# Patient Record
Sex: Female | Born: 1955 | Race: White | Hispanic: No | Smoking: Current some day smoker
Health system: Southern US, Community
[De-identification: ages and names within clinical notes are randomized; demographics above are authoritative.]

## PROBLEM LIST (undated history)

## (undated) DIAGNOSIS — J449 Chronic obstructive pulmonary disease, unspecified: Secondary | ICD-10-CM

## (undated) DIAGNOSIS — K219 Gastro-esophageal reflux disease without esophagitis: Secondary | ICD-10-CM

## (undated) DIAGNOSIS — M797 Fibromyalgia: Secondary | ICD-10-CM

## (undated) DIAGNOSIS — M069 Rheumatoid arthritis, unspecified: Secondary | ICD-10-CM

## (undated) DIAGNOSIS — I219 Acute myocardial infarction, unspecified: Secondary | ICD-10-CM

## (undated) DIAGNOSIS — M1990 Unspecified osteoarthritis, unspecified site: Secondary | ICD-10-CM

## (undated) DIAGNOSIS — R413 Other amnesia: Secondary | ICD-10-CM

## (undated) DIAGNOSIS — E785 Hyperlipidemia, unspecified: Secondary | ICD-10-CM

## (undated) DIAGNOSIS — M810 Age-related osteoporosis without current pathological fracture: Secondary | ICD-10-CM

## (undated) DIAGNOSIS — I2510 Atherosclerotic heart disease of native coronary artery without angina pectoris: Secondary | ICD-10-CM

## (undated) DIAGNOSIS — M5481 Occipital neuralgia: Secondary | ICD-10-CM

## (undated) DIAGNOSIS — G9340 Encephalopathy, unspecified: Secondary | ICD-10-CM

## (undated) DIAGNOSIS — J45909 Unspecified asthma, uncomplicated: Secondary | ICD-10-CM

## (undated) DIAGNOSIS — F4310 Post-traumatic stress disorder, unspecified: Secondary | ICD-10-CM

## (undated) DIAGNOSIS — F419 Anxiety disorder, unspecified: Secondary | ICD-10-CM

## (undated) DIAGNOSIS — M47816 Spondylosis without myelopathy or radiculopathy, lumbar region: Secondary | ICD-10-CM

## (undated) DIAGNOSIS — I10 Essential (primary) hypertension: Secondary | ICD-10-CM

## (undated) DIAGNOSIS — S72031A Displaced midcervical fracture of right femur, initial encounter for closed fracture: Secondary | ICD-10-CM

## (undated) DIAGNOSIS — R0600 Dyspnea, unspecified: Secondary | ICD-10-CM

## (undated) DIAGNOSIS — F32A Depression, unspecified: Secondary | ICD-10-CM

## (undated) DIAGNOSIS — G894 Chronic pain syndrome: Secondary | ICD-10-CM

## (undated) DIAGNOSIS — T7840XA Allergy, unspecified, initial encounter: Secondary | ICD-10-CM

## (undated) DIAGNOSIS — F329 Major depressive disorder, single episode, unspecified: Secondary | ICD-10-CM

## (undated) DIAGNOSIS — M7918 Myalgia, other site: Secondary | ICD-10-CM

## (undated) HISTORY — DX: Major depressive disorder, single episode, unspecified: F32.9

## (undated) HISTORY — DX: Anxiety disorder, unspecified: F41.9

## (undated) HISTORY — DX: Allergy, unspecified, initial encounter: T78.40XA

## (undated) HISTORY — PX: CERVICAL DISC SURGERY: SHX588

## (undated) HISTORY — DX: Hyperlipidemia, unspecified: E78.5

## (undated) HISTORY — DX: Unspecified osteoarthritis, unspecified site: M19.90

## (undated) HISTORY — DX: Depression, unspecified: F32.A

## (undated) HISTORY — DX: Essential (primary) hypertension: I10

## (undated) HISTORY — PX: SPINE SURGERY: SHX786

---

## 1981-10-09 HISTORY — PX: ABDOMINAL HYSTERECTOMY: SHX81

## 1981-10-09 HISTORY — PX: CHOLECYSTECTOMY: SHX55

## 2017-12-14 DIAGNOSIS — F331 Major depressive disorder, recurrent, moderate: Secondary | ICD-10-CM | POA: Insufficient documentation

## 2017-12-14 DIAGNOSIS — M797 Fibromyalgia: Secondary | ICD-10-CM | POA: Insufficient documentation

## 2017-12-14 DIAGNOSIS — I10 Essential (primary) hypertension: Secondary | ICD-10-CM | POA: Insufficient documentation

## 2017-12-14 DIAGNOSIS — M069 Rheumatoid arthritis, unspecified: Secondary | ICD-10-CM | POA: Insufficient documentation

## 2017-12-14 DIAGNOSIS — M0579 Rheumatoid arthritis with rheumatoid factor of multiple sites without organ or systems involvement: Secondary | ICD-10-CM | POA: Insufficient documentation

## 2017-12-14 DIAGNOSIS — E782 Mixed hyperlipidemia: Secondary | ICD-10-CM | POA: Insufficient documentation

## 2017-12-14 DIAGNOSIS — M542 Cervicalgia: Secondary | ICD-10-CM | POA: Insufficient documentation

## 2017-12-14 DIAGNOSIS — F4310 Post-traumatic stress disorder, unspecified: Secondary | ICD-10-CM | POA: Insufficient documentation

## 2017-12-25 ENCOUNTER — Ambulatory Visit: Payer: Medicare Other | Admitting: Nurse Practitioner

## 2018-01-02 ENCOUNTER — Ambulatory Visit: Payer: Medicare Other | Attending: Nurse Practitioner | Admitting: Nurse Practitioner

## 2018-01-02 ENCOUNTER — Encounter: Payer: Self-pay | Admitting: Nurse Practitioner

## 2018-01-02 ENCOUNTER — Other Ambulatory Visit: Payer: Self-pay

## 2018-01-02 VITALS — BP 180/94 | HR 65 | Temp 98.1°F | Resp 16 | Ht 65.0 in | Wt 167.8 lb

## 2018-01-02 DIAGNOSIS — R52 Pain, unspecified: Secondary | ICD-10-CM | POA: Diagnosis not present

## 2018-01-02 DIAGNOSIS — Z79899 Other long term (current) drug therapy: Secondary | ICD-10-CM

## 2018-01-02 DIAGNOSIS — F339 Major depressive disorder, recurrent, unspecified: Secondary | ICD-10-CM | POA: Insufficient documentation

## 2018-01-02 DIAGNOSIS — Z888 Allergy status to other drugs, medicaments and biological substances status: Secondary | ICD-10-CM | POA: Diagnosis not present

## 2018-01-02 DIAGNOSIS — M797 Fibromyalgia: Secondary | ICD-10-CM | POA: Diagnosis not present

## 2018-01-02 DIAGNOSIS — Z79891 Long term (current) use of opiate analgesic: Secondary | ICD-10-CM

## 2018-01-02 DIAGNOSIS — M25561 Pain in right knee: Secondary | ICD-10-CM | POA: Diagnosis present

## 2018-01-02 DIAGNOSIS — M546 Pain in thoracic spine: Secondary | ICD-10-CM | POA: Diagnosis not present

## 2018-01-02 DIAGNOSIS — M545 Low back pain: Secondary | ICD-10-CM | POA: Insufficient documentation

## 2018-01-02 DIAGNOSIS — E785 Hyperlipidemia, unspecified: Secondary | ICD-10-CM | POA: Insufficient documentation

## 2018-01-02 DIAGNOSIS — M069 Rheumatoid arthritis, unspecified: Secondary | ICD-10-CM | POA: Insufficient documentation

## 2018-01-02 DIAGNOSIS — M542 Cervicalgia: Secondary | ICD-10-CM | POA: Insufficient documentation

## 2018-01-02 DIAGNOSIS — F17210 Nicotine dependence, cigarettes, uncomplicated: Secondary | ICD-10-CM | POA: Diagnosis not present

## 2018-01-02 DIAGNOSIS — M25532 Pain in left wrist: Secondary | ICD-10-CM | POA: Insufficient documentation

## 2018-01-02 DIAGNOSIS — M899 Disorder of bone, unspecified: Secondary | ICD-10-CM

## 2018-01-02 DIAGNOSIS — M5481 Occipital neuralgia: Secondary | ICD-10-CM

## 2018-01-02 DIAGNOSIS — I129 Hypertensive chronic kidney disease with stage 1 through stage 4 chronic kidney disease, or unspecified chronic kidney disease: Secondary | ICD-10-CM | POA: Insufficient documentation

## 2018-01-02 DIAGNOSIS — M25531 Pain in right wrist: Secondary | ICD-10-CM | POA: Insufficient documentation

## 2018-01-02 DIAGNOSIS — F4310 Post-traumatic stress disorder, unspecified: Secondary | ICD-10-CM | POA: Insufficient documentation

## 2018-01-02 DIAGNOSIS — Z7982 Long term (current) use of aspirin: Secondary | ICD-10-CM | POA: Diagnosis not present

## 2018-01-02 DIAGNOSIS — R51 Headache: Secondary | ICD-10-CM | POA: Insufficient documentation

## 2018-01-02 DIAGNOSIS — G894 Chronic pain syndrome: Secondary | ICD-10-CM | POA: Diagnosis not present

## 2018-01-02 DIAGNOSIS — G8929 Other chronic pain: Secondary | ICD-10-CM

## 2018-01-02 DIAGNOSIS — Z789 Other specified health status: Secondary | ICD-10-CM | POA: Diagnosis not present

## 2018-01-02 DIAGNOSIS — M549 Dorsalgia, unspecified: Secondary | ICD-10-CM

## 2018-01-02 NOTE — Progress Notes (Signed)
Safety precautions to be maintained throughout the outpatient stay will include: orient to surroundings, keep bed in low position, maintain call bell within reach at all times, provide assistance with transfer out of bed and ambulation.  

## 2018-01-02 NOTE — Patient Instructions (Addendum)
You must complete med/psych evaluation prior to next appointment with pain clinic.____________________________________________________________________________________________  Appointment Policy Summary  It is our goal and responsibility to provide the medical community with assistance in the evaluation and management of patients with chronic pain. Unfortunately our resources are limited. Because we do not have an unlimited amount of time, or available appointments, we are required to closely monitor and manage their use. The following rules exist to maximize their use:  Patient's responsibilities: 1. Punctuality:  At what time should I arrive? You should be physically present in our office 30 minutes before your scheduled appointment. Your scheduled appointment is with your assigned healthcare provider. However, it takes 5-10 minutes to be "checked-in", and another 15 minutes for the nurses to do the admission. If you arrive to our office at the time you were given for your appointment, you will end up being at least 20-25 minutes late to your appointment with the provider. 2. Tardiness:  What happens if I arrive only a few minutes after my scheduled appointment time? You will need to reschedule your appointment. The cutoff is your appointment time. This is why it is so important that you arrive at least 30 minutes before that appointment. If you have an appointment scheduled for 10:00 AM and you arrive at 10:01, you will be required to reschedule your appointment.  3. Plan ahead:  Always assume that you will encounter traffic on your way in. Plan for it. If you are dependent on a driver, make sure they understand these rules and the need to arrive early. 4. Other appointments and responsibilities:  Avoid scheduling any other appointments before or after your pain clinic appointments.  5. Be prepared:  Write down everything that you need to discuss with your healthcare provider and give this  information to the admitting nurse. Write down the medications that you will need refilled. Bring your pills and bottles (even the empty ones), to all of your appointments, except for those where a procedure is scheduled. 6. No children or pets:  Find someone to take care of them. It is not appropriate to bring them in. 7. Scheduling changes:  We request "advanced notification" of any changes or cancellations. 8. Advanced notification:  Defined as a time period of more than 24 hours prior to the originally scheduled appointment. This allows for the appointment to be offered to other patients. 9. Rescheduling:  When a visit is rescheduled, it will require the cancellation of the original appointment. For this reason they both fall within the category of "Cancellations".  10. Cancellations:  They require advanced notification. Any cancellation less than 24 hours before the  appointment will be recorded as a "No Show". 11. No Show:  Defined as an unkept appointment where the patient failed to notify or declare to the practice their intention or inability to keep the appointment.  Corrective process for repeat offenders:  1. Tardiness: Three (3) episodes of rescheduling due to late arrivals will be recorded as one (1) "No Show". 2. Cancellation or reschedule: Three (3) cancellations or rescheduling will be recorded as one (1) "No Show". 3. "No Shows": Three (3) "No Shows" within a 12 month period will result in discharge from the practice. ____________________________________________________________________________________________  ____________________________________________________________________________________________  Pain Scale  Introduction: The pain score used by this practice is the Verbal Numerical Rating Scale (VNRS-11). This is an 11-point scale. It is for adults and children 10 years or older. There are significant differences in how the pain score is reported, used,  and applied.  Forget everything you learned in the past and learn this scoring system.  General Information: The scale should reflect your current level of pain. Unless you are specifically asked for the level of your worst pain, or your average pain. If you are asked for one of these two, then it should be understood that it is over the past 24 hours.  Basic Activities of Daily Living (ADL): Personal hygiene, dressing, eating, transferring, and using restroom.  Instructions: Most patients tend to report their level of pain as a combination of two factors, their physical pain and their psychosocial pain. This last one is also known as "suffering" and it is reflection of how physical pain affects you socially and psychologically. From now on, report them separately. From this point on, when asked to report your pain level, report only your physical pain. Use the following table for reference.  Pain Clinic Pain Levels (0-5/10)  Pain Level Score  Description  No Pain 0   Mild pain 1 Nagging, annoying, but does not interfere with basic activities of daily living (ADL). Patients are able to eat, bathe, get dressed, toileting (being able to get on and off the toilet and perform personal hygiene functions), transfer (move in and out of bed or a chair without assistance), and maintain continence (able to control bladder and bowel functions). Blood pressure and heart rate are unaffected. A normal heart rate for a healthy adult ranges from 60 to 100 bpm (beats per minute).   Mild to moderate pain 2 Noticeable and distracting. Impossible to hide from other people. More frequent flare-ups. Still possible to adapt and function close to normal. It can be very annoying and may have occasional stronger flare-ups. With discipline, patients may get used to it and adapt.   Moderate pain 3 Interferes significantly with activities of daily living (ADL). It becomes difficult to feed, bathe, get dressed, get on and off the toilet or to  perform personal hygiene functions. Difficult to get in and out of bed or a chair without assistance. Very distracting. With effort, it can be ignored when deeply involved in activities.   Moderately severe pain 4 Impossible to ignore for more than a few minutes. With effort, patients may still be able to manage work or participate in some social activities. Very difficult to concentrate. Signs of autonomic nervous system discharge are evident: dilated pupils (mydriasis); mild sweating (diaphoresis); sleep interference. Heart rate becomes elevated (>115 bpm). Diastolic blood pressure (lower number) rises above 100 mmHg. Patients find relief in laying down and not moving.   Severe pain 5 Intense and extremely unpleasant. Associated with frowning face and frequent crying. Pain overwhelms the senses.  Ability to do any activity or maintain social relationships becomes significantly limited. Conversation becomes difficult. Pacing back and forth is common, as getting into a comfortable position is nearly impossible. Pain wakes you up from deep sleep. Physical signs will be obvious: pupillary dilation; increased sweating; goosebumps; brisk reflexes; cold, clammy hands and feet; nausea, vomiting or dry heaves; loss of appetite; significant sleep disturbance with inability to fall asleep or to remain asleep. When persistent, significant weight loss is observed due to the complete loss of appetite and sleep deprivation.  Blood pressure and heart rate becomes significantly elevated. Caution: If elevated blood pressure triggers a pounding headache associated with blurred vision, then the patient should immediately seek attention at an urgent or emergency care unit, as these may be signs of an impending stroke.  Emergency Department Pain Levels (6-10/10)  Emergency Room Pain 6 Severely limiting. Requires emergency care and should not be seen or managed at an outpatient pain management facility. Communication becomes  difficult and requires great effort. Assistance to reach the emergency department may be required. Facial flushing and profuse sweating along with potentially dangerous increases in heart rate and blood pressure will be evident.   Distressing pain 7 Self-care is very difficult. Assistance is required to transport, or use restroom. Assistance to reach the emergency department will be required. Tasks requiring coordination, such as bathing and getting dressed become very difficult.   Disabling pain 8 Self-care is no longer possible. At this level, pain is disabling. The individual is unable to do even the most "basic" activities such as walking, eating, bathing, dressing, transferring to a bed, or toileting. Fine motor skills are lost. It is difficult to think clearly.   Incapacitating pain 9 Pain becomes incapacitating. Thought processing is no longer possible. Difficult to remember your own name. Control of movement and coordination are lost.   The worst pain imaginable 10 At this level, most patients pass out from pain. When this level is reached, collapse of the autonomic nervous system occurs, leading to a sudden drop in blood pressure and heart rate. This in turn results in a temporary and dramatic drop in blood flow to the brain, leading to a loss of consciousness. Fainting is one of the body's self defense mechanisms. Passing out puts the brain in a calmed state and causes it to shut down for a while, in order to begin the healing process.    Summary: 1. Refer to this scale when providing [place] with your pain level. 2. Be accurate and careful when reporting your pain level. This will help with your care. 3. Over-reporting your pain level will lead to loss of credibility. 4. Even a level of 1/10 means that there is pain and will be treated at our facility. 5. High, inaccurate reporting will be documented as "Symptom Exaggeration", leading to loss of credibility and suspicions of possible secondary  gains such as obtaining more narcotics, or wanting to appear disabled, for fraudulent reasons. 6. Only pain levels of 5 or below will be seen at our facility. 7. Pain levels of 6 and above will be sent to the Emergency Department and the appointment cancelled. ____________________________________________________________________________________________

## 2018-01-02 NOTE — Progress Notes (Signed)
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 w/ Dr. Dossie Arbour, after [employer] eval, medical record release.  No future appointments.  Primary Care Physician: Marinda Elk, MD Location: [facility] Note by:  Date: 01/02/2018; Time: 3:39 PM  Pain Score Disclaimer: We use the NRS-11 scale. This is a self-reported, subjective measurement of pain severity with only modest accuracy. It is used primarily to identify changes within a particular patient. It must be understood that outpatient pain scales are significantly less accurate that those used for research, where they can be applied under ideal controlled circumstances with minimal exposure to variables. In reality, the score is likely to be a combination of pain intensity and pain affect, where pain affect describes the degree of emotional arousal or changes in action readiness caused by the sensory experience of pain. Factors such as social and work situation, setting, emotional state, anxiety levels, expectation, and prior pain experience may influence pain perception and show large inter-individual differences that may also be affected by time variables.  Patient instructions provided during this appointment: Patient Instructions   You must complete med/psych evaluation prior to next appointment with pain clinic.____________________________________________________________________________________________  Appointment Policy Summary  It is  our goal and responsibility to provide the medical community with assistance in the evaluation and management of patients with chronic pain. Unfortunately our resources are limited. Because we do not have an unlimited amount of time, or available appointments, we are required to closely monitor and manage their use. The following rules exist to maximize their use:  Patient's responsibilities: 1. Punctuality:  At what time should I arrive? You should be physically present in our office 30 minutes before your scheduled appointment. Your scheduled appointment is with your assigned healthcare provider. However, it takes 5-10 minutes to be "checked-in", and another 15 minutes for the nurses to do the admission. If you arrive to our office at the time you were given for your appointment, you will end up being at least 20-25 minutes late to your appointment with the provider. 2. Tardiness:  What happens if I arrive only a few minutes after my scheduled appointment time? You will need to reschedule your appointment. The cutoff is your  appointment time. This is why it is so important that you arrive at least 30 minutes before that appointment. If you have an appointment scheduled for 10:00 AM and you arrive at 10:01, you will be required to reschedule your appointment.  3. Plan ahead:  Always assume that you will encounter traffic on your way in. Plan for it. If you are dependent on a driver, make sure they understand these rules and the need to arrive early. 4. Other appointments and responsibilities:  Avoid scheduling any other appointments before or after your pain clinic appointments.  5. Be prepared:  Write down everything that you need to discuss with your healthcare provider and give this information to the admitting nurse. Write down the medications that you will need refilled. Bring your pills and bottles (even the empty ones), to all of your appointments, except for those where a procedure is  scheduled. 6. No children or pets:  Find someone to take care of them. It is not appropriate to bring them in. 7. Scheduling changes:  We request "advanced notification" of any changes or cancellations. 8. Advanced notification:  Defined as a time period of more than 24 hours prior to the originally scheduled appointment. This allows for the appointment to be offered to other patients. 9. Rescheduling:  When a visit is rescheduled, it will require the cancellation of the original appointment. For this reason they both fall within the category of "Cancellations".  10. Cancellations:  They require advanced notification. Any cancellation less than 24 hours before the  appointment will be recorded as a "No Show". 11. No Show:  Defined as an unkept appointment where the patient failed to notify or declare to the practice their intention or inability to keep the appointment.  Corrective process for repeat offenders:  1. Tardiness: Three (3) episodes of rescheduling due to late arrivals will be recorded as one (1) "No Show". 2. Cancellation or reschedule: Three (3) cancellations or rescheduling will be recorded as one (1) "No Show". 3. "No Shows": Three (3) "No Shows" within a 12 month period will result in discharge from the practice. ____________________________________________________________________________________________  ____________________________________________________________________________________________  Pain Scale  Introduction: The pain score used by this practice is the Verbal Numerical Rating Scale (VNRS-11). This is an 11-point scale. It is for adults and children 10 years or older. There are significant differences in how the pain score is reported, used, and applied. Forget everything you learned in the past and learn this scoring system.  General Information: The scale should reflect your current level of pain. Unless you are specifically asked for the level of your worst  pain, or your average pain. If you are asked for one of these two, then it should be understood that it is over the past 24 hours.  Basic Activities of Daily Living (ADL): Personal hygiene, dressing, eating, transferring, and using restroom.  Instructions: Most patients tend to report their level of pain as a combination of two factors, their physical pain and their psychosocial pain. This last one is also known as "suffering" and it is reflection of how physical pain affects you socially and psychologically. From now on, report them separately. From this point on, when asked to report your pain level, report only your physical pain. Use the following table for reference.  Pain Clinic Pain Levels (0-5/10)  Pain Level Score  Description  No Pain 0   Mild pain 1 Nagging, annoying, but does not interfere with basic activities of daily living (ADL). Patients are  able to eat, bathe, get dressed, toileting (being able to get on and off the toilet and perform personal hygiene functions), transfer (move in and out of bed or a chair without assistance), and maintain continence (able to control bladder and bowel functions). Blood pressure and heart rate are unaffected. A normal heart rate for a healthy adult ranges from 60 to 100 bpm (beats per minute).   Mild to moderate pain 2 Noticeable and distracting. Impossible to hide from other people. More frequent flare-ups. Still possible to adapt and function close to normal. It can be very annoying and may have occasional stronger flare-ups. With discipline, patients may get used to it and adapt.   Moderate pain 3 Interferes significantly with activities of daily living (ADL). It becomes difficult to feed, bathe, get dressed, get on and off the toilet or to perform personal hygiene functions. Difficult to get in and out of bed or a chair without assistance. Very distracting. With effort, it can be ignored when deeply involved in activities.   Moderately severe pain  4 Impossible to ignore for more than a few minutes. With effort, patients may still be able to manage work or participate in some social activities. Very difficult to concentrate. Signs of autonomic nervous system discharge are evident: dilated pupils (mydriasis); mild sweating (diaphoresis); sleep interference. Heart rate becomes elevated (>115 bpm). Diastolic blood pressure (lower number) rises above 100 mmHg. Patients find relief in laying down and not moving.   Severe pain 5 Intense and extremely unpleasant. Associated with frowning face and frequent crying. Pain overwhelms the senses.  Ability to do any activity or maintain social relationships becomes significantly limited. Conversation becomes difficult. Pacing back and forth is common, as getting into a comfortable position is nearly impossible. Pain wakes you up from deep sleep. Physical signs will be obvious: pupillary dilation; increased sweating; goosebumps; brisk reflexes; cold, clammy hands and feet; nausea, vomiting or dry heaves; loss of appetite; significant sleep disturbance with inability to fall asleep or to remain asleep. When persistent, significant weight loss is observed due to the complete loss of appetite and sleep deprivation.  Blood pressure and heart rate becomes significantly elevated. Caution: If elevated blood pressure triggers a pounding headache associated with blurred vision, then the patient should immediately seek attention at an urgent or emergency care unit, as these may be signs of an impending stroke.    Emergency Department Pain Levels (6-10/10)  Emergency Room Pain 6 Severely limiting. Requires emergency care and should not be seen or managed at an outpatient pain management facility. Communication becomes difficult and requires great effort. Assistance to reach the emergency department may be required. Facial flushing and profuse sweating along with potentially dangerous increases in heart rate and blood pressure  will be evident.   Distressing pain 7 Self-care is very difficult. Assistance is required to transport, or use restroom. Assistance to reach the emergency department will be required. Tasks requiring coordination, such as bathing and getting dressed become very difficult.   Disabling pain 8 Self-care is no longer possible. At this level, pain is disabling. The individual is unable to do even the most "basic" activities such as walking, eating, bathing, dressing, transferring to a bed, or toileting. Fine motor skills are lost. It is difficult to think clearly.   Incapacitating pain 9 Pain becomes incapacitating. Thought processing is no longer possible. Difficult to remember your own name. Control of movement and coordination are lost.   The worst pain imaginable 10 At this level,  most patients pass out from pain. When this level is reached, collapse of the autonomic nervous system occurs, leading to a sudden drop in blood pressure and heart rate. This in turn results in a temporary and dramatic drop in blood flow to the brain, leading to a loss of consciousness. Fainting is one of the body's self defense mechanisms. Passing out puts the brain in a calmed state and causes it to shut down for a while, in order to begin the healing process.    Summary: 1. Refer to this scale when providing [place] with your pain level. 2. Be accurate and careful when reporting your pain level. This will help with your care. 3. Over-reporting your pain level will lead to loss of credibility. 4. Even a level of 1/10 means that there is pain and will be treated at our facility. 5. High, inaccurate reporting will be documented as "Symptom Exaggeration", leading to loss of credibility and suspicions of possible secondary gains such as obtaining more narcotics, or wanting to appear disabled, for fraudulent reasons. 6. Only pain levels of 5 or below will be seen at our facility. 7. Pain levels of 6 and above will be sent to the  Emergency Department and the appointment cancelled. ____________________________________________________________________________________________

## 2018-01-07 LAB — COMPLIANCE DRUG ANALYSIS, UR

## 2018-01-09 LAB — VITAMIN B12: Vitamin B-12: 537 pg/mL (ref 232–1245)

## 2018-01-09 LAB — COMP. METABOLIC PANEL (12)
A/G RATIO: 1.5 (ref 1.2–2.2)
AST: 25 IU/L (ref 0–40)
Albumin: 4.3 g/dL (ref 3.6–4.8)
Alkaline Phosphatase: 66 IU/L (ref 39–117)
BUN / CREAT RATIO: 18 (ref 12–28)
BUN: 16 mg/dL (ref 8–27)
Bilirubin Total: 0.3 mg/dL (ref 0.0–1.2)
CALCIUM: 9.6 mg/dL (ref 8.7–10.3)
CHLORIDE: 98 mmol/L (ref 96–106)
Creatinine, Ser: 0.91 mg/dL (ref 0.57–1.00)
GFR calc Af Amer: 78 mL/min/{1.73_m2} (ref >59)
GFR, EST NON AFRICAN AMERICAN: 67 mL/min/{1.73_m2} (ref >59)
GLOBULIN, TOTAL: 2.9 g/dL (ref 1.5–4.5)
GLUCOSE: 90 mg/dL (ref 65–99)
POTASSIUM: 4 mmol/L (ref 3.5–5.2)
Sodium: 139 mmol/L (ref 134–144)
TOTAL PROTEIN: 7.2 g/dL (ref 6.0–8.5)

## 2018-01-09 LAB — 25-HYDROXY VITAMIN D LCMS D2+D3: 25-Hydroxy, Vitamin D-2: <1 ng/mL

## 2018-01-09 LAB — 25-HYDROXYVITAMIN D LCMS D2+D3
25-HYDROXY, VITAMIN D-3: 23 ng/mL
25-HYDROXY, VITAMIN D: 24 ng/mL — AB

## 2018-01-09 LAB — C-REACTIVE PROTEIN

## 2018-01-09 LAB — SEDIMENTATION RATE: SED RATE: 12 mm/h (ref 0–40)

## 2018-01-09 LAB — MAGNESIUM: Magnesium: 2.2 mg/dL (ref 1.6–2.3)

## 2018-01-15 ENCOUNTER — Other Ambulatory Visit: Payer: Self-pay | Admitting: Physician Assistant

## 2018-01-15 DIAGNOSIS — Z1231 Encounter for screening mammogram for malignant neoplasm of breast: Secondary | ICD-10-CM

## 2018-01-28 ENCOUNTER — Ambulatory Visit
Admission: RE | Admit: 2018-01-28 | Discharge: 2018-01-28 | Disposition: A | Discharge status: Home / Self Care | Payer: Medicare HMO | Source: Ambulatory Visit | Attending: Physician Assistant | Admitting: Physician Assistant

## 2018-01-28 DIAGNOSIS — Z1231 Encounter for screening mammogram for malignant neoplasm of breast: Secondary | ICD-10-CM | POA: Insufficient documentation

## 2018-01-28 IMAGING — MG MM DIGITAL SCREENING BILAT W/ CAD
5 series · 5 of 5 positions shown · non-contrast
Comparison: Previous exam(s).

CLINICAL DATA: Screening.

EXAM:
DIGITAL SCREENING BILATERAL MAMMOGRAM WITH CAD

[L CC]
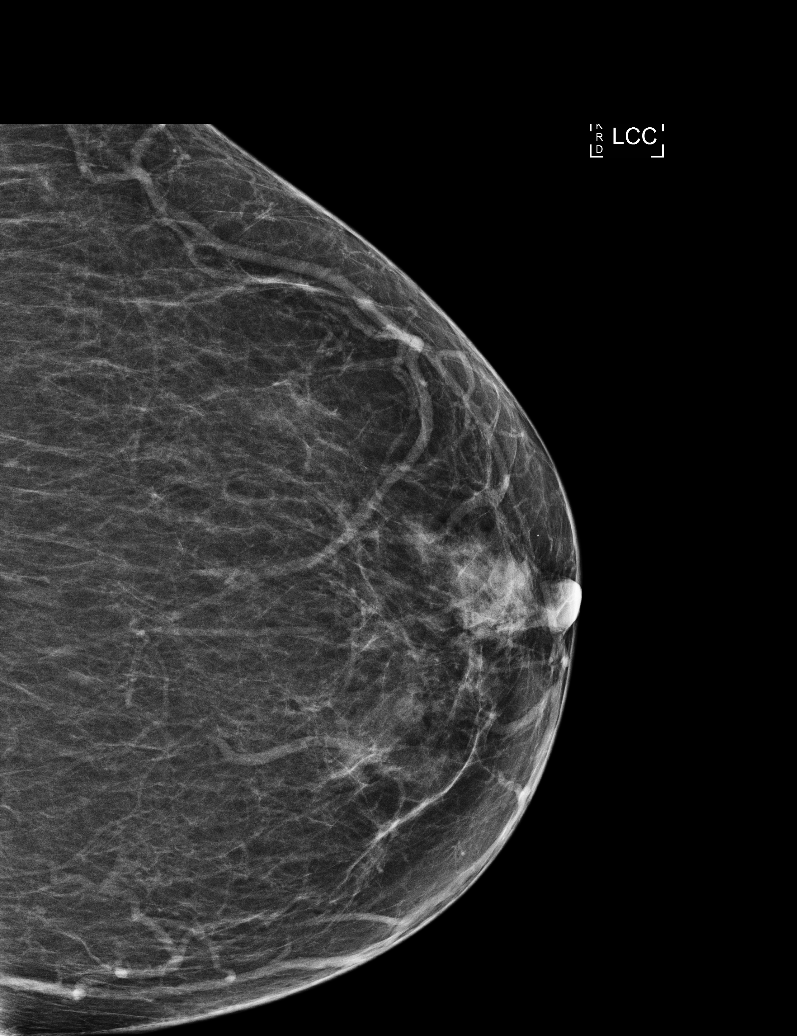

[R CC]
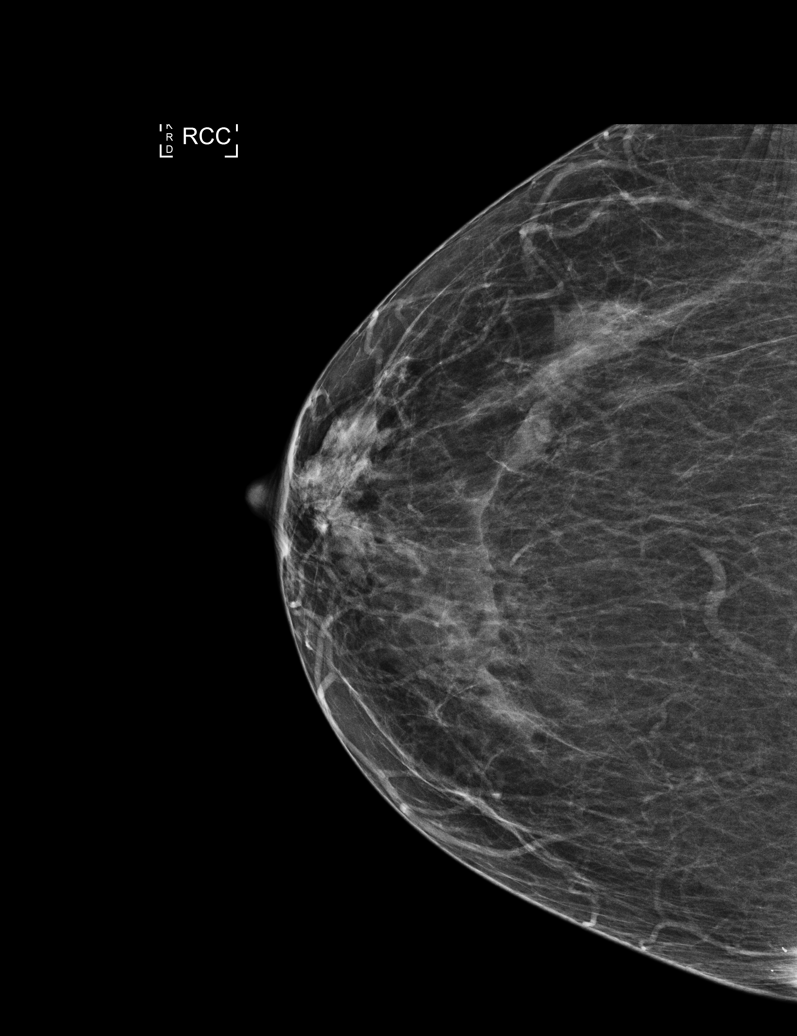

[L MLO]
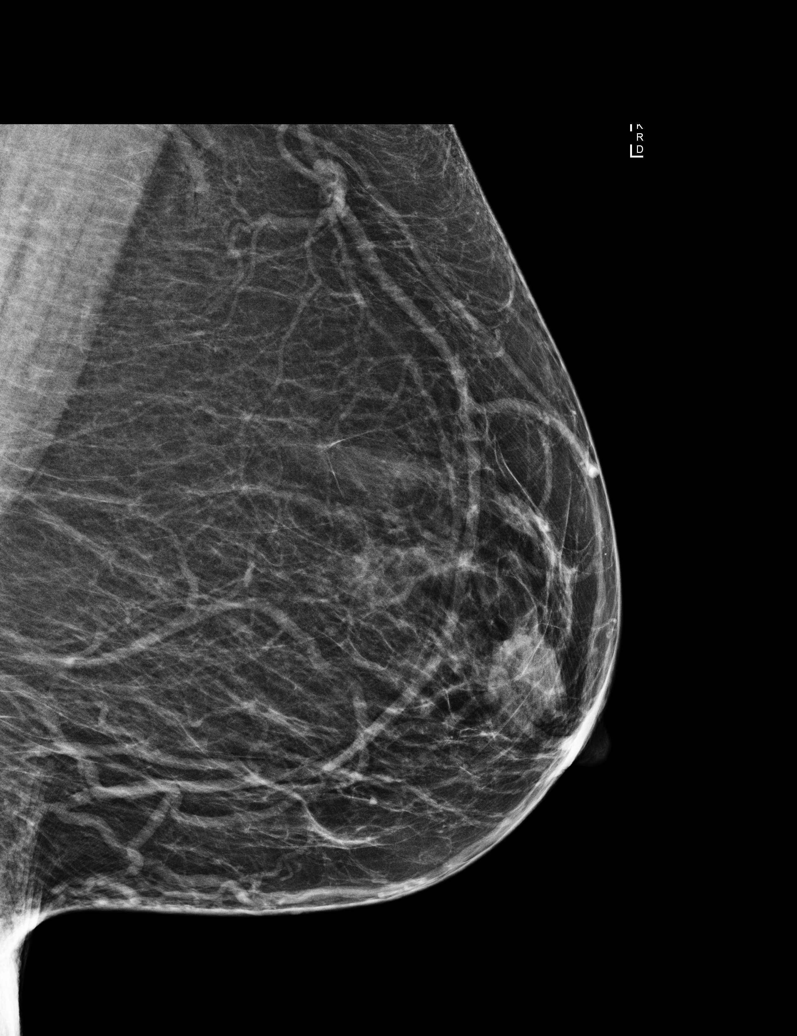

[R XCCL]
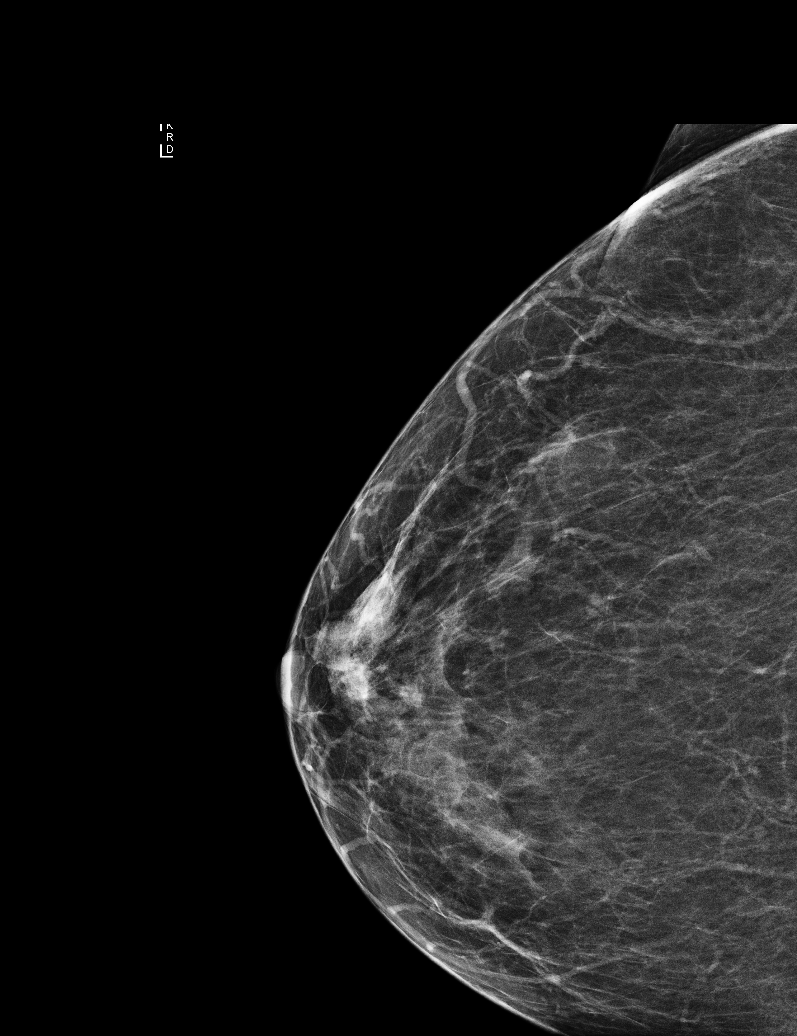

[R MLO]
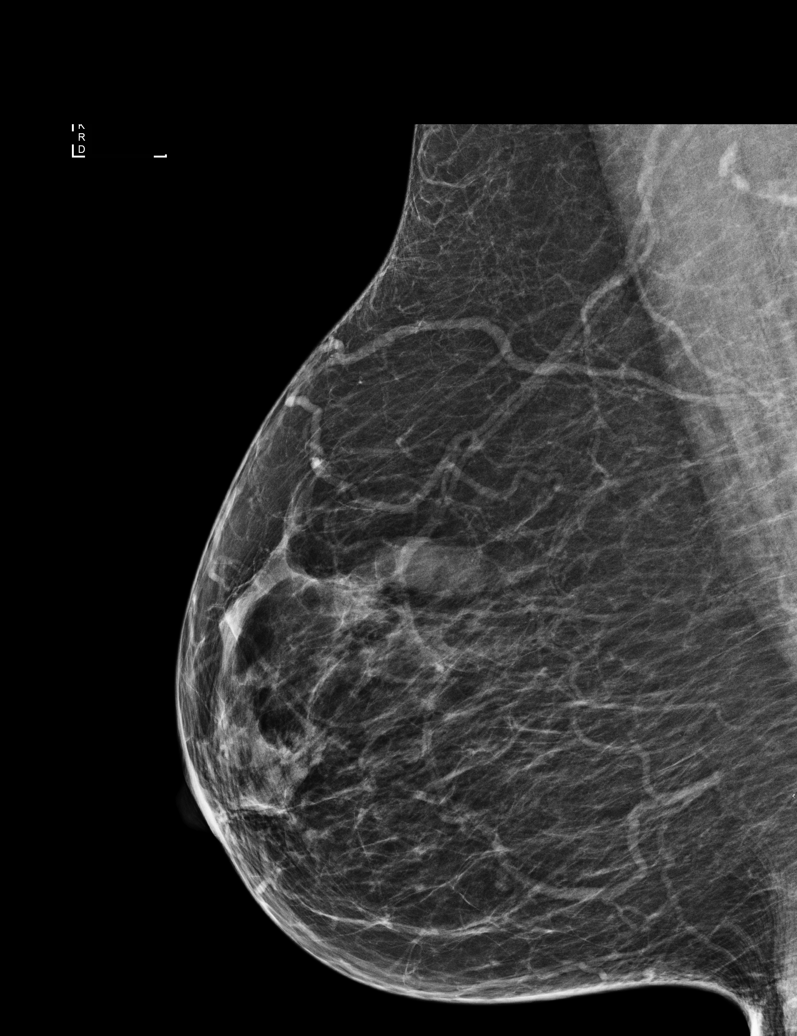

[5 of 5 positions shown; findings below may reference images not displayed]

ACR Breast Density Category b: There are scattered areas of
fibroglandular density.
FINDINGS: There are no findings suspicious for malignancy. Images were
processed with CAD.
IMPRESSION: No mammographic evidence of malignancy. A result letter of this
screening mammogram will be mailed directly to the patient.

RECOMMENDATION:
Screening mammogram in one year. (Code:[US])

BI-RADS CATEGORY  1: Negative.

## 2018-02-08 ENCOUNTER — Other Ambulatory Visit: Payer: Self-pay | Admitting: *Deleted

## 2018-02-08 ENCOUNTER — Inpatient Hospital Stay
Admission: RE | Admit: 2018-02-08 | Discharge: 2018-02-08 | Disposition: A | Discharge status: Home / Self Care | Payer: Self-pay | Source: Ambulatory Visit | Attending: *Deleted | Admitting: *Deleted

## 2018-02-08 DIAGNOSIS — Z9289 Personal history of other medical treatment: Secondary | ICD-10-CM

## 2018-02-15 DIAGNOSIS — Z79899 Other long term (current) drug therapy: Secondary | ICD-10-CM | POA: Insufficient documentation

## 2018-02-18 DIAGNOSIS — M47816 Spondylosis without myelopathy or radiculopathy, lumbar region: Secondary | ICD-10-CM | POA: Insufficient documentation

## 2018-02-18 DIAGNOSIS — M961 Postlaminectomy syndrome, not elsewhere classified: Secondary | ICD-10-CM | POA: Diagnosis present

## 2018-03-21 DIAGNOSIS — M7918 Myalgia, other site: Secondary | ICD-10-CM | POA: Insufficient documentation

## 2018-03-21 DIAGNOSIS — M5481 Occipital neuralgia: Secondary | ICD-10-CM | POA: Insufficient documentation

## 2018-04-15 ENCOUNTER — Emergency Department: Payer: Medicare HMO

## 2018-04-15 ENCOUNTER — Other Ambulatory Visit: Payer: Self-pay

## 2018-04-15 ENCOUNTER — Emergency Department
Admission: EM | Admit: 2018-04-15 | Discharge: 2018-04-15 | Disposition: A | Discharge status: Home / Self Care | Payer: Medicare HMO | Attending: Emergency Medicine | Admitting: Emergency Medicine

## 2018-04-15 ENCOUNTER — Encounter: Payer: Self-pay | Admitting: *Deleted

## 2018-04-15 DIAGNOSIS — F419 Anxiety disorder, unspecified: Secondary | ICD-10-CM

## 2018-04-15 DIAGNOSIS — R0602 Shortness of breath: Secondary | ICD-10-CM | POA: Insufficient documentation

## 2018-04-15 DIAGNOSIS — F17210 Nicotine dependence, cigarettes, uncomplicated: Secondary | ICD-10-CM | POA: Insufficient documentation

## 2018-04-15 DIAGNOSIS — R079 Chest pain, unspecified: Secondary | ICD-10-CM | POA: Diagnosis present

## 2018-04-15 DIAGNOSIS — Z7982 Long term (current) use of aspirin: Secondary | ICD-10-CM | POA: Insufficient documentation

## 2018-04-15 DIAGNOSIS — Z79899 Other long term (current) drug therapy: Secondary | ICD-10-CM | POA: Diagnosis not present

## 2018-04-15 DIAGNOSIS — I10 Essential (primary) hypertension: Secondary | ICD-10-CM | POA: Diagnosis not present

## 2018-04-15 DIAGNOSIS — R0789 Other chest pain: Secondary | ICD-10-CM | POA: Diagnosis not present

## 2018-04-15 LAB — CBC
HCT: 44.7 % (ref 35.0–47.0)
Hemoglobin: 15.8 g/dL (ref 12.0–16.0)
MCH: 32.6 pg (ref 26.0–34.0)
MCHC: 35.3 g/dL (ref 32.0–36.0)
MCV: 92.3 fL (ref 80.0–100.0)
Platelets: 292 K/uL (ref 150–440)
RBC: 4.84 MIL/uL (ref 3.80–5.20)
RDW: 14.2 % (ref 11.5–14.5)
WBC: 8 K/uL (ref 3.6–11.0)

## 2018-04-15 LAB — BASIC METABOLIC PANEL
Anion gap: 9 (ref 5–15)
BUN: 23 mg/dL (ref 8–23)
CALCIUM: 10.3 mg/dL (ref 8.9–10.3)
CO2: 30 mmol/L (ref 22–32)
CREATININE: 1.05 mg/dL — AB (ref 0.44–1.00)
Chloride: 100 mmol/L (ref 98–111)
GFR calc Af Amer: >60 mL/min (ref >60)
GFR calc non Af Amer: 55 mL/min — ABNORMAL LOW (ref >60)
Glucose, Bld: 127 mg/dL — ABNORMAL HIGH (ref 70–99)
Potassium: 3.6 mmol/L (ref 3.5–5.1)
Sodium: 139 mmol/L (ref 135–145)

## 2018-04-15 LAB — TROPONIN I: Troponin I: <0.03 ng/mL

## 2018-04-15 LAB — FIBRIN DERIVATIVES D-DIMER (ARMC ONLY): Fibrin derivatives D-dimer (ARMC): 657.95 ng{FEU}/mL — ABNORMAL HIGH (ref 0.00–499.00)

## 2018-04-15 IMAGING — CR DG CHEST 2V
1 series · 2 of 2 positions shown · non-contrast
Comparison: None.

CLINICAL DATA: Shortness of breath and chest pain

EXAM:
CHEST - 2 VIEW

[Series 1: dg chest 2 view · 0.14mm/px · 2 of 2 slices shown]
[im 1/2]
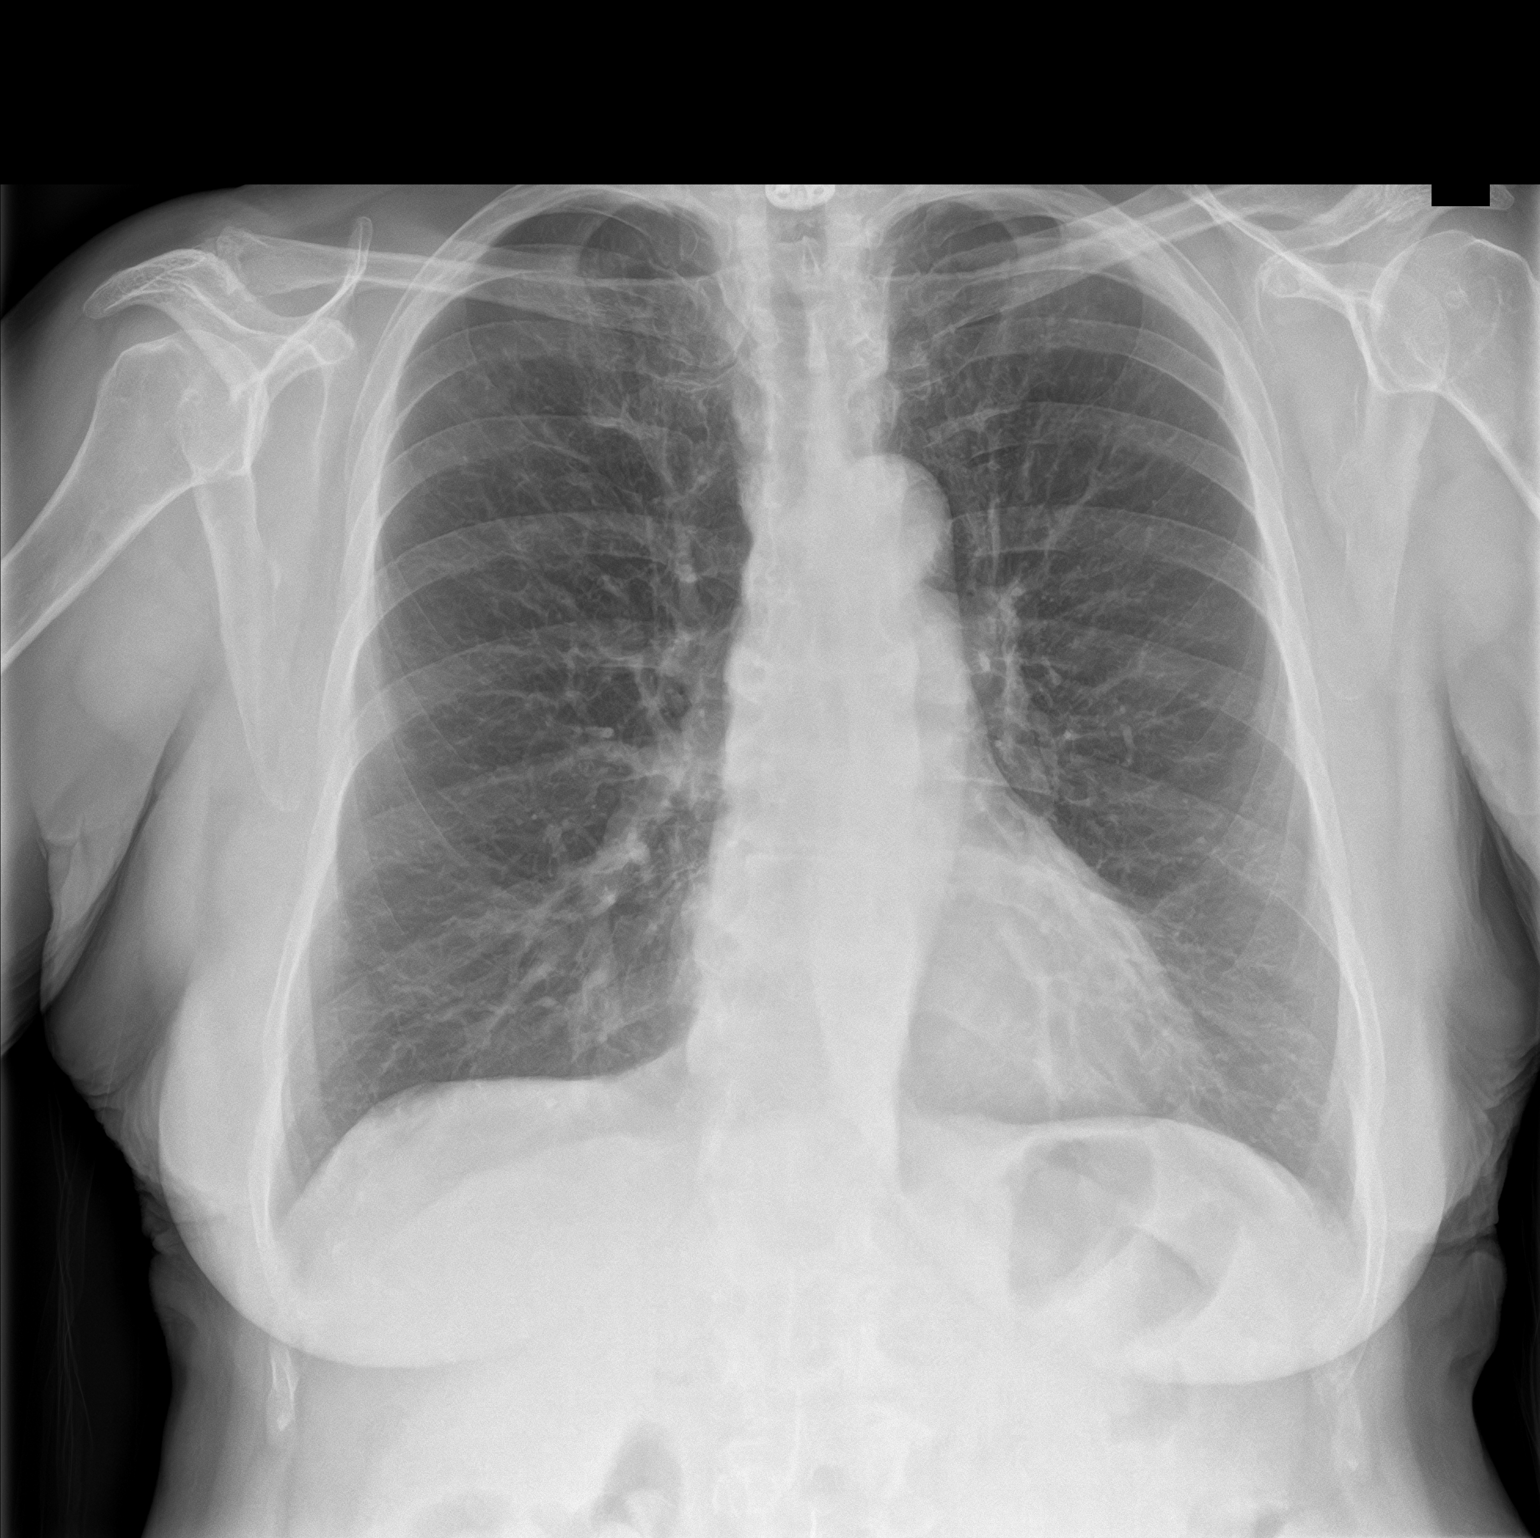
[im 2/2]
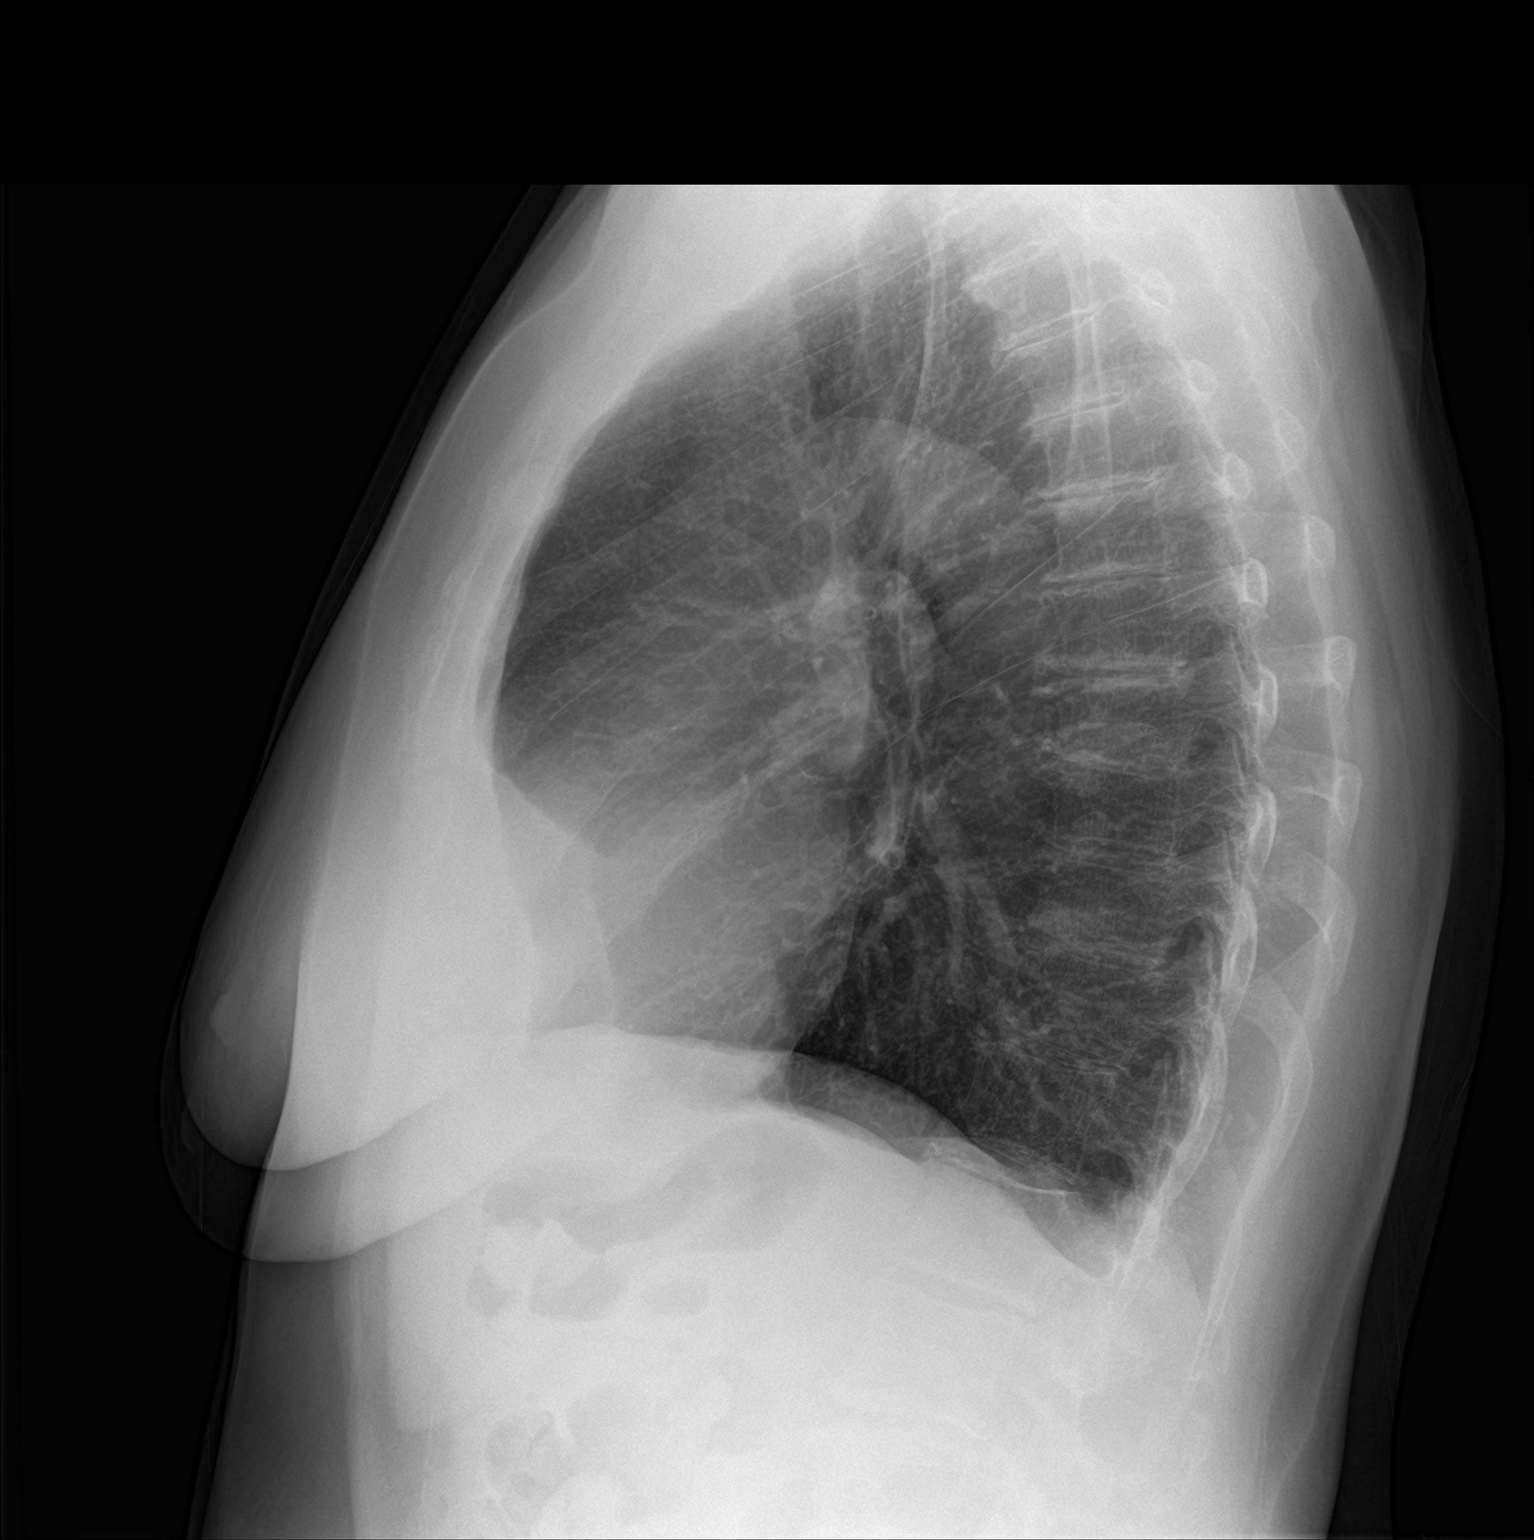

[2 of 2 positions shown; findings below may reference images not displayed]

FINDINGS: Lungs are clear. The heart size and pulmonary vascularity are
normal. No adenopathy. There is postoperative change in the cervical
spine. No pneumothorax.
IMPRESSION: No edema or consolidation.

## 2018-04-15 IMAGING — CT CT ANGIO CHEST
1 of 6 series · 19 of 36 positions shown · IV contrast (APPLIED)
Comparison: Chest two-view [DATE]

CLINICAL DATA: Intermediate probability for pulmonary embolism.
Short of breath and chest pain. Positive D-dimer.

EXAM:
CT ANGIOGRAPHY CHEST WITH CONTRAST
TECHNIQUE: Multidetector CT imaging of the chest was performed using the
standard protocol during bolus administration of intravenous
contrast. Multiplanar CT image reconstructions and MIPs were
obtained to evaluate the vascular anatomy.
CONTRAST:  75mL OMNIPAQUE IOHEXOL 350 MG/ML SOLN

[Series 5: thins · axial · 0.72mm/px · z∈[-376,-105]mm · 19 of 303 slices shown]
[im 16/303  lung]
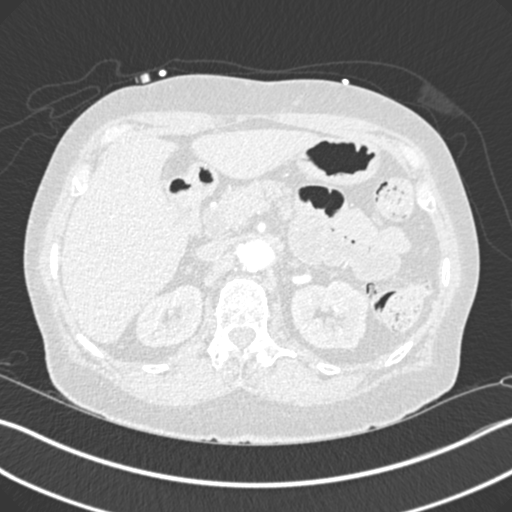
[im 31/303  mediastinal]
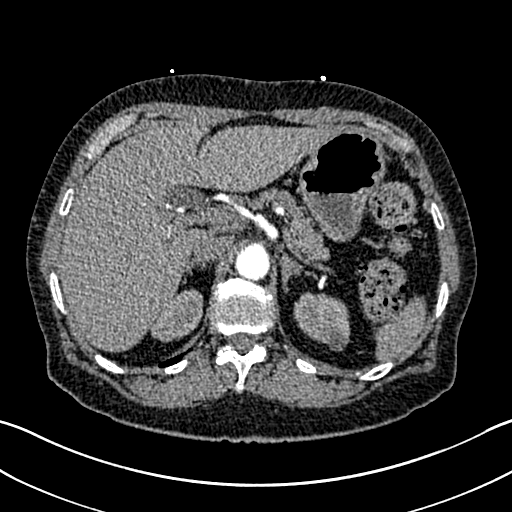
[im 46/303  lung]
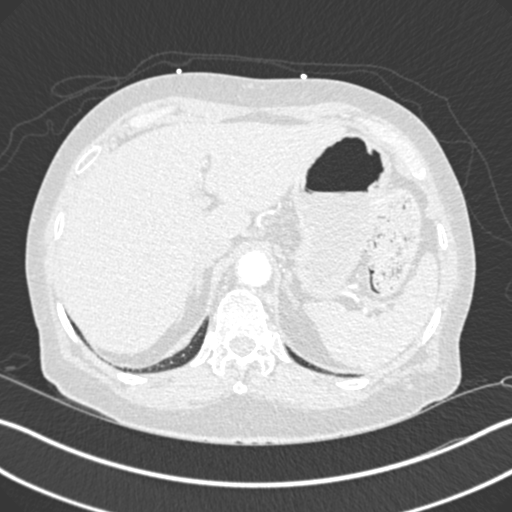
[im 61/303  mediastinal]
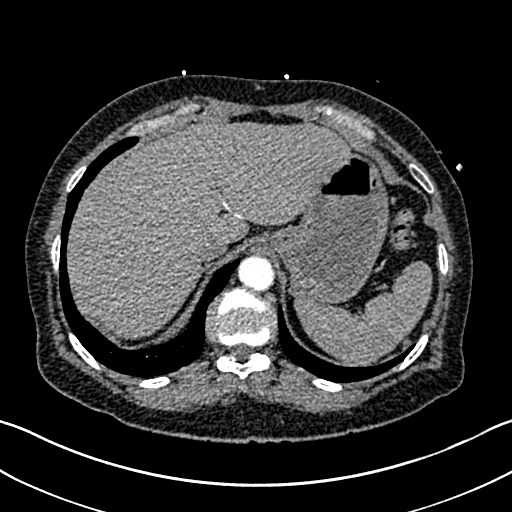
[im 76/303  lung]
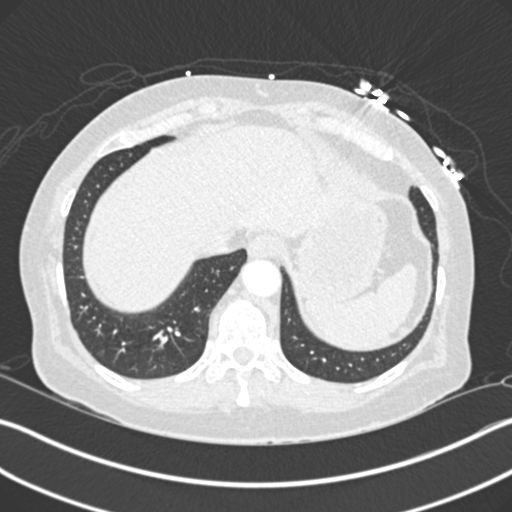
[im 91/303  mediastinal]
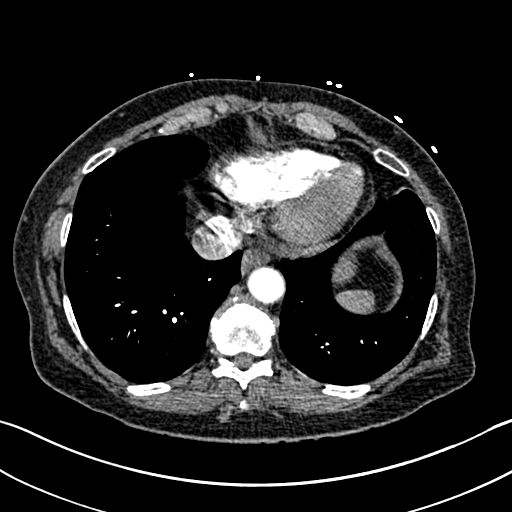
[im 106/303  lung]
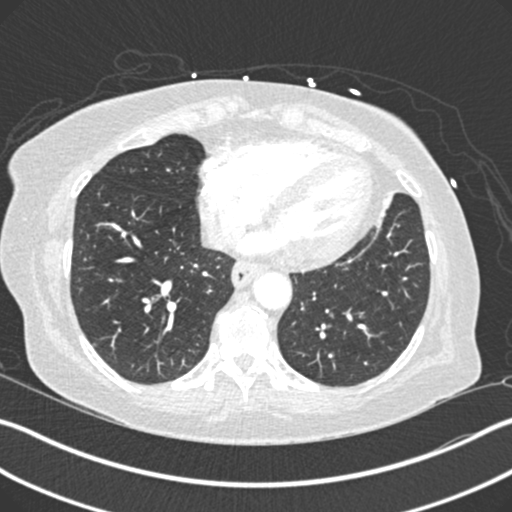
[im 121/303  mediastinal]
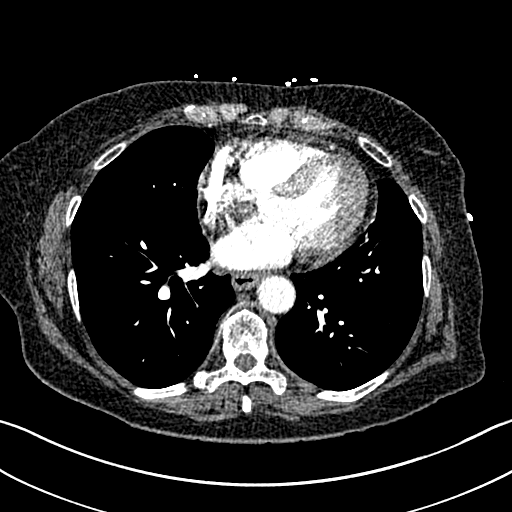
[im 136/303  lung]
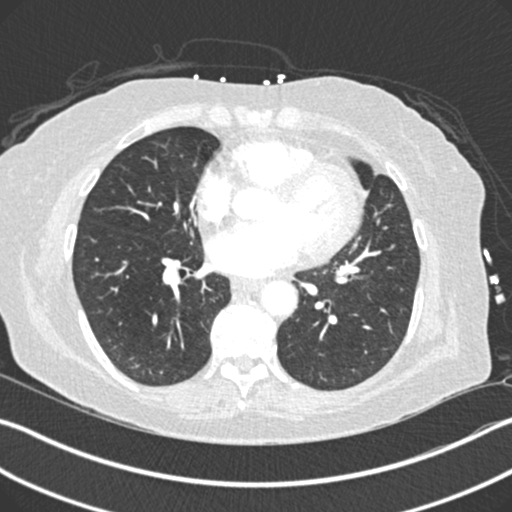
[im 152/303  mediastinal]
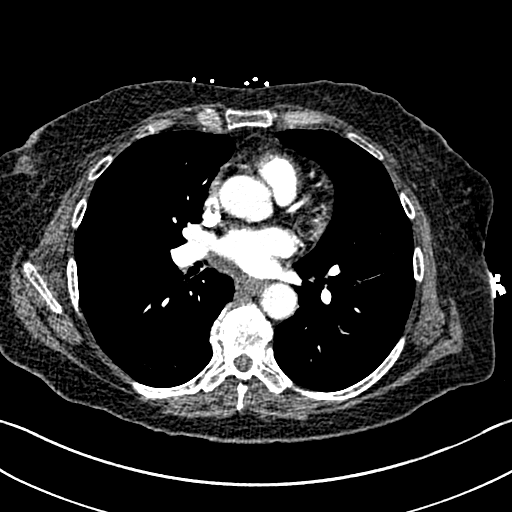
[im 167/303  lung]
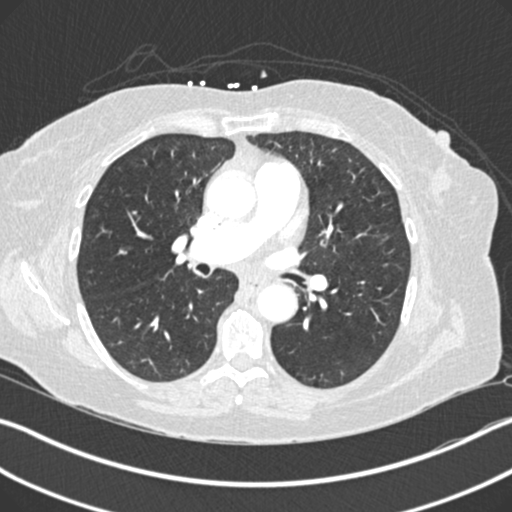
[im 182/303  mediastinal]
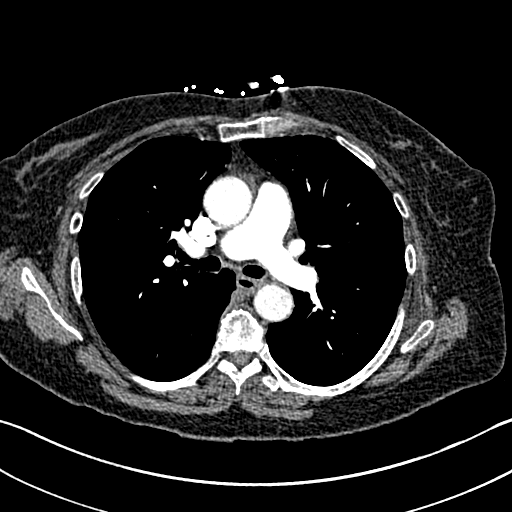
[im 197/303  lung]
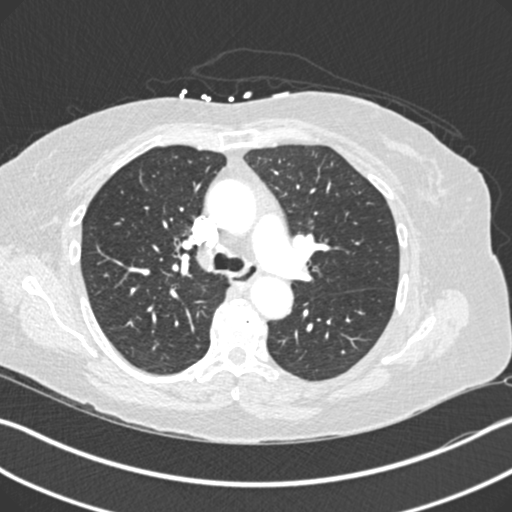
[im 212/303  mediastinal]
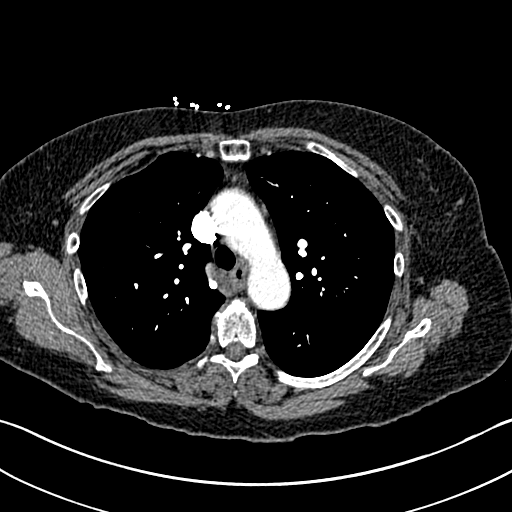
[im 227/303  lung]
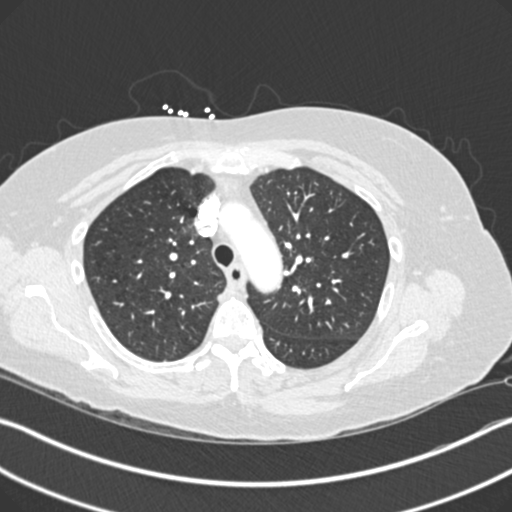
[im 242/303  mediastinal]
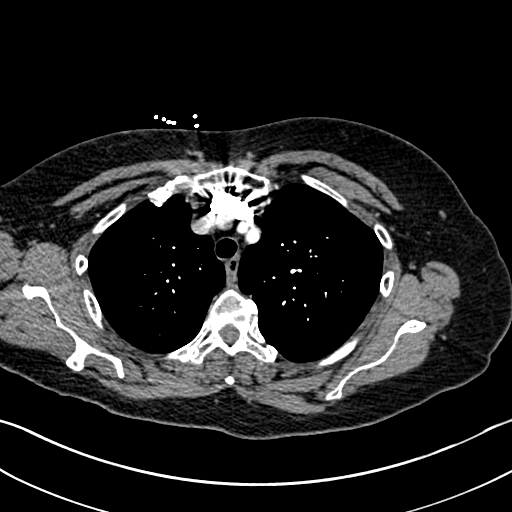
[im 257/303  lung]
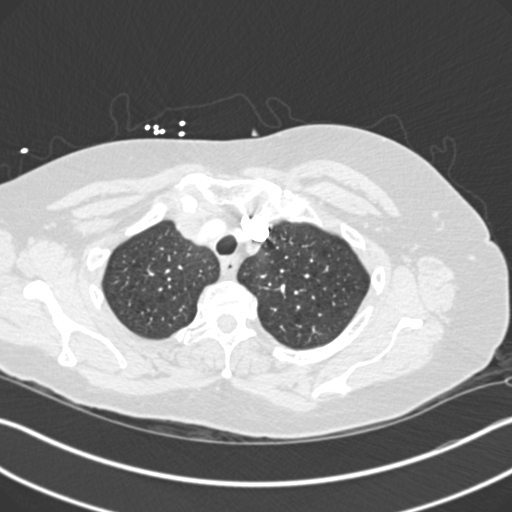
[im 272/303  mediastinal]
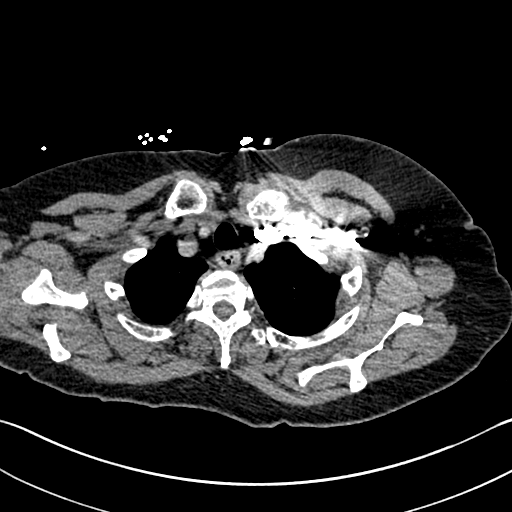
[im 287/303  lung]
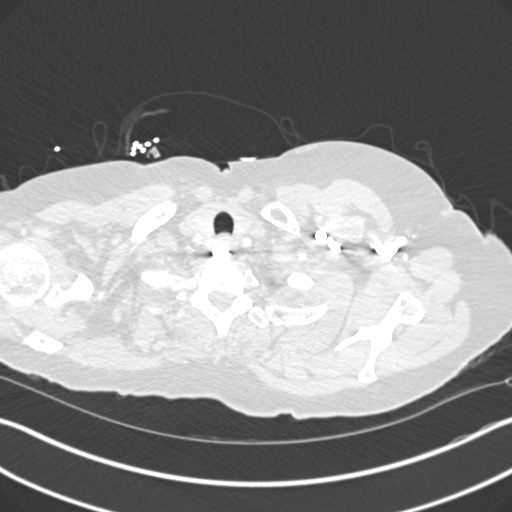

[19 of 36 positions shown; findings below may reference images not displayed]

FINDINGS: Cardiovascular: Negative for pulmonary embolism. Pulmonary arteries
normal in caliber. Slight atherosclerotic calcification aortic arch
without aneurysm or dissection. Heart size normal. Slight
calcification coronary arteries.

Mediastinum/Nodes: Negative for mass or adenopathy

Lungs/Pleura: Mild lingular atelectasis. No infiltrate effusion or
mass.

Upper Abdomen: Negative

Musculoskeletal: Multilevel degenerative change throughout the
thoracic spine. No acute skeletal abnormality.

Review of the MIP images confirms the above findings.
IMPRESSION: Negative for pulmonary embolism.  No acute abnormality in the chest.

Aortic Atherosclerosis ([2L]-[2L]).

## 2018-04-15 MED ORDER — IOHEXOL 350 MG/ML SOLN
75.0000 mL | Freq: Once | INTRAVENOUS | Status: AC | PRN
  Administered 2018-04-15: 75 mL via INTRAVENOUS

## 2018-04-15 NOTE — ED Provider Notes (Addendum)
[facility] Emergency Department Provider Note  ____________________________________________   I have reviewed the triage vital signs and the nursing notes. Where available I have reviewed prior notes and, if possible and indicated, outside hospital notes.    HISTORY  Chief Complaint Chest Pain and Shortness of Breath    HPI Teresa Lang is a 62 y.o. 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  She had a panic attack this morning but she wanted to be "sure" and it was only a panic attack.  As usual, her panic attacks are always associate with chest pain and a feeling of dyspnea.  Also great anxiety.  As the patient was able to calm herself down after visiting her doctor, her symptoms have gone and she feels fine at this time.  She denies ongoing symptoms.  Aside from coming down nothing makes it better, it was nonexertional nothing made it worse, except for thinking about it.  Patient has no leg swelling no personal family history of PE or DVT, and denies recent travel, she is not on exogenous estrogens she does have a history of fibromyalgia, she states she is been worked up for her recurrent chronic frequent chest pains as an outpatient with negative cardiac work-up every time aside from the above-mentioned cardia myopathy which she states is better now.  Family are with her now in the room and they agree that patient symptoms are very similar to multiple prior panic attacks, they feel that is what this.  Symptoms began earlier this morning she was getting into the shower and became anxious.  The patient denies any radiation of pain no tearing pain no pain to the back, no pleuritic pain, lasted for a few hours and now is gone  Past Medical History:  Diagnosis Date  . Allergy    seasonal  . Anxiety   . Arthritis   . Depression   . Hyperlipidemia    . Hypertension     Patient Active Problem List   Diagnosis Date Noted  . Chronic upper back pain (Primary Area of Pain) (right) 01/02/2018  . Occipital neuralgia of right side (Secondary Area of Pain) 01/02/2018  . Chronic neck pain (Tertiary Area of Pain)(R) 01/02/2018  . Chronic bilateral low back pain without sciatica (Fourth Area of Pain) 01/02/2018  . Chronic generalized pain 01/02/2018  . Chronic pain syndrome 01/02/2018  . Long term current use of opiate analgesic 01/02/2018  . Pharmacologic therapy 01/02/2018  . Disorder of skeletal system 01/02/2018  . Problems influencing health status 01/02/2018  . PTSD (post-traumatic stress disorder) 12/14/2017  . Fibromyalgia 12/14/2017  . Hyperlipemia, mixed 12/14/2017  . Hypertension, essential 12/14/2017  . Moderate episode of recurrent major depressive disorder (HCC) 12/14/2017  . Pain of cervical spine 12/14/2017  . Rheumatoid arthritis (HCC) 12/14/2017    Past Surgical History:  Procedure Laterality Date  . ABDOMINAL HYSTERECTOMY  1983  . CHOLECYSTECTOMY  1983  . SPINE SURGERY  beginning 1994   laminectomies, fusions    Prior to Admission medications   Medication Sig Start Date End Date Taking? Authorizing Provider  acetaminophen (TYLENOL) 500 MG tablet Take 500 mg by mouth every 6 (six) hours as needed.    [provider]  albuterol (PROVENTIL HFA;VENTOLIN HFA) 108 (90 Base) MCG/ACT inhaler Inhale into the lungs every 6 (six) hours as needed for wheezing or shortness of breath.  [provider]  aspirin EC 81 MG tablet Take 81 mg by mouth daily.    [provider]  atorvastatin (LIPITOR) 40 MG tablet Take 40 mg by mouth daily.    [provider]  busPIRone (BUSPAR) 10 MG tablet Take 10 mg by mouth 2 (two) times daily.    [provider]  FLUoxetine (PROZAC) 40 MG capsule Take 40 mg by mouth daily.    [provider]  Fluticasone-Umeclidin-Vilant (TRELEGY ELLIPTA)  100-62.5-25 MCG/INH AEPB Inhale into the lungs every morning.    [provider]  gabapentin (NEURONTIN) 300 MG capsule Take 600 mg by mouth 3 (three) times daily.    [provider]  ibuprofen (ADVIL,MOTRIN) 200 MG tablet Take 200 mg by mouth every 6 (six) hours as needed.    [provider]  isosorbide mononitrate (IMDUR) 30 MG 24 hr tablet Take 30 mg by mouth 2 (two) times daily.    [provider]  lisinopril-hydrochlorothiazide (PRINZIDE,ZESTORETIC) 20-12.5 MG tablet Take 1 tablet by mouth daily.    [provider]  metoprolol tartrate (LOPRESSOR) 25 MG tablet Take 25 mg by mouth 2 (two) times daily.    [provider]  risperiDONE (RISPERDAL) 0.5 MG tablet Take 0.5 mg by mouth at bedtime.    [provider]  tiZANidine (ZANAFLEX) 4 MG capsule Take 4 mg by mouth 3 (three) times daily.    [provider]    Allergies Prednisone  Family History  Problem Relation Age of Onset  . Celiac disease Sister   . Breast cancer Neg Hx     Social History Social History   Tobacco Use  . Smoking status: Current Every Day Smoker    Packs/day: 0.25    Years: 57.00    Pack years: 14.25    Types: Cigarettes    Start date: 37  . Smokeless tobacco: Never Used  . Tobacco comment: Also VAPES on a daily basis  Substance Use Topics  . Alcohol use: Never    Frequency: Never  . Drug use: Yes    Frequency: 7.0 times per week    Types: Marijuana    Comment: "maybe once in the evening"; also uses "KRATUM, the herb, to helps with pain and anxiety"    Review of Systems Constitutional: No fever/chills Eyes: No visual changes. ENT: No sore throat. No stiff neck no neck pain Cardiovascular:  chest pain. Respiratory:  shortness of breath. Gastrointestinal:   no vomiting.  No diarrhea.  No constipation. Genitourinary: Negative for dysuria. Musculoskeletal: Negative lower extremity swelling Skin: Negative for  rash. Neurological: Negative for severe headaches, focal weakness or numbness.   ____________________________________________   PHYSICAL EXAM:  VITAL SIGNS: ED Triage Vitals  Enc Vitals Group     BP 04/15/18 1121 (!) 127/56     Pulse Rate 04/15/18 1121 67     Resp 04/15/18 1121 16     Temp 04/15/18 1121 98.6 F (37 C)     Temp Source 04/15/18 1121 Oral     SpO2 04/15/18 1121 96 %     Weight 04/15/18 1123 167 lb (75.8 kg)     Height 04/15/18 1123 5\' 5"  (1.651 m)     Head Circumference --      Peak Flow --      Pain Score 04/15/18 1122 5     Pain Loc --      Pain Edu? --      Excl. in GC? --  Constitutional: Alert and oriented. Well appearing and in no acute distress. Eyes: Conjunctivae are normal Head: Atraumatic HEENT: No congestion/rhinnorhea. Mucous membranes are moist.  Oropharynx non-erythematous Neck:   Nontender with no meningismus, no masses, no stridor Cardiovascular: Normal rate, regular rhythm. Grossly normal heart sounds.  Good peripheral circulation. Respiratory: Normal respiratory effort.  No retractions. Lungs CTAB. Abdominal: Soft and nontender. No distention. No guarding no rebound Back:  There is no focal tenderness or step off.  there is no midline tenderness there are no lesions noted. there is no CVA tenderness Musculoskeletal: No lower extremity tenderness, no upper extremity tenderness. No joint effusions, no DVT signs strong distal pulses no edema Neurologic:  Normal speech and language. No gross focal neurologic deficits are appreciated.  Skin:  Skin is warm, dry and intact. No rash noted. Psychiatric: Mood and affect are somewhat anxious. Speech and behavior are normal.  ____________________________________________   LABS (all labs ordered are listed, but only abnormal results are displayed)  Labs Reviewed  BASIC METABOLIC PANEL - Abnormal; Notable for the following components:      Result Value   Glucose, Bld 127 (*)    Creatinine, Ser  1.05 (*)    GFR calc non Af Amer 55 (*)    All other components within normal limits  CBC  TROPONIN I    Pertinent labs  results that were available during my care of the patient were reviewed by me and considered in my medical decision making (see chart for details). ____________________________________________  EKG  I personally interpreted any EKGs ordered by me or triage Normal sinus rhythm, rate 67 bpm no acute ST elevation or depression normal axis ____________________________________________  RADIOLOGY  Pertinent labs & imaging results that were available during my care of the patient were reviewed by me and considered in my medical decision making (see chart for details). If possible, patient and/or family made aware of any abnormal findings.  Dg Chest 2 View  Result Date: 04/15/2018 CLINICAL DATA:  Shortness of breath and chest pain EXAM: CHEST - 2 VIEW COMPARISON:  None. FINDINGS: Lungs are clear. The heart size and pulmonary vascularity are normal. No adenopathy. There is postoperative change in the cervical spine. No pneumothorax. IMPRESSION: No edema or consolidation. Electronically Signed   By: Bretta Bang III M.D.   On: 04/15/2018 12:58   ____________________________________________    PROCEDURES  Procedure(s) performed: None  Procedures  Critical Care performed: None  ____________________________________________   INITIAL IMPRESSION / ASSESSMENT AND PLAN / ED COURSE  Pertinent labs & imaging results that were available during my care of the patient were reviewed by me and considered in my medical decision making (see chart for details).  Patient is in no acute distress at this time, states she had a panic attack this morning and was patient was just a panic attack.  Differential includes multiple different etiologies including PE ACS dissection back reassuringly however, her cardiac enzymes or EKG and her blood work are all reassuring, chest x-ray does  not show any acute event, I do not think a CT scan is in the patient's best interest versus panic attack and I do not think a d-dimer is either, she has no signs or symptoms at this time and all of her symptoms went away as soon as she relaxed.  Course anytime this happens to her could be a PE, but a CT scan I think is not warranted in this case.  We will send a second set of cardiac  enzymes, patient remains asymptomatic at this time  ----------------------------------------- 3:49 PM on 04/15/2018 -----------------------------------------  Patient's d-dimer was positive, given that we do not have much history in her here we did a CT scan which is negative, patient is in no acute distress second troponin is negative, she states that she feels this was a panic attack and it seems that there is no evidence to contradict her at this time.  All the findings were discussed with the patient.  She is eager to go home.  I did talk to her about admission but she very much would prefer discharge and declines admission.  I discussed with Dr. Christena Flake of cardiology, who agrees with management discharge and will see the patient tomorrow morning at 9 AM.  Patient very comfortable with this plan states she will go.  She is in no acute distress at this time we will discharge.  Return precautions follow-up given and understood.    ____________________________________________   FINAL CLINICAL IMPRESSION(S) / ED DIAGNOSES  Final diagnoses:  None      This chart was dictated using voice recognition software.  Despite best efforts to proofread,  errors can occur which can change meaning.      Jeanmarie Plant, MD 04/15/18 1407    Jeanmarie Plant, MD 04/15/18 [phone]

## 2018-04-15 NOTE — Discharge Instructions (Signed)
Follow-up with cardiology tomorrow morning at 9 AM, return to the emergency room for any new or worrisome symptoms including chest pain, shortness of breath or if you feel worse in any way.

## 2018-04-15 NOTE — ED Triage Notes (Addendum)
Pt to ED reporting chest pain and SOB that started this morning around 5am. 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 PT denies other symptoms. No radiation reported. No medications taken. No changes pain with movement or rest.   Pt had an episode of dizziness a couple days ago and reports having a syncopal episode.

## 2018-10-16 DIAGNOSIS — Z720 Tobacco use: Secondary | ICD-10-CM | POA: Insufficient documentation

## 2018-10-16 DIAGNOSIS — F17200 Nicotine dependence, unspecified, uncomplicated: Secondary | ICD-10-CM | POA: Diagnosis present

## 2018-11-14 ENCOUNTER — Encounter: Payer: Self-pay | Admitting: Emergency Medicine

## 2018-11-14 ENCOUNTER — Emergency Department
Admission: EM | Admit: 2018-11-14 | Discharge: 2018-11-14 | Disposition: A | Discharge status: Other Acute Inpatient Hospital | Payer: Medicare HMO | Attending: Emergency Medicine | Admitting: Emergency Medicine

## 2018-11-14 ENCOUNTER — Inpatient Hospital Stay
Admission: AD | Admit: 2018-11-14 | Discharge: 2018-11-20 | DRG: 885 | Disposition: A | Discharge status: Home / Self Care | Payer: Medicare HMO | Source: Intra-hospital | Attending: Psychiatry | Admitting: Psychiatry

## 2018-11-14 DIAGNOSIS — H1010 Acute atopic conjunctivitis, unspecified eye: Secondary | ICD-10-CM | POA: Diagnosis present

## 2018-11-14 DIAGNOSIS — Z79899 Other long term (current) drug therapy: Secondary | ICD-10-CM | POA: Insufficient documentation

## 2018-11-14 DIAGNOSIS — F17200 Nicotine dependence, unspecified, uncomplicated: Secondary | ICD-10-CM

## 2018-11-14 DIAGNOSIS — M069 Rheumatoid arthritis, unspecified: Secondary | ICD-10-CM | POA: Diagnosis present

## 2018-11-14 DIAGNOSIS — Z90710 Acquired absence of both cervix and uterus: Secondary | ICD-10-CM | POA: Diagnosis not present

## 2018-11-14 DIAGNOSIS — Z6830 Body mass index (BMI) 30.0-30.9, adult: Secondary | ICD-10-CM | POA: Diagnosis not present

## 2018-11-14 DIAGNOSIS — F332 Major depressive disorder, recurrent severe without psychotic features: Secondary | ICD-10-CM | POA: Diagnosis present

## 2018-11-14 DIAGNOSIS — Z79891 Long term (current) use of opiate analgesic: Secondary | ICD-10-CM

## 2018-11-14 DIAGNOSIS — Z7982 Long term (current) use of aspirin: Secondary | ICD-10-CM

## 2018-11-14 DIAGNOSIS — F329 Major depressive disorder, single episode, unspecified: Secondary | ICD-10-CM

## 2018-11-14 DIAGNOSIS — F17210 Nicotine dependence, cigarettes, uncomplicated: Secondary | ICD-10-CM | POA: Diagnosis not present

## 2018-11-14 DIAGNOSIS — Z818 Family history of other mental and behavioral disorders: Secondary | ICD-10-CM | POA: Diagnosis not present

## 2018-11-14 DIAGNOSIS — Z9049 Acquired absence of other specified parts of digestive tract: Secondary | ICD-10-CM

## 2018-11-14 DIAGNOSIS — Z7951 Long term (current) use of inhaled steroids: Secondary | ICD-10-CM

## 2018-11-14 DIAGNOSIS — Z915 Personal history of self-harm: Secondary | ICD-10-CM

## 2018-11-14 DIAGNOSIS — F410 Panic disorder [episodic paroxysmal anxiety] without agoraphobia: Secondary | ICD-10-CM | POA: Diagnosis present

## 2018-11-14 DIAGNOSIS — E669 Obesity, unspecified: Secondary | ICD-10-CM | POA: Diagnosis present

## 2018-11-14 DIAGNOSIS — F1290 Cannabis use, unspecified, uncomplicated: Secondary | ICD-10-CM | POA: Diagnosis present

## 2018-11-14 DIAGNOSIS — M797 Fibromyalgia: Secondary | ICD-10-CM | POA: Diagnosis present

## 2018-11-14 DIAGNOSIS — R200 Anesthesia of skin: Secondary | ICD-10-CM

## 2018-11-14 DIAGNOSIS — M961 Postlaminectomy syndrome, not elsewhere classified: Secondary | ICD-10-CM | POA: Diagnosis present

## 2018-11-14 DIAGNOSIS — F4310 Post-traumatic stress disorder, unspecified: Secondary | ICD-10-CM | POA: Diagnosis present

## 2018-11-14 DIAGNOSIS — J449 Chronic obstructive pulmonary disease, unspecified: Secondary | ICD-10-CM | POA: Diagnosis present

## 2018-11-14 DIAGNOSIS — I10 Essential (primary) hypertension: Secondary | ICD-10-CM | POA: Diagnosis present

## 2018-11-14 DIAGNOSIS — Z888 Allergy status to other drugs, medicaments and biological substances status: Secondary | ICD-10-CM

## 2018-11-14 DIAGNOSIS — F429 Obsessive-compulsive disorder, unspecified: Secondary | ICD-10-CM | POA: Diagnosis present

## 2018-11-14 DIAGNOSIS — G894 Chronic pain syndrome: Secondary | ICD-10-CM | POA: Diagnosis not present

## 2018-11-14 DIAGNOSIS — G4700 Insomnia, unspecified: Secondary | ICD-10-CM | POA: Diagnosis present

## 2018-11-14 DIAGNOSIS — K219 Gastro-esophageal reflux disease without esophagitis: Secondary | ICD-10-CM | POA: Diagnosis present

## 2018-11-14 DIAGNOSIS — R45851 Suicidal ideations: Secondary | ICD-10-CM | POA: Diagnosis present

## 2018-11-14 DIAGNOSIS — F32A Depression, unspecified: Secondary | ICD-10-CM

## 2018-11-14 DIAGNOSIS — G8929 Other chronic pain: Secondary | ICD-10-CM | POA: Diagnosis present

## 2018-11-14 DIAGNOSIS — F419 Anxiety disorder, unspecified: Secondary | ICD-10-CM

## 2018-11-14 DIAGNOSIS — F4010 Social phobia, unspecified: Secondary | ICD-10-CM | POA: Diagnosis present

## 2018-11-14 DIAGNOSIS — I25118 Atherosclerotic heart disease of native coronary artery with other forms of angina pectoris: Secondary | ICD-10-CM | POA: Diagnosis present

## 2018-11-14 LAB — COMPREHENSIVE METABOLIC PANEL
ALK PHOS: 59 U/L (ref 38–126)
ALT: 24 U/L (ref 0–44)
AST: 28 U/L (ref 15–41)
Albumin: 3.9 g/dL (ref 3.5–5.0)
Anion gap: 6 (ref 5–15)
BILIRUBIN TOTAL: 1.2 mg/dL (ref 0.3–1.2)
BUN: 19 mg/dL (ref 8–23)
CALCIUM: 9.7 mg/dL (ref 8.9–10.3)
CO2: 32 mmol/L (ref 22–32)
CREATININE: 0.93 mg/dL (ref 0.44–1.00)
Chloride: 101 mmol/L (ref 98–111)
GFR calc Af Amer: >60 mL/min (ref >60)
GLUCOSE: 138 mg/dL — AB (ref 70–99)
POTASSIUM: 3.3 mmol/L — AB (ref 3.5–5.1)
Sodium: 139 mmol/L (ref 135–145)
TOTAL PROTEIN: 7.2 g/dL (ref 6.5–8.1)

## 2018-11-14 LAB — CBC
HEMATOCRIT: 42.2 % (ref 36.0–46.0)
HEMOGLOBIN: 14.1 g/dL (ref 12.0–15.0)
MCH: 31.4 pg (ref 26.0–34.0)
MCHC: 33.4 g/dL (ref 30.0–36.0)
MCV: 94 fL (ref 80.0–100.0)
Platelets: 281 10*3/uL (ref 150–400)
RBC: 4.49 MIL/uL (ref 3.87–5.11)
RDW: 13.3 % (ref 11.5–15.5)
WBC: 7.9 10*3/uL (ref 4.0–10.5)
nRBC: 0 % (ref 0.0–0.2)

## 2018-11-14 LAB — ACETAMINOPHEN LEVEL: Acetaminophen (Tylenol), Serum: <10 ug/mL — ABNORMAL LOW (ref 10–30)

## 2018-11-14 LAB — ETHANOL

## 2018-11-14 LAB — SALICYLATE LEVEL: Salicylate Lvl: <7 mg/dL (ref 2.8–30.0)

## 2018-11-14 MED ORDER — METHOTREXATE 2.5 MG PO TABS
15.0000 mg | ORAL_TABLET | ORAL | Status: DC
  Administered 2018-11-17: 15 mg via ORAL
  Filled 2018-11-14: qty 6

## 2018-11-14 MED ORDER — ALBUTEROL SULFATE (2.5 MG/3ML) 0.083% IN NEBU: 3.0000 mL | INHALATION_SOLUTION | Freq: Four times a day (QID) | RESPIRATORY_TRACT | Status: DC | PRN

## 2018-11-14 MED ORDER — ALUM & MAG HYDROXIDE-SIMETH 200-200-20 MG/5ML PO SUSP: 30.0000 mL | ORAL | Status: DC | PRN

## 2018-11-14 MED ORDER — MIRTAZAPINE 15 MG PO TABS: 30.0000 mg | ORAL_TABLET | Freq: Every day | ORAL | Status: DC

## 2018-11-14 MED ORDER — ASPIRIN EC 81 MG PO TBEC
81.0000 mg | DELAYED_RELEASE_TABLET | Freq: Every day | ORAL | Status: DC
  Administered 2018-11-15 – 2018-11-20 (×6): 81 mg via ORAL
  Filled 2018-11-14 (×6): qty 1

## 2018-11-14 MED ORDER — OLOPATADINE HCL 0.1 % OP SOLN
1.0000 [drp] | Freq: Two times a day (BID) | OPHTHALMIC | Status: DC
  Administered 2018-11-14 – 2018-11-18 (×2): 1 [drp] via OPHTHALMIC
  Filled 2018-11-14: qty 5

## 2018-11-14 MED ORDER — ASPIRIN EC 81 MG PO TBEC
81.0000 mg | DELAYED_RELEASE_TABLET | Freq: Every day | ORAL | Status: DC
  Administered 2018-11-14: 81 mg via ORAL
  Filled 2018-11-14: qty 1

## 2018-11-14 MED ORDER — FLUTICASONE-UMECLIDIN-VILANT 100-62.5-25 MCG/INH IN AEPB: 1.0000 | INHALATION_SPRAY | Freq: Every day | RESPIRATORY_TRACT | Status: DC

## 2018-11-14 MED ORDER — OLOPATADINE HCL 0.1 % OP SOLN: 1.0000 [drp] | Freq: Two times a day (BID) | OPHTHALMIC | Status: DC

## 2018-11-14 MED ORDER — ISOSORBIDE MONONITRATE ER 30 MG PO TB24
30.0000 mg | ORAL_TABLET | Freq: Two times a day (BID) | ORAL | Status: DC
  Administered 2018-11-15 – 2018-11-20 (×10): 30 mg via ORAL
  Filled 2018-11-14 (×12): qty 1

## 2018-11-14 MED ORDER — ATORVASTATIN CALCIUM 20 MG PO TABS
40.0000 mg | ORAL_TABLET | Freq: Every evening | ORAL | Status: DC
  Administered 2018-11-14: 40 mg via ORAL
  Filled 2018-11-14: qty 2

## 2018-11-14 MED ORDER — TIZANIDINE HCL 4 MG PO TABS
4.0000 mg | ORAL_TABLET | Freq: Three times a day (TID) | ORAL | Status: DC
  Administered 2018-11-14: 4 mg via ORAL
  Filled 2018-11-14 (×2): qty 1

## 2018-11-14 MED ORDER — FLUOXETINE HCL 20 MG PO CAPS
60.0000 mg | ORAL_CAPSULE | Freq: Every day | ORAL | Status: DC
  Administered 2018-11-15 – 2018-11-20 (×6): 60 mg via ORAL
  Filled 2018-11-14 (×6): qty 3

## 2018-11-14 MED ORDER — METHOTREXATE 2.5 MG PO TABS: 15.0000 mg | ORAL_TABLET | ORAL | Status: DC

## 2018-11-14 MED ORDER — UMECLIDINIUM BROMIDE 62.5 MCG/INH IN AEPB
1.0000 | INHALATION_SPRAY | Freq: Every day | RESPIRATORY_TRACT | Status: DC
  Administered 2018-11-15 – 2018-11-20 (×6): 1 via RESPIRATORY_TRACT
  Filled 2018-11-14: qty 7

## 2018-11-14 MED ORDER — METOPROLOL TARTRATE 25 MG PO TABS
25.0000 mg | ORAL_TABLET | Freq: Two times a day (BID) | ORAL | Status: DC
  Administered 2018-11-14 – 2018-11-20 (×12): 25 mg via ORAL
  Filled 2018-11-14 (×11): qty 1

## 2018-11-14 MED ORDER — BREXPIPRAZOLE 3 MG PO TABS: 3.0000 mg | ORAL_TABLET | Freq: Every day | ORAL | Status: DC

## 2018-11-14 MED ORDER — OXYCODONE HCL 5 MG PO TABS
5.0000 mg | ORAL_TABLET | Freq: Three times a day (TID) | ORAL | Status: DC
  Administered 2018-11-14: 5 mg via ORAL
  Filled 2018-11-14: qty 1

## 2018-11-14 MED ORDER — FOLIC ACID 1 MG PO TABS: 1.0000 mg | ORAL_TABLET | Freq: Every day | ORAL | Status: DC

## 2018-11-14 MED ORDER — BUSPIRONE HCL 5 MG PO TABS
15.0000 mg | ORAL_TABLET | Freq: Three times a day (TID) | ORAL | Status: DC
  Administered 2018-11-14: 15 mg via ORAL
  Filled 2018-11-14: qty 3

## 2018-11-14 MED ORDER — PANTOPRAZOLE SODIUM 40 MG PO TBEC: 40.0000 mg | DELAYED_RELEASE_TABLET | Freq: Every day | ORAL | Status: DC

## 2018-11-14 MED ORDER — ATORVASTATIN CALCIUM 20 MG PO TABS
40.0000 mg | ORAL_TABLET | Freq: Every evening | ORAL | Status: DC
  Administered 2018-11-15 – 2018-11-19 (×5): 40 mg via ORAL
  Filled 2018-11-14 (×5): qty 2

## 2018-11-14 MED ORDER — ISOSORBIDE MONONITRATE ER 60 MG PO TB24: 30.0000 mg | ORAL_TABLET | Freq: Two times a day (BID) | ORAL | Status: DC

## 2018-11-14 MED ORDER — DICLOFENAC SODIUM 1 % TD GEL: 2.0000 g | Freq: Four times a day (QID) | TRANSDERMAL | Status: DC | PRN

## 2018-11-14 MED ORDER — BUSPIRONE HCL 15 MG PO TABS
15.0000 mg | ORAL_TABLET | Freq: Three times a day (TID) | ORAL | Status: DC
  Administered 2018-11-15 – 2018-11-20 (×16): 15 mg via ORAL
  Filled 2018-11-14 (×2): qty 3
  Filled 2018-11-14: qty 1
  Filled 2018-11-14: qty 3
  Filled 2018-11-14 (×3): qty 1
  Filled 2018-11-14: qty 3
  Filled 2018-11-14: qty 1
  Filled 2018-11-14: qty 3
  Filled 2018-11-14: qty 1
  Filled 2018-11-14 (×2): qty 3
  Filled 2018-11-14: qty 1
  Filled 2018-11-14 (×2): qty 3
  Filled 2018-11-14 (×2): qty 1
  Filled 2018-11-14 (×2): qty 3
  Filled 2018-11-14 (×3): qty 1
  Filled 2018-11-14: qty 3
  Filled 2018-11-14: qty 1
  Filled 2018-11-14: qty 3
  Filled 2018-11-14 (×2): qty 1
  Filled 2018-11-14: qty 3
  Filled 2018-11-14: qty 1

## 2018-11-14 MED ORDER — PANTOPRAZOLE SODIUM 40 MG PO TBEC
40.0000 mg | DELAYED_RELEASE_TABLET | Freq: Every day | ORAL | Status: DC
  Administered 2018-11-15 – 2018-11-20 (×6): 40 mg via ORAL
  Filled 2018-11-14 (×6): qty 1

## 2018-11-14 MED ORDER — IBUPROFEN 400 MG PO TABS: 200.0000 mg | ORAL_TABLET | Freq: Four times a day (QID) | ORAL | Status: DC | PRN

## 2018-11-14 MED ORDER — OXYCODONE HCL 5 MG PO TABS
5.0000 mg | ORAL_TABLET | Freq: Three times a day (TID) | ORAL | Status: DC
  Administered 2018-11-15 – 2018-11-20 (×16): 5 mg via ORAL
  Filled 2018-11-14 (×16): qty 1

## 2018-11-14 MED ORDER — LORAZEPAM 1 MG PO TABS
1.0000 mg | ORAL_TABLET | Freq: Four times a day (QID) | ORAL | Status: DC | PRN
  Administered 2018-11-15 – 2018-11-16 (×2): 1 mg via ORAL
  Filled 2018-11-14 (×2): qty 1

## 2018-11-14 MED ORDER — FLUOXETINE HCL 20 MG PO CAPS: 60.0000 mg | ORAL_CAPSULE | Freq: Every day | ORAL | Status: DC

## 2018-11-14 MED ORDER — FLUTICASONE FUROATE-VILANTEROL 100-25 MCG/INH IN AEPB
1.0000 | INHALATION_SPRAY | Freq: Every day | RESPIRATORY_TRACT | Status: DC
  Administered 2018-11-15 – 2018-11-20 (×6): 1 via RESPIRATORY_TRACT
  Filled 2018-11-14: qty 28

## 2018-11-14 MED ORDER — MAGNESIUM HYDROXIDE 400 MG/5ML PO SUSP: 30.0000 mL | Freq: Every day | ORAL | Status: DC | PRN

## 2018-11-14 MED ORDER — LISINOPRIL-HYDROCHLOROTHIAZIDE 20-12.5 MG PO TABS: 1.0000 | ORAL_TABLET | Freq: Two times a day (BID) | ORAL | Status: DC

## 2018-11-14 MED ORDER — LORAZEPAM 1 MG PO TABS
1.0000 mg | ORAL_TABLET | Freq: Four times a day (QID) | ORAL | Status: DC | PRN
  Filled 2018-11-14 (×2): qty 1

## 2018-11-14 MED ORDER — BREXPIPRAZOLE 1 MG PO TABS
3.0000 mg | ORAL_TABLET | Freq: Every day | ORAL | Status: DC
  Administered 2018-11-14 – 2018-11-19 (×6): 3 mg via ORAL
  Filled 2018-11-14 (×9): qty 3

## 2018-11-14 MED ORDER — LORAZEPAM 1 MG PO TABS
1.0000 mg | ORAL_TABLET | Freq: Once | ORAL | Status: AC
  Administered 2018-11-14: 1 mg via ORAL

## 2018-11-14 MED ORDER — ACETAMINOPHEN 325 MG PO TABS
650.0000 mg | ORAL_TABLET | Freq: Four times a day (QID) | ORAL | Status: DC | PRN
  Administered 2018-11-19: 650 mg via ORAL
  Filled 2018-11-14: qty 2

## 2018-11-14 MED ORDER — MIRTAZAPINE 15 MG PO TABS
30.0000 mg | ORAL_TABLET | Freq: Every day | ORAL | Status: DC
  Administered 2018-11-14 – 2018-11-19 (×6): 30 mg via ORAL
  Filled 2018-11-14 (×6): qty 2

## 2018-11-14 MED ORDER — METOPROLOL TARTRATE 25 MG PO TABS: 25.0000 mg | ORAL_TABLET | Freq: Two times a day (BID) | ORAL | Status: DC

## 2018-11-14 MED ORDER — DICLOFENAC SODIUM 1 % TD GEL
2.0000 g | Freq: Four times a day (QID) | TRANSDERMAL | Status: DC | PRN
  Filled 2018-11-14: qty 100

## 2018-11-14 MED ORDER — FOLIC ACID 1 MG PO TABS
1.0000 mg | ORAL_TABLET | Freq: Every day | ORAL | Status: DC
  Administered 2018-11-15 – 2018-11-20 (×6): 1 mg via ORAL
  Filled 2018-11-14 (×6): qty 1

## 2018-11-14 NOTE — ED Notes (Signed)
pts sister has come for a visit  - observed by [employer]
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
## 2018-11-14 NOTE — ED Notes (Signed)
Sent red,green,purple tubes to lab.
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
## 2018-11-14 NOTE — ED Notes (Addendum)
Patient transferred to [facility] via wheelchair with NT and officer. Patient I stable in NAD. Patient belongings was taken by sister. Report given to RN [facility]. No issues.
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
## 2018-11-14 NOTE — ED Notes (Signed)
VOL  PENDING  PLACEMENT 

## 2018-11-14 NOTE — ED Notes (Signed)
BEHAVIORAL HEALTH ROUNDING Patient sleeping: No. Patient alert and oriented: yes Behavior appropriate: Yes.  ; If no, describe:  Nutrition and fluids offered: yes Toileting and hygiene offered: Yes  Sitter present: q15 minute observations and security  monitoring Law enforcement present: Yes  ODS  

## 2018-11-14 NOTE — ED Notes (Signed)
Sent urine to lab

## 2018-11-14 NOTE — ED Triage Notes (Signed)
Pt reports is depressed and having anxiety attacks.

## 2018-11-14 NOTE — ED Notes (Signed)
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
## 2018-11-14 NOTE — ED Notes (Signed)
Hourly rounding reveals patient in room. No complaints, stable, in no acute distress. Q15 minute rounds and monitoring via Rover and Officer to continue.   
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
## 2018-11-14 NOTE — ED Provider Notes (Signed)
[facility] Emergency Department Provider Note  ____________________________________________   First MD Initiated Contact with Patient 11/14/18 1206     (approximate)  I have reviewed the triage vital signs and the nursing notes.   HISTORY  Chief Complaint Depression   HPI Teresa Lang is a 63 y.o. female with a history of anxiety and depression was presented emergency department from [facility] for worsening anxiety and depression.  The patient says that she is crying spontaneously recently.  Denies any suicidal or homicidal ideation.  She says that her psychiatrist at [facility] has been working with her to try and do her therapy as an outpatient but is now recommending inpatient treatment.   Past Medical History:  Diagnosis Date  . Allergy    seasonal  . Anxiety   . Arthritis   . Depression   . Hyperlipidemia   . Hypertension     Patient Active Problem List   Diagnosis Date Noted  . Chronic upper back pain (Primary Area of Pain) (right) 01/02/2018  . Occipital neuralgia of right side (Secondary Area of Pain) 01/02/2018  . Chronic neck pain (Tertiary Area of Pain)(R) 01/02/2018  . Chronic bilateral low back pain without sciatica (Fourth Area of Pain) 01/02/2018  . Chronic generalized pain 01/02/2018  . Chronic pain syndrome 01/02/2018  . Long term current use of opiate analgesic 01/02/2018  . Pharmacologic therapy 01/02/2018  . Disorder of skeletal system 01/02/2018  . Problems influencing health status 01/02/2018  . PTSD (post-traumatic stress disorder) 12/14/2017  . Fibromyalgia 12/14/2017  . Hyperlipemia, mixed 12/14/2017  . Hypertension, essential 12/14/2017  . Moderate episode of recurrent major depressive disorder (HCC) 12/14/2017  . Pain of cervical spine 12/14/2017  . Rheumatoid arthritis (HCC) 12/14/2017    Past Surgical History:  Procedure Laterality Date  . ABDOMINAL HYSTERECTOMY  1983  . CHOLECYSTECTOMY  1983  . SPINE SURGERY   beginning 1994   laminectomies, fusions    Prior to Admission medications   Medication Sig Start Date End Date Taking? Authorizing Provider  aspirin EC 81 MG tablet Take 81 mg by mouth daily.   Yes [provider]  atorvastatin (LIPITOR) 40 MG tablet Take 40 mg by mouth daily.   Yes [provider]  Brexpiprazole 3 MG TABS Take 3 mg by mouth at bedtime.   Yes [provider]  busPIRone (BUSPAR) 15 MG tablet Take 15 mg by mouth 3 (three) times daily.    Yes [provider]  diclofenac sodium (VOLTAREN) 1 % GEL Apply 2 g topically 4 (four) times daily as needed (pain).   Yes [provider]  FLUoxetine (PROZAC) 20 MG capsule Take 60 mg by mouth daily.    Yes [provider]  folic acid (FOLVITE) 1 MG tablet Take 1 mg by mouth daily. 02/15/18  Yes [provider]  isosorbide mononitrate (IMDUR) 30 MG 24 hr tablet Take 30 mg by mouth 2 (two) times daily.   Yes [provider]  lisinopril-hydrochlorothiazide (PRINZIDE,ZESTORETIC) 20-12.5 MG tablet Take 1 tablet by mouth 2 (two) times daily.    Yes [provider]  methotrexate (RHEUMATREX) 2.5 MG tablet Take 15 mg by mouth once a week.  10/16/18  Yes [provider]  metoprolol tartrate (LOPRESSOR) 25 MG tablet Take 25 mg by mouth 2 (two) times daily.   Yes [provider]  mirtazapine (REMERON) 30 MG tablet Take 30 mg by mouth at bedtime. 10/31/18  Yes [provider]  omeprazole (PRILOSEC) 40  MG capsule Take 40 mg by mouth daily. 06/04/18 12/01/18 Yes [provider]  oxyCODONE (OXY IR/ROXICODONE) 5 MG immediate release tablet Take 5 mg by mouth 3 (three) times daily. 11/02/18  Yes [provider]  tiZANidine (ZANAFLEX) 4 MG tablet Take 4 mg by mouth 3 (three) times daily.    Yes [provider]  TRELEGY ELLIPTA 100-62.5-25 MCG/INH AEPB Inhale 1 puff into the lungs daily. 10/23/18  Yes [provider]  albuterol  (PROVENTIL HFA;VENTOLIN HFA) 108 (90 Base) MCG/ACT inhaler Inhale 2 puffs into the lungs every 6 (six) hours as needed for wheezing or shortness of breath.     [provider]  ibuprofen (ADVIL,MOTRIN) 200 MG tablet Take 200 mg by mouth every 6 (six) hours as needed for mild pain.     [provider]  Olopatadine HCl 0.2 % SOLN Place 1 drop into both eyes daily. 08/03/18   [provider]    Allergies Prednisone  Family History  Problem Relation Age of Onset  . Celiac disease Sister   . Breast cancer Neg Hx     Social History Social History   Tobacco Use  . Smoking status: Current Every Day Smoker    Packs/day: 0.25    Years: 57.00    Pack years: 14.25    Types: Cigarettes    Start date: 72  . Smokeless tobacco: Never Used  . Tobacco comment: Also VAPES on a daily basis  Substance Use Topics  . Alcohol use: Never    Frequency: Never  . Drug use: Yes    Frequency: 7.0 times per week    Types: Marijuana    Comment: "maybe once in the evening"; also uses "KRATUM, the herb, to helps with pain and anxiety"    Review of Systems  Constitutional: No fever/chills Eyes: No visual changes. ENT: No sore throat. Cardiovascular: Denies chest pain. Respiratory: Denies shortness of breath. Gastrointestinal: No abdominal pain.  No nausea, no vomiting.  No diarrhea.  No constipation. Genitourinary: Negative for dysuria. Musculoskeletal: Negative for back pain. Skin: Negative for rash. Neurological: Negative for headaches, focal weakness or numbness.   ____________________________________________   PHYSICAL EXAM:  VITAL SIGNS: ED Triage Vitals [11/14/18 1111]  Enc Vitals Group     BP      Pulse      Resp      Temp      Temp src      SpO2      Weight 184 lb (83.5 kg)     Height 5\' 5"  (1.651 m)     Head Circumference      Peak Flow      Pain Score 0     Pain Loc      Pain Edu?      Excl. in GC?     Constitutional: Alert and oriented.  Well appearing and in no acute distress. Eyes: Conjunctivae are normal.  Head: Atraumatic. Nose: No congestion/rhinnorhea. Mouth/Throat: Mucous membranes are moist.  Neck: No stridor.   Cardiovascular: Normal rate, regular rhythm. Grossly normal heart sounds.  Respiratory: Normal respiratory effort.  No retractions. Lungs CTAB. Gastrointestinal: Soft and nontender. No distention. Musculoskeletal: No lower extremity tenderness nor edema.  No joint effusions. Neurologic:  Normal speech and language. No gross focal neurologic deficits are appreciated. Skin:  Skin is warm, dry and intact. No rash noted. Psychiatric: Mood and affect are normal. Speech and behavior are normal.  ____________________________________________   LABS (all labs ordered are listed, but  only abnormal results are displayed)  Labs Reviewed  COMPREHENSIVE METABOLIC PANEL - Abnormal; Notable for the following components:      Result Value   Potassium 3.3 (*)    Glucose, Bld 138 (*)    All other components within normal limits  ACETAMINOPHEN LEVEL - Abnormal; Notable for the following components:   Acetaminophen (Tylenol), Serum <10 (*)    All other components within normal limits  ETHANOL  SALICYLATE LEVEL  CBC   ____________________________________________  EKG   ____________________________________________  RADIOLOGY   ____________________________________________   PROCEDURES  Procedure(s) performed:   Procedures  Critical Care performed:   ____________________________________________   INITIAL IMPRESSION / ASSESSMENT AND PLAN / ED COURSE  Pertinent labs & imaging results that were available during my care of the patient were reviewed by me and considered in my medical decision making (see chart for details).  DDX: Psychosis, anxiety, depression, adjustment disorder As part of my medical decision making, I reviewed the following data within the electronic MEDICAL RECORD NUMBER Notes from  prior ED visits  ----------------------------------------- 4:01 PM on 11/14/2018 -----------------------------------------  Patient evaluated by psychiatry, Dr. Viviano SimasMaurer, and recommended for inpatient treatment.  Patient with voluntary status.  Do not believe patient meets IVC criteria at this time. ____________________________________________   FINAL CLINICAL IMPRESSION(S) / ED DIAGNOSES  Anxiety and depression.  NEW MEDICATIONS STARTED DURING THIS VISIT:  New Prescriptions   No medications on file     Note:  This document was prepared using Dragon voice recognition software and may include unintentional dictation errors.     Myrna BlazerSchaevitz, Jaishon Krisher Matthew, MD 11/14/18 [phone]

## 2018-11-14 NOTE — ED Notes (Signed)
Changed pt out and will be walking back to room 23.

## 2018-11-14 NOTE — ED Notes (Signed)
Pt is putting all items she came in with and gave to sister Kennith [place] to take home.

## 2018-11-14 NOTE — ED Notes (Signed)
Pt verbalizes  "I normally take my methotrexate on Sundays"

## 2018-11-14 NOTE — ED Notes (Signed)
Pt given lunch tray with juice 

## 2018-11-14 NOTE — ED Notes (Signed)
Pt verbalizing  "The medication you gave me earlier has really helped but I need my pain medication now."
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
## 2018-11-15 ENCOUNTER — Inpatient Hospital Stay: Payer: Medicare HMO

## 2018-11-15 ENCOUNTER — Other Ambulatory Visit: Payer: Self-pay

## 2018-11-15 DIAGNOSIS — F332 Major depressive disorder, recurrent severe without psychotic features: Principal | ICD-10-CM

## 2018-11-15 IMAGING — CR DG CHEST 2V
2 series · 2 of 2 positions shown · non-contrast
Comparison: [DATE]

CLINICAL DATA: Behavioral medicine. History of COPD, coronary
artery disease.

EXAM:
CHEST - 2 VIEW

[chest pa]
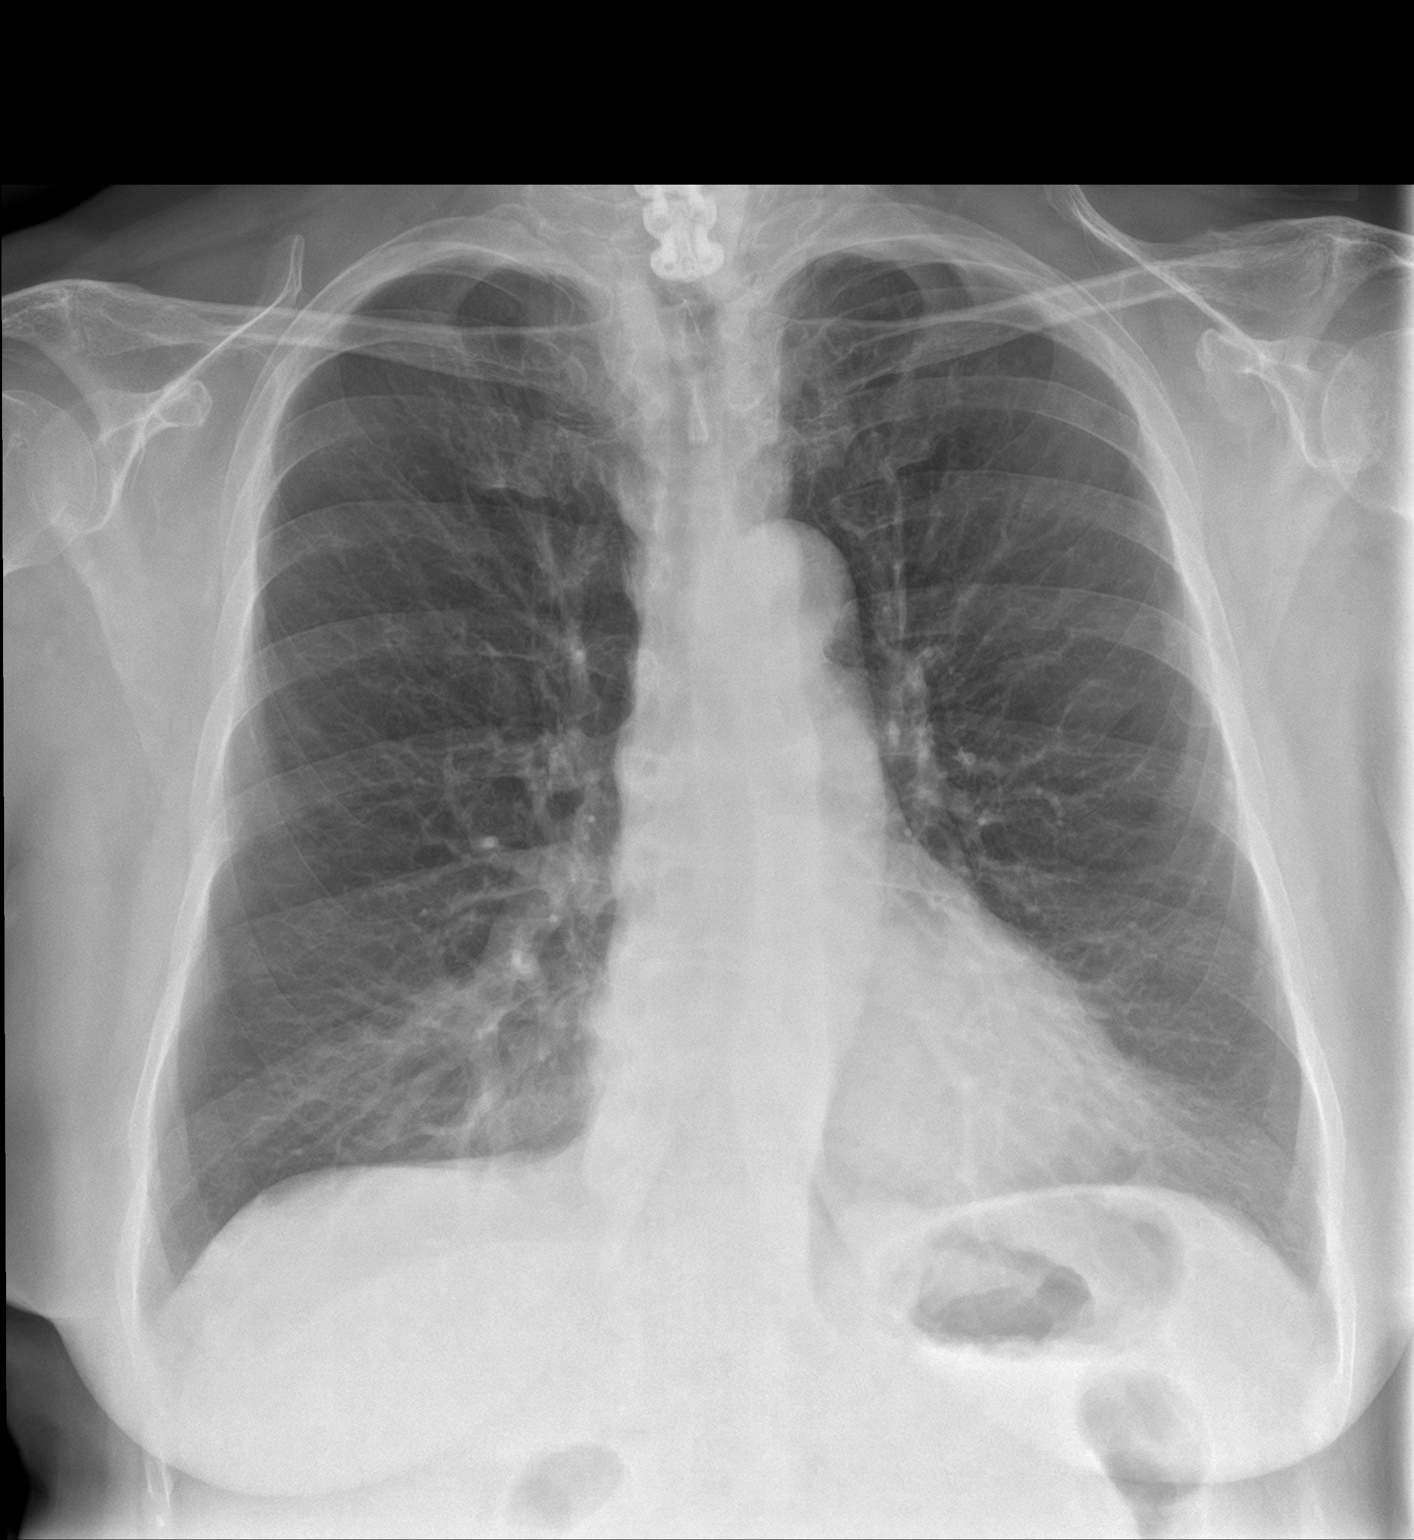

[chest lat]
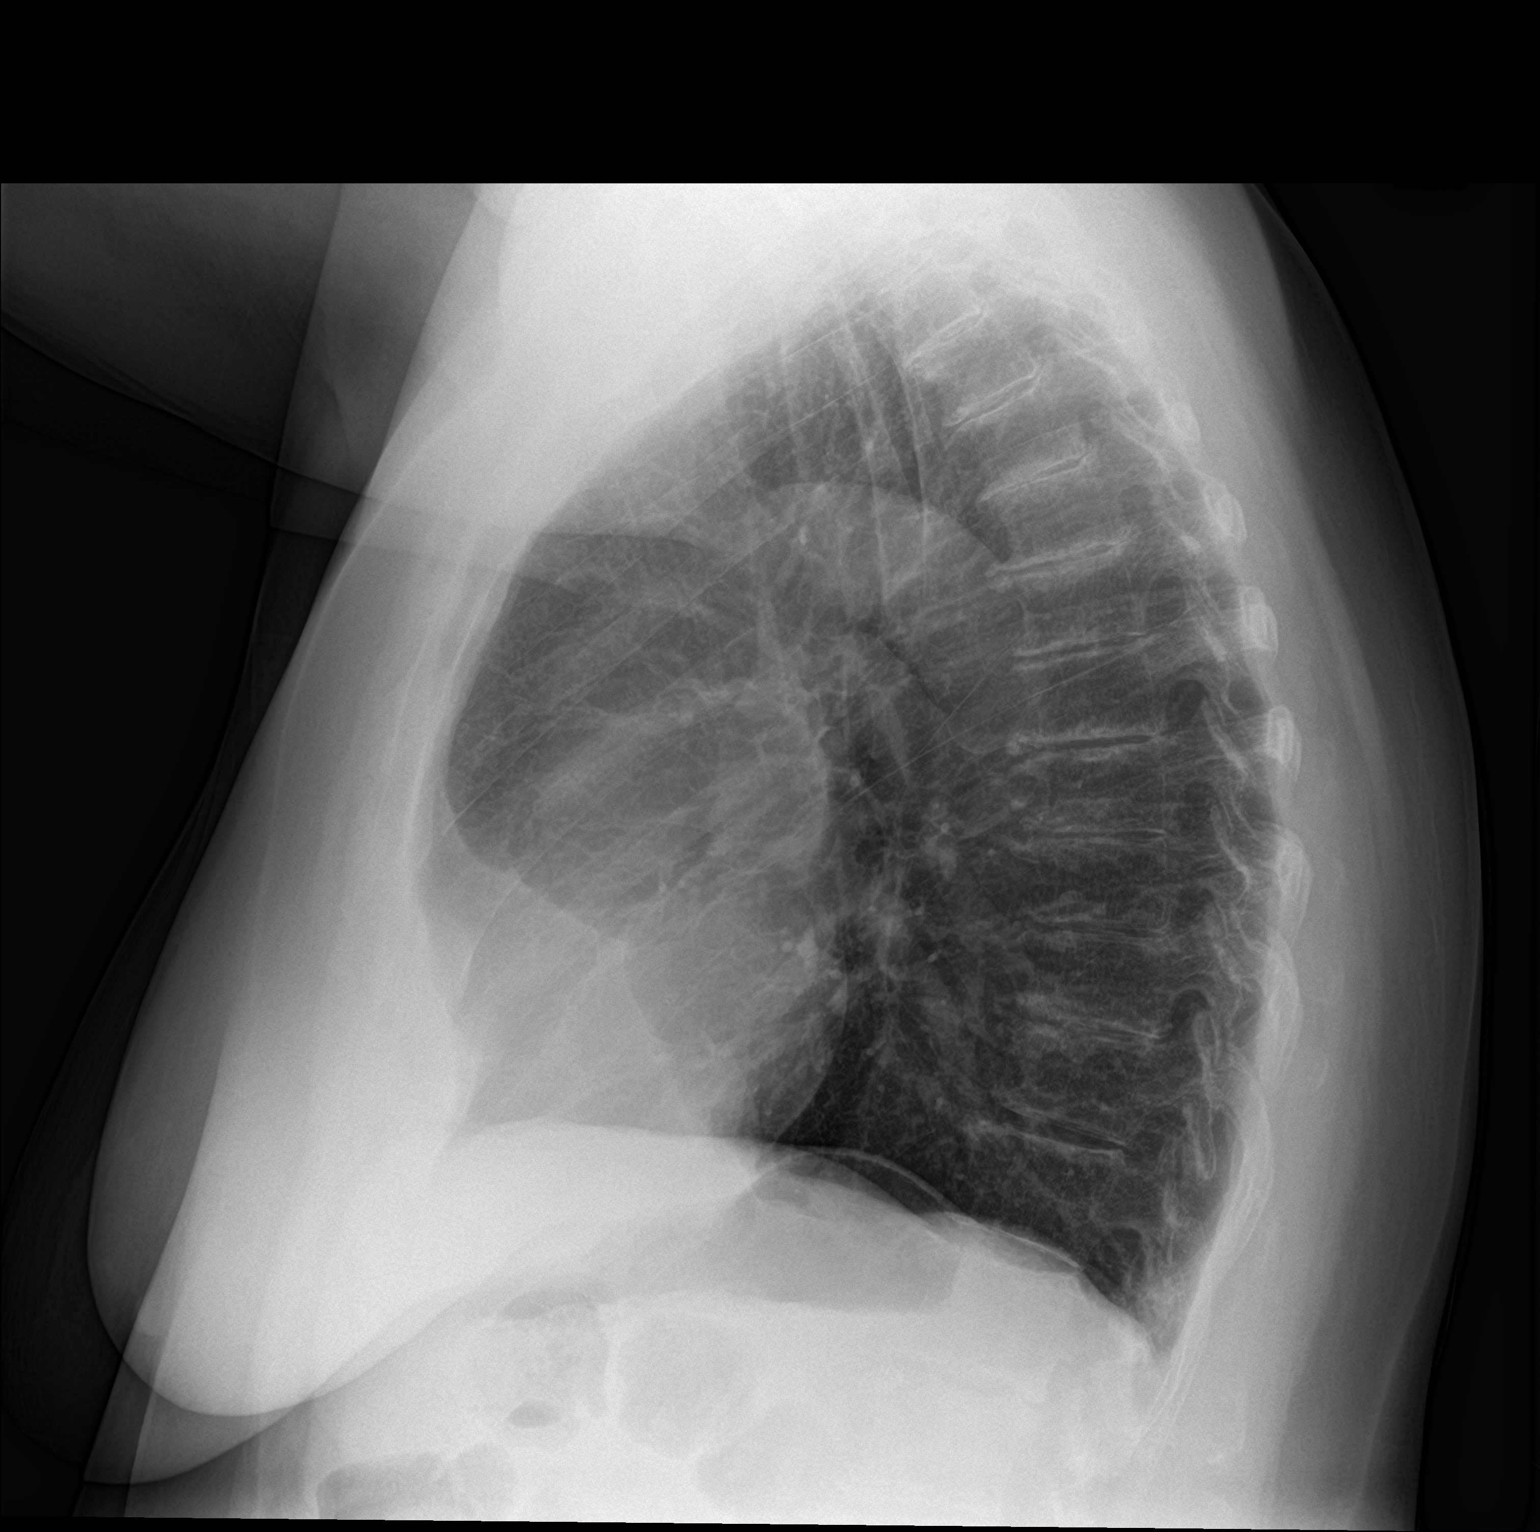

[2 of 2 positions shown; findings below may reference images not displayed]

FINDINGS: Heart and mediastinal contours are within normal limits. No focal
opacities or effusions. No acute bony abnormality.
IMPRESSION: No active cardiopulmonary disease.

## 2018-11-15 MED ORDER — NICOTINE 14 MG/24HR TD PT24
14.0000 mg | MEDICATED_PATCH | Freq: Every day | TRANSDERMAL | Status: DC
  Administered 2018-11-15 – 2018-11-20 (×6): 14 mg via TRANSDERMAL
  Filled 2018-11-15 (×7): qty 1

## 2018-11-15 NOTE — Plan of Care (Signed)
Patient newly admitted, hasn't had time to progress.    Problem: Education: Goal: Knowledge of  General Education information/materials will improve Outcome: Not Progressing Goal: Emotional status will improve Outcome: Not Progressing Goal: Mental status will improve Outcome: Not Progressing Goal: Verbalization of understanding the information provided will improve Outcome: Not Progressing   Problem: Safety: Goal: Periods of time without injury will increase Outcome: Not Progressing   Problem: Education: Goal: Utilization of techniques to improve thought processes will improve Outcome: Not Progressing Goal: Knowledge of the prescribed therapeutic regimen will improve Outcome: Not Progressing   Problem: Safety: Goal: Ability to disclose and discuss suicidal ideas will improve Outcome: Not Progressing Goal: Ability to identify and utilize support systems that promote safety will improve Outcome: Not Progressing   

## 2018-11-15 NOTE — Tx Team (Signed)
Interdisciplinary Treatment and Diagnostic Plan Update  11/15/2018 Time of Session: 10:30 Teresa Lang MRN: [id number]  Principal Diagnosis: <principal problem not specified>  Secondary Diagnoses: Active Problems:   MDD (major depressive disorder), recurrent severe, without psychosis (HCC)   Current Medications:  Current Facility-Administered Medications  Medication Dose Route Frequency Provider Last Rate Last Dose  . acetaminophen (TYLENOL) tablet 650 mg  650 mg Oral Q6H PRN Mariel CraftMaurer, Sheila M, MD      . albuterol (PROVENTIL) (2.5 MG/3ML) 0.083% nebulizer solution 3 mL  3 mL Inhalation Q6H PRN Mariel CraftMaurer, Sheila M, MD      . alum & mag hydroxide-simeth (MAALOX/MYLANTA) 200-200-20 MG/5ML suspension 30 mL  30 mL Oral Q4H PRN Mariel CraftMaurer, Sheila M, MD      . aspirin EC tablet 81 mg  81 mg Oral Daily Mariel CraftMaurer, Sheila M, MD   81 mg at 11/15/18 0745  . atorvastatin (LIPITOR) tablet 40 mg  40 mg Oral QPM Mariel CraftMaurer, Sheila M, MD      . Brexpiprazole TABS 3 mg  3 mg Oral QHS Mariel CraftMaurer, Sheila M, MD   3 mg at 11/14/18 2250  . busPIRone (BUSPAR) tablet 15 mg  15 mg Oral TID Mariel CraftMaurer, Sheila M, MD   15 mg at 11/15/18 0745  . diclofenac sodium (VOLTAREN) 1 % transdermal gel 2 g  2 g Topical QID PRN Mariel CraftMaurer, Sheila M, MD      . FLUoxetine (PROZAC) capsule 60 mg  60 mg Oral Daily Mariel CraftMaurer, Sheila M, MD   60 mg at 11/15/18 0745  . fluticasone furoate-vilanterol (BREO ELLIPTA) 100-25 MCG/INH 1 puff  1 puff Inhalation Daily Hallaji, Mardene SpeakSheema M, RPH   1 puff at 11/15/18 0746   And  . umeclidinium bromide (INCRUSE ELLIPTA) 62.5 MCG/INH 1 puff  1 puff Inhalation Daily Gardner CandleHallaji, Sheema M, RPH   1 puff at 11/15/18 0746  . folic acid (FOLVITE) tablet 1 mg  1 mg Oral Daily Mariel CraftMaurer, Sheila M, MD   1 mg at 11/15/18 0745  . isosorbide mononitrate (IMDUR) 24 hr tablet 30 mg  30 mg Oral BID Mariel CraftMaurer, Sheila M, MD   30 mg at 11/15/18 0748  . LORazepam (ATIVAN) tablet 1 mg  1 mg Oral Q6H PRN Mariel CraftMaurer, Sheila M, MD      . magnesium hydroxide (MILK OF  MAGNESIA) suspension 30 mL  30 mL Oral Daily PRN Mariel CraftMaurer, Sheila M, MD      . Melene Muller[START ON 11/17/2018] methotrexate (RHEUMATREX) tablet 15 mg  15 mg Oral Weekly Mariel CraftMaurer, Sheila M, MD      . metoprolol tartrate (LOPRESSOR) tablet 25 mg  25 mg Oral BID Mariel CraftMaurer, Sheila M, MD   25 mg at 11/15/18 0745  . mirtazapine (REMERON) tablet 30 mg  30 mg Oral QHS Mariel CraftMaurer, Sheila M, MD   30 mg at 11/14/18 2250  . olopatadine (PATANOL) 0.1 % ophthalmic solution 1 drop  1 drop Both Eyes BID Mariel CraftMaurer, Sheila M, MD   1 drop at 11/14/18 2250  . oxyCODONE (Oxy IR/ROXICODONE) immediate release tablet 5 mg  5 mg Oral TID Mariel CraftMaurer, Sheila M, MD   5 mg at 11/15/18 0745  . pantoprazole (PROTONIX) EC tablet 40 mg  40 mg Oral Daily Mariel CraftMaurer, Sheila M, MD   40 mg at 11/15/18 0745   PTA Medications: Medications Prior to Admission  Medication Sig Dispense Refill Last Dose  . albuterol (PROVENTIL HFA;VENTOLIN HFA) 108 (90 Base) MCG/ACT inhaler Inhale 2 puffs into the lungs every 6 (six) hours as  needed for wheezing or shortness of breath.    prn at prn  . aspirin EC 81 MG tablet Take 81 mg by mouth daily.   11/13/2018 at 2000  . atorvastatin (LIPITOR) 40 MG tablet Take 40 mg by mouth daily.   11/13/2018 at 2000  . Brexpiprazole 3 MG TABS Take 3 mg by mouth at bedtime.   11/13/2018 at 2000  . busPIRone (BUSPAR) 15 MG tablet Take 15 mg by mouth 3 (three) times daily.    11/14/2018 at 0700  . diclofenac sodium (VOLTAREN) 1 % GEL Apply 2 g topically 4 (four) times daily as needed (pain).   11/13/2018 at 2000  . FLUoxetine (PROZAC) 20 MG capsule Take 60 mg by mouth daily.    11/14/2018 at 0700  . folic acid (FOLVITE) 1 MG tablet Take 1 mg by mouth daily.   11/13/2018 at 2000  . ibuprofen (ADVIL,MOTRIN) 200 MG tablet Take 200 mg by mouth every 6 (six) hours as needed for mild pain.    prn at prn  . isosorbide mononitrate (IMDUR) 30 MG 24 hr tablet Take 30 mg by mouth 2 (two) times daily.   11/14/2018 at 0700  . lisinopril-hydrochlorothiazide (PRINZIDE,ZESTORETIC)  20-12.5 MG tablet Take 1 tablet by mouth 2 (two) times daily.    11/14/2018 at 0700  . methotrexate (RHEUMATREX) 2.5 MG tablet Take 15 mg by mouth once a week.    11/10/2018 at Unknown time  . metoprolol tartrate (LOPRESSOR) 25 MG tablet Take 25 mg by mouth 2 (two) times daily.   11/14/2018 at 0700  . mirtazapine (REMERON) 30 MG tablet Take 30 mg by mouth at bedtime.   11/13/2018 at 2100  . Olopatadine HCl 0.2 % SOLN Place 1 drop into both eyes daily.   prn at prn  . omeprazole (PRILOSEC) 40 MG capsule Take 40 mg by mouth daily.   Past Week at Unknown time  . oxyCODONE (OXY IR/ROXICODONE) 5 MG immediate release tablet Take 5 mg by mouth 3 (three) times daily.   11/14/2018 at 0700  . tiZANidine (ZANAFLEX) 4 MG tablet Take 4 mg by mouth 3 (three) times daily.    11/14/2018 at 0700  . TRELEGY ELLIPTA 100-62.5-25 MCG/INH AEPB Inhale 1 puff into the lungs daily.   11/13/2018 at 0700    Patient Stressors: Health problems Medication change or noncompliance Substance abuse  Patient Strengths: Ability for insight Barrister's clerk for treatment/growth  Treatment Modalities: Medication Management, Group therapy, Case management,  1 to 1 session with clinician, Psychoeducation, Recreational therapy.   Physician Treatment Plan for Primary Diagnosis: <principal problem not specified> Long Term Goal(s):     Short Term Goals:    Medication Management: Evaluate patient's response, side effects, and tolerance of medication regimen.  Therapeutic Interventions: 1 to 1 sessions, Unit Group sessions and Medication administration.  Evaluation of Outcomes: Progressing  Physician Treatment Plan for Secondary Diagnosis: Active Problems:   MDD (major depressive disorder), recurrent severe, without psychosis (HCC)  Long Term Goal(s):     Short Term Goals:       Medication Management: Evaluate patient's response, side effects, and tolerance of medication regimen.  Therapeutic Interventions: 1 to 1  sessions, Unit Group sessions and Medication administration.  Evaluation of Outcomes: Progressing   RN Treatment Plan for Primary Diagnosis: <principal problem not specified> Long Term Goal(s): Knowledge of disease and therapeutic regimen to maintain health will improve  Short Term Goals: Ability to demonstrate self-control, Ability to participate in decision making will improve,  Ability to verbalize feelings will improve and Ability to disclose and discuss suicidal ideas  Medication Management: RN will administer medications as ordered by provider, will assess and evaluate patient's response and provide education to patient for prescribed medication. RN will report any adverse and/or side effects to prescribing provider.  Therapeutic Interventions: 1 on 1 counseling sessions, Psychoeducation, Medication administration, Evaluate responses to treatment, Monitor vital signs and CBGs as ordered, Perform/monitor CIWA, COWS, AIMS and Fall Risk screenings as ordered, Perform wound care treatments as ordered.  Evaluation of Outcomes: Progressing   LCSW Treatment Plan for Primary Diagnosis: <principal problem not specified> Long Term Goal(s): Safe transition to appropriate next level of care at discharge, Engage patient in therapeutic group addressing interpersonal concerns.  Short Term Goals: Engage patient in aftercare planning with referrals and resources, Increase social support, Increase ability to appropriately verbalize feelings, Increase emotional regulation and Facilitate acceptance of mental health diagnosis and concerns  Therapeutic Interventions: Assess for all discharge needs, 1 to 1 time with Social worker, Explore available resources and support systems, Assess for adequacy in community support network, Educate family and significant other(s) on suicide prevention, Complete Psychosocial Assessment, Interpersonal group therapy.  Evaluation of Outcomes: Progressing   Progress in  Treatment: Attending groups: No. Participating in groups: No. Taking medication as prescribed: Yes. Toleration medication: Yes. Family/Significant other contact made: Yes, individual(s) contacted:  SPE completed with the patients sister.  Patient understands diagnosis: Yes. Discussing patient identified problems/goals with staff: Yes. Medical problems stabilized or resolved: Yes. Denies suicidal/homicidal ideation: Yes. Issues/concerns per patient self-inventory: No. Other: none  New problem(s) identified: No, Describe:  none  New Short Term/Long Term Goal(s): medication management for mood stabilization; elimination of SI thoughts; development of comprehensive mental wellness/sobriety plan.  Patient Goals:  "to feel better"  Discharge Plan or Barriers: Pt identifies none.  Pt is current with [facility].   Reason for Continuation of Hospitalization: Anxiety Depression Medication stabilization  Estimated Length of Stay: 1-5 days  Attendees: Patient: Teresa Lang 11/15/2018 11:55 AM  Physician: Dr. Jennet Maduro 11/15/2018 11:55 AM  Nursing: Trevor Iha, RN 11/15/2018 11:55 AM  RN Care Manager: 11/15/2018 11:55 AM  Social Worker: Penni Homans, MSW, LCSW 11/15/2018 11:55 AM  Recreational Therapist:  11/15/2018 11:55 AM  Other:  11/15/2018 11:55 AM  Other:  11/15/2018 11:55 AM  Other: 11/15/2018 11:55 AM    Scribe for Treatment Team: [name], LCSW 11/15/2018 11:55 AM

## 2018-11-15 NOTE — Consult Note (Signed)
  ECT: This is an ECT consult for this 63 year old woman with major depression.  Chart reviewed.  Spoke with patient this evening.  Communicated with Dr. Gean Birchwood.  Patient has been suffering from major depression intermittently for years.  Current episode has been bad for probably at least a year.  Has been on multiple antidepressant medicines with only partial benefit in the past and is currently on a combination of 2 antidepressants plus BuSpar plus brexpiprazole without much benefit.  Not currently having any psychotic symptoms.  Suicidal ideation without specific intent or plan.  Not actively abusing substances.  Patient has a history of coronary artery disease and COPD both of which appear to be stable right now.  Neatly dressed and groomed woman cooperative with the interview.  Good eye contact.  Psychomotor activity a little depressed.  Affect dysphoric some tearfulness mood depressed.  Thoughts lucid no evidence loosening of associations or delusions.  Suicidal thoughts without specific intent or plan no homicidal ideation.  Alert and oriented x4.  Good judgment and insight.  Based on diagnosis of major depression and failure to respond to more than 1 trial of affective antidepressants along with continued severe impairment the patient is a good candidate for ECT.  Cautionary factors include primarily her history of coronary artery disease and COPD however these are both currently stable.  Patient was given information and description of ECT.  Potential risks and benefits were discussed.  Patient was allowed to ask questions as much as she wanted.  At this point she gives tentative agreement to a plan to start ECT on Monday.  We would pursue right unilateral treatment.  I have sent a message to the ECT nurse and put in orders to complete the work-up and to have preparations in place.  We would not need to change any of her current medicine in preparation for ECT.

## 2018-11-15 NOTE — Progress Notes (Signed)
  LCSW Group Therapy Note  11/15/2018 1:00 PM  Type of Therapy and Topic:  Group Therapy:  Feelings around Relapse and Recovery  Participation Level:  Did Not Attend   Description of Group:    Patients in this group will discuss emotions they experience before and after a relapse. They will process how experiencing these feelings, or avoidance of experiencing them, relates to having a relapse. Facilitator will guide patients to explore emotions they have related to recovery. Patients will be encouraged to process which emotions are more powerful. They will be guided to discuss the emotional reaction significant others in their lives may have to their relapse or recovery. Patients will be assisted in exploring ways to respond to the emotions of others without this contributing to a relapse.  Therapeutic Goals: 1. Patient will identify two or more emotions that lead to a relapse for them 2. Patient will identify two emotions that result when they relapse 3. Patient will identify two emotions related to recovery 4. Patient will demonstrate ability to communicate their needs through discussion and/or role plays   Summary of Patient Progress:  X   Therapeutic Modalities:   Cognitive Behavioral Therapy Solution-Focused Therapy Assertiveness Training Relapse Prevention Therapy   Penni Homans, MSW, LCSW 11/15/2018 12:59 PM

## 2018-11-15 NOTE — Progress Notes (Signed)
Recreation Therapy Notes  Date: 11/15/2018  Time: 9:30 am   Location: Craft room   Behavioral response: N/A   Intervention Topic: Leisure  Discussion/Intervention: Patient did not attend group.   Clinical Observations/Feedback:  Patient did not attend group.   Naja Apperson LRT/CTRS        Mayda Shippee 11/15/2018 10:26 AM

## 2018-11-15 NOTE — Tx Team (Signed)
Initial Treatment Plan 11/15/2018 2:44 AM Estella HuskAngela Ruda JWJ:[id number]    PATIENT STRESSORS: Health problems Medication change or noncompliance Substance abuse   PATIENT STRENGTHS: Ability for insight Communication skills Motivation for treatment/growth   PATIENT IDENTIFIED PROBLEMS: Depression  Anxiety                   DISCHARGE CRITERIA:  Improved stabilization in mood, thinking, and/or behavior Motivation to continue treatment in a less acute level of care Verbal commitment to aftercare and medication compliance  PRELIMINARY DISCHARGE PLAN: Attend aftercare/continuing care group Outpatient therapy Return to previous living arrangement  PATIENT/FAMILY INVOLVEMENT: This treatment plan has been presented to and reviewed with the patient, Estella Huskngela Rebstock.  The patient has been given the opportunity to ask questions and make suggestions.  Elmyra RicksMatthew T Fendi Meinhardt, RN 11/15/2018, 2:44 AM
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
## 2018-11-15 NOTE — [facility] Suicide Risk Assessment (Signed)
[facility] INPATIENT:  Family/Significant Other Suicide Prevention Education  Suicide Prevention Education:  Education Completed; Teresa Lang, sister, [phone] has been identified by the patient as the family member/significant other with whom the patient will be residing, and identified as the person(s) who will aid the patient in the event of a mental health crisis (suicidal ideations/suicide attempt).  With written consent from the patient, the family member/significant other has been provided the following suicide prevention education, prior to the and/or following the discharge of the patient.  The suicide prevention education provided includes the following:  Suicide risk factors  Suicide prevention and interventions  National Suicide Hotline telephone number  [facility] assessment telephone number  [facility] Emergency Assistance 911  [facility] and/or Residential Mobile Crisis Unit telephone number  Request made of family/significant other to:  Remove weapons (e.g., guns, rifles, knives), all items previously/currently identified as safety concern.    Remove drugs/medications (over-the-counter, prescriptions, illicit drugs), all items previously/currently identified as a safety concern.  The family member/significant other verbalizes understanding of the suicide prevention education information provided.  The family member/significant other agrees to remove the items of safety concern listed above.  Teresa Lang 11/15/2018, 12:00 PM

## 2018-11-15 NOTE — Plan of Care (Signed)
Patient rated her depression 7/10 and anxiety 6/10.Denies SI,HI and AVH.Patient is pleasant and cooperative on approach.Patient stated that she likes to try ECT.Attended some groups.Appetite and energy level good.Support and encouragement given.

## 2018-11-15 NOTE — Progress Notes (Signed)
Pastoral Care Visit   11/15/18 1530  Clinical Encounter Type  Visited With Patient  Visit Type Initial  Referral From Nurse  Consult/Referral To Chaplain  Stress Factors  Patient Stress Factors None identified   Pt denied interest in having chap visit. Chap notified pt that a chap is available 24/7 to provide spiritual or emotional care.  Milinda Antis, [address]
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
## 2018-11-15 NOTE — [facility] Suicide Risk Assessment (Signed)
[facility] Admission Suicide Risk Assessment   Nursing information obtained from:  Patient Demographic factors:  NA Current Mental Status:  NA Loss Factors:  NA Historical Factors:  NA Risk Reduction Factors:  Positive social support  Total Time spent with patient: 1 hour Principal Problem: MDD (major depressive disorder), recurrent severe, without psychosis (HCC) Diagnosis:  Principal Problem:   MDD (major depressive disorder), recurrent severe, without psychosis (HCC) Active Problems:   PTSD (post-traumatic stress disorder)   Fibromyalgia   Hypertension, essential   Long term current use of opiate analgesic   Tobacco use disorder  Subjective Data: depression  Continued Clinical Symptoms:  Alcohol Use Disorder Identification Test Final Score (AUDIT): 0 The "Alcohol Use Disorders Identification Test", Guidelines for Use in Primary Care, Second Edition.  World Science writer [facility]). Score between 0-7:  no or low risk or alcohol related problems. Score between 8-15:  moderate risk of alcohol related problems. Score between 16-19:  high risk of alcohol related problems. Score 20 or above:  warrants further diagnostic evaluation for alcohol dependence and treatment.   CLINICAL FACTORS:   Severe Anxiety and/or Agitation Depression:   Insomnia Severe Chronic Pain   Musculoskeletal: Strength & Muscle Tone: within normal limits Gait & Station: normal Patient leans: N/A  Psychiatric Specialty Exam: Physical Exam  Nursing note and vitals reviewed. Psychiatric: Her affect is blunt. Her speech is delayed. She is slowed and withdrawn. Cognition and memory are normal. She expresses impulsivity. She exhibits a depressed mood.    Review of Systems  Musculoskeletal: Positive for back pain, myalgias and neck pain.  Neurological: Negative.   All other systems reviewed and are negative.   Blood pressure 139/86, pulse 78, temperature 98.8 F (37.1 C), temperature source Oral, resp.  rate 18, height 5\' 5"  (1.651 m), weight 83.5 kg, SpO2 95 %.Body mass index is 30.63 kg/m.  General Appearance: Casual  Eye Contact:  Good  Speech:  Clear and Coherent  Volume:  Decreased  Mood:  Anxious and Depressed  Affect:  Blunt and Constricted  Thought Process:  Goal Directed and Descriptions of Associations: Intact  Orientation:  Full (Time, Place, and Person)  Thought Content:  WDL  Suicidal Thoughts:  No  Homicidal Thoughts:  No  Memory:  Immediate;   Fair Recent;   Fair Remote;   Fair  Judgement:  Fair  Insight:  Present  Psychomotor Activity:  Psychomotor Retardation  Concentration:  Concentration: Fair and Attention Span: Fair  Recall:  [employer] of Knowledge:  Fair  Language:  Fair  Akathisia:  No  Handed:  Right  AIMS (if indicated):     Assets:  Communication Skills Desire for Improvement Financial Resources/Insurance Housing Resilience Social Support Transportation  ADL's:  Impaired  Cognition:  WNL  Sleep:  Number of Hours: 6.5      COGNITIVE FEATURES THAT CONTRIBUTE TO RISK:  None    SUICIDE RISK:   Moderate:  Frequent suicidal ideation with limited intensity, and duration, some specificity in terms of plans, no associated intent, good self-control, limited dysphoria/symptomatology, some risk factors present, and identifiable protective factors, including available and accessible social support.  PLAN OF CARE: hospital admission, medication management, discharge planning.  Ms. 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   #Depression -continue Prozac 60 mg -BuSpar 15 mg BID -Remeron 30 mg nightly -Trazodone 100 mg nightly -consider ECT  #Chronic pain -continue all pain medications as prescribed by pain clinic  #HTN -Metoprolol 25 mg  BID  #RA -Methotrexate 15 mg daily  #Disposition -discharge to home -follow up with Dr[facility]jean Mode at [facility]    I certify that inpatient services furnished can reasonably  be expected to improve the patient's condition.   Kristine Linea, MD 11/15/2018, 3:58 PM

## 2018-11-15 NOTE — H&P (Signed)
Psychiatric Admission Assessment Adult  Patient Identification: Teresa Lang MRN:  [id number] Date of Evaluation:  11/15/2018 Chief Complaint:  MDD recurrent severe Principal Diagnosis: MDD (major depressive disorder), recurrent severe, without psychosis (HCC) Diagnosis:  Principal Problem:   MDD (major depressive disorder), recurrent severe, without psychosis (HCC) Active Problems:   PTSD (post-traumatic stress disorder)   Fibromyalgia   Hypertension, essential   Long term current use of opiate analgesic   Tobacco use disorder  History of Present Illness:   Teresa Lang is a 63 year old female with a history of severe depression.  Chief complaint. "I can not function."  History of present illness. Information was obtained from the patient and the chart. The patient came to the ER complaining of unrelenting depression. The patient has a long history of depression that in the past few months has gotten out of control in spite of attempts to treat. The patient has been working with Dr. Georjean Mode at [facility] but remains severely depressed and completely dysfunctional. She has not been able to take care of household chores or  Take care of herself. She is insomniac and incapacitated by paim. She lost weight. She denies psychotic symptoms but endorses panic attacks, social anxiety, nightmares and flashbacks of PTSD and mild OCD symptoms. She does not use drugs or alcohol.  Past psychiatric history. Depressed since teenager. Several hospitalizations when young. One suicide attempt by overdose long time ago. Tried on all medications I could name except mood stabilizers.  Family psychiatric history. Mother and sister with bipolar.  Social history. Lives with her sister.  Total Time spent with patient: 1 hour  Is the patient at risk to self? Yes.    Has the patient been a risk to self in the past 6 months? No.  Has the patient been a risk to self within the distant past? Yes.    Is the patient a risk to  others? No.  Has the patient been a risk to others in the past 6 months? No.  Has the patient been a risk to others within the distant past? No.   Prior Inpatient Therapy:   Prior Outpatient Therapy:    Alcohol Screening: 1. How often do you have a drink containing alcohol?: Never 2. How many drinks containing alcohol do you have on a typical day when you are drinking?: 1 or 2 3. How often do you have six or more drinks on one occasion?: Never AUDIT-C Score: 0 4. How often during the last year have you found that you were not able to stop drinking once you had started?: Never 5. How often during the last year have you failed to do what was normally expected from you becasue of drinking?: Never 6. How often during the last year have you needed a first drink in the morning to get yourself going after a heavy drinking session?: Never 7. How often during the last year have you had a feeling of guilt of remorse after drinking?: Never 8. How often during the last year have you been unable to remember what happened the night before because you had been drinking?: Never 9. Have you or someone else been injured as a result of your drinking?: No 10. Has a relative or friend or a doctor or another health worker been concerned about your drinking or suggested you cut down?: No Alcohol Use Disorder Identification Test Final Score (AUDIT): 0 Alcohol Brief Interventions/Follow-up: AUDIT Score <7 follow-up not indicated Substance Abuse History in the last 12 months:  No.  Consequences of Substance Abuse: NA Previous Psychotropic Medications: Yes  Psychological Evaluations: No  Past Medical History:  Past Medical History:  Diagnosis Date  . Allergy    seasonal  . Anxiety   . Arthritis   . Depression   . Hyperlipidemia   . Hypertension     Past Surgical History:  Procedure Laterality Date  . ABDOMINAL HYSTERECTOMY  1983  . CHOLECYSTECTOMY  1983  . SPINE SURGERY  beginning 1994   laminectomies,  fusions   Family History:  Family History  Problem Relation Age of Onset  . Celiac disease Sister   . Breast cancer Neg Hx     Tobacco Screening: Have you used any form of tobacco in the last 30 days? (Cigarettes, Smokeless Tobacco, Cigars, and/or Pipes): Yes Tobacco use, Select all that apply: 4 or less cigarettes per day Are you interested in Tobacco Cessation Medications?: No, patient refused Counseled patient on smoking cessation including recognizing danger situations, developing coping skills and basic information about quitting provided: Refused/Declined practical counseling Social History:  Social History   Substance and Sexual Activity  Alcohol Use Never  . Frequency: Never     Social History   Substance and Sexual Activity  Drug Use Yes  . Frequency: 7.0 times per week  . Types: Marijuana   Comment: "maybe once in the evening"; also uses "KRATUM, the herb, to helps with pain and anxiety"    Additional Social History: Marital status: Widowed(Pt has been married three times and divorced twice.  ) Are you sexually active?: No What is your sexual orientation?: Heterosexual Has your sexual activity been affected by drugs, alcohol, medication, or emotional stress?: Pt denies. Does patient have children?: Yes How many children?: 1                         Allergies:   Allergies  Allergen Reactions  . Prednisone Anxiety   Lab Results:  Results for orders placed or performed during the hospital encounter of 11/14/18 (from the past 48 hour(s))  Comprehensive metabolic panel     Status: Abnormal   Collection Time: 11/14/18 11:59 AM  Result Value Ref Range   Sodium 139 135 - 145 mmol/L   Potassium 3.3 (L) 3.5 - 5.1 mmol/L   Chloride 101 98 - 111 mmol/L   CO2 32 22 - 32 mmol/L   Glucose, Bld 138 (H) 70 - 99 mg/dL   BUN 19 8 - 23 mg/dL   Creatinine, Ser 1.61 0.44 - 1.00 mg/dL   Calcium 9.7 8.9 - 09.6 mg/dL   Total Protein 7.2 6.5 - 8.1 g/dL   Albumin 3.9  3.5 - 5.0 g/dL   AST 28 15 - 41 U/L   ALT 24 0 - 44 U/L   Alkaline Phosphatase 59 38 - 126 U/L   Total Bilirubin 1.2 0.3 - 1.2 mg/dL   GFR calc non Af Amer >60 >60 mL/min   GFR calc Af Amer >60 >60 mL/min   Anion gap 6 5 - 15    Comment: Performed at [facility], [address], [place], [place] [address]  Ethanol     Status: None   Collection Time: 11/14/18 11:59 AM  Result Value Ref Range   Alcohol, Ethyl (B) <10 <10 mg/dL    Comment: (NOTE) Lowest detectable limit for serum alcohol is 10 mg/dL. For medical purposes only. Performed at [facility], [address] [place], [place] [address]   Salicylate level  Status: None   Collection Time: 11/14/18 11:59 AM  Result Value Ref Range   Salicylate Lvl <7.0 2.8 - 30.0 mg/dL    Comment: Performed at [facility], [address] [place], [place] [address]  Acetaminophen level     Status: Abnormal   Collection Time: 11/14/18 11:59 AM  Result Value Ref Range   Acetaminophen (Tylenol), Serum <10 (L) 10 - 30 ug/mL    Comment: (NOTE) Therapeutic concentrations vary significantly. A range of 10-30 ug/mL  may be an effective concentration for many patients. However, some  are best treated at concentrations outside of this range. Acetaminophen concentrations >150 ug/mL at 4 hours after ingestion  and >50 ug/mL at 12 hours after ingestion are often associated with  toxic reactions. Performed at [facility], [address] [place], [place] [address]   cbc     Status: None   Collection Time: 11/14/18 11:59 AM  Result Value Ref Range   WBC 7.9 4.0 - 10.5 K/uL   RBC 4.49 3.87 - 5.11 MIL/uL   Hemoglobin 14.1 12.0 - 15.0 g/dL   HCT 28.3 66.2 - 94.7 %   MCV 94.0 80.0 - 100.0 fL   MCH 31.4 26.0 - 34.0 pg   MCHC 33.4 30.0 - 36.0 g/dL   RDW 65.4 65.0 - 35.4 %   Platelets 281 150 - 400 K/uL   nRBC 0.0 0.0 - 0.2 %    Comment: Performed at [facility], [address]  [place], [place] [address]    Blood Alcohol level:  Lab Results  Component Value Date   [facility] <10 11/14/2018    Metabolic Disorder Labs:  No results found for: HGBA1C, MPG No results found for: PROLACTIN No results found for: CHOL, TRIG, HDL, CHOLHDL, VLDL, LDLCALC  Current Medications: Current Facility-Administered Medications  Medication Dose Route Frequency Provider Last Rate Last Dose  . acetaminophen (TYLENOL) tablet 650 mg  650 mg Oral Q6H PRN Mariel Craft, MD      . albuterol (PROVENTIL) (2.5 MG/3ML) 0.083% nebulizer solution 3 mL  3 mL Inhalation Q6H PRN Mariel Craft, MD      . alum & mag hydroxide-simeth (MAALOX/MYLANTA) 200-200-20 MG/5ML suspension 30 mL  30 mL Oral Q4H PRN Mariel Craft, MD      . aspirin EC tablet 81 mg  81 mg Oral Daily Mariel Craft, MD   81 mg at 11/15/18 0745  . atorvastatin (LIPITOR) tablet 40 mg  40 mg Oral QPM Mariel Craft, MD      . Brexpiprazole TABS 3 mg  3 mg Oral QHS Mariel Craft, MD   3 mg at 11/14/18 2250  . busPIRone (BUSPAR) tablet 15 mg  15 mg Oral TID Mariel Craft, MD   15 mg at 11/15/18 1211  . diclofenac sodium (VOLTAREN) 1 % transdermal gel 2 g  2 g Topical QID PRN Mariel Craft, MD      . FLUoxetine (PROZAC) capsule 60 mg  60 mg Oral Daily Mariel Craft, MD   60 mg at 11/15/18 0745  . fluticasone furoate-vilanterol (BREO ELLIPTA) 100-25 MCG/INH 1 puff  1 puff Inhalation Daily Hallaji, Mardene Speak, RPH   1 puff at 11/15/18 0746   And  . umeclidinium bromide (INCRUSE ELLIPTA) 62.5 MCG/INH 1 puff  1 puff Inhalation Daily Gardner Candle, RPH   1 puff at 11/15/18 0746  . folic acid (FOLVITE) tablet 1 mg  1 mg Oral Daily Viviano Simas,  Orlean Bradford, MD   1 mg at 11/15/18 0745  . isosorbide mononitrate (IMDUR) 24 hr tablet 30 mg  30 mg Oral BID Mariel Craft, MD   30 mg at 11/15/18 0748  . LORazepam (ATIVAN) tablet 1 mg  1 mg Oral Q6H PRN Mariel Craft, MD      . magnesium hydroxide (MILK OF MAGNESIA) suspension 30 mL  30  mL Oral Daily PRN Mariel Craft, MD      . Melene Muller ON 11/17/2018] methotrexate (RHEUMATREX) tablet 15 mg  15 mg Oral Weekly Mariel Craft, MD      . metoprolol tartrate (LOPRESSOR) tablet 25 mg  25 mg Oral BID Mariel Craft, MD   25 mg at 11/15/18 0745  . mirtazapine (REMERON) tablet 30 mg  30 mg Oral QHS Mariel Craft, MD   30 mg at 11/14/18 2250  . olopatadine (PATANOL) 0.1 % ophthalmic solution 1 drop  1 drop Both Eyes BID Mariel Craft, MD   1 drop at 11/14/18 2250  . oxyCODONE (Oxy IR/ROXICODONE) immediate release tablet 5 mg  5 mg Oral TID Mariel Craft, MD   5 mg at 11/15/18 1211  . pantoprazole (PROTONIX) EC tablet 40 mg  40 mg Oral Daily Mariel Craft, MD   40 mg at 11/15/18 0745   PTA Medications: Medications Prior to Admission  Medication Sig Dispense Refill Last Dose  . albuterol (PROVENTIL HFA;VENTOLIN HFA) 108 (90 Base) MCG/ACT inhaler Inhale 2 puffs into the lungs every 6 (six) hours as needed for wheezing or shortness of breath.    prn at prn  . aspirin EC 81 MG tablet Take 81 mg by mouth daily.   11/13/2018 at 2000  . atorvastatin (LIPITOR) 40 MG tablet Take 40 mg by mouth daily.   11/13/2018 at 2000  . Brexpiprazole 3 MG TABS Take 3 mg by mouth at bedtime.   11/13/2018 at 2000  . busPIRone (BUSPAR) 15 MG tablet Take 15 mg by mouth 3 (three) times daily.    11/14/2018 at 0700  . diclofenac sodium (VOLTAREN) 1 % GEL Apply 2 g topically 4 (four) times daily as needed (pain).   11/13/2018 at 2000  . FLUoxetine (PROZAC) 20 MG capsule Take 60 mg by mouth daily.    11/14/2018 at 0700  . folic acid (FOLVITE) 1 MG tablet Take 1 mg by mouth daily.   11/13/2018 at 2000  . ibuprofen (ADVIL,MOTRIN) 200 MG tablet Take 200 mg by mouth every 6 (six) hours as needed for mild pain.    prn at prn  . isosorbide mononitrate (IMDUR) 30 MG 24 hr tablet Take 30 mg by mouth 2 (two) times daily.   11/14/2018 at 0700  . lisinopril-hydrochlorothiazide (PRINZIDE,ZESTORETIC) 20-12.5 MG tablet Take 1  tablet by mouth 2 (two) times daily.    11/14/2018 at 0700  . methotrexate (RHEUMATREX) 2.5 MG tablet Take 15 mg by mouth once a week.    11/10/2018 at Unknown time  . metoprolol tartrate (LOPRESSOR) 25 MG tablet Take 25 mg by mouth 2 (two) times daily.   11/14/2018 at 0700  . mirtazapine (REMERON) 30 MG tablet Take 30 mg by mouth at bedtime.   11/13/2018 at 2100  . Olopatadine HCl 0.2 % SOLN Place 1 drop into both eyes daily.   prn at prn  . omeprazole (PRILOSEC) 40 MG capsule Take 40 mg by mouth daily.   Past Week at Unknown time  . oxyCODONE (OXY IR/ROXICODONE) 5 MG  immediate release tablet Take 5 mg by mouth 3 (three) times daily.   11/14/2018 at 0700  . tiZANidine (ZANAFLEX) 4 MG tablet Take 4 mg by mouth 3 (three) times daily.    11/14/2018 at 0700  . TRELEGY ELLIPTA 100-62.5-25 MCG/INH AEPB Inhale 1 puff into the lungs daily.   11/13/2018 at 0700    Musculoskeletal: Strength & Muscle Tone: within normal limits Gait & Station: normal Patient leans: N/A  Psychiatric Specialty Exam: I reviewed physical examination performed in the ER and agree with thr findings. Physical Exam  Nursing note and vitals reviewed. Psychiatric: Judgment and thought content normal. Her affect is blunt. Her speech is delayed. She is slowed and withdrawn. Cognition and memory are normal. She exhibits a depressed mood.    Review of Systems  Musculoskeletal: Positive for back pain, myalgias and neck pain.  Neurological: Negative.   Psychiatric/Behavioral: Negative.   All other systems reviewed and are negative.   Blood pressure 139/86, pulse 78, temperature 98.8 F (37.1 C), temperature source Oral, resp. rate 18, height 5\' 5"  (1.651 m), weight 83.5 kg, SpO2 95 %.Body mass index is 30.63 kg/m.   See SRA.                                                  Sleep:  Number of Hours: 6.5    Treatment Plan Summary: Daily contact with patient to assess and evaluate symptoms and progress in treatment  and Medication management  Observation Level/Precautions:  15 minute checks  Laboratory:  CBC Chemistry Profile UDS UA  Psychotherapy:    Medications:    Consultations:    Discharge Concerns:    Estimated LOS:  Other:     Physician Treatment Plan for Primary Diagnosis: MDD (major depressive disorder), recurrent severe, without psychosis (HCC) Long Term Goal(s): Improvement in symptoms so as ready for discharge  Short Term Goals: Ability to identify changes in lifestyle to reduce recurrence of condition will improve, Ability to verbalize feelings will improve, Ability to disclose and discuss suicidal ideas, Ability to demonstrate self-control will improve, Ability to identify and develop effective coping behaviors will improve, Ability to maintain clinical measurements within normal limits will improve and Ability to identify triggers associated with substance abuse/mental health issues will improve  Physician Treatment Plan for Secondary Diagnosis: Principal Problem:   MDD (major depressive disorder), recurrent severe, without psychosis (HCC) Active Problems:   PTSD (post-traumatic stress disorder)   Fibromyalgia   Hypertension, essential   Long term current use of opiate analgesic   Tobacco use disorder  Long Term Goal(s): Improvement in symptoms so as ready for discharge  Short Term Goals: NA  I certify that inpatient services furnished can reasonably be expected to improve the patient's condition.    Kristine LineaJolanta Ohanna Gassert, MD 2/7/20204:18 PM

## 2018-11-16 DIAGNOSIS — F4310 Post-traumatic stress disorder, unspecified: Secondary | ICD-10-CM | POA: Diagnosis not present

## 2018-11-16 DIAGNOSIS — F332 Major depressive disorder, recurrent severe without psychotic features: Secondary | ICD-10-CM | POA: Diagnosis not present

## 2018-11-16 DIAGNOSIS — Z79891 Long term (current) use of opiate analgesic: Secondary | ICD-10-CM

## 2018-11-16 DIAGNOSIS — F17200 Nicotine dependence, unspecified, uncomplicated: Secondary | ICD-10-CM

## 2018-11-16 DIAGNOSIS — M797 Fibromyalgia: Secondary | ICD-10-CM

## 2018-11-16 MED ORDER — WHITE PETROLATUM EX OINT
TOPICAL_OINTMENT | CUTANEOUS | Status: DC | PRN
  Filled 2018-11-16 (×2): qty 5

## 2018-11-16 NOTE — Plan of Care (Signed)
  Problem: Education: Goal: Knowledge of [facility] Education information/materials will improve Note:  Verbalize  understanding of information received  from staff regarding  [place] . Encourage   hand  washing .  Goal: Emotional status will improve Note:  Emotional and mental status improving    Problem: Safety: Goal: Periods of time without injury will increase Note:  Voice of no safety concerns    Problem: Education: Goal: Utilization of techniques to improve thought processes will improve Note:  Encourage  group attendance,  to help  with verbalization  of  feeling  Goal: Knowledge of the prescribed therapeutic regimen will improve Note:  Aware of all medication she is currently on .   Problem: Safety: Goal: Ability to disclose and discuss suicidal ideas will improve Note:  Denies  suicidal ideations

## 2018-11-16 NOTE — Plan of Care (Signed)
Patient is alert and oriented x 4 , calm and behavior appropriate no out bust . Patient has insights into circumstances that lead to hospitalization, patient socializing with peers but very minimal.  Patient expresses depressed mood and affect is moderately blunt, she is slowed and withdrawn , cognition and memory are adequate , she expresses impulsivity , denies any SI/HIAVH, .  Encourage patient to continue treatment plan , appetite is good, sleep is adequate and continuous, patient contract for safety, encourage interaction with peers , 15 minute safety rounding is in progress , no distress noted. Problem: Education: Goal: Knowledge of [facility] Education information/materials will improve Outcome: Progressing Goal: Emotional status will improve Outcome: Progressing Goal: Mental status will improve Outcome: Progressing Goal: Verbalization of understanding the information provided will improve Outcome: Progressing   Problem: Safety: Goal: Periods of time without injury will increase Outcome: Progressing   Problem: Education: Goal: Utilization of techniques to improve thought processes will improve Outcome: Progressing Goal: Knowledge of the prescribed therapeutic regimen will improve Outcome: Progressing   Problem: Safety: Goal: Ability to disclose and discuss suicidal ideas will improve Outcome: Progressing Goal: Ability to identify and utilize support systems that promote safety will improve Outcome: Progressing

## 2018-11-16 NOTE — [facility] Group Notes (Signed)
LCSW Group Therapy Note  11/16/2018 1:15pm  Type of Therapy and Topic: Group Therapy: Holding on to Grudges   Participation Level: Active   Description of Group:  In this group patients will be asked to explore and define a grudge. Patients will be guided to discuss their thoughts, feelings, and reasons as to why people have grudges. Patients will process the impact grudges have on daily life and identify thoughts and feelings related to holding grudges. Facilitator will challenge patients to identify ways to let go of grudges and the benefits this provides. Patients will be confronted to address why one struggles letting go of grudges. Lastly, patients will identify feelings and thoughts related to what life would look like without grudges. This group will be process-oriented, with patients participating in exploration of their own experiences, giving and receiving support, and processing challenge from other group members.  Therapeutic Goals:  1. Patient will identify specific grudges related to their personal life.  2. Patient will identify feelings, thoughts, and beliefs around grudges.  3. Patient will identify how one releases grudges appropriately.  4. Patient will identify situations where they could have let go of the grudge, but instead chose to hold on.   Summary of Patient Progress: The patient reported that  she feels "hopeful." Patients were guided to discuss their thoughts, feelings, and reasons as to why people have grudges. The patient was able to process the impact grudges have on daily life and identified thoughts and feelings related to holding grudges. The patient was challenged to identify ways to let go of grudges and the benefits this provides. Pt. actively and appropriately engaged in the group. Patient was able to provide support and validation to other group members.   Therapeutic Modalities:  Cognitive Behavioral Therapy  Solution Focused Therapy  Motivational  Interviewing  Brief Therapy   Jacalynn Buzzell  CUEBAS-COLON, LCSW 11/16/2018 9:56 AM

## 2018-11-16 NOTE — Progress Notes (Signed)
D: Patient stated slept good last night .Stated appetite fair and energy level  low. Stated concentration is good . Stated on Depression scale 6 , hopeless 4 and anxiety4 .( low 0-10 high) Denies suicidal  homicidal ideations  .  No auditory hallucinations  No pain concerns . Appropriate ADL'S. Interacting with peers and staff.  Patient sister wanted to take picture  Of patient  Neck . Inform patient    Staff cannot permit pictures  On the unit   A: Encourage patient participation with unit programming . Instruction  Given on  Medication , verbalize understanding.Verbalize understanding of information received from staff regarding [place]. Encourage hand washing. Emotional and mental status improving  Voice of no safety concerns Encourage group attendance, to help with verbalization of feeling Aware of all medication she is currently on. Denies  suicidal ideations    R: Voice no other concerns. Staff continue to monitor

## 2018-11-16 NOTE — Progress Notes (Signed)
[facility] MD Progress Note  11/16/2018 10:53 AM Teresa Lang  MRN:  [id number] Subjective: "I cannot stop thinking about what happened to me the other day". Principal Problem: MDD (major depressive disorder), recurrent severe, without psychosis (HCC) Diagnosis: Principal Problem:   MDD (major depressive disorder), recurrent severe, without psychosis (HCC) Active Problems:   PTSD (post-traumatic stress disorder)   Fibromyalgia   Hypertension, essential   Long term current use of opiate analgesic   Tobacco use disorder  Total Time spent with patient: 45 minutes   Teresa Lang is a 63 year old female with a history of severe depression.  History of present illness. Information was obtained from the patient and the chart. The patient came to the ER complaining of unrelenting depression. The patient has a long history of depression that in the past few months has gotten out of control in spite of attempts to treat. The patient has been working with Dr. Georjean Mode at [facility] but remains severely depressed and completely dysfunctional. She has not been able to take care of household chores or  Take care of herself. She is insomniac and incapacitated by pain. She lost weight. She denies psychotic symptoms but endorses panic attacks, social anxiety, nightmares and flashbacks of PTSD and mild OCD symptoms. She does not use drugs or alcohol.  Past psychiatric history. Depressed since teenager. Several hospitalizations when young. One suicide attempt by overdose long time ago. Tried on all medications I could name except mood stabilizers.  On evaluation today, patient reports that she has slept really well since being on the unit.  She has had difficulty being active in the milieu and attending groups, due to anxiety.  She is still easily tearful and feels guilty about having poor relationships with her children.  She wonders if her depression will ever improve, however she is hopeful that ECT will be helpful.  Patient  reflects upon her most recent trauma and is able to relate how this will force her to handle all of her past traumas.  She notes she is afraid, and has sometimes not felt safe on the unit.  Despite that, she now realizes that she does not want to die.  She denies any suicidal plan or intent at this time.  She is denying any homicidal ideation.  She denies any auditory visual hallucinations.  Patient has continued on home medications, and is denying any side effect.  Patient is agreeable to doing homework assignment of what happiness looks like to me.    Past Medical History:  Past Medical History:  Diagnosis Date  . Allergy    seasonal  . Anxiety   . Arthritis   . Depression   . Hyperlipidemia   . Hypertension     Past Surgical History:  Procedure Laterality Date  . ABDOMINAL HYSTERECTOMY  1983  . CHOLECYSTECTOMY  1983  . SPINE SURGERY  beginning 1994   laminectomies, fusions   Family History:  Family History  Problem Relation Age of Onset  . Celiac disease Sister   . Breast cancer Neg Hx    Family Psychiatric  History: See H&P Social History:  Social History   Substance and Sexual Activity  Alcohol Use Never  . Frequency: Never     Social History   Substance and Sexual Activity  Drug Use Yes  . Frequency: 7.0 times per week  . Types: Marijuana   Comment: "maybe once in the evening"; also uses "KRATUM, the herb, to helps with pain and anxiety"    Social History  Socioeconomic History  . Marital status: Widowed    Spouse name: Not on file  . Number of children: Not on file  . Years of education: Not on file  . Highest education level: Not on file  Occupational History  . Occupation: disability  Social Needs  . Financial resource strain: Not on file  . Food insecurity:    Worry: Not on file    Inability: Not on file  . Transportation needs:    Medical: Not on file    Non-medical: Not on file  Tobacco Use  . Smoking status: Current Every Day Smoker     Packs/day: 0.25    Years: 57.00    Pack years: 14.25    Types: Cigarettes    Start date: 471962  . Smokeless tobacco: Never Used  . Tobacco comment: Also VAPES on a daily basis  Substance and Sexual Activity  . Alcohol use: Never    Frequency: Never  . Drug use: Yes    Frequency: 7.0 times per week    Types: Marijuana    Comment: "maybe once in the evening"; also uses "KRATUM, the herb, to helps with pain and anxiety"  . Sexual activity: Not on file  Lifestyle  . Physical activity:    Days per week: Not on file    Minutes per session: Not on file  . Stress: Not on file  Relationships  . Social connections:    Talks on phone: Not on file    Gets together: Not on file    Attends religious service: Not on file    Active member of club or organization: Not on file    Attends meetings of clubs or organizations: Not on file    Relationship status: Not on file  Other Topics Concern  . Not on file  Social History Narrative   Lives with sister   Additional Social History:           See H&P              Sleep: Improving  Appetite:  Fair  Current Medications: Current Facility-Administered Medications  Medication Dose Route Frequency Provider Last Rate Last Dose  . acetaminophen (TYLENOL) tablet 650 mg  650 mg Oral Q6H PRN Mariel CraftMaurer,  M, MD      . albuterol (PROVENTIL) (2.5 MG/3ML) 0.083% nebulizer solution 3 mL  3 mL Inhalation Q6H PRN Mariel CraftMaurer,  M, MD      . alum & mag hydroxide-simeth (MAALOX/MYLANTA) 200-200-20 MG/5ML suspension 30 mL  30 mL Oral Q4H PRN Mariel CraftMaurer,  M, MD      . aspirin EC tablet 81 mg  81 mg Oral Daily Mariel CraftMaurer,  M, MD   81 mg at 11/16/18 [id number]  . atorvastatin (LIPITOR) tablet 40 mg  40 mg Oral QPM Mariel CraftMaurer,  M, MD   40 mg at 11/15/18 1648  . Brexpiprazole TABS 3 mg  3 mg Oral QHS Mariel CraftMaurer,  M, MD   3 mg at 11/15/18 2159  . busPIRone (BUSPAR) tablet 15 mg  15 mg Oral TID Mariel CraftMaurer,  M, MD   15 mg at 11/16/18 0817  .  diclofenac sodium (VOLTAREN) 1 % transdermal gel 2 g  2 g Topical QID PRN Mariel CraftMaurer,  M, MD      . FLUoxetine (PROZAC) capsule 60 mg  60 mg Oral Daily Mariel CraftMaurer,  M, MD   60 mg at 11/16/18 0818  . fluticasone furoate-vilanterol (BREO ELLIPTA) 100-25 MCG/INH 1 puff  1 puff Inhalation Daily Hallaji,  Mardene Speak, RPH   1 puff at 11/16/18 0819   And  . umeclidinium bromide (INCRUSE ELLIPTA) 62.5 MCG/INH 1 puff  1 puff Inhalation Daily Gardner Candle, RPH   1 puff at 11/16/18 0826  . folic acid (FOLVITE) tablet 1 mg  1 mg Oral Daily Mariel Craft, MD   1 mg at 11/16/18 1610  . isosorbide mononitrate (IMDUR) 24 hr tablet 30 mg  30 mg Oral BID Mariel Craft, MD   30 mg at 11/16/18 0820  . LORazepam (ATIVAN) tablet 1 mg  1 mg Oral Q6H PRN Mariel Craft, MD   1 mg at 11/15/18 1809  . magnesium hydroxide (MILK OF MAGNESIA) suspension 30 mL  30 mL Oral Daily PRN Mariel Craft, MD      . Melene Muller ON 11/17/2018] methotrexate (RHEUMATREX) tablet 15 mg  15 mg Oral Weekly Mariel Craft, MD      . metoprolol tartrate (LOPRESSOR) tablet 25 mg  25 mg Oral BID Mariel Craft, MD   25 mg at 11/16/18 9604  . mirtazapine (REMERON) tablet 30 mg  30 mg Oral QHS Mariel Craft, MD   30 mg at 11/15/18 2146  . nicotine (NICODERM CQ - dosed in mg/24 hours) patch 14 mg  14 mg Transdermal Daily Pucilowska, Jolanta B, MD   14 mg at 11/16/18 0826  . olopatadine (PATANOL) 0.1 % ophthalmic solution 1 drop  1 drop Both Eyes BID Mariel Craft, MD   1 drop at 11/14/18 2250  . oxyCODONE (Oxy IR/ROXICODONE) immediate release tablet 5 mg  5 mg Oral TID Mariel Craft, MD   5 mg at 11/16/18 0818  . pantoprazole (PROTONIX) EC tablet 40 mg  40 mg Oral Daily Mariel Craft, MD   40 mg at 11/16/18 0818    Lab Results:  Results for orders placed or performed during the hospital encounter of 11/14/18 (from the past 48 hour(s))  Comprehensive metabolic panel     Status: Abnormal   Collection Time: 11/14/18 11:59 AM   Result Value Ref Range   Sodium 139 135 - 145 mmol/L   Potassium 3.3 (L) 3.5 - 5.1 mmol/L   Chloride 101 98 - 111 mmol/L   CO2 32 22 - 32 mmol/L   Glucose, Bld 138 (H) 70 - 99 mg/dL   BUN 19 8 - 23 mg/dL   Creatinine, Ser 5.40 0.44 - 1.00 mg/dL   Calcium 9.7 8.9 - 98.1 mg/dL   Total Protein 7.2 6.5 - 8.1 g/dL   Albumin 3.9 3.5 - 5.0 g/dL   AST 28 15 - 41 U/L   ALT 24 0 - 44 U/L   Alkaline Phosphatase 59 38 - 126 U/L   Total Bilirubin 1.2 0.3 - 1.2 mg/dL   GFR calc non Af Amer >60 >60 mL/min   GFR calc Af Amer >60 >60 mL/min   Anion gap 6 5 - 15    Comment: Performed at [facility], [address] [place], [place] [address]  Ethanol     Status: None   Collection Time: 11/14/18 11:59 AM  Result Value Ref Range   Alcohol, Ethyl (B) <10 <10 mg/dL    Comment: (NOTE) Lowest detectable limit for serum alcohol is 10 mg/dL. For medical purposes only. Performed at [facility], [address], [place], [place] [address]   Salicylate level     Status: None   Collection Time: 11/14/18 11:59 AM  Result Value Ref  Range   Salicylate Lvl <7.0 2.8 - 30.0 mg/dL    Comment: Performed at [facility], [address] [place], [place] [address]  Acetaminophen level     Status: Abnormal   Collection Time: 11/14/18 11:59 AM  Result Value Ref Range   Acetaminophen (Tylenol), Serum <10 (L) 10 - 30 ug/mL    Comment: (NOTE) Therapeutic concentrations vary significantly. A range of 10-30 ug/mL  may be an effective concentration for many patients. However, some  are best treated at concentrations outside of this range. Acetaminophen concentrations >150 ug/mL at 4 hours after ingestion  and >50 ug/mL at 12 hours after ingestion are often associated with  toxic reactions. Performed at [facility], [address] [place], [place] [address]   cbc     Status: None   Collection Time: 11/14/18 11:59 AM  Result Value Ref Range   WBC 7.9 4.0 - 10.5 K/uL    RBC 4.49 3.87 - 5.11 MIL/uL   Hemoglobin 14.1 12.0 - 15.0 g/dL   HCT 09.842.2 11.936.0 - 14.746.0 %   MCV 94.0 80.0 - 100.0 fL   MCH 31.4 26.0 - 34.0 pg   MCHC 33.4 30.0 - 36.0 g/dL   RDW 82.913.3 56.211.5 - 13.015.5 %   Platelets 281 150 - 400 K/uL   nRBC 0.0 0.0 - 0.2 %    Comment: Performed at [facility], [address] [place], [place] [address]    Blood Alcohol level:  Lab Results  Component Value Date   [facility] <10 11/14/2018    Metabolic Disorder Labs: No results found for: HGBA1C, MPG No results found for: PROLACTIN No results found for: CHOL, TRIG, HDL, CHOLHDL, VLDL, LDLCALC  Physical Findings: AIMS:  , ,  ,  ,    CIWA:    COWS:     Musculoskeletal: Strength & Muscle Tone: within normal limits Gait & Station: normal Patient leans: N/A  Psychiatric Specialty Exam: Physical Exam  Constitutional: She is oriented to person, place, and time. She appears well-developed and well-nourished. She appears distressed.  HENT:  Head: Normocephalic and atraumatic.  Eyes: Conjunctivae and EOM are normal.  Neck: Normal range of motion.  Cardiovascular: Normal rate.  Respiratory: She is in respiratory distress.  Musculoskeletal: Normal range of motion.  Neurological: She is alert and oriented to person, place, and time.  Skin: Skin is warm and dry.    Review of Systems  HENT:       Complains of decreased taste sensation  Eyes: Negative.   Respiratory: Negative.   Gastrointestinal: Negative.   Musculoskeletal: Positive for myalgias and neck pain.  Neurological: Negative.   Psychiatric/Behavioral: Positive for depression. Negative for hallucinations, memory loss, substance abuse and suicidal ideas. The patient is nervous/anxious. The patient does not have insomnia.     Blood pressure (!) 143/78, pulse 83, temperature 99.1 F (37.3 C), temperature source Oral, resp. rate 18, height 5\' 5"  (1.651 m), weight 83.5 kg, SpO2 94 %.Body mass index is 30.63 kg/m.  General Appearance: Casual  and Neat  Eye Contact:  Good  Speech:  Clear and Coherent and Normal Rate  Volume:  Normal  Mood:  Anxious and Depressed  Affect:  Congruent  Thought Process:  Goal Directed and Descriptions of Associations: Intact  Orientation:  Full (Time, Place, and Person)  Thought Content:  Logical and Hallucinations: None  Suicidal Thoughts:  No  Homicidal Thoughts:  No  Memory:  Good  Judgement:  Good  Insight:  Good  Psychomotor Activity:  Normal  Concentration:  Concentration: Good  Recall:  Good  Fund of Knowledge:  Good  Language:  Good  Akathisia:  No  Handed:  Right  AIMS (if indicated):   0  Assets:  Communication Skills Desire for Improvement Housing Resilience Social Support  ADL's:  Intact  Cognition:  WNL  Sleep:  Number of Hours: 7.25     Treatment Plan Summary: Daily contact with patient to assess and evaluate symptoms and progress in treatment and Medication management   Continue home medications, noting medications are at maximum dosages. Provided patient further education regarding ECT.  Patient is hopeful to begin treatment on 11/18/2018.  Encourage patient to complete homework/therapy exercises.  Encouraged patient to be active in the milieu and attend group therapy.   Mariel Craft, MD 11/16/2018, 10:53 AM

## 2018-11-16 NOTE — Plan of Care (Signed)
Active in the milieu. Anxious but cooperative.

## 2018-11-17 DIAGNOSIS — F332 Major depressive disorder, recurrent severe without psychotic features: Secondary | ICD-10-CM

## 2018-11-17 DIAGNOSIS — Z79891 Long term (current) use of opiate analgesic: Secondary | ICD-10-CM

## 2018-11-17 DIAGNOSIS — F17200 Nicotine dependence, unspecified, uncomplicated: Secondary | ICD-10-CM

## 2018-11-17 DIAGNOSIS — F4310 Post-traumatic stress disorder, unspecified: Secondary | ICD-10-CM

## 2018-11-17 DIAGNOSIS — M797 Fibromyalgia: Secondary | ICD-10-CM

## 2018-11-17 MED ORDER — MENTHOL 3 MG MT LOZG
1.0000 | LOZENGE | OROMUCOSAL | Status: DC | PRN
  Administered 2018-11-17 – 2018-11-20 (×4): 3 mg via ORAL
  Filled 2018-11-17: qty 9

## 2018-11-17 MED ORDER — ZOLPIDEM TARTRATE 5 MG PO TABS: 5.0000 mg | ORAL_TABLET | Freq: Every evening | ORAL | Status: DC | PRN

## 2018-11-17 MED ORDER — LORAZEPAM 2 MG PO TABS
2.0000 mg | ORAL_TABLET | Freq: Once | ORAL | Status: AC
  Administered 2018-11-17: 2 mg via ORAL
  Filled 2018-11-17: qty 1

## 2018-11-17 NOTE — Progress Notes (Signed)
D: Pt denies SI/HI/AVH. Pt is pleasant and cooperative. Pt stated she felt the same. Pt was concerned about ECT in the morning. 1:1 time spent with writer, pt appeared to feel better after talking, pt did state she felt less anxious, pt was given 1 mg of Ativan per MAR.   A: Pt was offered support and encouragement. Pt was given scheduled medications. Pt was encourage to attend groups. Q 15 minute checks were done for safety.   R:  safety maintained on unit.   Problem: Education: Goal: Mental status will improve Outcome: Progressing   Problem: Safety: Goal: Periods of time without injury will increase Outcome: Progressing

## 2018-11-17 NOTE — Progress Notes (Signed)
Patient was active in the milieu. Attended group and maintained a positive attitude. Alert and oriented. Pleasant and cooperative. Denied thoughts of self harm. Denying hallucinations. Stayed around then went to bed. Slept all night and appeared to be comfortable in bed. Currently out of bed and sitting in the dayroom with peers. Safety monitored as ordered.

## 2018-11-17 NOTE — [facility] Group Notes (Signed)
LCSW Group Therapy Note 11/17/2018 1:15pm  Type of Therapy and Topic: Group Therapy: Feelings Around Returning Home & Establishing a Supportive Framework and Supporting Oneself When Supports Not Available  Participation Level: Active  Description of Group:  Patients first processed thoughts and feelings about upcoming discharge. These included fears of upcoming changes, lack of change, new living environments, judgements and expectations from others and overall stigma of mental health issues. The group then discussed the definition of a supportive framework, what that looks and feels like, and how do to discern it from an unhealthy non-supportive network. The group identified different types of supports as well as what to do when your family/friends are less than helpful or unavailable  Therapeutic Goals  1. Patient will identify one healthy supportive network that they can use at discharge. 2. Patient will identify one factor of a supportive framework and how to tell it from an unhealthy network. 3. Patient able to identify one coping skill to use when they do not have positive supports from others. 4. Patient will demonstrate ability to communicate their needs through discussion and/or role plays.  Summary of Patient Progress:  The patient reported she feels "anxious." Pt engaged during group session. As patients processed their anxiety about discharge and described healthy supports patient shared she is not ready to be discharge. She stated, "still working on meds and trying ECT." She listed her sister and friends as her main support.  Patients identified at least one self-care tool they were willing to use after discharge.   Therapeutic Modalities Cognitive Behavioral Therapy Motivational Interviewing   Johnnye Sima, LCSW 11/17/2018 12:39 PM

## 2018-11-17 NOTE — Progress Notes (Signed)
D- Patient alert and oriented. Patient presents in a pleasant mood on assessment stating that she didn't sleep good last night because she was having "weird dreams" and "my arm was hurting". Patient rated her pain level a "4/10", but did not request any medication other than her scheduled medications. Patient rated her depression and anxiety a "5/10" stating to this writer that "I don't really know" and "the fact that the police came to see me" is why she's feeling this way. Patient denies SI, HI, AVH, at this time. Patient's goal for today is "getting through the day".   A- Scheduled medications administered to patient, per MD orders. Support and encouragement provided.  Routine safety checks conducted every 15 minutes.  Patient informed to notify staff with problems or concerns.  R- No adverse drug reactions noted. Patient contracts for safety at this time. Patient compliant with medications and treatment plan. Patient receptive, calm, and cooperative. Patient interacts well with others on the unit.  Patient remains safe at this time.

## 2018-11-17 NOTE — Plan of Care (Signed)
Patient verbalizes understanding of the general information that's been provided to her and all questions/concerns have been addressed and answered at this time. Patient rated her depression and anxiety a "5/10" stating to this writer that "I don't really know" and "the fact that the police came to see me" is why she's feeling this way. Patient denies SI, HI, AVH, at this time. Patient verbalizes and has been in compliance with her prescribed therapeutic regimen without voicing any further questions or concerns at this time. Patient has remained free from injury and remains safe on the unit at this time.  Problem: Education: Goal: Knowledge of [place] [facility] Education information/materials will improve Outcome: Progressing Goal: Emotional status will improve Outcome: Progressing Goal: Mental status will improve Outcome: Progressing Goal: Verbalization of understanding the information provided will improve Outcome: Progressing   Problem: Safety: Goal: Periods of time without injury will increase Outcome: Progressing   Problem: Education: Goal: Utilization of techniques to improve thought processes will improve Outcome: Progressing Goal: Knowledge of the prescribed therapeutic regimen will improve Outcome: Progressing   Problem: Safety: Goal: Ability to disclose and discuss suicidal ideas will improve Outcome: Progressing Goal: Ability to identify and utilize support systems that promote safety will improve Outcome: Progressing

## 2018-11-17 NOTE — Progress Notes (Signed)
[facility] MD Progress Note  11/17/2018 12:51 PM Teresa Lang  MRN:  [id number] Subjective: "I did not sleep well last night, and I have a sore throat from all the yelling I did the other day." Principal Problem: MDD (major depressive disorder), recurrent severe, without psychosis (HCC) Diagnosis: Principal Problem:   MDD (major depressive disorder), recurrent severe, without psychosis (HCC) Active Problems:   PTSD (post-traumatic stress disorder)   Fibromyalgia   Hypertension, essential   Long term current use of opiate analgesic   Tobacco use disorder  Total Time spent with patient: 45 minutes   Ms.Noboa is a 63 year old female with a history of severe depression.  History of present illness. Information was obtained from the patient and the chart. The patient came to the ER complaining of unrelenting depression. The patient has a long history of depression that in the past few months has gotten out of control in spite of attempts to treat. The patient has been working with Dr. Georjean Mode at [facility] but remains severely depressed and completely dysfunctional. She has not been able to take care of household chores or  Take care of herself. She is insomniac and incapacitated by pain. She lost weight. She denies psychotic symptoms but endorses panic attacks, social anxiety, nightmares and flashbacks of PTSD and mild OCD symptoms. She does not use drugs or alcohol.  Past psychiatric history. Depressed since teenager. Several hospitalizations when young. One suicide attempt by overdose long time ago. Tried on all medications I could name except mood stabilizers.  On evaluation today, patient reports that she did not sleep well last night.  She reports that she gets upset that she has ongoing depression, and states "I feel like I have been living my life afraid".  She however is hopeful for ECT, and notes that "I am not afraid of that."  She is hoping to start ECT tomorrow.  Patient does express that she  is sore throat from yelling, and requests lozenges.  She has completed her therapy assignment of what happiness looks like.  She has been asked to continue the assignment to answer the question "what can I do to find happiness."  Patient endorses her depression was 10 out of 10 in severity on arrival, and despite having mood fluctuation and crying easily at times she notes that her mood is improved since being in the hospital.  Patient denies any active suicidal ideation, plan or intent.  She is denying homicidal ideation.  She is denying auditory and visual hallucinations.  Patient has been intermittently active in the milieu and attending groups today due to increased anxiety.  Past Medical History:  Past Medical History:  Diagnosis Date  . Allergy    seasonal  . Anxiety   . Arthritis   . Depression   . Hyperlipidemia   . Hypertension     Past Surgical History:  Procedure Laterality Date  . ABDOMINAL HYSTERECTOMY  1983  . CHOLECYSTECTOMY  1983  . SPINE SURGERY  beginning 1994   laminectomies, fusions   Family History:  Family History  Problem Relation Age of Onset  . Celiac disease Sister   . Breast cancer Neg Hx    Family Psychiatric  History: See H&P Social History:  Social History   Substance and Sexual Activity  Alcohol Use Never  . Frequency: Never     Social History   Substance and Sexual Activity  Drug Use Yes  . Frequency: 7.0 times per week  . Types: Marijuana   Comment: "  maybe once in the evening"; also uses "KRATUM, the herb, to helps with pain and anxiety"    Social History   Socioeconomic History  . Marital status: Widowed    Spouse name: Not on file  . Number of children: Not on file  . Years of education: Not on file  . Highest education level: Not on file  Occupational History  . Occupation: disability  Social Needs  . Financial resource strain: Not on file  . Food insecurity:    Worry: Not on file    Inability: Not on file  . Transportation  needs:    Medical: Not on file    Non-medical: Not on file  Tobacco Use  . Smoking status: Current Every Day Smoker    Packs/day: 0.25    Years: 57.00    Pack years: 14.25    Types: Cigarettes    Start date: 11962  . Smokeless tobacco: Never Used  . Tobacco comment: Also VAPES on a daily basis  Substance and Sexual Activity  . Alcohol use: Never    Frequency: Never  . Drug use: Yes    Frequency: 7.0 times per week    Types: Marijuana    Comment: "maybe once in the evening"; also uses "KRATUM, the herb, to helps with pain and anxiety"  . Sexual activity: Not on file  Lifestyle  . Physical activity:    Days per week: Not on file    Minutes per session: Not on file  . Stress: Not on file  Relationships  . Social connections:    Talks on phone: Not on file    Gets together: Not on file    Attends religious service: Not on file    Active member of club or organization: Not on file    Attends meetings of clubs or organizations: Not on file    Relationship status: Not on file  Other Topics Concern  . Not on file  Social History Narrative   Lives with sister   Additional Social History:           See H&P              Sleep: Improving  Appetite:  Fair  Current Medications: Current Facility-Administered Medications  Medication Dose Route Frequency Provider Last Rate Last Dose  . acetaminophen (TYLENOL) tablet 650 mg  650 mg Oral Q6H PRN Mariel CraftMaurer, Sheila M, MD      . albuterol (PROVENTIL) (2.5 MG/3ML) 0.083% nebulizer solution 3 mL  3 mL Inhalation Q6H PRN Mariel CraftMaurer, Sheila M, MD      . alum & mag hydroxide-simeth (MAALOX/MYLANTA) 200-200-20 MG/5ML suspension 30 mL  30 mL Oral Q4H PRN Mariel CraftMaurer, Sheila M, MD      . aspirin EC tablet 81 mg  81 mg Oral Daily Mariel CraftMaurer, Sheila M, MD   81 mg at 11/17/18 0836  . atorvastatin (LIPITOR) tablet 40 mg  40 mg Oral QPM Mariel CraftMaurer, Sheila M, MD   40 mg at 11/16/18 1702  . Brexpiprazole TABS 3 mg  3 mg Oral QHS Mariel CraftMaurer, Sheila M, MD   3 mg at  11/16/18 2133  . busPIRone (BUSPAR) tablet 15 mg  15 mg Oral TID Mariel CraftMaurer, Sheila M, MD   15 mg at 11/17/18 1224  . diclofenac sodium (VOLTAREN) 1 % transdermal gel 2 g  2 g Topical QID PRN Mariel CraftMaurer, Sheila M, MD      . FLUoxetine (PROZAC) capsule 60 mg  60 mg Oral Daily Mariel CraftMaurer, Sheila M, MD  60 mg at 11/17/18 0837  . fluticasone furoate-vilanterol (BREO ELLIPTA) 100-25 MCG/INH 1 puff  1 puff Inhalation Daily Hallaji, Mardene SpeakSheema M, RPH   1 puff at 11/17/18 0844   And  . umeclidinium bromide (INCRUSE ELLIPTA) 62.5 MCG/INH 1 puff  1 puff Inhalation Daily Gardner CandleHallaji, Sheema M, RPH   1 puff at 11/17/18 0844  . folic acid (FOLVITE) tablet 1 mg  1 mg Oral Daily Mariel CraftMaurer, Sheila M, MD   1 mg at 11/17/18 0836  . isosorbide mononitrate (IMDUR) 24 hr tablet 30 mg  30 mg Oral BID Mariel CraftMaurer, Sheila M, MD   30 mg at 11/17/18 0835  . LORazepam (ATIVAN) tablet 1 mg  1 mg Oral Q6H PRN Mariel CraftMaurer, Sheila M, MD   1 mg at 11/16/18 1630  . magnesium hydroxide (MILK OF MAGNESIA) suspension 30 mL  30 mL Oral Daily PRN Mariel CraftMaurer, Sheila M, MD      . methotrexate [facility](RHEUMATREX) tablet 15 mg  15 mg Oral Weekly Mariel CraftMaurer, Sheila M, MD   15 mg at 11/17/18 [id number]  . metoprolol tartrate (LOPRESSOR) tablet 25 mg  25 mg Oral BID Mariel CraftMaurer, Sheila M, MD   25 mg at 11/17/18 [phone]  . mirtazapine (REMERON) tablet 30 mg  30 mg Oral QHS Mariel CraftMaurer, Sheila M, MD   30 mg at 11/16/18 2133  . nicotine (NICODERM CQ - dosed in mg/24 hours) patch 14 mg  14 mg Transdermal Daily Pucilowska, Jolanta B, MD   14 mg at 11/16/18 0826  . olopatadine (PATANOL) 0.1 % ophthalmic solution 1 drop  1 drop Both Eyes BID Mariel CraftMaurer, Sheila M, MD   1 drop at 11/14/18 2250  . oxyCODONE (Oxy IR/ROXICODONE) immediate release tablet 5 mg  5 mg Oral TID Mariel CraftMaurer, Sheila M, MD   5 mg at 11/17/18 1224  . pantoprazole (PROTONIX) EC tablet 40 mg  40 mg Oral Daily Mariel CraftMaurer, Sheila M, MD   40 mg at 11/17/18 0836  . white petrolatum (VASELINE) gel   Topical PRN Mariel CraftMaurer, Sheila M, MD        Lab Results:  No  results found for this or any previous visit (from the past 48 hour(s)).  Blood Alcohol level:  Lab Results  Component Value Date   ETH <10 11/14/2018    Metabolic Disorder Labs: No results found for: HGBA1C, MPG No results found for: PROLACTIN No results found for: CHOL, TRIG, HDL, CHOLHDL, VLDL, LDLCALC  Physical Findings: AIMS:  , ,  ,  ,    CIWA:    COWS:     Musculoskeletal: Strength & Muscle Tone: within normal limits Gait & Station: normal Patient leans: N/A  Psychiatric Specialty Exam: Physical Exam  Constitutional: She is oriented to person, place, and time. She appears well-developed and well-nourished. No distress.  HENT:  Head: Normocephalic and atraumatic.  Eyes: Conjunctivae and EOM are normal.  Neck: Normal range of motion.  Cardiovascular: Normal rate.  Respiratory: Effort normal. No respiratory distress.  Musculoskeletal: Normal range of motion.  Neurological: She is alert and oriented to person, place, and time.  Skin: Skin is warm and dry.    Review of Systems  HENT: Positive for sore throat.        Complains of decreased taste sensation  Eyes: Negative.   Respiratory: Negative.   Gastrointestinal: Negative.   Musculoskeletal: Positive for myalgias and neck pain.  Neurological: Negative.   Psychiatric/Behavioral: Positive for depression. Negative for hallucinations, memory loss, substance abuse and suicidal ideas. The patient is nervous/anxious.  The patient does not have insomnia.     Blood pressure (!) 152/96, pulse 76, temperature 98.7 F (37.1 C), temperature source Oral, resp. rate 18, height 5\' 5"  (1.651 m), weight 83.5 kg, SpO2 93 %.Body mass index is 30.63 kg/m.  General Appearance: Casual and Neat  Eye Contact:  Good  Speech:  Clear and Coherent and Normal Rate  Volume:  Decreased  Mood:  Anxious and Depressed  Affect:  Congruent  Thought Process:  Goal Directed and Descriptions of Associations: Intact  Orientation:  Full (Time,  Place, and Person)  Thought Content:  Logical and Hallucinations: None  Suicidal Thoughts:  No  Homicidal Thoughts:  No  Memory:  Good  Judgement:  Good  Insight:  Good  Psychomotor Activity:  Normal  Concentration:  Concentration: Good  Recall:  Good  Fund of Knowledge:  Good  Language:  Good  Akathisia:  No  Handed:  Right  AIMS (if indicated):   0  Assets:  Communication Skills Desire for Improvement Housing Resilience Social Support  ADL's:  Intact  Cognition:  WNL  Sleep:  Number of Hours: 8.25     Treatment Plan Summary: Daily contact with patient to assess and evaluate symptoms and progress in treatment and Medication management   Continue home medications, noting medications are at maximum dosages. Provided patient further education regarding ECT.  Patient is hopeful to begin treatment on 11/18/2018.  Patient reported poor sleep last night.  Desire to trial Lunesta 1 mg, however this is not on hospital formulary.  We will provide with Ambien 5 mg once tonight, however this should not be a long-standing medication for patient with known history of addiction disorder. I have encouraged patient to have a sleep study, however she declines at this time.  Encourage patient to complete homework/therapy exercises.  Encouraged patient to be active in the milieu and attend group therapy.   Mariel Craft, MD 11/17/2018, 12:51 PM

## 2018-11-18 ENCOUNTER — Other Ambulatory Visit: Payer: Self-pay | Admitting: Psychiatry

## 2018-11-18 ENCOUNTER — Inpatient Hospital Stay: Payer: Medicare HMO

## 2018-11-18 ENCOUNTER — Inpatient Hospital Stay: Payer: Medicare HMO | Admitting: Anesthesiology

## 2018-11-18 DIAGNOSIS — F332 Major depressive disorder, recurrent severe without psychotic features: Secondary | ICD-10-CM | POA: Diagnosis not present

## 2018-11-18 LAB — GLUCOSE, CAPILLARY: Glucose-Capillary: 86 mg/dL (ref 70–99)

## 2018-11-18 MED ORDER — SODIUM CHLORIDE 0.9 % IV SOLN
500.0000 mL | Freq: Once | INTRAVENOUS | Status: AC
  Administered 2018-11-18: 500 mL via INTRAVENOUS

## 2018-11-18 MED ORDER — SUCCINYLCHOLINE CHLORIDE 20 MG/ML IJ SOLN
INTRAMUSCULAR | Status: AC
  Filled 2018-11-18: qty 1

## 2018-11-18 MED ORDER — HALOPERIDOL LACTATE 5 MG/ML IJ SOLN
INTRAMUSCULAR | Status: AC
  Filled 2018-11-18: qty 1

## 2018-11-18 MED ORDER — MIDAZOLAM HCL 2 MG/2ML IJ SOLN
INTRAMUSCULAR | Status: AC
  Filled 2018-11-18: qty 2

## 2018-11-18 MED ORDER — SUCCINYLCHOLINE CHLORIDE 200 MG/10ML IV SOSY
PREFILLED_SYRINGE | INTRAVENOUS | Status: DC | PRN
  Administered 2018-11-18: 100 mg via INTRAVENOUS

## 2018-11-18 MED ORDER — HALOPERIDOL LACTATE 5 MG/ML IJ SOLN
5.0000 mg | Freq: Once | INTRAMUSCULAR | Status: AC
  Administered 2018-11-18: 5 mg via INTRAVENOUS

## 2018-11-18 MED ORDER — METHOHEXITAL SODIUM 0.5 G IJ SOLR
INTRAMUSCULAR | Status: AC
  Filled 2018-11-18: qty 500

## 2018-11-18 MED ORDER — SODIUM CHLORIDE 0.9 % IV SOLN
INTRAVENOUS | Status: DC | PRN
  Administered 2018-11-18: 09:00:00 via INTRAVENOUS

## 2018-11-18 MED ORDER — METHOHEXITAL SODIUM 100 MG/10ML IV SOSY
PREFILLED_SYRINGE | INTRAVENOUS | Status: DC | PRN
  Administered 2018-11-18: 80 mg via INTRAVENOUS

## 2018-11-18 MED ORDER — MIDAZOLAM HCL 2 MG/2ML IJ SOLN
2.0000 mg | Freq: Once | INTRAMUSCULAR | Status: AC
  Administered 2018-11-18: 2 mg via INTRAVENOUS

## 2018-11-18 MED ORDER — MIDAZOLAM HCL 2 MG/2ML IJ SOLN: 1.0000 mg | Freq: Once | INTRAMUSCULAR | Status: DC

## 2018-11-18 NOTE — Procedures (Signed)
[facility] Physician's Interval Evaluation & Treatment Note  Patient Identification: Teresa Lang MRN:  [id number] Date of Evaluation:  11/18/2018 TX #: 1  MADRS: 38  MMSE: 30  P.E. Findings:  Nothing remarkable on the physical exam vitals unremarkable heart and lungs normal  Psychiatric Interval Note:  Depressed flat but not psychotic not actively suicidal  Subjective:  Patient is a 63 y.o. female seen for evaluation for Electroconvulsive Therapy. Down and anxious  Treatment Summary:   [x]   Right Unilateral             []  Bilateral   % Energy : 0.3 ms 70%   Impedance: 2530 ohms  Seizure Energy Index: 12,865 V squared  Postictal Suppression Index: 91%  Seizure Concordance Index: 96%  Medications  Pre Shock: Brevital 80 mg succinylcholine 100 mg  Post Shock:    Seizure Duration: 23 seconds EMG 38 seconds EEG   Comments: Follow-up Wednesday continue index course  Lungs:  [x]   Clear to auscultation               []  Other:   Heart:    [x]   Regular rhythm             []  irregular rhythm    [x]   Previous H&P reviewed, patient examined and there are NO CHANGES                 []   Previous H&P reviewed, patient examined and there are changes noted.   Mordecai Rasmussen, MD 2/10/202010:08 AM

## 2018-11-18 NOTE — Anesthesia Preprocedure Evaluation (Signed)
Anesthesia Evaluation  Patient identified by MRN, date of birth, ID band Patient awake    Reviewed: Allergy & Precautions, NPO status , Patient's Chart, lab work & pertinent test results  History of Anesthesia Complications Negative for: history of anesthetic complications  Airway Mallampati: II  TM Distance: >3 FB Neck ROM: Full    Dental  (+) Upper Dentures   Pulmonary neg sleep apnea, neg COPD, Current Smoker,    breath sounds clear to auscultation- rhonchi (-) wheezing      Cardiovascular hypertension, Pt. on medications + Past MI (takotsubo)  (-) CAD, (-) Cardiac Stents and (-) CABG  Rhythm:Regular Rate:Normal - Systolic murmurs and - Diastolic murmurs    Neuro/Psych neg Seizures PSYCHIATRIC DISORDERS Anxiety Depression negative neurological ROS     GI/Hepatic negative GI ROS, Neg liver ROS,   Endo/Other  negative endocrine ROSneg diabetes  Renal/GU negative Renal ROS     Musculoskeletal  (+) Arthritis , Fibromyalgia -  Abdominal (+) + obese,   Peds  Hematology negative hematology ROS (+)   Anesthesia Other Findings Past Medical History: No date: Allergy     Comment:  seasonal No date: Anxiety No date: Arthritis No date: Depression No date: Hyperlipidemia No date: Hypertension   Reproductive/Obstetrics                             Anesthesia Physical Anesthesia Plan  ASA: III  Anesthesia Plan: General   Post-op Pain Management:    Induction: Intravenous  PONV Risk Score and Plan: 1 and Ondansetron  Airway Management Planned: Mask  Additional Equipment:   Intra-op Plan:   Post-operative Plan:   Informed Consent: I have reviewed the patients History and Physical, chart, labs and discussed the procedure including the risks, benefits and alternatives for the proposed anesthesia with the patient or authorized representative who has indicated his/her understanding and  acceptance.     Dental advisory given  Plan Discussed with: CRNA and Anesthesiologist  Anesthesia Plan Comments:         Anesthesia Quick Evaluation

## 2018-11-18 NOTE — Anesthesia Postprocedure Evaluation (Signed)
Anesthesia Post Note  Patient: Teresa Lang  Procedure(s) Performed: ECT TX  Patient location during evaluation: PACU Anesthesia Type: General Level of consciousness: awake and alert Pain management: pain level controlled Vital Signs Assessment: post-procedure vital signs reviewed and stable Respiratory status: spontaneous breathing, nonlabored ventilation and respiratory function stable Cardiovascular status: blood pressure returned to baseline and stable Postop Assessment: no signs of nausea or vomiting Anesthetic complications: no     Last Vitals:  Vitals:   11/18/18 1043 11/18/18 1058  BP: (!) 150/88 128/83  Pulse: 89 82  Resp: 19 (!) 22  Temp: 36.8 C   SpO2: 96% 96%    Last Pain:  Vitals:   11/18/18 1109  TempSrc:   PainSc: 0-No pain                 [name]

## 2018-11-18 NOTE — Plan of Care (Signed)
Visible in the milieu, pleasant and cooperative. Compliant with treatment 

## 2018-11-18 NOTE — Progress Notes (Signed)
[facility] MD Progress Note  11/18/2018 4:45 PM Teresa Lang  MRN:  [id number] Subjective: Follow-up patient with major depression.  Patient continues to endorse depressed mood but no active suicidal intent or plan.  No active psychotic symptoms.  Patient had ECT today for the first time.  Right unilateral treatment tolerated well without complication. Principal Problem: MDD (major depressive disorder), recurrent severe, without psychosis (HCC) Diagnosis: Principal Problem:   MDD (major depressive disorder), recurrent severe, without psychosis (HCC) Active Problems:   PTSD (post-traumatic stress disorder)   Fibromyalgia   Hypertension, essential   Long term current use of opiate analgesic   Tobacco use disorder  Total Time spent with patient: 30 minutes  Past Psychiatric History: Chronic longstanding recurrent depression  Past Medical History:  Past Medical History:  Diagnosis Date  . Allergy    seasonal  . Anxiety   . Arthritis   . Depression   . Hyperlipidemia   . Hypertension     Past Surgical History:  Procedure Laterality Date  . ABDOMINAL HYSTERECTOMY  1983  . CHOLECYSTECTOMY  1983  . SPINE SURGERY  beginning 1994   laminectomies, fusions   Family History:  Family History  Problem Relation Age of Onset  . Celiac disease Sister   . Breast cancer Neg Hx    Family Psychiatric  History: See previous Social History:  Social History   Substance and Sexual Activity  Alcohol Use Never  . Frequency: Never     Social History   Substance and Sexual Activity  Drug Use Yes  . Frequency: 7.0 times per week  . Types: Marijuana   Comment: "maybe once in the evening"; also uses "KRATUM, the herb, to helps with pain and anxiety"    Social History   Socioeconomic History  . Marital status: Widowed    Spouse name: Not on file  . Number of children: Not on file  . Years of education: Not on file  . Highest education level: Not on file  Occupational History  .  Occupation: disability  Social Needs  . Financial resource strain: Not on file  . Food insecurity:    Worry: Not on file    Inability: Not on file  . Transportation needs:    Medical: Not on file    Non-medical: Not on file  Tobacco Use  . Smoking status: Current Every Day Smoker    Packs/day: 0.25    Years: 57.00    Pack years: 14.25    Types: Cigarettes    Start date: 59  . Smokeless tobacco: Never Used  . Tobacco comment: Also VAPES on a daily basis  Substance and Sexual Activity  . Alcohol use: Never    Frequency: Never  . Drug use: Yes    Frequency: 7.0 times per week    Types: Marijuana    Comment: "maybe once in the evening"; also uses "KRATUM, the herb, to helps with pain and anxiety"  . Sexual activity: Not on file  Lifestyle  . Physical activity:    Days per week: Not on file    Minutes per session: Not on file  . Stress: Not on file  Relationships  . Social connections:    Talks on phone: Not on file    Gets together: Not on file    Attends religious service: Not on file    Active member of club or organization: Not on file    Attends meetings of clubs or organizations: Not on file  Relationship status: Not on file  Other Topics Concern  . Not on file  Social History Narrative   Lives with sister   Additional Social History:                         Sleep: Fair  Appetite:  Fair  Current Medications: Current Facility-Administered Medications  Medication Dose Route Frequency Provider Last Rate Last Dose  . acetaminophen (TYLENOL) tablet 650 mg  650 mg Oral Q6H PRN Mariel Craft, MD      . albuterol (PROVENTIL) (2.5 MG/3ML) 0.083% nebulizer solution 3 mL  3 mL Inhalation Q6H PRN Mariel Craft, MD      . alum & mag hydroxide-simeth (MAALOX/MYLANTA) 200-200-20 MG/5ML suspension 30 mL  30 mL Oral Q4H PRN Mariel Craft, MD      . aspirin EC tablet 81 mg  81 mg Oral Daily Mariel Craft, MD   81 mg at 11/18/18 1423  . atorvastatin  (LIPITOR) tablet 40 mg  40 mg Oral QPM Mariel Craft, MD   40 mg at 11/17/18 1706  . Brexpiprazole TABS 3 mg  3 mg Oral QHS Mariel Craft, MD   3 mg at 11/17/18 2135  . busPIRone (BUSPAR) tablet 15 mg  15 mg Oral TID Mariel Craft, MD   15 mg at 11/18/18 1422  . diclofenac sodium (VOLTAREN) 1 % transdermal gel 2 g  2 g Topical QID PRN Mariel Craft, MD      . FLUoxetine (PROZAC) capsule 60 mg  60 mg Oral Daily Mariel Craft, MD   60 mg at 11/18/18 1423  . fluticasone furoate-vilanterol (BREO ELLIPTA) 100-25 MCG/INH 1 puff  1 puff Inhalation Daily Hallaji, Mardene Speak, RPH   1 puff at 11/18/18 0808   And  . umeclidinium bromide (INCRUSE ELLIPTA) 62.5 MCG/INH 1 puff  1 puff Inhalation Daily Gardner Candle, RPH   1 puff at 11/18/18 0809  . folic acid (FOLVITE) tablet 1 mg  1 mg Oral Daily Mariel Craft, MD   1 mg at 11/18/18 1425  . isosorbide mononitrate (IMDUR) 24 hr tablet 30 mg  30 mg Oral BID Mariel Craft, MD   30 mg at 11/17/18 1707  . magnesium hydroxide (MILK OF MAGNESIA) suspension 30 mL  30 mL Oral Daily PRN Mariel Craft, MD      . menthol-cetylpyridinium (CEPACOL) lozenge 3 mg  1 lozenge Oral PRN Mariel Craft, MD   3 mg at 11/17/18 1832  . methotrexate (RHEUMATREX) tablet 15 mg  15 mg Oral Weekly Mariel Craft, MD   15 mg at 11/17/18 0839  . metoprolol tartrate (LOPRESSOR) tablet 25 mg  25 mg Oral BID Mariel Craft, MD   25 mg at 11/18/18 5170  . midazolam (VERSED) injection 1 mg  1 mg Intravenous Once Mylene Bow T, MD      . mirtazapine (REMERON) tablet 30 mg  30 mg Oral QHS Mariel Craft, MD   30 mg at 11/17/18 2135  . nicotine (NICODERM CQ - dosed in mg/24 hours) patch 14 mg  14 mg Transdermal Daily Pucilowska, Jolanta B, MD   14 mg at 11/18/18 1426  . olopatadine (PATANOL) 0.1 % ophthalmic solution 1 drop  1 drop Both Eyes BID Mariel Craft, MD   1 drop at 11/14/18 2250  . oxyCODONE (Oxy IR/ROXICODONE) immediate release tablet 5 mg  5 mg Oral  TID  Mariel CraftMaurer, Sheila M, MD   5 mg at 11/18/18 1423  . pantoprazole (PROTONIX) EC tablet 40 mg  40 mg Oral Daily Mariel CraftMaurer, Sheila M, MD   40 mg at 11/18/18 1423  . white petrolatum (VASELINE) gel   Topical PRN Mariel CraftMaurer, Sheila M, MD      . zolpidem Remus Loffler(AMBIEN) tablet 5 mg  5 mg Oral QHS PRN Mariel CraftMaurer, Sheila M, MD        Lab Results:  Results for orders placed or performed during the hospital encounter of 11/14/18 (from the past 48 hour(s))  Glucose, capillary     Status: None   Collection Time: 11/18/18  7:03 AM  Result Value Ref Range   Glucose-Capillary 86 70 - 99 mg/dL   Comment 1 Notify RN     Blood Alcohol level:  Lab Results  Component Value Date   ETH <10 11/14/2018    Metabolic Disorder Labs: No results found for: HGBA1C, MPG No results found for: PROLACTIN No results found for: CHOL, TRIG, HDL, CHOLHDL, VLDL, LDLCALC  Physical Findings: AIMS:  , ,  ,  ,    CIWA:    COWS:     Musculoskeletal: Strength & Muscle Tone: within normal limits Gait & Station: normal Patient leans: N/A  Psychiatric Specialty Exam: Physical Exam  Nursing note and vitals reviewed. Constitutional: She appears well-developed and well-nourished.  HENT:  Head: Normocephalic and atraumatic.  Eyes: Pupils are equal, round, and reactive to light. Conjunctivae are normal.  Neck: Normal range of motion.  Cardiovascular: Regular rhythm and normal heart sounds.  Respiratory: Effort normal. No respiratory distress.  GI: Soft.  Musculoskeletal: Normal range of motion.  Neurological: She is alert.  Skin: Skin is warm and dry.  Psychiatric: Judgment normal. Teresa Lang speech is delayed. She is slowed. Thought content is not paranoid. Cognition and memory are normal. She exhibits a depressed mood. She expresses no suicidal ideation.    Review of Systems  Constitutional: Negative.   HENT: Negative.   Eyes: Negative.   Respiratory: Negative.   Cardiovascular: Negative.   Gastrointestinal: Negative.    Musculoskeletal: Negative.   Skin: Negative.   Neurological: Negative.   Psychiatric/Behavioral: Positive for depression. Negative for hallucinations, memory loss, substance abuse and suicidal ideas. The patient is nervous/anxious and has insomnia.     Blood pressure 128/83, pulse 82, temperature 98.3 F (36.8 C), resp. rate (!) 22, height 5\' 5"  (1.651 m), weight 83.9 kg, SpO2 96 %.Body mass index is 30.79 kg/m.  General Appearance: Casual  Eye Contact:  Fair  Speech:  Slow  Volume:  Decreased  Mood:  Depressed  Affect:  Congruent  Thought Process:  Coherent  Orientation:  Full (Time, Place, and Person)  Thought Content:  Logical  Suicidal Thoughts:  No  Homicidal Thoughts:  No  Memory:  Negative  Judgement:  Fair  Insight:  Fair  Psychomotor Activity:  Normal  Concentration:  Concentration: Fair  Recall:  Fair  Fund of Knowledge:  Fair  Language:  Fair  Akathisia:  No  Handed:  Right  AIMS (if indicated):     Assets:  Desire for Improvement Resilience Social Support  ADL's:  Intact  Cognition:  WNL  Sleep:  Number of Hours: 7.25     Treatment Plan Summary: Daily contact with patient to assess and evaluate symptoms and progress in treatment, Medication management and Plan ECT today and plan to continue index course of ECT.  No change to medicine for today.  Reassess tomorrow as  the patient had brought up the idea of possible discharge with follow-up outpatient ECT if it can be managed  Mordecai RasmussenJohn Yoltzin Barg, MD 11/18/2018, 4:45 PM

## 2018-11-18 NOTE — H&P (Signed)
Teresa Lang is an 63 y.o. female.   Chief Complaint: chronic depression  HPI: severe depression with recent worsening SI  Past Medical History:  Diagnosis Date  . Allergy    seasonal  . Anxiety   . Arthritis   . Depression   . Hyperlipidemia   . Hypertension     Past Surgical History:  Procedure Laterality Date  . ABDOMINAL HYSTERECTOMY  1983  . CHOLECYSTECTOMY  1983  . SPINE SURGERY  beginning 1994   laminectomies, fusions    Family History  Problem Relation Age of Onset  . Celiac disease Sister   . Breast cancer Neg Hx    Social History:  reports that she has been smoking cigarettes. She started smoking about 58 years ago. She has a 14.25 pack-year smoking history. She has never used smokeless tobacco. She reports current drug use. Frequency: 7.00 times per week. Drug: Marijuana. She reports that she does not drink alcohol.  Allergies:  Allergies  Allergen Reactions  . Prednisone Anxiety    Medications Prior to Admission  Medication Sig Dispense Refill  . albuterol (PROVENTIL HFA;VENTOLIN HFA) 108 (90 Base) MCG/ACT inhaler Inhale 2 puffs into the lungs every 6 (six) hours as needed for wheezing or shortness of breath.     [name] Kitchen. aspirin EC 81 MG tablet Take 81 mg by mouth daily.    [name] Kitchen. atorvastatin (LIPITOR) 40 MG tablet Take 40 mg by mouth daily.    . Brexpiprazole 3 MG TABS Take 3 mg by mouth at bedtime.    . busPIRone (BUSPAR) 15 MG tablet Take 15 mg by mouth 3 (three) times daily.     . diclofenac sodium (VOLTAREN) 1 % GEL Apply 2 g topically 4 (four) times daily as needed (pain).    [name] Kitchen. FLUoxetine (PROZAC) 20 MG capsule Take 60 mg by mouth daily.     . folic acid (FOLVITE) 1 MG tablet Take 1 mg by mouth daily.    [name] Kitchen. ibuprofen (ADVIL,MOTRIN) 200 MG tablet Take 200 mg by mouth every 6 (six) hours as needed for mild pain.     . isosorbide mononitrate (IMDUR) 30 MG 24 hr tablet Take 30 mg by mouth 2 (two) times daily.    [name] Kitchen. lisinopril-hydrochlorothiazide  (PRINZIDE,ZESTORETIC) 20-12.5 MG tablet Take 1 tablet by mouth 2 (two) times daily.     . methotrexate (RHEUMATREX) 2.5 MG tablet Take 15 mg by mouth once a week.     . metoprolol tartrate (LOPRESSOR) 25 MG tablet Take 25 mg by mouth 2 (two) times daily.    . mirtazapine (REMERON) 30 MG tablet Take 30 mg by mouth at bedtime.    . Olopatadine HCl 0.2 % SOLN Place 1 drop into both eyes daily.    [name] Kitchen. omeprazole (PRILOSEC) 40 MG capsule Take 40 mg by mouth daily.    [name] Kitchen. oxyCODONE (OXY IR/ROXICODONE) 5 MG immediate release tablet Take 5 mg by mouth 3 (three) times daily.    [name] Kitchen. tiZANidine (ZANAFLEX) 4 MG tablet Take 4 mg by mouth 3 (three) times daily.     . TRELEGY ELLIPTA 100-62.5-25 MCG/INH AEPB Inhale 1 puff into the lungs daily.      Results for orders placed or performed during the hospital encounter of 11/14/18 (from the past 48 hour(s))  Glucose, capillary     Status: None   Collection Time: 11/18/18  7:03 AM  Result Value Ref Range   Glucose-Capillary 86 70 - 99 mg/dL   Comment 1 Notify RN  No results found.  Review of Systems  Constitutional: Negative.   HENT: Negative.   Eyes: Negative.   Respiratory: Negative.   Cardiovascular: Negative.   Gastrointestinal: Negative.   Musculoskeletal: Negative.   Skin: Negative.   Neurological: Negative.   Psychiatric/Behavioral: Positive for depression. Negative for hallucinations, memory loss, substance abuse and suicidal ideas. The patient is nervous/anxious. The patient does not have insomnia.     Blood pressure (!) 153/96, pulse 72, temperature 98.8 F (37.1 C), temperature source Oral, resp. rate 16, height 5\' 5"  (1.651 m), weight 83.9 kg, SpO2 98 %. Physical Exam  Nursing note and vitals reviewed. Constitutional: She appears well-developed and well-nourished.  HENT:  Head: Normocephalic and atraumatic.  Eyes: Pupils are equal, round, and reactive to light. Conjunctivae are normal.  Neck: Normal range of motion.  Cardiovascular:  Normal heart sounds.  Respiratory: Effort normal.  GI: Soft.  Musculoskeletal: Normal range of motion.  Neurological: She is alert.  Skin: Skin is warm and dry.  Psychiatric: Judgment normal. Her affect is blunt. Her speech is delayed. She is slowed. Thought content is not paranoid. Cognition and memory are normal. She expresses no homicidal and no suicidal ideation.     Assessment/Plan Start index ECT RUL  Teresa Rasmussen, MD 11/18/2018, 9:51 AM

## 2018-11-18 NOTE — [facility] Group Notes (Signed)

## 2018-11-18 NOTE — Transfer of Care (Signed)
Immediate Anesthesia Transfer of Care Note  Patient: Teresa Lang  Procedure(s) Performed: ECT TX  Patient Location: PACU  Anesthesia Type:General  Level of Consciousness: sedated  Airway & Oxygen Therapy: Patient Spontanous Breathing and Patient connected to face mask oxygen  Post-op Assessment: Report given to RN and Post -op Vital signs reviewed and stable  Post vital signs: Reviewed and stable  Last Vitals:  Vitals Value Taken Time  BP    Temp    Pulse 75 11/18/2018 10:29 AM  Resp 19 11/18/2018 10:29 AM  SpO2 93 % 11/18/2018 10:29 AM  Vitals shown include unvalidated device data.  Last Pain:  Vitals:   11/18/18 0900  TempSrc:   PainSc: 0-No pain         Complications: No apparent anesthesia complications

## 2018-11-18 NOTE — Anesthesia Post-op Follow-up Note (Signed)
Anesthesia QCDR form completed.        

## 2018-11-18 NOTE — Progress Notes (Signed)
D: Escorted to and from ECT with out  Incidence . Patient stated slept good last night .Stated appetite is good and energy level  Is normal. Stated concentration is good . Stated she is  Depression scale , hopeless and anxiety  Denies suicidal  homicidal ideations  .  No auditory hallucinations  No pain concerns . Appropriate ADL'S. Interacting with peers and staff.   A: Encourage patient participation with unit programming . Instruction  Given on  Medication , verbalize understanding. visted with sister   R: Voice no other concerns. Staff continue to monitor

## 2018-11-19 ENCOUNTER — Other Ambulatory Visit: Payer: Self-pay | Admitting: Psychiatry

## 2018-11-19 NOTE — Progress Notes (Signed)
Patient stayed in the milieu, pleasant and cooperative. Alert and oriented and denying thoughts of self harm. Denying hallucinations. Gait unsteady with recent history of fall. Walker at reach. Patient was encouraged to use the walker as needed to ambulate  Safely. Received bedtime medications and went to bed. Currently sleeping and safety maintained.

## 2018-11-19 NOTE — [facility] Group Notes (Signed)
[facility] LCSW Group Therapy Note  Date/Time: 11/19/18, 1300  Type of Therapy/Topic:  Group Therapy:  Feelings about Diagnosis  Participation Level:  Minimal   Mood:quiet   Description of Group:    This group will allow patients to explore their thoughts and feelings about diagnoses they have received. Patients will be guided to explore their level of understanding and acceptance of these diagnoses. Facilitator will encourage patients to process their thoughts and feelings about the reactions of others to their diagnosis, and will guide patients in identifying ways to discuss their diagnosis with significant others in their lives. This group will be process-oriented, with patients participating in exploration of their own experiences as well as giving and receiving support and challenge from other group members.   Therapeutic Goals: 1. Patient will demonstrate understanding of diagnosis as evidence by identifying two or more symptoms of the disorder:  2. Patient will be able to express two feelings regarding the diagnosis 3. Patient will demonstrate ability to communicate their needs through discussion and/or role plays  Summary of Patient Progress:Pt attentive throughout group but did not speak.  CSW asked her questions at one point and she did respond, did say she agrees with the diagnosis she has been given.  No participation in group discussion.         Therapeutic Modalities:   Cognitive Behavioral Therapy Brief Therapy Feelings Identification   Daleen Squibb, LCSW

## 2018-11-19 NOTE — Progress Notes (Signed)
D: Patient stated slept fair last night .Stated appetite fair and energy level  . Stated concentration is good . Stated on Depression scale 4 , hopeless 4 and anxiety 5 .( low 0-10 high) Denies suicidal  homicidal ideations .  No auditory hallucinations  No pain concerns . Appropriate ADL'S. Interacting with peers and staff .  A: Encourage patient participation with unit programming . Instruction  Given on  Medication , verbalize understanding.  Verbalize  understanding of information received  from staff regarding  [place]eller . Encourage   hand  washing . Emotional and mental status improving .Voice of no safety concerns .Encourage  group attendance,  to help  with verbalization  of  feeling . Aware of all medication she is currently on . Denies  suicidal ideations .  R: Voice no other concerns. Staff continue to monitor

## 2018-11-19 NOTE — Progress Notes (Signed)
Recreation Therapy Notes  Date: 11/19/2018  Time: 9:30 am   Location: Craft room   Behavioral response: N/A   Intervention Topic: Problem Solving  Discussion/Intervention: Patient did not attend group.   Clinical Observations/Feedback:  Patient did not attend group.   Leighla Chestnutt LRT/CTRS        [place] Kievit 11/19/2018 11:36 AM

## 2018-11-19 NOTE — Progress Notes (Signed)
[facility] MD Progress Note  11/19/2018 4:40 PM Teresa Lang  MRN:  [id number] Subjective: Follow-up for this patient with major depression.  Patient seen chart reviewed.  Patient says she feels about the same.  Depressed down negative but denies any suicidal intent or plan.  Denies any psychotic symptoms.  Patient does not feel like she is aware of having any side effects from the ECT treatment.  No new physical complaints. Principal Problem: MDD (major depressive disorder), recurrent severe, without psychosis (HCC) Diagnosis: Principal Problem:   MDD (major depressive disorder), recurrent severe, without psychosis (HCC) Active Problems:   PTSD (post-traumatic stress disorder)   Fibromyalgia   Hypertension, essential   Long term current use of opiate analgesic   Tobacco use disorder  Total Time spent with patient: 30 minutes  Past Psychiatric History: Past history of recurrent depression with suicidal ideation PTSD and recent poor response to medicine  Past Medical History:  Past Medical History:  Diagnosis Date  . Allergy    seasonal  . Anxiety   . Arthritis   . Depression   . Hyperlipidemia   . Hypertension     Past Surgical History:  Procedure Laterality Date  . ABDOMINAL HYSTERECTOMY  1983  . CHOLECYSTECTOMY  1983  . SPINE SURGERY  beginning 1994   laminectomies, fusions   Family History:  Family History  Problem Relation Age of Onset  . Celiac disease Sister   . Breast cancer Neg Hx    Family Psychiatric  History: See previous Social History:  Social History   Substance and Sexual Activity  Alcohol Use Never  . Frequency: Never     Social History   Substance and Sexual Activity  Drug Use Yes  . Frequency: 7.0 times per week  . Types: Marijuana   Comment: "maybe once in the evening"; also uses "KRATUM, the herb, to helps with pain and anxiety"    Social History   Socioeconomic History  . Marital status: Widowed    Spouse name: Not on file  .  Number of children: Not on file  . Years of education: Not on file  . Highest education level: Not on file  Occupational History  . Occupation: disability  Social Needs  . Financial resource strain: Not on file  . Food insecurity:    Worry: Not on file    Inability: Not on file  . Transportation needs:    Medical: Not on file    Non-medical: Not on file  Tobacco Use  . Smoking status: Current Every Day Smoker    Packs/day: 0.25    Years: 57.00    Pack years: 14.25    Types: Cigarettes    Start date: 24  . Smokeless tobacco: Never Used  . Tobacco comment: Also VAPES on a daily basis  Substance and Sexual Activity  . Alcohol use: Never    Frequency: Never  . Drug use: Yes    Frequency: 7.0 times per week    Types: Marijuana    Comment: "maybe once in the evening"; also uses "KRATUM, the herb, to helps with pain and anxiety"  . Sexual activity: Not on file  Lifestyle  . Physical activity:    Days per week: Not on file    Minutes per session: Not on file  . Stress: Not on file  Relationships  . Social connections:    Talks on phone: Not on file    Gets together: Not on file    Attends religious service: Not  on file    Active member of club or organization: Not on file    Attends meetings of clubs or organizations: Not on file    Relationship status: Not on file  Other Topics Concern  . Not on file  Social History Narrative   Lives with sister   Additional Social History:                         Sleep: Good  Appetite:  Fair  Current Medications: Current Facility-Administered Medications  Medication Dose Route Frequency Provider Last Rate Last Dose  . acetaminophen (TYLENOL) tablet 650 mg  650 mg Oral Q6H PRN Mariel CraftMaurer, Sheila M, MD   650 mg at 11/19/18 0641  . albuterol (PROVENTIL) (2.5 MG/3ML) 0.083% nebulizer solution 3 mL  3 mL Inhalation Q6H PRN Mariel CraftMaurer, Sheila M, MD      . alum & mag hydroxide-simeth (MAALOX/MYLANTA) 200-200-20 MG/5ML suspension 30  mL  30 mL Oral Q4H PRN Mariel CraftMaurer, Sheila M, MD      . aspirin EC tablet 81 mg  81 mg Oral Daily Mariel CraftMaurer, Sheila M, MD   81 mg at 11/19/18 0820  . atorvastatin (LIPITOR) tablet 40 mg  40 mg Oral QPM Mariel CraftMaurer, Sheila M, MD   40 mg at 11/18/18 1724  . Brexpiprazole TABS 3 mg  3 mg Oral QHS Mariel CraftMaurer, Sheila M, MD   3 mg at 11/18/18 2142  . busPIRone (BUSPAR) tablet 15 mg  15 mg Oral TID Mariel CraftMaurer, Sheila M, MD   15 mg at 11/19/18 1215  . diclofenac sodium (VOLTAREN) 1 % transdermal gel 2 g  2 g Topical QID PRN Mariel CraftMaurer, Sheila M, MD      . FLUoxetine (PROZAC) capsule 60 mg  60 mg Oral Daily Mariel CraftMaurer, Sheila M, MD   60 mg at 11/19/18 0820  . fluticasone furoate-vilanterol (BREO ELLIPTA) 100-25 MCG/INH 1 puff  1 puff Inhalation Daily Hallaji, Mardene SpeakSheema M, RPH   1 puff at 11/19/18 [id number]   And  . umeclidinium bromide (INCRUSE ELLIPTA) 62.5 MCG/INH 1 puff  1 puff Inhalation Daily Gardner CandleHallaji, Sheema M, RPH   1 puff at 11/19/18 0820  . folic acid (FOLVITE) tablet 1 mg  1 mg Oral Daily Mariel CraftMaurer, Sheila M, MD   1 mg at 11/19/18 [id number]  . isosorbide mononitrate (IMDUR) 24 hr tablet 30 mg  30 mg Oral BID Mariel CraftMaurer, Sheila M, MD   30 mg at 11/19/18 [id number]  . magnesium hydroxide (MILK OF MAGNESIA) suspension 30 mL  30 mL Oral Daily PRN Mariel CraftMaurer, Sheila M, MD      . menthol-cetylpyridinium (CEPACOL) lozenge 3 mg  1 lozenge Oral PRN Mariel CraftMaurer, Sheila M, MD   3 mg at 11/19/18 1408  . methotrexate (RHEUMATREX) tablet 15 mg  15 mg Oral Weekly Mariel CraftMaurer, Sheila M, MD   15 mg at 11/17/18 0839  . metoprolol tartrate (LOPRESSOR) tablet 25 mg  25 mg Oral BID Mariel CraftMaurer, Sheila M, MD   25 mg at 11/19/18 [id number]  . midazolam (VERSED) injection 1 mg  1 mg Intravenous Once Cristie Mckinney T, MD      . mirtazapine (REMERON) tablet 30 mg  30 mg Oral QHS Mariel CraftMaurer, Sheila M, MD   30 mg at 11/18/18 2142  . nicotine (NICODERM CQ - dosed in mg/24 hours) patch 14 mg  14 mg Transdermal Daily Pucilowska, Jolanta B, MD   14 mg at 11/19/18 0831  . olopatadine (PATANOL) 0.1 % ophthalmic  solution 1 drop  1 drop Both Eyes BID Mariel Craft, MD   1 drop at 11/18/18 2878  . oxyCODONE (Oxy IR/ROXICODONE) immediate release tablet 5 mg  5 mg Oral TID Mariel Craft, MD   5 mg at 11/19/18 1215  . pantoprazole (PROTONIX) EC tablet 40 mg  40 mg Oral Daily Mariel Craft, MD   40 mg at 11/19/18 0819  . white petrolatum (VASELINE) gel   Topical PRN Mariel Craft, MD      . zolpidem Remus Loffler) tablet 5 mg  5 mg Oral QHS PRN Mariel Craft, MD        Lab Results:  Results for orders placed or performed during the hospital encounter of 11/14/18 (from the past 48 hour(s))  Glucose, capillary     Status: None   Collection Time: 11/18/18  7:03 AM  Result Value Ref Range   Glucose-Capillary 86 70 - 99 mg/dL   Comment 1 Notify RN     Blood Alcohol level:  Lab Results  Component Value Date   ETH <10 11/14/2018    Metabolic Disorder Labs: No results found for: HGBA1C, MPG No results found for: PROLACTIN No results found for: CHOL, TRIG, HDL, CHOLHDL, VLDL, LDLCALC  Physical Findings: AIMS:  , ,  ,  ,    CIWA:    COWS:     Musculoskeletal: Strength & Muscle Tone: within normal limits Gait & Station: normal Patient leans: N/A  Psychiatric Specialty Exam: Physical Exam  Nursing note and vitals reviewed. Constitutional: She appears well-developed and well-nourished.  HENT:  Head: Normocephalic and atraumatic.  Eyes: Pupils are equal, round, and reactive to light. Conjunctivae are normal.  Neck: Normal range of motion.  Cardiovascular: Regular rhythm and normal heart sounds.  Respiratory: Effort normal. No respiratory distress.  GI: Soft.  Musculoskeletal: Normal range of motion.  Neurological: She is alert.  Skin: Skin is warm and dry.  Psychiatric: Judgment normal. Her affect is blunt. Her speech is delayed. She is slowed. She expresses no suicidal ideation. She exhibits abnormal recent memory.    Review of Systems  Constitutional: Negative.   HENT: Negative.    Eyes: Negative.   Respiratory: Negative.   Cardiovascular: Negative.   Gastrointestinal: Negative.   Musculoskeletal: Negative.   Skin: Negative.   Neurological: Negative.   Psychiatric/Behavioral: Positive for depression. Negative for hallucinations, memory loss, substance abuse and suicidal ideas. The patient is not nervous/anxious and does not have insomnia.     Blood pressure (!) 154/89, pulse 79, temperature 99.2 F (37.3 C), temperature source Oral, resp. rate 18, height 5\' 5"  (1.651 m), weight 83.9 kg, SpO2 95 %.Body mass index is 30.79 kg/m.  General Appearance: Casual  Eye Contact:  Good  Speech:  Slow  Volume:  Decreased  Mood:  Depressed and Dysphoric  Affect:  Constricted  Thought Process:  Coherent  Orientation:  Full (Time, Place, and Person)  Thought Content:  Logical  Suicidal Thoughts:  No  Homicidal Thoughts:  No  Memory:  Immediate;   Fair Recent;   Fair Remote;   Fair  Judgement:  Fair  Insight:  Fair  Psychomotor Activity:  Decreased  Concentration:  Concentration: Fair  Recall:  [employer] of Knowledge:  Fair  Language:  Fair  Akathisia:  No  Handed:  Right  AIMS (if indicated):     Assets:  Desire for Improvement Housing Resilience  ADL's:  Intact  Cognition:  WNL  Sleep:  Number of Hours: 7  Treatment Plan Summary: Daily contact with patient to assess and evaluate symptoms and progress in treatment, Medication management and Plan Patient still looks very down and depressed.  She is not suicidal and could be a candidate for outpatient ECT but is still on functional enough that is reasonable to have her stay in the hospital at least through treatment tomorrow.  Next ECT treatment scheduled for tomorrow morning no other changes in medication for now.  Mordecai Rasmussen, MD 11/19/2018, 4:40 PM

## 2018-11-20 ENCOUNTER — Inpatient Hospital Stay: Payer: Medicare HMO | Admitting: Anesthesiology

## 2018-11-20 ENCOUNTER — Telehealth: Payer: Self-pay

## 2018-11-20 ENCOUNTER — Encounter: Payer: Self-pay | Admitting: Anesthesiology

## 2018-11-20 LAB — GLUCOSE, CAPILLARY: Glucose-Capillary: 82 mg/dL (ref 70–99)

## 2018-11-20 MED ORDER — ISOSORBIDE MONONITRATE ER 30 MG PO TB24: 30.0000 mg | ORAL_TABLET | Freq: Two times a day (BID) | ORAL | 1 refills | Status: DC

## 2018-11-20 MED ORDER — MIRTAZAPINE 30 MG PO TABS: 30.0000 mg | ORAL_TABLET | Freq: Every day | ORAL | 1 refills | Status: DC

## 2018-11-20 MED ORDER — BUSPIRONE HCL 15 MG PO TABS: 15.0000 mg | ORAL_TABLET | Freq: Three times a day (TID) | ORAL | 1 refills | Status: DC

## 2018-11-20 MED ORDER — METOPROLOL TARTRATE 25 MG PO TABS: 25.0000 mg | ORAL_TABLET | Freq: Two times a day (BID) | ORAL | 1 refills | Status: DC

## 2018-11-20 MED ORDER — FLUTICASONE FUROATE-VILANTEROL 100-25 MCG/INH IN AEPB: 1.0000 | INHALATION_SPRAY | Freq: Every day | RESPIRATORY_TRACT | 1 refills | Status: DC

## 2018-11-20 MED ORDER — PANTOPRAZOLE SODIUM 40 MG PO TBEC: 40.0000 mg | DELAYED_RELEASE_TABLET | Freq: Every day | ORAL | 1 refills | Status: DC

## 2018-11-20 MED ORDER — ALBUTEROL SULFATE HFA 108 (90 BASE) MCG/ACT IN AERS: 2.0000 | INHALATION_SPRAY | Freq: Four times a day (QID) | RESPIRATORY_TRACT | 1 refills | Status: DC | PRN

## 2018-11-20 MED ORDER — BREXPIPRAZOLE 3 MG PO TABS: 3.0000 mg | ORAL_TABLET | Freq: Every day | ORAL | 1 refills | Status: DC

## 2018-11-20 MED ORDER — UMECLIDINIUM BROMIDE 62.5 MCG/INH IN AEPB: 1.0000 | INHALATION_SPRAY | Freq: Every day | RESPIRATORY_TRACT | 1 refills | Status: DC

## 2018-11-20 MED ORDER — FLUOXETINE HCL 20 MG PO CAPS: 60.0000 mg | ORAL_CAPSULE | Freq: Every day | ORAL | 1 refills | Status: DC

## 2018-11-20 MED ORDER — ATORVASTATIN CALCIUM 40 MG PO TABS: 40.0000 mg | ORAL_TABLET | Freq: Every evening | ORAL | 1 refills | Status: DC

## 2018-11-20 MED ORDER — OLOPATADINE HCL 0.1 % OP SOLN: 1.0000 [drp] | Freq: Two times a day (BID) | OPHTHALMIC | 1 refills | Status: DC

## 2018-11-20 NOTE — Progress Notes (Signed)
D: Patient is aware of  Discharge this shift .Patient denies suicidal /homicidal ideations. Patient received all belongings brought in  A: No Storage medications. Writer reviewed Discharge Summary, Suicide Risk Assessment, and Transitional Record. Patient also received Prescriptions   from  MD.  . Aware  Of follow up appointment ECT on Friday. R: Patient left unit with no questions  Or concerns  With sister

## 2018-11-20 NOTE — Discharge Instructions (Signed)
1)  The drugs that you have been given will stay in your system until tomorrow so for the       next 24 hours you should not:  A. Drive an automobile  B. Make any legal decisions  C. Drink any alcoholic beverages  2)  You may resume your regular meals upon return home.  3)  A responsible adult must take you home.  Someone should stay with you for a few          hours, then be available by phone for the remainder of the treatment day.  4)  You May experience any of the following symptoms:  Headache, Nausea and a dry mouth (due to the medications you were given),  temporary memory loss and some confusion, or sore muscles (a warm bath  should help this).  If you you experience any of these symptoms let us know on                your return visit.  5)  Report any of the following: any acute discomfort, severe headache, or temperature        greater than 100.5 F.   Also report any unusual redness, swelling, drainage, or pain         at your IV site.    You may report Symptoms to:  ECT PROGRAM- [place] [facility]          Phone: [phone], ECT Department           or Dr. Shary Key office [phone]  6)  Your next ECT Treatment is Friday , February 14 at 9:00  We will call 2 days prior to your scheduled appointment for arrival times.  7)  Nothing to eat or drink after midnight the night before your procedure.  8)  Take METOPROLOL    With a sip of water the morning of your procedure.  9)  Other Instructions: Call [phone] to cancel the morning of your procedure due         to illness or emergency.  10) We will call within 72 hours to assess how you are feeling.

## 2018-11-20 NOTE — Progress Notes (Signed)
Recreation Therapy Notes  Date: 11/20/2018  Time: 9:30 am   Location: Craft room   Behavioral response: N/A   Intervention Topic: Communication  Discussion/Intervention: Patient did not attend group.   Clinical Observations/Feedback:  Patient did not attend group.   Makyia Erxleben LRT/CTRS        Genice Kimberlin 11/20/2018 11:25 AM 

## 2018-11-20 NOTE — Progress Notes (Signed)
  [facility] Adult Case Management Discharge Plan :  Will you be returning to the same living situation after discharge:  Yes,  pt reports that she will return to the home of her sister. At discharge, do you have transportation home?: Yes,  pt reports that her sister will provide transportation. Do you have the ability to pay for your medications: Yes,  [employer] Medicare  Release of information consent forms completed and in the chart;  Patient's signature needed at discharge.  Patient to Follow up at: Follow-up Information    [facility] Follow up.   Why:  Hospital follow up appointment is scheduled for 11/22/18 at 12:30. Thank you! Contact information: [address] [place] [address] [phone]        [facility] Follow up.   Why:  This office will call to schedule follow up appointments with you. Contact information: [address] [id number] [place] [address] [phone]          Next level of care provider has access to [facility] Link:no  Safety Planning and Suicide Prevention discussed: Yes,  SPE completed with the pt's sister.  Have you used any form of tobacco in the last 30 days? (Cigarettes, Smokeless Tobacco, Cigars, and/or Pipes): Yes  Has patient been referred to the Quitline?: Patient refused referral  Patient has been referred for addiction treatment: N/A  [name], LCSW 11/20/2018, 10:12 AM

## 2018-11-20 NOTE — Discharge Summary (Signed)
Physician Discharge Summary Note  Patient:  Teresa Lang is an 63 y.o., female MRN:  [id number] DOB:  [birth year] Patient phone:  [phone] (home)  Patient address:   [address] [place] [id number],  Total Time spent with patient: 45 minutes  Date of Admission:  11/14/2018 Date of Discharge: November 20, 2018  Reason for Admission: Admitted to the hospital because of depression with worsening suicidal ideation  Principal Problem: MDD (major depressive disorder), recurrent severe, without psychosis (HCC) Discharge Diagnoses: Principal Problem:   MDD (major depressive disorder), recurrent severe, without psychosis (HCC) Active Problems:   PTSD (post-traumatic stress disorder)   Fibromyalgia   Hypertension, essential   Long term current use of opiate analgesic   Tobacco use disorder   Past Psychiatric History: Past history of recurrent severe chronic depression  Past Medical History:  Past Medical History:  Diagnosis Date  . Allergy    seasonal  . Anxiety   . Arthritis   . Depression   . Hyperlipidemia   . Hypertension     Past Surgical History:  Procedure Laterality Date  . ABDOMINAL HYSTERECTOMY  1983  . CHOLECYSTECTOMY  1983  . SPINE SURGERY  beginning 1994   laminectomies, fusions   Family History:  Family History  Problem Relation Age of Onset  . Celiac disease Sister   . Breast cancer Neg Hx    Family Psychiatric  History: None Social History:  Social History   Substance and Sexual Activity  Alcohol Use Never  . Frequency: Never     Social History   Substance and Sexual Activity  Drug Use Yes  . Frequency: 7.0 times per week  . Types: Marijuana   Comment: "maybe once in the evening"; also uses "KRATUM, the herb, to helps with pain and anxiety"    Social History   Socioeconomic History  . Marital status: Widowed    Spouse name: Not on file  . Number of children: Not on file  . Years of education: Not on file  . Highest  education level: Not on file  Occupational History  . Occupation: disability  Social Needs  . Financial resource strain: Not on file  . Food insecurity:    Worry: Not on file    Inability: Not on file  . Transportation needs:    Medical: Not on file    Non-medical: Not on file  Tobacco Use  . Smoking status: Current Every Day Smoker    Packs/day: 0.25    Years: 57.00    Pack years: 14.25    Types: Cigarettes    Start date: 171962  . Smokeless tobacco: Never Used  . Tobacco comment: Also VAPES on a daily basis  Substance and Sexual Activity  . Alcohol use: Never    Frequency: Never  . Drug use: Yes    Frequency: 7.0 times per week    Types: Marijuana    Comment: "maybe once in the evening"; also uses "KRATUM, the herb, to helps with pain and anxiety"  . Sexual activity: Not on file  Lifestyle  . Physical activity:    Days per week: Not on file    Minutes per session: Not on file  . Stress: Not on file  Relationships  . Social connections:    Talks on phone: Not on file    Gets together: Not on file    Attends religious service: Not on file    Active member of club or organization: Not on file  Attends meetings of clubs or organizations: Not on file    Relationship status: Not on file  Other Topics Concern  . Not on file  Social History Narrative   Lives with sister    Hospital Course: Patient was admitted to the hospital and treated for depression.  Continue outpatient antidepressant medicine.  Also continue medicine for her outpatient treatment of high blood pressure history of coronary artery disease and asthma.  Patient appeared to be a potentially good candidate for ECT.  ECT consult was done and the patient was agreeable to a trial of the treatment.  We started on Monday and she had right unilateral treatment tolerated without any complication.  Wednesday she was scheduled for treatment but ate breakfast in the morning.  Because of this we could not do treatment.  I  spoke with her and she is still wanting to try to pursue the treatment.  Says the eating breakfast was an accident.  We will go ahead and keep her on the schedule for Friday but as an outpatient and continue going forward with outpatient treatment.  Patient understands she needs to have transportation for this.  Prescriptions done for medications.  Arrangements for outpatient treatment with regular psychiatrist as well as ECT.  Physical Findings: AIMS:  , ,  ,  ,    CIWA:    COWS:     Musculoskeletal: Strength & Muscle Tone: within normal limits Gait & Station: normal Patient leans: N/A  Psychiatric Specialty Exam: Physical Exam  Nursing note and vitals reviewed. Constitutional: She appears well-developed and well-nourished.  HENT:  Head: Normocephalic and atraumatic.  Eyes: Pupils are equal, round, and reactive to light. Conjunctivae are normal.  Neck: Normal range of motion.  Cardiovascular: Regular rhythm and normal heart sounds.  Respiratory: Effort normal. No respiratory distress.  GI: Soft.  Musculoskeletal: Normal range of motion.  Neurological: She is alert.  Skin: Skin is warm and dry.  Psychiatric: Judgment normal. Her affect is blunt. Her speech is delayed. She is slowed. Thought content is not paranoid. Cognition and memory are normal. She expresses no homicidal and no suicidal ideation.    Review of Systems  Constitutional: Negative.   HENT: Negative.   Eyes: Negative.   Respiratory: Negative.   Cardiovascular: Negative.   Gastrointestinal: Negative.   Musculoskeletal: Positive for back pain and joint pain.  Skin: Negative.   Neurological: Negative.   Psychiatric/Behavioral: Positive for depression. Negative for hallucinations, substance abuse and suicidal ideas.    Blood pressure (!) 135/96, pulse 76, temperature 98.6 F (37 C), temperature source Oral, resp. rate 18, height  (1.651 m), weight 83.9 kg, SpO2 95 %.Body mass index is 30.79 kg/m.  General  Appearance: Casual  Eye Contact:  Good  Speech:  Clear and Coherent  Volume:  Normal  Mood:  Depressed  Affect:  Congruent  Thought Process:  Goal Directed  Orientation:  Full (Time, Place, and Person)  Thought Content:  Logical  Suicidal Thoughts:  No  Homicidal Thoughts:  No  Memory:  Immediate;   Fair Recent;   Fair Remote;   Fair  Judgement:  Fair  Insight:  Fair  Psychomotor Activity:  Decreased  Concentration:  Concentration: Fair  Recall:  [employer] of Knowledge:  Fair  Language:  Fair  Akathisia:  No  Handed:  Right  AIMS (if indicated):     Assets:  Desire for Improvement Housing Physical Health Social Support  ADL's:  Intact  Cognition:  WNL  Sleep:  Number of Hours: 7     Have you used any form of tobacco in the last 30 days? (Cigarettes, Smokeless Tobacco, Cigars, and/or Pipes): Yes  Has this patient used any form of tobacco in the last 30 days? (Cigarettes, Smokeless Tobacco, Cigars, and/or Pipes) Yes, No  Blood Alcohol level:  Lab Results  Component Value Date   ETH <10 11/14/2018    Metabolic Disorder Labs:  No results found for: HGBA1C, MPG No results found for: PROLACTIN No results found for: CHOL, TRIG, HDL, CHOLHDL, VLDL, LDLCALC  See Psychiatric Specialty Exam and Suicide Risk Assessment completed by Attending Physician prior to discharge.  Discharge destination:  Home  Is patient on multiple antipsychotic therapies at discharge:  No   Has Patient had three or more failed trials of antipsychotic monotherapy by history:  No  Recommended Plan for Multiple Antipsychotic Therapies: NA  Discharge Instructions    Diet - low sodium heart healthy   Complete by:  As directed    Increase activity slowly   Complete by:  As directed      Allergies as of 11/20/2018      Reactions   Prednisone Anxiety      Medication List    STOP taking these medications   lisinopril-hydrochlorothiazide 20-12.5 MG tablet Commonly known as:   PRINZIDE,ZESTORETIC   Olopatadine HCl 0.2 % Soln Replaced by:  olopatadine 0.1 % ophthalmic solution   omeprazole 40 MG capsule Commonly known as:  PRILOSEC Replaced by:  pantoprazole 40 MG tablet   TRELEGY ELLIPTA 100-62.5-25 MCG/INH Aepb Generic drug:  Fluticasone-Umeclidin-Vilant     TAKE these medications     Indication  albuterol 108 (90 Base) MCG/ACT inhaler Commonly known as:  PROVENTIL HFA;VENTOLIN HFA Inhale 2 puffs into the lungs every 6 (six) hours as needed for wheezing or shortness of breath.  Indication:  Asthma   aspirin EC 81 MG tablet Take 81 mg by mouth daily.  Indication:  Stable Angina Pectoris   atorvastatin 40 MG tablet Commonly known as:  LIPITOR Take 1 tablet (40 mg total) by mouth every evening. What changed:  when to take this  Indication:  High Amount of Fats in the Blood   Brexpiprazole 3 MG Tabs Take 3 mg by mouth at bedtime.  Indication:  Major Depressive Disorder   busPIRone 15 MG tablet Commonly known as:  BUSPAR Take 1 tablet (15 mg total) by mouth 3 (three) times daily.  Indication:  Major Depressive Disorder   diclofenac sodium 1 % Gel Commonly known as:  VOLTAREN Apply 2 g topically 4 (four) times daily as needed (pain).  Indication:  Joint Damage causing Pain and Loss of Function   FLUoxetine 20 MG capsule Commonly known as:  PROZAC Take 3 capsules (60 mg total) by mouth daily.  Indication:  Major Depressive Disorder   fluticasone furoate-vilanterol 100-25 MCG/INH Aepb Commonly known as:  BREO ELLIPTA Inhale 1 puff into the lungs daily. Start taking on:  November 21, 2018  Indication:  Asthma   folic acid 1 MG tablet Commonly known as:  FOLVITE Take 1 mg by mouth daily.  Indication:  Methotrexate Poisoning   ibuprofen 200 MG tablet Commonly known as:  ADVIL,MOTRIN Take 200 mg by mouth every 6 (six) hours as needed for mild pain.  Indication:  Mild to Moderate Pain   isosorbide mononitrate 30 MG 24 hr  tablet Commonly known as:  IMDUR Take 1 tablet (30 mg total) by mouth 2 (two) times daily.  Indication:  Stable Angina Pectoris   methotrexate 2.5 MG tablet Commonly known as:  RHEUMATREX Take 15 mg by mouth once a week.  Indication:  Rheumatoid Arthritis   metoprolol tartrate 25 MG tablet Commonly known as:  LOPRESSOR Take 1 tablet (25 mg total) by mouth 2 (two) times daily.  Indication:  High Blood Pressure Disorder   mirtazapine 30 MG tablet Commonly known as:  REMERON Take 1 tablet (30 mg total) by mouth at bedtime.  Indication:  Major Depressive Disorder   olopatadine 0.1 % ophthalmic solution Commonly known as:  PATANOL Place 1 drop into both eyes 2 (two) times daily. Replaces:  Olopatadine HCl 0.2 % Soln  Indication:  Allergic Conjunctivitis   oxyCODONE 5 MG immediate release tablet Commonly known as:  Oxy IR/ROXICODONE Take 5 mg by mouth 3 (three) times daily.  Indication:  Chronic Pain   pantoprazole 40 MG tablet Commonly known as:  PROTONIX Take 1 tablet (40 mg total) by mouth daily. Start taking on:  November 21, 2018 Replaces:  omeprazole 40 MG capsule  Indication:  Gastroesophageal Reflux Disease   tiZANidine 4 MG tablet Commonly known as:  ZANAFLEX Take 4 mg by mouth 3 (three) times daily.  Indication:  Lower Backache   umeclidinium bromide 62.5 MCG/INH Aepb Commonly known as:  INCRUSE ELLIPTA Inhale 1 puff into the lungs daily. Start taking on:  November 21, 2018  Indication:  Chronic Obstructive Lung Disease      Follow-up Information    [facility] Follow up.   Why:  Hospital follow up appointment is scheduled for 11/22/18 at 12:30. Thank you! Contact information: [address] [place] [address] [phone]        [facility] Follow up.   Why:  This office will call to schedule follow up appointments with you. Contact information: [address] [id number] [place]  [address] [phone]          Follow-up recommendations:  Activity:  Activity as tolerated Diet:  Heart healthy diet Other:  Follow-up outpatient treatment including ECT  Comments: Psychoeducation and review of treatment plan completed.  Patient at this point denies suicidal ideation and does not appear to be hopeless or at immediate risk to self.  No sign of psychosis.  Appears safe for discharge home.  Signed: Mordecai Rasmussen, MD 11/20/2018, 11:28 AM

## 2018-11-20 NOTE — [facility] Suicide Risk Assessment (Signed)
[facility] Discharge Suicide Risk Assessment   Principal Problem: MDD (major depressive disorder), recurrent severe, without psychosis (HCC) Discharge Diagnoses: Principal Problem:   MDD (major depressive disorder), recurrent severe, without psychosis (HCC) Active Problems:   PTSD (post-traumatic stress disorder)   Fibromyalgia   Hypertension, essential   Long term current use of opiate analgesic   Tobacco use disorder   Total Time spent with patient: 45 minutes  Musculoskeletal: Strength & Muscle Tone: within normal limits Gait & Station: normal Patient leans: N/A  Psychiatric Specialty Exam: Review of Systems  Constitutional: Negative.   HENT: Negative.   Eyes: Negative.   Respiratory: Negative.   Cardiovascular: Negative.   Gastrointestinal: Negative.   Musculoskeletal: Negative.   Skin: Negative.   Neurological: Negative.   Psychiatric/Behavioral: Positive for depression. Negative for hallucinations, memory loss, substance abuse and suicidal ideas. The patient is not nervous/anxious and does not have insomnia.     Blood pressure (!) 135/96, pulse 76, temperature 98.6 F (37 C), temperature source Oral, resp. rate 18, height 5\' 5"  (1.651 m), weight 83.9 kg, SpO2 95 %.Body mass index is 30.79 kg/m.  General Appearance: Casual  Eye Contact::  Good  Speech:  Clear and Coherent409  Volume:  Normal  Mood:  Euthymic  Affect:  Congruent  Thought Process:  Goal Directed  Orientation:  Full (Time, Place, and Person)  Thought Content:  Logical  Suicidal Thoughts:  No  Homicidal Thoughts:  No  Memory:  Immediate;   Fair Recent;   Fair Remote;   Fair  Judgement:  Fair  Insight:  Fair  Psychomotor Activity:  Decreased  Concentration:  Fair  Recall:  [employer] of Knowledge:Fair  Language: Fair  Akathisia:  No  Handed:  Right  AIMS (if indicated):     Assets:  Desire for Improvement Housing Physical Health Resilience  Sleep:  Number of Hours: 7  Cognition: WNL  ADL's:   Intact   Mental Status Per Nursing Assessment::   On Admission:  NA  Demographic Factors:  Unemployed  Loss Factors: Decline in physical health  Historical Factors: Impulsivity  Risk Reduction Factors:   Sense of responsibility to family, Religious beliefs about death, Living with another person, especially a relative, Positive social support and Positive therapeutic relationship  Continued Clinical Symptoms:  Depression:   Anhedonia  Cognitive Features That Contribute To Risk:  Polarized thinking    Suicide Risk:  Minimal: No identifiable suicidal ideation.  Patients presenting with no risk factors but with morbid ruminations; may be classified as minimal risk based on the severity of the depressive symptoms  Follow-up Information    [facility] Follow up.   Why:  Hospital follow up appointment is scheduled for 11/22/18 at 12:30. Thank you! Contact information: [address] [place] [address] [phone]        [facility] Follow up.   Why:  This office will call to schedule follow up appointments with you. Contact information: [address] [id number] [place] [address] [phone]          Plan Of Care/Follow-up recommendations:  Activity:  Activity as tolerated Diet:  Regular diet Other:  Follow-up outpatient psychiatric care including ECT treatment  Mordecai Rasmussen, MD 11/20/2018, 11:18 AM

## 2018-11-20 NOTE — Anesthesia Preprocedure Evaluation (Deleted)
Anesthesia Evaluation  Patient identified by MRN, date of birth, ID band Patient awake    Reviewed: Allergy & Precautions, NPO status , Patient's Chart, lab work & pertinent test results  History of Anesthesia Complications Negative for: history of anesthetic complications  Airway Mallampati: II  TM Distance: >3 FB Neck ROM: Full    Dental  (+) Upper Dentures   Pulmonary neg sleep apnea, neg COPD, Current Smoker,    breath sounds clear to auscultation- rhonchi (-) wheezing      Cardiovascular hypertension, Pt. on medications + Past MI (takotsubo)  (-) CAD, (-) Cardiac Stents and (-) CABG  Rhythm:Regular Rate:Normal - Systolic murmurs and - Diastolic murmurs    Neuro/Psych neg Seizures PSYCHIATRIC DISORDERS Anxiety Depression negative neurological ROS     GI/Hepatic negative GI ROS, Neg liver ROS,   Endo/Other  negative endocrine ROSneg diabetes  Renal/GU negative Renal ROS     Musculoskeletal  (+) Arthritis , Fibromyalgia -  Abdominal (+) + obese,   Peds  Hematology negative hematology ROS (+)   Anesthesia Other Findings Past Medical History: No date: Allergy     Comment:  seasonal No date: Anxiety No date: Arthritis No date: Depression No date: Hyperlipidemia No date: Hypertension   Reproductive/Obstetrics                             Anesthesia Physical  Anesthesia Plan  ASA: III  Anesthesia Plan: General   Post-op Pain Management:    Induction: Intravenous  PONV Risk Score and Plan: 1 and Ondansetron  Airway Management Planned: Mask  Additional Equipment:   Intra-op Plan:   Post-operative Plan:   Informed Consent: I have reviewed the patients History and Physical, chart, labs and discussed the procedure including the risks, benefits and alternatives for the proposed anesthesia with the patient or authorized representative who has indicated his/her understanding and  acceptance.     Dental advisory given  Plan Discussed with: CRNA and Anesthesiologist  Anesthesia Plan Comments:         Anesthesia Quick Evaluation  

## 2018-11-20 NOTE — Tx Team (Signed)
Interdisciplinary Treatment and Diagnostic Plan Update  11/20/2018 Time of Session: 8:30AM Teresa Lang MRN: [id number]  Principal Diagnosis: MDD (major depressive disorder), recurrent severe, without psychosis (HCC)  Secondary Diagnoses: Principal Problem:   MDD (major depressive disorder), recurrent severe, without psychosis (HCC) Active Problems:   PTSD (post-traumatic stress disorder)   Fibromyalgia   Hypertension, essential   Long term current use of opiate analgesic   Tobacco use disorder   Current Medications:  Current Facility-Administered Medications  Medication Dose Route Frequency Provider Last Rate Last Dose  . acetaminophen (TYLENOL) tablet 650 mg  650 mg Oral Q6H PRN Mariel Craft, MD   650 mg at 11/19/18 0641  . albuterol (PROVENTIL) (2.5 MG/3ML) 0.083% nebulizer solution 3 mL  3 mL Inhalation Q6H PRN Mariel Craft, MD      . alum & mag hydroxide-simeth (MAALOX/MYLANTA) 200-200-20 MG/5ML suspension 30 mL  30 mL Oral Q4H PRN Mariel Craft, MD      . aspirin EC tablet 81 mg  81 mg Oral Daily Mariel Craft, MD   81 mg at 11/20/18 0916  . atorvastatin (LIPITOR) tablet 40 mg  40 mg Oral QPM Mariel Craft, MD   40 mg at 11/19/18 1750  . Brexpiprazole TABS 3 mg  3 mg Oral QHS Mariel Craft, MD   3 mg at 11/19/18 2207  . busPIRone (BUSPAR) tablet 15 mg  15 mg Oral TID Mariel Craft, MD   15 mg at 11/20/18 0916  . diclofenac sodium (VOLTAREN) 1 % transdermal gel 2 g  2 g Topical QID PRN Mariel Craft, MD      . FLUoxetine (PROZAC) capsule 60 mg  60 mg Oral Daily Mariel Craft, MD   60 mg at 11/20/18 0915  . fluticasone furoate-vilanterol (BREO ELLIPTA) 100-25 MCG/INH 1 puff  1 puff Inhalation Daily Hallaji, Mardene Speak, RPH   1 puff at 11/20/18 0917   And  . umeclidinium bromide (INCRUSE ELLIPTA) 62.5 MCG/INH 1 puff  1 puff Inhalation Daily Gardner Candle, RPH   1 puff at 11/20/18 0918  . folic acid (FOLVITE) tablet 1 mg  1 mg Oral Daily  Mariel Craft, MD   1 mg at 11/20/18 0916  . isosorbide mononitrate (IMDUR) 24 hr tablet 30 mg  30 mg Oral BID Mariel Craft, MD   30 mg at 11/20/18 5053  . magnesium hydroxide (MILK OF MAGNESIA) suspension 30 mL  30 mL Oral Daily PRN Mariel Craft, MD      . menthol-cetylpyridinium (CEPACOL) lozenge 3 mg  1 lozenge Oral PRN Mariel Craft, MD   3 mg at 11/19/18 1408  . methotrexate (RHEUMATREX) tablet 15 mg  15 mg Oral Weekly Mariel Craft, MD   15 mg at 11/17/18 0839  . metoprolol tartrate (LOPRESSOR) tablet 25 mg  25 mg Oral BID Mariel Craft, MD   25 mg at 11/20/18 9767  . midazolam (VERSED) injection 1 mg  1 mg Intravenous Once Clapacs, John T, MD      . mirtazapine (REMERON) tablet 30 mg  30 mg Oral QHS Mariel Craft, MD   30 mg at 11/19/18 2207  . nicotine (NICODERM CQ - dosed in mg/24 hours) patch 14 mg  14 mg Transdermal Daily Pucilowska, Jolanta B, MD   14 mg at 11/20/18 0925  . olopatadine (PATANOL) 0.1 % ophthalmic solution 1 drop  1 drop Both Eyes BID Mariel Craft, MD  1 drop at 11/18/18 0821  . oxyCODONE (Oxy IR/ROXICODONE) immediate release tablet 5 mg  5 mg Oral TID Mariel CraftMaurer, Sheila M, MD   5 mg at 11/20/18 0915  . pantoprazole (PROTONIX) EC tablet 40 mg  40 mg Oral Daily Mariel CraftMaurer, Sheila M, MD   40 mg at 11/20/18 0916  . white petrolatum (VASELINE) gel   Topical PRN Mariel CraftMaurer, Sheila M, MD      . zolpidem Remus Loffler(AMBIEN) tablet 5 mg  5 mg Oral QHS PRN Mariel CraftMaurer, Sheila M, MD       PTA Medications: Medications Prior to Admission  Medication Sig Dispense Refill Last Dose  . albuterol (PROVENTIL HFA;VENTOLIN HFA) 108 (90 Base) MCG/ACT inhaler Inhale 2 puffs into the lungs every 6 (six) hours as needed for wheezing or shortness of breath.    11/17/2018  . aspirin EC 81 MG tablet Take 81 mg by mouth daily.   11/17/2018  . atorvastatin (LIPITOR) 40 MG tablet Take 40 mg by mouth daily.   11/17/2018  . Brexpiprazole 3 MG TABS Take 3 mg by mouth at bedtime.   11/17/2018  . busPIRone  (BUSPAR) 15 MG tablet Take 15 mg by mouth 3 (three) times daily.    11/17/2018  . diclofenac sodium (VOLTAREN) 1 % GEL Apply 2 g topically 4 (four) times daily as needed (pain).   11/17/2018  . FLUoxetine (PROZAC) 20 MG capsule Take 60 mg by mouth daily.    11/17/2018  . folic acid (FOLVITE) 1 MG tablet Take 1 mg by mouth daily.   11/17/2018  . ibuprofen (ADVIL,MOTRIN) 200 MG tablet Take 200 mg by mouth every 6 (six) hours as needed for mild pain.    11/17/2018  . isosorbide mononitrate (IMDUR) 30 MG 24 hr tablet Take 30 mg by mouth 2 (two) times daily.   11/17/2018  . lisinopril-hydrochlorothiazide (PRINZIDE,ZESTORETIC) 20-12.5 MG tablet Take 1 tablet by mouth 2 (two) times daily.    11/18/2018 at Unknown time  . methotrexate (RHEUMATREX) 2.5 MG tablet Take 15 mg by mouth once a week.    11/17/2018  . metoprolol tartrate (LOPRESSOR) 25 MG tablet Take 25 mg by mouth 2 (two) times daily.   11/17/2018  . mirtazapine (REMERON) 30 MG tablet Take 30 mg by mouth at bedtime.   11/17/2018  . Olopatadine HCl 0.2 % SOLN Place 1 drop into both eyes daily.   11/17/2018  . omeprazole (PRILOSEC) 40 MG capsule Take 40 mg by mouth daily.   11/17/2018  . oxyCODONE (OXY IR/ROXICODONE) 5 MG immediate release tablet Take 5 mg by mouth 3 (three) times daily.   11/17/2018  . tiZANidine (ZANAFLEX) 4 MG tablet Take 4 mg by mouth 3 (three) times daily.    11/17/2018  . TRELEGY ELLIPTA 100-62.5-25 MCG/INH AEPB Inhale 1 puff into the lungs daily.   11/17/2018    Patient Stressors: Health problems Medication change or noncompliance Substance abuse  Patient Strengths: Ability for insight Barrister's clerkCommunication skills Motivation for treatment/growth  Treatment Modalities: Medication Management, Group therapy, Case management,  1 to 1 session with clinician, Psychoeducation, Recreational therapy.   Physician Treatment Plan for Primary Diagnosis: MDD (major depressive disorder), recurrent severe, without psychosis (HCC) Long Term Goal(s): Improvement in  symptoms so as ready for discharge Improvement in symptoms so as ready for discharge   Short Term Goals: Ability to identify changes in lifestyle to reduce recurrence of condition will improve Ability to verbalize feelings will improve Ability to disclose and discuss suicidal ideas Ability to demonstrate self-control  will improve Ability to identify and develop effective coping behaviors will improve Ability to maintain clinical measurements within normal limits will improve Ability to identify triggers associated with substance abuse/mental health issues will improve NA  Medication Management: Evaluate patient's response, side effects, and tolerance of medication regimen.  Therapeutic Interventions: 1 to 1 sessions, Unit Group sessions and Medication administration.  Evaluation of Outcomes: Adequate for Discharge  Physician Treatment Plan for Secondary Diagnosis: Principal Problem:   MDD (major depressive disorder), recurrent severe, without psychosis (HCC) Active Problems:   PTSD (post-traumatic stress disorder)   Fibromyalgia   Hypertension, essential   Long term current use of opiate analgesic   Tobacco use disorder  Long Term Goal(s): Improvement in symptoms so as ready for discharge Improvement in symptoms so as ready for discharge   Short Term Goals: Ability to identify changes in lifestyle to reduce recurrence of condition will improve Ability to verbalize feelings will improve Ability to disclose and discuss suicidal ideas Ability to demonstrate self-control will improve Ability to identify and develop effective coping behaviors will improve Ability to maintain clinical measurements within normal limits will improve Ability to identify triggers associated with substance abuse/mental health issues will improve NA     Medication Management: Evaluate patient's response, side effects, and tolerance of medication regimen.  Therapeutic Interventions: 1 to 1 sessions, Unit  Group sessions and Medication administration.  Evaluation of Outcomes: Adequate for Discharge   RN Treatment Plan for Primary Diagnosis: MDD (major depressive disorder), recurrent severe, without psychosis (HCC) Long Term Goal(s): Knowledge of disease and therapeutic regimen to maintain health will improve  Short Term Goals: Ability to demonstrate self-control, Ability to participate in decision making will improve, Ability to verbalize feelings will improve and Ability to disclose and discuss suicidal ideas  Medication Management: RN will administer medications as ordered by provider, will assess and evaluate patient's response and provide education to patient for prescribed medication. RN will report any adverse and/or side effects to prescribing provider.  Therapeutic Interventions: 1 on 1 counseling sessions, Psychoeducation, Medication administration, Evaluate responses to treatment, Monitor vital signs and CBGs as ordered, Perform/monitor CIWA, COWS, AIMS and Fall Risk screenings as ordered, Perform wound care treatments as ordered.  Evaluation of Outcomes: Adequate for Discharge   LCSW Treatment Plan for Primary Diagnosis: MDD (major depressive disorder), recurrent severe, without psychosis (HCC) Long Term Goal(s): Safe transition to appropriate next level of care at discharge, Engage patient in therapeutic group addressing interpersonal concerns.  Short Term Goals: Engage patient in aftercare planning with referrals and resources, Increase social support, Increase ability to appropriately verbalize feelings, Increase emotional regulation and Facilitate acceptance of mental health diagnosis and concerns  Therapeutic Interventions: Assess for all discharge needs, 1 to 1 time with Social worker, Explore available resources and support systems, Assess for adequacy in community support network, Educate family and significant other(s) on suicide prevention, Complete Psychosocial Assessment,  Interpersonal group therapy.  Evaluation of Outcomes: Adequate for Discharge   Progress in Treatment: Attending groups: Yes. Participating in groups: Yes. and No. Taking medication as prescribed: Yes. Toleration medication: Yes. Family/Significant other contact made: Yes, individual(s) contacted:  SPE completed with the patients sister.  Patient understands diagnosis: Yes. Discussing patient identified problems/goals with staff: Yes. Medical problems stabilized or resolved: Yes. Denies suicidal/homicidal ideation: Yes. Issues/concerns per patient self-inventory: No. Other: none  New problem(s) identified: No, Describe:  none  New Short Term/Long Term Goal(s): medication management for mood stabilization; elimination of SI thoughts; development of comprehensive mental  wellness/sobriety plan.  Patient Goals:  "to feel better"  Discharge Plan or Barriers: Pt identifies none.  Pt is current with [facility].  CSW has scheduled patient for her hospital discharge appointment, once she attends her hospital discharge appointment she can be scheudled with her regular psychiatrist and therapist.    Reason for Continuation of Hospitalization: Anxiety Depression Medication stabilization  Estimated Length of Stay: 1-5 days  Attendees: Patient: Teresa Lang 11/20/2018 9:47 AM  Physician: Dr. Toni Amend, MD 11/20/2018 9:47 AM  Nursing:  11/20/2018 9:47 AM  RN Care Manager: 11/20/2018 9:47 AM  Social Worker: Penni Homans, MSW, LCSW 11/20/2018 9:47 AM  Recreational Therapist:  11/20/2018 9:47 AM  Other:  11/20/2018 9:47 AM  Other:  11/20/2018 9:47 AM  Other: 11/20/2018 9:47 AM    Scribe for Treatment Team: [name], LCSW 11/20/2018 9:47 AM

## 2018-11-20 NOTE — Plan of Care (Signed)
Patient is alert oriented x 4 stable and responding well to treatment regimen, lack concentrations and slowness in activity, appetite is poor repeatedly going over thoughts.   Sleep is good and restful  , mood is dull and affect is sluggish, states fatigue , and easy startling, takes medications regularly with out any side effects , patient isolate her self from participations in group scheduled activities  .  Patient denies any SI/HI/AVH verbally contract for safety, discussed leaning a coping skills, engage appropriately , denies depression and anxiety at this time, 15 minute safety checks is maintained no distress notes.   Problem: Education: Goal: Knowledge of [facility] Education information/materials will improve Outcome: Progressing Goal: Emotional status will improve Outcome: Progressing Goal: Mental status will improve Outcome: Progressing Goal: Verbalization of understanding the information provided will improve Outcome: Progressing   Problem: Safety: Goal: Periods of time without injury will increase Outcome: Progressing   Problem: Education: Goal: Utilization of techniques to improve thought processes will improve Outcome: Progressing Goal: Knowledge of the prescribed therapeutic regimen will improve Outcome: Progressing   Problem: Safety: Goal: Ability to disclose and discuss suicidal ideas will improve Outcome: Progressing Goal: Ability to identify and utilize support systems that promote safety will improve Outcome: Progressing

## 2018-11-21 ENCOUNTER — Other Ambulatory Visit: Payer: Self-pay | Admitting: Psychiatry

## 2018-11-22 ENCOUNTER — Encounter: Payer: Self-pay | Admitting: Anesthesiology

## 2018-11-22 ENCOUNTER — Encounter
Admission: RE | Admit: 2018-11-22 | Discharge: 2018-11-22 | Disposition: A | Discharge status: Home / Self Care | Payer: Medicare HMO | Source: Ambulatory Visit | Attending: Psychiatry | Admitting: Psychiatry

## 2018-11-22 DIAGNOSIS — I10 Essential (primary) hypertension: Secondary | ICD-10-CM | POA: Insufficient documentation

## 2018-11-22 DIAGNOSIS — F419 Anxiety disorder, unspecified: Secondary | ICD-10-CM | POA: Diagnosis not present

## 2018-11-22 DIAGNOSIS — F17210 Nicotine dependence, cigarettes, uncomplicated: Secondary | ICD-10-CM | POA: Diagnosis not present

## 2018-11-22 DIAGNOSIS — E785 Hyperlipidemia, unspecified: Secondary | ICD-10-CM | POA: Diagnosis not present

## 2018-11-22 DIAGNOSIS — F332 Major depressive disorder, recurrent severe without psychotic features: Secondary | ICD-10-CM

## 2018-11-22 DIAGNOSIS — F339 Major depressive disorder, recurrent, unspecified: Secondary | ICD-10-CM | POA: Diagnosis present

## 2018-11-22 MED ORDER — GLYCOPYRROLATE 0.2 MG/ML IJ SOLN
0.1000 mg | Freq: Once | INTRAMUSCULAR | Status: AC
  Administered 2018-11-22: 0.1 mg via INTRAVENOUS

## 2018-11-22 MED ORDER — GLYCOPYRROLATE 0.2 MG/ML IJ SOLN: 0.1000 mg | Freq: Once | INTRAMUSCULAR | Status: DC

## 2018-11-22 MED ORDER — GLYCOPYRROLATE 0.2 MG/ML IJ SOLN
INTRAMUSCULAR | Status: AC
  Filled 2018-11-22: qty 1

## 2018-11-22 MED ORDER — HALOPERIDOL LACTATE 5 MG/ML IJ SOLN
INTRAMUSCULAR | Status: AC
  Filled 2018-11-22: qty 1

## 2018-11-22 MED ORDER — MIDAZOLAM HCL 2 MG/2ML IJ SOLN
INTRAMUSCULAR | Status: AC
  Filled 2018-11-22: qty 2

## 2018-11-22 MED ORDER — SODIUM CHLORIDE 0.9 % IV SOLN
500.0000 mL | Freq: Once | INTRAVENOUS | Status: AC
  Administered 2018-11-22: 10:00:00 via INTRAVENOUS

## 2018-11-22 MED ORDER — METHOHEXITAL SODIUM 100 MG/10ML IV SOSY
PREFILLED_SYRINGE | INTRAVENOUS | Status: DC | PRN
  Administered 2018-11-22: 80 mg via INTRAVENOUS

## 2018-11-22 MED ORDER — SUCCINYLCHOLINE CHLORIDE 200 MG/10ML IV SOSY
PREFILLED_SYRINGE | INTRAVENOUS | Status: DC | PRN
  Administered 2018-11-22: 100 mg via INTRAVENOUS

## 2018-11-22 MED ORDER — SODIUM CHLORIDE 0.9 % IV SOLN
500.0000 mL | Freq: Once | INTRAVENOUS | Status: AC
  Administered 2018-11-22: 500 mL via INTRAVENOUS

## 2018-11-22 MED ORDER — MIDAZOLAM HCL 2 MG/2ML IJ SOLN
INTRAMUSCULAR | Status: DC | PRN
  Administered 2018-11-22: 4 mg via INTRAVENOUS

## 2018-11-22 NOTE — Procedures (Signed)
ECT SERVICES Physician's Interval Evaluation & Treatment Note  Patient Identification: Teresa Lang MRN:  [id number] Date of Evaluation:  11/22/2018 TX #: 2  MADRS:   MMSE:   P.E. Findings:  No change to physical exam  Psychiatric Interval Note:  Flat depressed not psychotic not suicidal  Subjective:  Patient is a 63 y.o. female seen for evaluation for Electroconvulsive Therapy. Depressed  Treatment Summary:   [x]   Right Unilateral             []  Bilateral   % Energy : 0.3 ms 100%   Impedance: 2280 ohms  Seizure Energy Index: 5726 V squared  Postictal Suppression Index: 87%  Seizure Concordance Index: 95%  Medications  Pre Shock: Brevital 80 mg succinylcholine 100 mg  Post Shock: Versed 4 mg  Seizure Duration: 26 seconds EMG 52 seconds EEG   Comments: Follow-up Wednesday  Lungs:  [x]   Clear to auscultation               []  Other:   Heart:    [x]   Regular rhythm             []  irregular rhythm    [x]   Previous H&P reviewed, patient examined and there are NO CHANGES                 []   Previous H&P reviewed, patient examined and there are changes noted.   Mordecai Rasmussen, MD 2/14/202010:32 AM

## 2018-11-22 NOTE — Anesthesia Post-op Follow-up Note (Signed)
Anesthesia QCDR form completed.        

## 2018-11-22 NOTE — H&P (Signed)
Teresa Lang is an 63 y.o. female.   Chief Complaint: depression HPI: history of recurrent depression  Past Medical History:  Diagnosis Date  . Allergy    seasonal  . Anxiety   . Arthritis   . Depression   . Hyperlipidemia   . Hypertension     Past Surgical History:  Procedure Laterality Date  . ABDOMINAL HYSTERECTOMY  1983  . CHOLECYSTECTOMY  1983  . SPINE SURGERY  beginning 1994   laminectomies, fusions    Family History  Problem Relation Age of Onset  . Celiac disease Sister   . Breast cancer Neg Hx    Social History:  reports that she has been smoking cigarettes. She started smoking about 58 years ago. She has a 14.25 pack-year smoking history. She has never used smokeless tobacco. She reports current drug use. Frequency: 7.00 times per week. Drug: Marijuana. She reports that she does not drink alcohol.  Allergies:  Allergies  Allergen Reactions  . Prednisone Anxiety    (Not in a hospital admission)   No results found for this or any previous visit (from the past 48 hour(s)). No results found.  Review of Systems  Constitutional: Negative.   HENT: Negative.   Eyes: Negative.   Respiratory: Negative.   Cardiovascular: Negative.   Gastrointestinal: Negative.   Musculoskeletal: Negative.   Skin: Negative.   Neurological: Negative.   Psychiatric/Behavioral: Positive for depression. Negative for hallucinations, memory loss, substance abuse and suicidal ideas. The patient is not nervous/anxious and does not have insomnia.     Blood pressure 137/82, pulse 70, temperature 98.2 F (36.8 C), temperature source Oral, resp. rate 16, height 5\' 5"  (1.651 m), weight 84.8 kg, SpO2 96 %. Physical Exam  Nursing note and vitals reviewed. Constitutional: She appears well-developed and well-nourished.  HENT:  Head: Normocephalic and atraumatic.  Eyes: Pupils are equal, round, and reactive to light. Conjunctivae are normal.  Neck: Normal range of motion.   Cardiovascular: Regular rhythm and normal heart sounds.  Respiratory: Effort normal. No respiratory distress.  GI: Soft.  Musculoskeletal: Normal range of motion.  Neurological: She is alert.  Skin: Skin is warm and dry.  Psychiatric: Judgment normal. Her affect is blunt. Her speech is delayed. She is slowed. She expresses no homicidal and no suicidal ideation. She exhibits abnormal recent memory.     Assessment/Plan ect today and continue into next week  Mordecai Rasmussen, MD 11/22/2018, 9:59 AM

## 2018-11-22 NOTE — Transfer of Care (Signed)
Immediate Anesthesia Transfer of Care Note  Patient: Teresa Lang  Procedure(s) Performed: ECT TX  Patient Location: PACU  Anesthesia Type:General  Level of Consciousness: sedated  Airway & Oxygen Therapy: Patient Spontanous Breathing and Patient connected to face mask oxygen  Post-op Assessment: Report given to RN and Post -op Vital signs reviewed and stable  Post vital signs: Reviewed and stable  Last Vitals:  Vitals Value Taken Time  BP    Temp    Pulse 98 11/22/2018 10:49 AM  Resp 18 11/22/2018 10:49 AM  SpO2 100 % 11/22/2018 10:49 AM  Vitals shown include unvalidated device data.  Last Pain:  Vitals:   11/22/18 0819  TempSrc: Oral  PainSc: 2          Complications: No apparent anesthesia complications

## 2018-11-22 NOTE — Discharge Instructions (Signed)
1)  The drugs that you have been given will stay in your system until tomorrow so for the       next 24 hours you should not:  A. Drive an automobile  B. Make any legal decisions  C. Drink any alcoholic beverages  2)  You may resume your regular meals upon return home.  3)  A responsible adult must take you home.  Someone should stay with you for a few          hours, then be available by phone for the remainder of the treatment day.  4)  You May experience any of the following symptoms:  Headache, Nausea and a dry mouth (due to the medications you were given),  temporary memory loss and some confusion, or sore muscles (a warm bath  should help this).  If you you experience any of these symptoms let us know on                your return visit.  5)  Report any of the following: any acute discomfort, severe headache, or temperature        greater than 100.5 F.   Also report any unusual redness, swelling, drainage, or pain         at your IV site.    You may report Symptoms to:  ECT PROGRAM- [place] [facility]          Phone: [phone], ECT Department           or Dr. Shary Key office [phone]  6)  Your next ECT Treatment is Wednesday February 19 at 8:30   We will call 2 days prior to your scheduled appointment for arrival times.  7)  Nothing to eat or drink after midnight the night before your procedure.  8)  Take      With a sip of water the morning of your procedure.  9)  Other Instructions: Call [phone] to cancel the morning of your procedure due         to illness or emergency.  10) We will call within 72 hours to assess how you are feeling.

## 2018-11-22 NOTE — Anesthesia Preprocedure Evaluation (Signed)
Anesthesia Evaluation  Patient identified by MRN, date of birth, ID band Patient awake    Reviewed: Allergy & Precautions, NPO status , Patient's Chart, lab work & pertinent test results  History of Anesthesia Complications Negative for: history of anesthetic complications  Airway Mallampati: II  TM Distance: >3 FB Neck ROM: Full    Dental  (+) Upper Dentures   Pulmonary neg sleep apnea, neg COPD, Current Smoker,    breath sounds clear to auscultation- rhonchi (-) wheezing      Cardiovascular hypertension, Pt. on medications + Past MI (takotsubo)  (-) CAD, (-) Cardiac Stents and (-) CABG  Rhythm:Regular Rate:Normal - Systolic murmurs and - Diastolic murmurs    Neuro/Psych neg Seizures PSYCHIATRIC DISORDERS Anxiety Depression negative neurological ROS     GI/Hepatic negative GI ROS, Neg liver ROS,   Endo/Other  negative endocrine ROSneg diabetes  Renal/GU negative Renal ROS     Musculoskeletal  (+) Arthritis , Fibromyalgia -  Abdominal (+) + obese,   Peds  Hematology negative hematology ROS (+)   Anesthesia Other Findings Past Medical History: No date: Allergy     Comment:  seasonal No date: Anxiety No date: Arthritis No date: Depression No date: Hyperlipidemia No date: Hypertension   Reproductive/Obstetrics                             Anesthesia Physical  Anesthesia Plan  ASA: III  Anesthesia Plan: General   Post-op Pain Management:    Induction: Intravenous  PONV Risk Score and Plan: 1 and Ondansetron  Airway Management Planned: Mask  Additional Equipment:   Intra-op Plan:   Post-operative Plan:   Informed Consent: I have reviewed the patients History and Physical, chart, labs and discussed the procedure including the risks, benefits and alternatives for the proposed anesthesia with the patient or authorized representative who has indicated his/her understanding and  acceptance.     Dental advisory given  Plan Discussed with: CRNA and Anesthesiologist  Anesthesia Plan Comments:         Anesthesia Quick Evaluation

## 2018-11-22 NOTE — Anesthesia Postprocedure Evaluation (Signed)
Anesthesia Post Note  Patient: Arnela Hirko  Procedure(s) Performed: ECT TX  Patient location during evaluation: PACU Anesthesia Type: General Level of consciousness: awake and alert Pain management: pain level controlled Vital Signs Assessment: post-procedure vital signs reviewed and stable Respiratory status: spontaneous breathing, nonlabored ventilation and respiratory function stable Cardiovascular status: blood pressure returned to baseline and stable Postop Assessment: no signs of nausea or vomiting Anesthetic complications: no     Last Vitals:  Vitals:   11/22/18 1119 11/22/18 1125  BP: (!) 147/89 (!) 143/73  Pulse: 92 88  Resp:  16  Temp:  37 C  SpO2: 92% 93%    Last Pain:  Vitals:   11/22/18 1125  TempSrc:   PainSc: 0-No pain                 Jannine Abreu

## 2018-11-25 ENCOUNTER — Other Ambulatory Visit: Payer: Self-pay | Admitting: Psychiatry

## 2018-11-27 ENCOUNTER — Encounter: Payer: Self-pay | Admitting: Anesthesiology

## 2018-11-27 ENCOUNTER — Encounter (HOSPITAL_BASED_OUTPATIENT_CLINIC_OR_DEPARTMENT_OTHER)
Admission: RE | Admit: 2018-11-27 | Discharge: 2018-11-27 | Disposition: A | Discharge status: Home / Self Care | Payer: Medicare HMO | Source: Ambulatory Visit | Attending: Psychiatry | Admitting: Psychiatry

## 2018-11-27 DIAGNOSIS — F332 Major depressive disorder, recurrent severe without psychotic features: Secondary | ICD-10-CM

## 2018-11-27 DIAGNOSIS — F339 Major depressive disorder, recurrent, unspecified: Secondary | ICD-10-CM | POA: Diagnosis not present

## 2018-11-27 MED ORDER — MIDAZOLAM HCL 2 MG/2ML IJ SOLN: 4.0000 mg | Freq: Once | INTRAMUSCULAR | Status: DC

## 2018-11-27 MED ORDER — SUCCINYLCHOLINE CHLORIDE 200 MG/10ML IV SOSY
PREFILLED_SYRINGE | INTRAVENOUS | Status: DC | PRN
  Administered 2018-11-27: 80 mg via INTRAVENOUS

## 2018-11-27 MED ORDER — GLYCOPYRROLATE 0.2 MG/ML IJ SOLN
INTRAMUSCULAR | Status: AC
  Filled 2018-11-27: qty 1

## 2018-11-27 MED ORDER — KETOROLAC TROMETHAMINE 30 MG/ML IJ SOLN
INTRAMUSCULAR | Status: AC
  Filled 2018-11-27: qty 1

## 2018-11-27 MED ORDER — SUCCINYLCHOLINE CHLORIDE 20 MG/ML IJ SOLN
INTRAMUSCULAR | Status: AC
  Filled 2018-11-27: qty 1

## 2018-11-27 MED ORDER — SODIUM CHLORIDE 0.9 % IV SOLN
INTRAVENOUS | Status: DC | PRN
  Administered 2018-11-27: 09:00:00 via INTRAVENOUS

## 2018-11-27 MED ORDER — METHOHEXITAL SODIUM 100 MG/10ML IV SOSY
PREFILLED_SYRINGE | INTRAVENOUS | Status: DC | PRN
  Administered 2018-11-27: 80 mg via INTRAVENOUS

## 2018-11-27 MED ORDER — MIDAZOLAM HCL 2 MG/2ML IJ SOLN
INTRAMUSCULAR | Status: AC
  Filled 2018-11-27: qty 2

## 2018-11-27 MED ORDER — KETOROLAC TROMETHAMINE 30 MG/ML IJ SOLN
30.0000 mg | Freq: Once | INTRAMUSCULAR | Status: AC
  Administered 2018-11-27: 30 mg via INTRAVENOUS

## 2018-11-27 MED ORDER — ONDANSETRON HCL 4 MG/2ML IJ SOLN: 4.0000 mg | Freq: Once | INTRAMUSCULAR | Status: DC | PRN

## 2018-11-27 MED ORDER — SODIUM CHLORIDE 0.9 % IV SOLN
500.0000 mL | Freq: Once | INTRAVENOUS | Status: AC
  Administered 2018-11-27: 500 mL via INTRAVENOUS

## 2018-11-27 MED ORDER — GLYCOPYRROLATE 0.2 MG/ML IJ SOLN
0.1000 mg | Freq: Once | INTRAMUSCULAR | Status: AC
  Administered 2018-11-27: 0.1 mg via INTRAVENOUS

## 2018-11-27 MED ORDER — MIDAZOLAM HCL 2 MG/2ML IJ SOLN
INTRAMUSCULAR | Status: DC | PRN
  Administered 2018-11-27 (×2): 2 mg via INTRAVENOUS

## 2018-11-27 NOTE — Anesthesia Postprocedure Evaluation (Signed)
Anesthesia Post Note  Patient: Teresa Lang  Procedure(s) Performed: ECT TX  Patient location during evaluation: PACU Anesthesia Type: General Level of consciousness: awake and alert Pain management: pain level controlled Vital Signs Assessment: post-procedure vital signs reviewed and stable Respiratory status: spontaneous breathing and respiratory function stable Cardiovascular status: stable Anesthetic complications: no     Last Vitals:  Vitals:   11/27/18 1115 11/27/18 1125  BP: 116/77 108/70  Pulse: 73 68  Resp: 15 16  Temp: 36.8 C   SpO2: 99% 99%    Last Pain:  Vitals:   11/27/18 1125  TempSrc:   PainSc: 0-No pain                 [name] K

## 2018-11-27 NOTE — Discharge Instructions (Signed)
1)  The drugs that you have been given will stay in your system until tomorrow so for the       next 24 hours you should not:  A. Drive an automobile  B. Make any legal decisions  C. Drink any alcoholic beverages  2)  You may resume your regular meals upon return home.  3)  A responsible adult must take you home.  Someone should stay with you for a few          hours, then be available by phone for the remainder of the treatment day.  4)  You May experience any of the following symptoms:  Headache, Nausea and a dry mouth (due to the medications you were given),  temporary memory loss and some confusion, or sore muscles (a warm bath  should help this).  If you you experience any of these symptoms let us know on                your return visit.  5)  Report any of the following: any acute discomfort, severe headache, or temperature        greater than 100.5 F.   Also report any unusual redness, swelling, drainage, or pain         at your IV site.    You may report Symptoms to:  ECT PROGRAM- [place] at [facility]          Phone: [phone], ECT Department           or Dr. Shary Key office [phone]  6)  Your next ECT Treatment is Friday February 21 at 8:30   We will call 2 days prior to your scheduled appointment for arrival times.  7)  Nothing to eat or drink after midnight the night before your procedure.  8)  Take     With a sip of water the morning of your procedure.  9)  Other Instructions: Call [phone] to cancel the morning of your procedure due         to illness or emergency.  10) We will call within 72 hours to assess how you are feeling.

## 2018-11-27 NOTE — H&P (Signed)
Teresa Lang is an 63 y.o. female.   Chief Complaint: continued depression without suicidal intent HPI: hx recurrent depression  Past Medical History:  Diagnosis Date  . Allergy    seasonal  . Anxiety   . Arthritis   . Depression   . Hyperlipidemia   . Hypertension     Past Surgical History:  Procedure Laterality Date  . ABDOMINAL HYSTERECTOMY  1983  . CHOLECYSTECTOMY  1983  . SPINE SURGERY  beginning 1994   laminectomies, fusions    Family History  Problem Relation Age of Onset  . Celiac disease Sister   . Breast cancer Neg Hx    Social History:  reports that she has been smoking cigarettes. She started smoking about 58 years ago. She has a 14.25 pack-year smoking history. She has never used smokeless tobacco. She reports current drug use. Frequency: 7.00 times per week. Drug: Marijuana. She reports that she does not drink alcohol.  Allergies:  Allergies  Allergen Reactions  . Prednisone Anxiety    (Not in a hospital admission)   No results found for this or any previous visit (from the past 48 hour(s)). No results found.  Review of Systems  Constitutional: Negative.   HENT: Negative.   Eyes: Negative.   Respiratory: Negative.   Cardiovascular: Negative.   Gastrointestinal: Negative.   Musculoskeletal: Negative.   Skin: Negative.   Neurological: Negative.   Psychiatric/Behavioral: Positive for depression and memory loss. Negative for hallucinations, substance abuse and suicidal ideas. The patient is not nervous/anxious and does not have insomnia.     Blood pressure 108/70, pulse 80, temperature 98.2 F (36.8 C), temperature source Oral, resp. rate 16, SpO2 95 %. Physical Exam  Nursing note and vitals reviewed. Constitutional: She appears well-developed and well-nourished.  HENT:  Head: Normocephalic and atraumatic.  Eyes: Pupils are equal, round, and reactive to light. Conjunctivae are normal.  Neck: Normal range of motion.  Cardiovascular:  Regular rhythm and normal heart sounds.  Respiratory: Effort normal. No respiratory distress.  GI: Soft.  Musculoskeletal: Normal range of motion.  Neurological: She is alert.  Skin: Skin is warm and dry.  Psychiatric: She has a normal mood and affect. Her behavior is normal. Judgment and thought content normal.     Assessment/Plan Continue index couse  Teresa Rasmussen, MD 11/27/2018, 9:54 AM

## 2018-11-27 NOTE — Anesthesia Procedure Notes (Signed)
Date/Time: 11/27/2018 10:55 AM Performed by: Lily Kocher, CRNA Pre-anesthesia Checklist: Patient identified, Emergency Drugs available, Suction available and Patient being monitored Patient Re-evaluated:Patient Re-evaluated prior to induction Oxygen Delivery Method: Circle system utilized Preoxygenation: Pre-oxygenation with 100% oxygen Induction Type: IV induction Ventilation: Mask ventilation without difficulty and Mask ventilation throughout procedure Airway Equipment and Method: Bite block Placement Confirmation: positive ETCO2 Dental Injury: Teeth and Oropharynx as per pre-operative assessment

## 2018-11-27 NOTE — Transfer of Care (Signed)
Immediate Anesthesia Transfer of Care Note  Patient: Teresa Lang  Procedure(s) Performed: ECT TX  Patient Location: PACU  Anesthesia Type:General  Level of Consciousness: sedated  Airway & Oxygen Therapy: Patient Spontanous Breathing and Patient connected to face mask oxygen  Post-op Assessment: Report given to RN and Post -op Vital signs reviewed and stable  Post vital signs: Reviewed and stable  Last Vitals:  Vitals Value Taken Time  BP 126/63 11/27/2018 11:05 AM  Temp 36.7 C 11/27/2018 11:05 AM  Pulse 76 11/27/2018 11:05 AM  Resp 18 11/27/2018 11:05 AM  SpO2 99 % 11/27/2018 11:05 AM    Last Pain:  Vitals:   11/27/18 1105  TempSrc: Tympanic  PainSc: Asleep         Complications: No apparent anesthesia complications

## 2018-11-27 NOTE — Procedures (Signed)
ECT SERVICES Physician's Interval Evaluation & Treatment Note  Patient Identification: Teresa Lang MRN:  [id number] Date of Evaluation:  11/27/2018 TX #: 3  MADRS:   MMSE:   P.E. Findings:  No change in physical exam  Psychiatric Interval Note:  Patient says she is having memory problems.  Subjective:  Patient is a 63 y.o. female seen for evaluation for Electroconvulsive Therapy. Mood stable possibly a little improved  Treatment Summary:   [x]   Right Unilateral             []  Bilateral   % Energy : 0.3 ms 100%   Impedance: 2000 ohms  Seizure Energy Index: 8968 V squared  Postictal Suppression Index: 89%  Seizure Concordance Index: 92%  Medications  Pre Shock: Brevital 80 mg succinylcholine 100 mg  Post Shock: Versed 4 mg  Seizure Duration: EMG 18 seconds EEG 32 seconds   Comments: Follow-up Friday  Lungs:  [x]   Clear to auscultation               []  Other:   Heart:    [x]   Regular rhythm             []  irregular rhythm    [x]   Previous H&P reviewed, patient examined and there are NO CHANGES                 []   Previous H&P reviewed, patient examined and there are changes noted.   Mordecai Rasmussen, MD 2/19/202010:59 AM

## 2018-11-27 NOTE — Anesthesia Post-op Follow-up Note (Signed)
Anesthesia QCDR form completed.        

## 2018-11-27 NOTE — Anesthesia Preprocedure Evaluation (Signed)
Anesthesia Evaluation  Patient identified by MRN, date of birth, ID band Patient awake    Reviewed: Allergy & Precautions, NPO status , Patient's Chart, lab work & pertinent test results  History of Anesthesia Complications Negative for: history of anesthetic complications  Airway Mallampati: II  TM Distance: >3 FB Neck ROM: Full    Dental  (+) Upper Dentures   Pulmonary neg sleep apnea, neg COPD, Current Smoker,    breath sounds clear to auscultation- rhonchi (-) wheezing      Cardiovascular hypertension, Pt. on medications + Past MI (takotsubo)  (-) CAD, (-) Cardiac Stents and (-) CABG  Rhythm:Regular Rate:Normal - Systolic murmurs and - Diastolic murmurs    Neuro/Psych neg Seizures PSYCHIATRIC DISORDERS Anxiety Depression negative neurological ROS     GI/Hepatic negative GI ROS, Neg liver ROS,   Endo/Other  negative endocrine ROSneg diabetes  Renal/GU negative Renal ROS     Musculoskeletal  (+) Arthritis , Fibromyalgia -  Abdominal (+) + obese,   Peds  Hematology negative hematology ROS (+)   Anesthesia Other Findings    Reproductive/Obstetrics                             Anesthesia Physical  Anesthesia Plan  ASA: III  Anesthesia Plan: General   Post-op Pain Management:    Induction: Intravenous  PONV Risk Score and Plan: 1 and Ondansetron  Airway Management Planned: Mask  Additional Equipment:   Intra-op Plan:   Post-operative Plan:   Informed Consent: I have reviewed the patients History and Physical, chart, labs and discussed the procedure including the risks, benefits and alternatives for the proposed anesthesia with the patient or authorized representative who has indicated his/her understanding and acceptance.       Plan Discussed with: CRNA and Anesthesiologist  Anesthesia Plan Comments:         Anesthesia Quick Evaluation  

## 2018-11-28 ENCOUNTER — Other Ambulatory Visit: Payer: Self-pay | Admitting: Psychiatry

## 2018-11-29 ENCOUNTER — Ambulatory Visit
Admission: RE | Admit: 2018-11-29 | Discharge: 2018-11-29 | Disposition: A | Discharge status: Home / Self Care | Payer: Medicare HMO | Source: Ambulatory Visit | Attending: Psychiatry | Admitting: Psychiatry

## 2018-11-29 DIAGNOSIS — E785 Hyperlipidemia, unspecified: Secondary | ICD-10-CM | POA: Insufficient documentation

## 2018-11-29 DIAGNOSIS — F17210 Nicotine dependence, cigarettes, uncomplicated: Secondary | ICD-10-CM | POA: Insufficient documentation

## 2018-11-29 DIAGNOSIS — F419 Anxiety disorder, unspecified: Secondary | ICD-10-CM | POA: Diagnosis not present

## 2018-11-29 DIAGNOSIS — F339 Major depressive disorder, recurrent, unspecified: Secondary | ICD-10-CM | POA: Diagnosis present

## 2018-11-29 DIAGNOSIS — F332 Major depressive disorder, recurrent severe without psychotic features: Secondary | ICD-10-CM | POA: Diagnosis not present

## 2018-11-29 DIAGNOSIS — I10 Essential (primary) hypertension: Secondary | ICD-10-CM | POA: Diagnosis not present

## 2018-11-29 MED ORDER — KETOROLAC TROMETHAMINE 30 MG/ML IJ SOLN
30.0000 mg | Freq: Once | INTRAMUSCULAR | Status: AC
  Administered 2018-11-29: 30 mg via INTRAVENOUS

## 2018-11-29 MED ORDER — GLYCOPYRROLATE 0.2 MG/ML IJ SOLN
0.1000 mg | Freq: Once | INTRAMUSCULAR | Status: AC
  Administered 2018-11-29: 0.1 mg via INTRAVENOUS

## 2018-11-29 MED ORDER — MIDAZOLAM HCL 5 MG/5ML IJ SOLN
INTRAMUSCULAR | Status: DC | PRN
  Administered 2018-11-29: 4 mg via INTRAVENOUS
  Administered 2018-11-29: 2 mg via INTRAVENOUS

## 2018-11-29 MED ORDER — SUCCINYLCHOLINE CHLORIDE 20 MG/ML IJ SOLN
INTRAMUSCULAR | Status: AC
  Filled 2018-11-29: qty 1

## 2018-11-29 MED ORDER — METHOHEXITAL SODIUM 100 MG/10ML IV SOSY
PREFILLED_SYRINGE | INTRAVENOUS | Status: DC | PRN
  Administered 2018-11-29: 80 mg via INTRAVENOUS

## 2018-11-29 MED ORDER — METHOHEXITAL SODIUM 0.5 G IJ SOLR
INTRAMUSCULAR | Status: AC
  Filled 2018-11-29: qty 500

## 2018-11-29 MED ORDER — GLYCOPYRROLATE 0.2 MG/ML IJ SOLN
INTRAMUSCULAR | Status: AC
  Administered 2018-11-29: 0.1 mg via INTRAVENOUS
  Filled 2018-11-29: qty 1

## 2018-11-29 MED ORDER — LABETALOL HCL 5 MG/ML IV SOLN
INTRAVENOUS | Status: DC | PRN
  Administered 2018-11-29 (×2): 10 mg via INTRAVENOUS

## 2018-11-29 MED ORDER — SODIUM CHLORIDE 0.9 % IV SOLN
500.0000 mL | Freq: Once | INTRAVENOUS | Status: AC
  Administered 2018-11-29: 10:00:00 via INTRAVENOUS

## 2018-11-29 MED ORDER — MIDAZOLAM HCL 2 MG/2ML IJ SOLN
INTRAMUSCULAR | Status: AC
  Filled 2018-11-29: qty 4

## 2018-11-29 MED ORDER — MIDAZOLAM HCL 2 MG/2ML IJ SOLN
INTRAMUSCULAR | Status: AC
  Filled 2018-11-29: qty 2

## 2018-11-29 MED ORDER — LABETALOL HCL 5 MG/ML IV SOLN
INTRAVENOUS | Status: AC
  Filled 2018-11-29: qty 4

## 2018-11-29 MED ORDER — MIDAZOLAM HCL 2 MG/2ML IJ SOLN: 4.0000 mg | Freq: Once | INTRAMUSCULAR | Status: DC

## 2018-11-29 MED ORDER — ONDANSETRON HCL 4 MG/2ML IJ SOLN: 4.0000 mg | Freq: Once | INTRAMUSCULAR | Status: DC | PRN

## 2018-11-29 MED ORDER — SUCCINYLCHOLINE CHLORIDE 20 MG/ML IJ SOLN
INTRAMUSCULAR | Status: DC | PRN
  Administered 2018-11-29: 100 mg via INTRAVENOUS

## 2018-11-29 MED ORDER — KETOROLAC TROMETHAMINE 30 MG/ML IJ SOLN
INTRAMUSCULAR | Status: AC
  Administered 2018-11-29: 30 mg via INTRAVENOUS
  Filled 2018-11-29: qty 1

## 2018-11-29 NOTE — Anesthesia Postprocedure Evaluation (Signed)
Anesthesia Post Note  Patient: Teresa Lang  Procedure(s) Performed: ECT TX  Patient location during evaluation: Endoscopy Anesthesia Type: General Level of consciousness: awake and alert Pain management: pain level controlled Vital Signs Assessment: post-procedure vital signs reviewed and stable Respiratory status: spontaneous breathing and respiratory function stable Cardiovascular status: stable Anesthetic complications: no     Last Vitals:  Vitals:   11/29/18 0857 11/29/18 1036  BP: (!) 156/71 (!) 139/128  Pulse: 63 92  Resp: 16 17  Temp: 37 C 37.3 C  SpO2: 98% 98%    Last Pain:  Vitals:   11/29/18 1036  TempSrc: Temporal  PainSc:                  [name]

## 2018-11-29 NOTE — Anesthesia Post-op Follow-up Note (Signed)
Anesthesia QCDR form completed.        

## 2018-11-29 NOTE — Anesthesia Preprocedure Evaluation (Signed)
Anesthesia Evaluation  Patient identified by MRN, date of birth, ID band Patient awake    Reviewed: Allergy & Precautions, NPO status , Patient's Chart, lab work & pertinent test results  History of Anesthesia Complications Negative for: history of anesthetic complications  Airway Mallampati: II  TM Distance: >3 FB Neck ROM: Full    Dental  (+) Upper Dentures   Pulmonary neg sleep apnea, neg COPD, Current Smoker,    breath sounds clear to auscultation- rhonchi (-) wheezing      Cardiovascular hypertension, Pt. on medications + Past MI (takotsubo)  (-) CAD, (-) Cardiac Stents and (-) CABG  Rhythm:Regular Rate:Normal - Systolic murmurs and - Diastolic murmurs    Neuro/Psych neg Seizures PSYCHIATRIC DISORDERS Anxiety Depression negative neurological ROS     GI/Hepatic negative GI ROS, Neg liver ROS,   Endo/Other  negative endocrine ROSneg diabetes  Renal/GU negative Renal ROS     Musculoskeletal  (+) Arthritis , Fibromyalgia -  Abdominal (+) + obese,   Peds  Hematology negative hematology ROS (+)   Anesthesia Other Findings    Reproductive/Obstetrics                             Anesthesia Physical  Anesthesia Plan  ASA: III  Anesthesia Plan: General   Post-op Pain Management:    Induction: Intravenous  PONV Risk Score and Plan: 1 and Ondansetron  Airway Management Planned: Mask  Additional Equipment:   Intra-op Plan:   Post-operative Plan:   Informed Consent: I have reviewed the patients History and Physical, chart, labs and discussed the procedure including the risks, benefits and alternatives for the proposed anesthesia with the patient or authorized representative who has indicated his/her understanding and acceptance.       Plan Discussed with: CRNA and Anesthesiologist  Anesthesia Plan Comments:         Anesthesia Quick Evaluation

## 2018-11-29 NOTE — Transfer of Care (Signed)
Immediate Anesthesia Transfer of Care Note  Patient: Teresa Lang  Procedure(s) Performed: ECT TX  Patient Location: PACU  Anesthesia Type:General  Level of Consciousness: awake  Airway & Oxygen Therapy: Patient Spontanous Breathing and Patient connected to face mask oxygen  Post-op Assessment: Report given to RN and Post -op Vital signs reviewed and stable  Post vital signs: Reviewed  Last Vitals:  Vitals Value Taken Time  BP 153/133 11/29/2018 10:37 AM  Temp 37.3 C 11/29/2018 10:36 AM  Pulse 89 11/29/2018 10:37 AM  Resp 22 11/29/2018 10:37 AM  SpO2 98 % 11/29/2018 10:37 AM  Vitals shown include unvalidated device data.  Last Pain:  Vitals:   11/29/18 0859  TempSrc:   PainSc: 0-No pain         Complications: No apparent anesthesia complications

## 2018-11-29 NOTE — Procedures (Signed)
ECT SERVICES Physician's Interval Evaluation & Treatment Note  Patient Identification: Teresa Lang MRN:  [id number] Date of Evaluation:  11/29/2018 TX #: 4  MADRS:   MMSE:   P.E. Findings:  No change to physical  Psychiatric Interval Note:  Patient reports no change in mood but having significant memory impairment  Subjective:  Patient is a 63 y.o. female seen for evaluation for Electroconvulsive Therapy. Planes of feeling "scattered"  Treatment Summary:   [x]   Right Unilateral             []  Bilateral   % Energy : 0.3 ms 100%   Impedance: 2360 ohms  Seizure Energy Index: 12,934 V squared  Postictal Suppression Index: 73%  Seizure Concordance Index: 97%  Medications  Pre Shock: Brevital 80 mg succinylcholine 100 mg  Post Shock: Versed 4 mg  Seizure Duration: 19 seconds EMG 31 seconds EEG   Comments: Continue treatment course at least into next week  Lungs:  [x]   Clear to auscultation               []  Other:   Heart:    [x]   Regular rhythm             []  irregular rhythm    [x]   Previous H&P reviewed, patient examined and there are NO CHANGES                 []   Previous H&P reviewed, patient examined and there are changes noted.   Mordecai Rasmussen, MD 2/21/202010:09 AM

## 2018-11-29 NOTE — H&P (Signed)
Teresa Lang is an 63 y.o. female.   Chief Complaint: Patient has continued to feel depressed also feeling "scattered" HPI: History of recurrent severe depression  Past Medical History:  Diagnosis Date  . Allergy    seasonal  . Anxiety   . Arthritis   . Depression   . Hyperlipidemia   . Hypertension     Past Surgical History:  Procedure Laterality Date  . ABDOMINAL HYSTERECTOMY  1983  . CHOLECYSTECTOMY  1983  . SPINE SURGERY  beginning 1994   laminectomies, fusions    Family History  Problem Relation Age of Onset  . Celiac disease Sister   . Breast cancer Neg Hx    Social History:  reports that she has been smoking cigarettes. She started smoking about 58 years ago. She has a 14.25 pack-year smoking history. She has never used smokeless tobacco. She reports current drug use. Frequency: 7.00 times per week. Drug: Marijuana. She reports that she does not drink alcohol.  Allergies:  Allergies  Allergen Reactions  . Prednisone Anxiety    (Not in a hospital admission)   No results found for this or any previous visit (from the past 48 hour(s)). No results found.  Review of Systems  Constitutional: Negative.   HENT: Negative.   Eyes: Negative.   Respiratory: Negative.   Cardiovascular: Negative.   Gastrointestinal: Negative.   Musculoskeletal: Negative.   Skin: Negative.   Neurological: Negative.   Psychiatric/Behavioral: Positive for depression. Negative for hallucinations, memory loss, substance abuse and suicidal ideas. The patient is not nervous/anxious and does not have insomnia.     Blood pressure (!) 156/71, pulse 63, temperature 98.6 F (37 C), temperature source Oral, resp. rate 16, SpO2 98 %. Physical Exam  Nursing note and vitals reviewed. Constitutional: She appears well-developed and well-nourished.  HENT:  Head: Normocephalic and atraumatic.  Eyes: Pupils are equal, round, and reactive to light. Conjunctivae are normal.  Neck: Normal  range of motion.  Cardiovascular: Regular rhythm and normal heart sounds.  Respiratory: Effort normal.  GI: Soft.  Musculoskeletal: Normal range of motion.  Neurological: She is alert.  Skin: Skin is warm and dry.  Psychiatric: Judgment normal. Her affect is blunt. Her speech is delayed. She is slowed. She expresses no suicidal ideation. She exhibits abnormal recent memory.     Assessment/Plan No sign of suicidality no sign of dangerousness at home.  Continue with ECT treatment into next week.  Mordecai Rasmussen, MD 11/29/2018, 10:07 AM

## 2018-11-29 NOTE — Discharge Instructions (Signed)
1)  The drugs that you have been given will stay in your system until tomorrow so for the       next 24 hours you should not:  A. Drive an automobile  B. Make any legal decisions  C. Drink any alcoholic beverages  2)  You may resume your regular meals upon return home.  3)  A responsible adult must take you home.  Someone should stay with you for a few          hours, then be available by phone for the remainder of the treatment day.  4)  You May experience any of the following symptoms:  Headache, Nausea and a dry mouth (due to the medications you were given),  temporary memory loss and some confusion, or sore muscles (a warm bath  should help this).  If you you experience any of these symptoms let us know on                your return visit.  5)  Report any of the following: any acute discomfort, severe headache, or temperature        greater than 100.5 F.   Also report any unusual redness, swelling, drainage, or pain         at your IV site.    You may report Symptoms to:  ECT PROGRAM- [place] at [facility]          Phone: [phone], ECT Department           or Dr. Shary Key office [phone]  6)  Your next ECT Treatment is Monday February 24 at 8:00  We will call 2 days prior to your scheduled appointment for arrival times.  7)  Nothing to eat or drink after midnight the night before your procedure.  8)  Take      With a sip of water the morning of your procedure.  9)  Other Instructions: Call [phone] to cancel the morning of your procedure due         to illness or emergency.  10) We will call within 72 hours to assess how you are feeling.

## 2018-12-01 ENCOUNTER — Other Ambulatory Visit: Payer: Self-pay | Admitting: Psychiatry

## 2018-12-02 ENCOUNTER — Ambulatory Visit
Admission: RE | Admit: 2018-12-02 | Discharge: 2018-12-02 | Disposition: A | Discharge status: Home / Self Care | Payer: Medicare HMO | Source: Ambulatory Visit | Attending: Psychiatry | Admitting: Psychiatry

## 2018-12-02 ENCOUNTER — Encounter: Payer: Self-pay | Admitting: Anesthesiology

## 2018-12-02 DIAGNOSIS — F329 Major depressive disorder, single episode, unspecified: Secondary | ICD-10-CM | POA: Diagnosis present

## 2018-12-02 MED ORDER — SUCCINYLCHOLINE CHLORIDE 20 MG/ML IJ SOLN
INTRAMUSCULAR | Status: AC
  Filled 2018-12-02: qty 1

## 2018-12-02 MED ORDER — SODIUM CHLORIDE 0.9 % IV SOLN
500.0000 mL | Freq: Once | INTRAVENOUS | Status: AC
  Administered 2018-12-02: 500 mL via INTRAVENOUS

## 2018-12-02 MED ORDER — METHOHEXITAL SODIUM 0.5 G IJ SOLR
INTRAMUSCULAR | Status: AC
  Filled 2018-12-02: qty 500

## 2018-12-02 MED ORDER — MIDAZOLAM HCL 2 MG/2ML IJ SOLN
INTRAMUSCULAR | Status: AC
  Filled 2018-12-02: qty 2

## 2018-12-02 MED ORDER — LABETALOL HCL 5 MG/ML IV SOLN
INTRAVENOUS | Status: AC
  Filled 2018-12-02: qty 4

## 2018-12-02 MED ORDER — MIDAZOLAM HCL 5 MG/5ML IJ SOLN
INTRAMUSCULAR | Status: AC
  Filled 2018-12-02: qty 5

## 2018-12-02 NOTE — H&P (Signed)
PAtient complains of a lot of memory impairment and confusion and feels she is no better with her mood. Does not want to continue beyond today. Unlikely to benefit from today's treatment and likely to get more side effects. Suggest canceling today's treatment

## 2018-12-08 DIAGNOSIS — I208 Other forms of angina pectoris: Secondary | ICD-10-CM | POA: Insufficient documentation

## 2019-02-13 ENCOUNTER — Other Ambulatory Visit: Payer: Self-pay | Admitting: Physician Assistant

## 2019-02-13 DIAGNOSIS — Z1231 Encounter for screening mammogram for malignant neoplasm of breast: Secondary | ICD-10-CM

## 2019-02-21 ENCOUNTER — Telehealth: Payer: Self-pay

## 2019-03-15 ENCOUNTER — Other Ambulatory Visit: Payer: Self-pay

## 2019-03-15 ENCOUNTER — Encounter: Payer: Self-pay | Admitting: Emergency Medicine

## 2019-03-15 ENCOUNTER — Emergency Department
Admission: EM | Admit: 2019-03-15 | Discharge: 2019-03-15 | Disposition: A | Discharge status: Home / Self Care | Payer: Medicare HMO | Attending: Emergency Medicine | Admitting: Emergency Medicine

## 2019-03-15 DIAGNOSIS — F419 Anxiety disorder, unspecified: Secondary | ICD-10-CM

## 2019-03-15 DIAGNOSIS — Z79899 Other long term (current) drug therapy: Secondary | ICD-10-CM | POA: Insufficient documentation

## 2019-03-15 DIAGNOSIS — Z7982 Long term (current) use of aspirin: Secondary | ICD-10-CM | POA: Diagnosis not present

## 2019-03-15 DIAGNOSIS — E860 Dehydration: Secondary | ICD-10-CM | POA: Insufficient documentation

## 2019-03-15 DIAGNOSIS — F17210 Nicotine dependence, cigarettes, uncomplicated: Secondary | ICD-10-CM | POA: Diagnosis not present

## 2019-03-15 DIAGNOSIS — F331 Major depressive disorder, recurrent, moderate: Secondary | ICD-10-CM | POA: Insufficient documentation

## 2019-03-15 DIAGNOSIS — I10 Essential (primary) hypertension: Secondary | ICD-10-CM | POA: Insufficient documentation

## 2019-03-15 DIAGNOSIS — F1210 Cannabis abuse, uncomplicated: Secondary | ICD-10-CM | POA: Diagnosis not present

## 2019-03-15 LAB — BASIC METABOLIC PANEL
Anion gap: 8 (ref 5–15)
BUN: 26 mg/dL — ABNORMAL HIGH (ref 8–23)
CO2: 29 mmol/L (ref 22–32)
Calcium: 8.8 mg/dL — ABNORMAL LOW (ref 8.9–10.3)
Chloride: 105 mmol/L (ref 98–111)
Creatinine, Ser: 1.81 mg/dL — ABNORMAL HIGH (ref 0.44–1.00)
GFR calc Af Amer: 34 mL/min — ABNORMAL LOW (ref >60)
GFR calc non Af Amer: 29 mL/min — ABNORMAL LOW (ref >60)
Glucose, Bld: 94 mg/dL (ref 70–99)
Potassium: 3.1 mmol/L — ABNORMAL LOW (ref 3.5–5.1)
Sodium: 142 mmol/L (ref 135–145)

## 2019-03-15 LAB — CBC WITH DIFFERENTIAL/PLATELET
Abs Immature Granulocytes: 0.01 10*3/uL (ref 0.00–0.07)
Basophils Absolute: 0 10*3/uL (ref 0.0–0.1)
Basophils Relative: 1 %
Eosinophils Absolute: 0.3 10*3/uL (ref 0.0–0.5)
Eosinophils Relative: 5 %
HCT: 38.8 % (ref 36.0–46.0)
Hemoglobin: 13.1 g/dL (ref 12.0–15.0)
Immature Granulocytes: 0 %
Lymphocytes Relative: 38 %
Lymphs Abs: 2.3 10*3/uL (ref 0.7–4.0)
MCH: 31.3 pg (ref 26.0–34.0)
MCHC: 33.8 g/dL (ref 30.0–36.0)
MCV: 92.8 fL (ref 80.0–100.0)
Monocytes Absolute: 0.6 10*3/uL (ref 0.1–1.0)
Monocytes Relative: 10 %
Neutro Abs: 2.8 10*3/uL (ref 1.7–7.7)
Neutrophils Relative %: 46 %
Platelets: 231 10*3/uL (ref 150–400)
RBC: 4.18 MIL/uL (ref 3.87–5.11)
RDW: 13.9 % (ref 11.5–15.5)
WBC: 6.2 10*3/uL (ref 4.0–10.5)
nRBC: 0 % (ref 0.0–0.2)

## 2019-03-15 LAB — COMPREHENSIVE METABOLIC PANEL
ALT: 29 U/L (ref 0–44)
AST: 60 U/L — ABNORMAL HIGH (ref 15–41)
Albumin: 3.9 g/dL (ref 3.5–5.0)
Alkaline Phosphatase: 82 U/L (ref 38–126)
Anion gap: 11 (ref 5–15)
BUN: 29 mg/dL — ABNORMAL HIGH (ref 8–23)
CO2: 28 mmol/L (ref 22–32)
Calcium: 9.9 mg/dL (ref 8.9–10.3)
Chloride: 102 mmol/L (ref 98–111)
Creatinine, Ser: 1.93 mg/dL — ABNORMAL HIGH (ref 0.44–1.00)
GFR calc Af Amer: 31 mL/min — ABNORMAL LOW (ref >60)
GFR calc non Af Amer: 27 mL/min — ABNORMAL LOW (ref >60)
Glucose, Bld: 116 mg/dL — ABNORMAL HIGH (ref 70–99)
Potassium: 3.4 mmol/L — ABNORMAL LOW (ref 3.5–5.1)
Sodium: 141 mmol/L (ref 135–145)
Total Bilirubin: 1 mg/dL (ref 0.3–1.2)
Total Protein: 7.6 g/dL (ref 6.5–8.1)

## 2019-03-15 LAB — SALICYLATE LEVEL: Salicylate Lvl: <7 mg/dL (ref 2.8–30.0)

## 2019-03-15 LAB — ACETAMINOPHEN LEVEL: Acetaminophen (Tylenol), Serum: <10 ug/mL — ABNORMAL LOW (ref 10–30)

## 2019-03-15 LAB — ETHANOL: Alcohol, Ethyl (B): <10 mg/dL (ref <10)

## 2019-03-15 MED ORDER — FLUOXETINE HCL 20 MG PO CAPS
60.0000 mg | ORAL_CAPSULE | Freq: Every day | ORAL | Status: DC
  Administered 2019-03-15: 60 mg via ORAL

## 2019-03-15 MED ORDER — BUSPIRONE HCL 15 MG PO TABS: 15.0000 mg | ORAL_TABLET | Freq: Three times a day (TID) | ORAL | 1 refills | Status: DC

## 2019-03-15 MED ORDER — BREXPIPRAZOLE 3 MG PO TABS: 3.0000 mg | ORAL_TABLET | Freq: Every day | ORAL | Status: DC

## 2019-03-15 MED ORDER — FLUOXETINE HCL 20 MG PO CAPS: 60.0000 mg | ORAL_CAPSULE | Freq: Every day | ORAL | 1 refills | Status: DC

## 2019-03-15 MED ORDER — SODIUM CHLORIDE 0.9 % IV BOLUS: 1000.0000 mL | Freq: Once | INTRAVENOUS | Status: DC

## 2019-03-15 MED ORDER — BREXPIPRAZOLE 3 MG PO TABS: 3.0000 mg | ORAL_TABLET | Freq: Every day | ORAL | 1 refills | Status: DC

## 2019-03-15 MED ORDER — SODIUM CHLORIDE 0.9 % IV BOLUS
1000.0000 mL | Freq: Once | INTRAVENOUS | Status: AC
  Administered 2019-03-15: 1000 mL via INTRAVENOUS

## 2019-03-15 MED ORDER — BUSPIRONE HCL 5 MG PO TABS
15.0000 mg | ORAL_TABLET | Freq: Three times a day (TID) | ORAL | Status: DC
  Administered 2019-03-15: 15 mg via ORAL

## 2019-03-15 MED ORDER — MIRTAZAPINE 30 MG PO TABS: 15.0000 mg | ORAL_TABLET | Freq: Every day | ORAL | 1 refills | Status: DC

## 2019-03-15 MED ORDER — MIRTAZAPINE 15 MG PO TABS: 30.0000 mg | ORAL_TABLET | Freq: Every day | ORAL | Status: DC

## 2019-03-15 NOTE — ED Triage Notes (Signed)
Patient arrives from home states "I just started feeling bad again".  States was here in February for same and states feelings returned a couple of weeks ago.  Patient states her medications had been changed but is unable to afford all medications.  Patient states "Im not handling things well.  I'm crying a lot again".  Denies SI/ HI

## 2019-03-15 NOTE — ED Provider Notes (Addendum)
[facility] Emergency Department Provider Note  ____________________________________________   I have reviewed the triage vital signs and the nursing notes. Where available I have reviewed prior notes and, if possible and indicated, outside hospital notes.    HISTORY  Chief Complaint Mental Health Problem    HPI Teresa Lang is a 63 y.o. female  Patient seen and evaluated during the coronavirus epidemic during a time with low staffing patient here with anxiety and depression no SI no HI, having trouble sleeping, long history of same.  Did not take an overdose.  No particular inciting events.  Not taking all of her medications.  Did not take an overdose.   Past Medical History:  Diagnosis Date  . Allergy    seasonal  . Anxiety   . Arthritis   . Depression   . Hyperlipidemia   . Hypertension     Patient Active Problem List   Diagnosis Date Noted  . MDD (major depressive disorder), recurrent severe, without psychosis (HCC) 11/14/2018  . Anxiety 11/14/2018  . Tobacco use disorder 10/16/2018  . Myofascial pain dysfunction syndrome 03/21/2018  . Cervico-occipital neuralgia of right side 03/21/2018  . Cervical post-laminectomy syndrome 02/18/2018  . Chronic upper back pain (Primary Area of Pain) (right) 01/02/2018  . Occipital neuralgia of right side (Secondary Area of Pain) 01/02/2018  . Chronic neck pain (Tertiary Area of Pain)(R) 01/02/2018  . Chronic bilateral low back pain without sciatica (Fourth Area of Pain) 01/02/2018  . Chronic generalized pain 01/02/2018  . Chronic pain syndrome 01/02/2018  . Long term current use of opiate analgesic 01/02/2018  . Pharmacologic therapy 01/02/2018  . Disorder of skeletal system 01/02/2018  . Problems influencing health status 01/02/2018  . PTSD (post-traumatic stress disorder) 12/14/2017  . Fibromyalgia 12/14/2017  . Hyperlipemia, mixed 12/14/2017  . Hypertension, essential 12/14/2017  .  Moderate episode of recurrent major depressive disorder (HCC) 12/14/2017  . Pain of cervical spine 12/14/2017  . Rheumatoid arthritis (HCC) 12/14/2017  . Rheumatoid arthritis involving multiple sites with positive rheumatoid factor (HCC) 12/14/2017    Past Surgical History:  Procedure Laterality Date  . ABDOMINAL HYSTERECTOMY  1983  . CHOLECYSTECTOMY  1983  . SPINE SURGERY  beginning 1994   laminectomies, fusions    Prior to Admission medications   Medication Sig Start Date End Date Taking? Authorizing Provider  albuterol (PROVENTIL HFA;VENTOLIN HFA) 108 (90 Base) MCG/ACT inhaler Inhale 2 puffs into the lungs every 6 (six) hours as needed for wheezing or shortness of breath. 11/20/18   Clapacs, Jackquline Denmark, MD  aspirin EC 81 MG tablet Take 81 mg by mouth daily.    [provider]  atorvastatin (LIPITOR) 40 MG tablet Take 1 tablet (40 mg total) by mouth every evening. 11/20/18   Clapacs, Jackquline Denmark, MD  Brexpiprazole 3 MG TABS Take 3 mg by mouth at bedtime. 11/20/18   Clapacs, Jackquline Denmark, MD  busPIRone (BUSPAR) 15 MG tablet Take 1 tablet (15 mg total) by mouth 3 (three) times daily. 11/20/18   Clapacs, Jackquline Denmark, MD  diclofenac sodium (VOLTAREN) 1 % GEL Apply 2 g topically 4 (four) times daily as needed (pain).    [provider]  FLUoxetine (PROZAC) 20 MG capsule Take 3 capsules (60 mg total) by mouth daily. 11/20/18   Clapacs, Jackquline Denmark, MD  fluticasone furoate-vilanterol (BREO ELLIPTA) 100-25 MCG/INH AEPB Inhale 1 puff into the lungs daily. 11/21/18   Clapacs, Jackquline Denmark, MD  folic acid (FOLVITE) 1 MG  tablet Take 1 mg by mouth daily. 02/15/18   [provider]  ibuprofen (ADVIL,MOTRIN) 200 MG tablet Take 200 mg by mouth every 6 (six) hours as needed for mild pain.     [provider]  isosorbide mononitrate (IMDUR) 30 MG 24 hr tablet Take 1 tablet (30 mg total) by mouth 2 (two) times daily. 11/20/18   Clapacs, Jackquline DenmarkJohn T, MD  methotrexate (RHEUMATREX) 2.5 MG tablet Take 15 mg by mouth  once a week.  10/16/18   [provider]  metoprolol tartrate (LOPRESSOR) 25 MG tablet Take 1 tablet (25 mg total) by mouth 2 (two) times daily. 11/20/18   Clapacs, Jackquline DenmarkJohn T, MD  mirtazapine (REMERON) 30 MG tablet Take 1 tablet (30 mg total) by mouth at bedtime. 11/20/18   Clapacs, Jackquline DenmarkJohn T, MD  olopatadine (PATANOL) 0.1 % ophthalmic solution Place 1 drop into both eyes 2 (two) times daily. 11/20/18   Clapacs, Jackquline DenmarkJohn T, MD  oxyCODONE (OXY IR/ROXICODONE) 5 MG immediate release tablet Take 5 mg by mouth 3 (three) times daily. 11/02/18   [provider]  pantoprazole (PROTONIX) 40 MG tablet Take 1 tablet (40 mg total) by mouth daily. 11/21/18   Clapacs, Jackquline DenmarkJohn T, MD  tiZANidine (ZANAFLEX) 4 MG tablet Take 4 mg by mouth 3 (three) times daily.     [provider]  umeclidinium bromide (INCRUSE ELLIPTA) 62.5 MCG/INH AEPB Inhale 1 puff into the lungs daily. 11/21/18   Clapacs, Jackquline DenmarkJohn T, MD    Allergies Prednisone  Family History  Problem Relation Age of Onset  . Celiac disease Sister   . Breast cancer Neg Hx     Social History Social History   Tobacco Use  . Smoking status: Current Every Day Smoker    Packs/day: 0.25    Years: 57.00    Pack years: 14.25    Types: Cigarettes    Start date: 431962  . Smokeless tobacco: Never Used  . Tobacco comment: Also VAPES on a daily basis  Substance Use Topics  . Alcohol use: Never    Frequency: Never  . Drug use: Yes    Frequency: 7.0 times per week    Types: Marijuana    Comment: "maybe once in the evening"; also uses "KRATUM, the herb, to helps with pain and anxiety"    Review of Systems Constitutional: No fever/chills Eyes: No visual changes. ENT: No sore throat. No stiff neck no neck pain Cardiovascular: Denies chest pain. Respiratory: Denies shortness of breath. Gastrointestinal:   no vomiting.  No diarrhea.  No constipation. Genitourinary: Negative for dysuria. Musculoskeletal: Negative lower extremity swelling Skin:  Negative for rash. Neurological: Negative for severe headaches, focal weakness or numbness.   ____________________________________________   PHYSICAL EXAM:  VITAL SIGNS: ED Triage Vitals  Enc Vitals Group     BP 03/15/19 1314 100/64     Pulse Rate 03/15/19 1314 91     Resp 03/15/19 1314 16     Temp 03/15/19 1314 98.3 F (36.8 C)     Temp Source 03/15/19 1314 Oral     SpO2 03/15/19 1314 94 %     Weight 03/15/19 1312 177 lb 0.5 oz (80.3 kg)     Height 03/15/19 1312 5\' 5"  (1.651 m)     Head Circumference --      Peak Flow --      Pain Score 03/15/19 1312 0     Pain Loc --      Pain Edu? --  Excl. in [place]? --     Constitutional: Alert and oriented. Well appearing and in no acute distress. Eyes: Conjunctivae are normal Head: Atraumatic HEENT: No congestion/rhinnorhea. Mucous membranes are moist.  Oropharynx non-erythematous Neck:   Nontender with no meningismus, no masses, no stridor Cardiovascular: Normal rate, regular rhythm. Grossly normal heart sounds.  Good peripheral circulation. Respiratory: Normal respiratory effort.  No retractions. Lungs CTAB. Abdominal: Soft and nontender. No distention. No guarding no rebound Back:  There is no focal tenderness or step off.  there is no midline tenderness there are no lesions noted. there is no CVA tenderness Musculoskeletal: No lower extremity tenderness, no upper extremity tenderness. No joint effusions, no DVT signs strong distal pulses no edema Neurologic:  Normal speech and language. No gross focal neurologic deficits are appreciated.  Skin:  Skin is warm, dry and intact. No rash noted. Psychiatric: Mood and affect are   anxious and upset speech   ____________________________________________   LABS (all labs ordered are listed, but only abnormal results are displayed)  Labs Reviewed  COMPREHENSIVE METABOLIC PANEL - Abnormal; Notable for the following components:      Result Value   Potassium 3.4 (*)    Glucose, Bld  116 (*)    BUN 29 (*)    Creatinine, Ser 1.93 (*)    AST 60 (*)    GFR calc non Af Amer 27 (*)    GFR calc Af Amer 31 (*)    All other components within normal limits  CBC WITH DIFFERENTIAL/PLATELET  ETHANOL  ACETAMINOPHEN LEVEL  SALICYLATE LEVEL  URINALYSIS, COMPLETE (UACMP) WITH MICROSCOPIC  URINE DRUG SCREEN, QUALITATIVE ([facility] ONLY)    Pertinent labs  results that were available during my care of the patient were reviewed by me and considered in my medical decision making (see chart for details). ____________________________________________  EKG  I personally interpreted any EKGs ordered by me or triage  ____________________________________________  RADIOLOGY  Pertinent labs & imaging results that were available during my care of the patient were reviewed by me and considered in my medical decision making (see chart for details). If possible, patient and/or family made aware of any abnormal findings.  No results found. ____________________________________________    PROCEDURES  Procedure(s) performed: None  Procedures  Critical Care performed: None  ____________________________________________   INITIAL IMPRESSION / ASSESSMENT AND PLAN / ED COURSE  Pertinent labs & imaging results that were available during my care of the patient were reviewed by me and considered in my medical decision making (see chart for details).  Patient here with thoughts of anxiety and depression.  No SI no HI.  She does she has been eating or drinking very much and she is clearly somewhat dehydrated elevated creatinine over baseline is noted we will give her IV fluids and have psychiatry evaluate her.  ----------------------------------------- 3:05 PM on 03/15/2019 -----------------------------------------  Signed out to dr. Cinda Quest at the end of my shift.    ____________________________________________   FINAL CLINICAL IMPRESSION(S) / ED DIAGNOSES  Final diagnoses:  None       This chart was dictated using voice recognition software.  Despite best efforts to proofread,  errors can occur which can change meaning.      Schuyler Amor, MD 03/15/19 1444    Schuyler Amor, MD 03/15/19 [phone]

## 2019-03-15 NOTE — ED Notes (Signed)
Patient discharged home, patient received discharge papers and prescriptions. Patient received belongings and verbalized she has received all of her belongings. Patient appropriate and cooperative, Denies SI/HI AVH. Vital signs taken. NAD noted. 

## 2019-03-15 NOTE — BH Assessment (Signed)
Assessment Note  Teresa Lang is an 63 y.o. female who presents to the ER seeking assistance with her medications. She states, her medications was changed and she is noticing changes in her mood. "I'm feeling down. I'm like I was back in February (2019)."  The medications she was taking were working but she was unable to afford them. She was receiving samples from her psychiatrist, with [facility].  During the interview, the patient was calm, cooperative and pleasant. She was able to provided appropriate answers to the questions. She denies SI/HI and AV/H.  She also denies the use of mind-altering substances. Per her record, she has a history of substance use.   The patient reports ECT worked for her in the past but it was stopped because it was affecting her memory.  Diagnosis: Depression  Past Medical History:  Past Medical History:  Diagnosis Date  . Allergy    seasonal  . Anxiety   . Arthritis   . Depression   . Hyperlipidemia   . Hypertension     Past Surgical History:  Procedure Laterality Date  . ABDOMINAL HYSTERECTOMY  1983  . CHOLECYSTECTOMY  1983  . SPINE SURGERY  beginning 1994   laminectomies, fusions    Family History:  Family History  Problem Relation Age of Onset  . Celiac disease Sister   . Breast cancer Neg Hx     Social History:  reports that she has been smoking cigarettes. She started smoking about 58 years ago. She has a 14.25 pack-year smoking history. She has never used smokeless tobacco. She reports current drug use. Frequency: 7.00 times per week. Drug: Marijuana. She reports that she does not drink alcohol.  Additional Social History:  Alcohol / Drug Use Pain Medications: See PTA Prescriptions: See PTA Over the Counter: See PTA History of alcohol / drug use?: No history of alcohol / drug abuse Longest period of sobriety (when/how long): Reports of none  CIWA: CIWA-Ar BP: 104/70 Pulse Rate: 86 COWS:    Allergies:  Allergies  Allergen  Reactions  . Prednisone Anxiety    Home Medications: (Not in a hospital admission)   OB/GYN Status:  No LMP recorded. Patient is postmenopausal.  General Assessment Data Location of Assessment: [facility] ED TTS Assessment: In system Is this a Tele or Face-to-Face Assessment?: Face-to-Face Is this an Initial Assessment or a Re-assessment for this encounter?: Initial Assessment Language Other than English: No Living Arrangements: Other (Comment)(Private Home) What gender do you identify as?: Female Marital status: Single Pregnancy Status: No Living Arrangements: Other relatives(Lives with sister) Can pt return to current living arrangement?: Yes Admission Status: Voluntary Is patient capable of signing voluntary admission?: Yes Referral Source: Self/Family/Friend Insurance type: Community education officer Medicare  Medical Screening Exam [facility] Walk-in ONLY) Medical Exam completed: Yes  Crisis Care Plan Living Arrangements: Other relatives(Lives with sister) Legal Guardian: Other:(Self) Name of Psychiatrist: Dr. Georjean Mode ([facility]) Name of Therapist: [facility]  Education Status Is patient currently in school?: No Is the patient employed, unemployed or receiving disability?: Unemployed, Receiving disability income  Risk to self with the past 6 months Suicidal Ideation: No Has patient been a risk to self within the past 6 months prior to admission? : No Suicidal Intent: No Has patient had any suicidal intent within the past 6 months prior to admission? : No Is patient at risk for suicide?: No Suicidal Plan?: No Has patient had any suicidal plan within the past 6 months prior to admission? : No Access to Means: No What  has been your use of drugs/alcohol within the last 12 months?: Reports of no use Previous Attempts/Gestures: Yes How many times?: 1 Other Self Harm Risks: Reports of none Triggers for Past Attempts: Other (Comment) Intentional Self Injurious Behavior: None Family Suicide History: Unknown Recent  stressful life event(s): Other (Comment) Persecutory voices/beliefs?: No Depression: Yes Depression Symptoms: Isolating, Fatigue, Feeling worthless/self pity, Loss of interest in usual pleasures Substance abuse history and/or treatment for substance abuse?: No Suicide prevention information given to non-admitted patients: Not applicable  Risk to Others within the past 6 months Homicidal Ideation: No Does patient have any lifetime risk of violence toward others beyond the six months prior to admission? : No Thoughts of Harm to Others: No Current Homicidal Intent: No Current Homicidal Plan: No Access to Homicidal Means: No Identified Victim: Reports of none History of harm to others?: No Assessment of Violence: None Noted Violent Behavior Description: Reports of none Does patient have access to weapons?: No Criminal Charges Pending?: No Does patient have a court date: No Is patient on probation?: No  Psychosis Hallucinations: None noted Delusions: None noted  Mental Status Report Appearance/Hygiene: Unremarkable, In scrubs Eye Contact: Good Motor Activity: Freedom of movement, Unremarkable Speech: Logical/coherent, Unremarkable Level of Consciousness: Alert Mood: Depressed, Pleasant, Sad Affect: Appropriate to circumstance, Depressed, Sad Anxiety Level: None Thought Processes: Coherent, Relevant Judgement: Unimpaired Orientation: Person, Place, Time, Situation, Appropriate for developmental age Obsessive Compulsive Thoughts/Behaviors: None  Cognitive Functioning Concentration: Normal Memory: Recent Intact, Remote Intact Is patient IDD: No Insight: Fair Impulse Control: Fair Appetite: Good Have you had any weight changes? : No Change Sleep: No Change Total Hours of Sleep: 7 Vegetative Symptoms: None  ADLScreening [facility] Assessment Services) Patient's cognitive ability adequate to safely complete daily activities?: Yes Patient able to express need for assistance with  ADLs?: Yes Independently performs ADLs?: Yes (appropriate for developmental age)  Prior Inpatient Therapy Prior Inpatient Therapy: Yes Prior Therapy Dates: 11/2018 Prior Therapy Facilty/Provider(s): [facility] Reason for Treatment: Depression  Prior Outpatient Therapy Prior Outpatient Therapy: Yes Prior Therapy Dates: Current Prior Therapy Facilty/Provider(s): [facility] Reason for Treatment: Depression Does patient have an ACCT team?: No Does patient have Intensive In-House Services?  : No Does patient have [facility] services? : No Does patient have P4CC services?: No  ADL Screening (condition at time of admission) Patient's cognitive ability adequate to safely complete daily activities?: Yes Is the patient deaf or have difficulty hearing?: No Does the patient have difficulty seeing, even when wearing glasses/contacts?: No Does the patient have difficulty concentrating, remembering, or making decisions?: No Patient able to express need for assistance with ADLs?: Yes Does the patient have difficulty dressing or bathing?: No Independently performs ADLs?: Yes (appropriate for developmental age) Does the patient have difficulty walking or climbing stairs?: No Weakness of Legs: None Weakness of Arms/Hands: None  Home Assistive Devices/Equipment Home Assistive Devices/Equipment: None  Therapy Consults (therapy consults require a physician order) PT Evaluation Needed: No OT Evalulation Needed: No SLP Evaluation Needed: No Abuse/Neglect Assessment (Assessment to be complete while patient is alone) Abuse/Neglect Assessment Can Be Completed: Yes Physical Abuse: Denies Verbal Abuse: Denies Sexual Abuse: Denies Exploitation of patient/patient's resources: Denies Self-Neglect: Denies Values / Beliefs Cultural Requests During Hospitalization: None Spiritual Requests During Hospitalization: None Consults Spiritual Care Consult Needed: No Social Work Consult Needed: No Merchant navy officer  (For Healthcare) Does Patient Have a Medical Advance Directive?: No Would patient like information on creating a medical advance directive?: No - Patient declined  Child/Adolescent Assessment Running Away Risk: Denies(Patient is an adult)  Disposition:  Disposition Initial Assessment Completed for this Encounter: Yes  On Site Evaluation by:   Reviewed with Physician:    Gunnar Fusi MS, [place], [facility], [place], [place] Therapeutic Triage Specialist 03/15/2019 7:09 PM

## 2019-03-15 NOTE — Consult Note (Signed)
[facility] Discharge  03/15/2019 5:06 PM Teresa Lang  MRN:  [id number] Principal Problem: Major depressive disorder, recurrent episode, moderate degree (HCC) Discharge Diagnoses: Principal Problem:   Major depressive disorder, recurrent episode, moderate degree (HCC)  Subjective: "I was starting to feel bad again."  She reports not being able to afford her medication and stopped taking it.    HPI:  63 yo female who presented to the ED with depression and anxiety, history of depression and anxiety.  [facility] and received ECT.  Stabilized with her medications. However, her provider left [facility] and she is unable to get samples, unsure if she can get Rx from their pharmacy as she has not tried.  The medication she cannot afford at a regular pharmacy is brexpiprazole.  She felt the best she has in a long time on this medication with Prozac, Buspar, and Remeron.    On evaluation today in person with TTS, she denies suicidal/homicidal ideations, hallucinations, and substance abuse.  She lives with her sister and has not [facility] to [facility].  She would like to return for ECT but reports she was not allowed as it was negatively effecting her memory.  Discussed the possibility of TMS and she was interested.  The plan is to provide her with a Rx to take to [facility] for brexpiprazole and she will explore TMS from the resources provided.    Total Time spent with patient: 45 minutes  Past Psychiatric History: depression, anxiety  Past Medical History:  Past Medical History:  Diagnosis Date  . Allergy    seasonal  . Anxiety   . Arthritis   . Depression   . Hyperlipidemia   . Hypertension     Past Surgical History:  Procedure Laterality Date  . ABDOMINAL HYSTERECTOMY  1983  . CHOLECYSTECTOMY  1983  . SPINE SURGERY  beginning 1994   laminectomies, fusions   Family History:  Family History  Problem Relation Age of Onset  . Celiac  disease Sister   . Breast cancer Neg Hx    Family Psychiatric  History: none  Social History:  Social History   Substance and Sexual Activity  Alcohol Use Never  . Frequency: Never     Social History   Substance and Sexual Activity  Drug Use Yes  . Frequency: 7.0 times per week  . Types: Marijuana   Comment: "maybe once in the evening"; also uses "KRATUM, the herb, to helps with pain and anxiety"    Social History   Socioeconomic History  . Marital status: Widowed    Spouse name: Not on file  . Number of children: Not on file  . Years of education: Not on file  . Highest education level: Not on file  Occupational History  . Occupation: disability  Social Needs  . Financial resource strain: Not on file  . Food insecurity:    Worry: Not on file    Inability: Not on file  . Transportation needs:    Medical: Not on file    Non-medical: Not on file  Tobacco Use  . Smoking status: Current Every Day Smoker    Packs/day: 0.25    Years: 57.00    Pack years: 14.25    Types: Cigarettes    Start date: 601962  . Smokeless tobacco: Never Used  . Tobacco comment: Also VAPES on a daily basis  Substance and Sexual Activity  . Alcohol use: Never  Frequency: Never  . Drug use: Yes    Frequency: 7.0 times per week    Types: Marijuana    Comment: "maybe once in the evening"; also uses "KRATUM, the herb, to helps with pain and anxiety"  . Sexual activity: Not on file  Lifestyle  . Physical activity:    Days per week: Not on file    Minutes per session: Not on file  . Stress: Not on file  Relationships  . Social connections:    Talks on phone: Not on file    Gets together: Not on file    Attends religious service: Not on file    Active member of club or organization: Not on file    Attends meetings of clubs or organizations: Not on file    Relationship status: Not on file  Other Topics Concern  . Not on file  Social History Narrative   Lives with sister    Has this  patient used any form of tobacco in the last 30 days? (Cigarettes, Smokeless Tobacco, Cigars, and/or Pipes) none  Current Medications: Current Facility-Administered Medications  Medication Dose Route Frequency Provider Last Rate Last Dose  . Brexpiprazole TABS 3 mg  3 mg Oral QHS Patrecia Pour, NP      . busPIRone (BUSPAR) tablet 15 mg  15 mg Oral TID Patrecia Pour, NP      . FLUoxetine (PROZAC) capsule 60 mg  60 mg Oral Daily Patrecia Pour, NP      . mirtazapine (REMERON) tablet 30 mg  30 mg Oral QHS Waylan Boga Y, NP      . sodium chloride 0.9 % bolus 1,000 mL  1,000 mL Intravenous Once Schuyler Amor, MD       Current Outpatient Medications  Medication Sig Dispense Refill  . albuterol (PROVENTIL HFA;VENTOLIN HFA) 108 (90 Base) MCG/ACT inhaler Inhale 2 puffs into the lungs every 6 (six) hours as needed for wheezing or shortness of breath. 1 Inhaler 1  . aspirin EC 81 MG tablet Take 81 mg by mouth daily.    [name] Kitchen atorvastatin (LIPITOR) 40 MG tablet Take 1 tablet (40 mg total) by mouth every evening. 30 tablet 1  . Brexpiprazole 3 MG TABS Take 3 mg by mouth at bedtime. 30 tablet 1  . busPIRone (BUSPAR) 15 MG tablet Take 1 tablet (15 mg total) by mouth 3 (three) times daily. 90 tablet 1  . diclofenac sodium (VOLTAREN) 1 % GEL Apply 2 g topically 4 (four) times daily as needed (pain).    [name] Kitchen FLUoxetine (PROZAC) 20 MG capsule Take 3 capsules (60 mg total) by mouth daily. 90 capsule 1  . fluticasone furoate-vilanterol (BREO ELLIPTA) 100-25 MCG/INH AEPB Inhale 1 puff into the lungs daily. 1 each 1  . folic acid (FOLVITE) 1 MG tablet Take 1 mg by mouth daily.    [name] Kitchen ibuprofen (ADVIL,MOTRIN) 200 MG tablet Take 200 mg by mouth every 6 (six) hours as needed for mild pain.     . isosorbide mononitrate (IMDUR) 30 MG 24 hr tablet Take 1 tablet (30 mg total) by mouth 2 (two) times daily. 60 tablet 1  . methotrexate (RHEUMATREX) 2.5 MG tablet Take 15 mg by mouth once a week.     . metoprolol tartrate  (LOPRESSOR) 25 MG tablet Take 1 tablet (25 mg total) by mouth 2 (two) times daily. 60 tablet 1  . mirtazapine (REMERON) 30 MG tablet Take 0.5 tablets (15 mg total) by mouth at bedtime. 30 tablet  1  . olopatadine (PATANOL) 0.1 % ophthalmic solution Place 1 drop into both eyes 2 (two) times daily. 5 mL 1  . oxyCODONE (OXY IR/ROXICODONE) 5 MG immediate release tablet Take 5 mg by mouth 3 (three) times daily.    . pantoprazole (PROTONIX) 40 MG tablet Take 1 tablet (40 mg total) by mouth daily. 30 tablet 1  . tiZANidine (ZANAFLEX) 4 MG tablet Take 4 mg by mouth 3 (three) times daily.     [name] Kitchen umeclidinium bromide (INCRUSE ELLIPTA) 62.5 MCG/INH AEPB Inhale 1 puff into the lungs daily. 1 each 1   PTA Medications: (Not in a hospital admission)   Musculoskeletal: Strength & Muscle Tone: within normal limits Gait & Station: normal Patient leans: N/A  Psychiatric Specialty Exam: Physical Exam  Nursing note and vitals reviewed. Constitutional: She is oriented to person, place, and time. She appears well-developed and well-nourished.  HENT:  Head: Normocephalic.  Neck: Normal range of motion.  Respiratory: Effort normal.  Musculoskeletal: Normal range of motion.  Neurological: She is alert and oriented to person, place, and time.  Psychiatric: Her speech is normal and behavior is normal. Judgment and thought content normal. Her mood appears anxious. Cognition and memory are normal. She exhibits a depressed mood.    Review of Systems  Psychiatric/Behavioral: Positive for depression. The patient is nervous/anxious.   All other systems reviewed and are negative.   Blood pressure 100/64, pulse 91, temperature 98.3 F (36.8 C), temperature source Oral, resp. rate 16, height 5\' 5"  (1.651 m), weight 80.3 kg, SpO2 94 %.Body mass index is 29.46 kg/m.  General Appearance: Casual  Eye Contact:  Good  Speech:  Normal Rate  Volume:  Normal  Mood:  Anxious and Depressed  Affect:  Congruent  Thought  Process:  Coherent and Descriptions of Associations: Intact  Orientation:  Full (Time, Place, and Person)  Thought Content:  WDL and Logical  Suicidal Thoughts:  No  Homicidal Thoughts:  No  Memory:  Immediate;   Good Recent;   Good Remote;   Good  Judgement:  Fair  Insight:  Fair  Psychomotor Activity:  Normal  Concentration:  Concentration: Good and Attention Span: Good  Recall:  Good  Fund of Knowledge:  Good  Language:  Good  Akathisia:  No  Handed:  Right  AIMS (if indicated):     Assets:  Leisure Time Physical Health Resilience Social Support  ADL's:  Intact  Cognition:  WNL  Sleep:        Demographic Factors:  Caucasian  Loss Factors: NA  Historical Factors: NA  Risk Reduction Factors:   Sense of responsibility to family, Living with another person, especially a relative, Positive social support and Positive therapeutic relationship  Continued Clinical Symptoms:  Depression and anxiety, moderate  Cognitive Features That Contribute To Risk:  None    Suicide Risk:  Minimal: No identifiable suicidal ideation.  Patients presenting with no risk factors but with morbid ruminations; may be classified as minimal risk based on the severity of the depressive symptoms   Plan Of Care/Follow-up recommendations:  Major depressive disorder, recurrent, moderate: -Continued brexpiprazole 3 mg at bedtime -Continued Prozac 60 mg daily  Anxiety: -Continued buspirione 15 mg TID   Insomnia: -Decreased Remeron 30 mg daily to 15 mg for increase effect on sleep  Activity:  as tolerated Diet:  heart healthy diet  Disposition: discharge home , NP 03/15/2019, 5:06 PM

## 2019-03-15 NOTE — BH Assessment (Signed)
Per request of Per Nurse Practitioner Waylan Boga), writer provided the pt. with information and instructions on how to access [employer] (Transcranial Magnetic Stimulation).  Patient denies SI/HI and AV/H.  _______________________________ [facility] Address:  [address],  [place], [place] [address] Phone: [phone]  [place] Address:  [address], [address] [place],  [place], [place] [address]  Idelle Crouch [place] Address: [address]  [place], [place]

## 2019-03-15 NOTE — ED Notes (Signed)
Patient assigned to appropriate care area   Introduced self to pt  Patient oriented to unit/care area: Informed that, for their safety, care areas are designed for safety and visiting and phone hours explained to patient. Patient verbalizes understanding, and verbal contract for safety obtained  Environment secured   Patient arrives from home states "I just started feeling bad again".  States was here in February for same and states feelings returned a couple of weeks ago.  Patient states her medications had been changed but is unable to afford all medications. Denies SI/ HI. Patient says her sister is sort of her support system at home, she lives with her sister.

## 2019-03-20 ENCOUNTER — Inpatient Hospital Stay
Admission: AD | Admit: 2019-03-20 | Discharge: 2019-03-27 | DRG: 885 | Disposition: A | Discharge status: Home / Self Care | Payer: Medicare HMO | Source: Intra-hospital | Attending: Psychiatry | Admitting: Psychiatry

## 2019-03-20 ENCOUNTER — Other Ambulatory Visit: Payer: Self-pay

## 2019-03-20 ENCOUNTER — Emergency Department
Admission: EM | Admit: 2019-03-20 | Discharge: 2019-03-20 | Disposition: A | Discharge status: Other Acute Inpatient Hospital | Payer: Medicare HMO | Attending: Emergency Medicine | Admitting: Emergency Medicine

## 2019-03-20 ENCOUNTER — Encounter: Payer: Self-pay | Admitting: Emergency Medicine

## 2019-03-20 DIAGNOSIS — Z7982 Long term (current) use of aspirin: Secondary | ICD-10-CM | POA: Insufficient documentation

## 2019-03-20 DIAGNOSIS — F332 Major depressive disorder, recurrent severe without psychotic features: Secondary | ICD-10-CM | POA: Diagnosis not present

## 2019-03-20 DIAGNOSIS — Z981 Arthrodesis status: Secondary | ICD-10-CM | POA: Diagnosis not present

## 2019-03-20 DIAGNOSIS — M069 Rheumatoid arthritis, unspecified: Secondary | ICD-10-CM

## 2019-03-20 DIAGNOSIS — Z90710 Acquired absence of both cervix and uterus: Secondary | ICD-10-CM | POA: Diagnosis not present

## 2019-03-20 DIAGNOSIS — F17210 Nicotine dependence, cigarettes, uncomplicated: Secondary | ICD-10-CM | POA: Diagnosis present

## 2019-03-20 DIAGNOSIS — E782 Mixed hyperlipidemia: Secondary | ICD-10-CM | POA: Diagnosis present

## 2019-03-20 DIAGNOSIS — G4700 Insomnia, unspecified: Secondary | ICD-10-CM | POA: Diagnosis present

## 2019-03-20 DIAGNOSIS — M797 Fibromyalgia: Secondary | ICD-10-CM | POA: Diagnosis present

## 2019-03-20 DIAGNOSIS — Z91410 Personal history of adult physical and sexual abuse: Secondary | ICD-10-CM | POA: Diagnosis not present

## 2019-03-20 DIAGNOSIS — Z79899 Other long term (current) drug therapy: Secondary | ICD-10-CM | POA: Insufficient documentation

## 2019-03-20 DIAGNOSIS — F329 Major depressive disorder, single episode, unspecified: Secondary | ICD-10-CM

## 2019-03-20 DIAGNOSIS — J449 Chronic obstructive pulmonary disease, unspecified: Secondary | ICD-10-CM | POA: Diagnosis present

## 2019-03-20 DIAGNOSIS — F419 Anxiety disorder, unspecified: Secondary | ICD-10-CM | POA: Diagnosis not present

## 2019-03-20 DIAGNOSIS — Z791 Long term (current) use of non-steroidal anti-inflammatories (NSAID): Secondary | ICD-10-CM

## 2019-03-20 DIAGNOSIS — Z79891 Long term (current) use of opiate analgesic: Secondary | ICD-10-CM

## 2019-03-20 DIAGNOSIS — I10 Essential (primary) hypertension: Secondary | ICD-10-CM | POA: Diagnosis present

## 2019-03-20 DIAGNOSIS — G8929 Other chronic pain: Secondary | ICD-10-CM | POA: Diagnosis present

## 2019-03-20 DIAGNOSIS — Z9049 Acquired absence of other specified parts of digestive tract: Secondary | ICD-10-CM | POA: Diagnosis not present

## 2019-03-20 DIAGNOSIS — F17290 Nicotine dependence, other tobacco product, uncomplicated: Secondary | ICD-10-CM | POA: Diagnosis present

## 2019-03-20 DIAGNOSIS — Z888 Allergy status to other drugs, medicaments and biological substances status: Secondary | ICD-10-CM | POA: Diagnosis not present

## 2019-03-20 DIAGNOSIS — F4310 Post-traumatic stress disorder, unspecified: Secondary | ICD-10-CM | POA: Diagnosis not present

## 2019-03-20 DIAGNOSIS — Z7951 Long term (current) use of inhaled steroids: Secondary | ICD-10-CM

## 2019-03-20 DIAGNOSIS — F333 Major depressive disorder, recurrent, severe with psychotic symptoms: Secondary | ICD-10-CM | POA: Diagnosis present

## 2019-03-20 DIAGNOSIS — F1021 Alcohol dependence, in remission: Secondary | ICD-10-CM

## 2019-03-20 DIAGNOSIS — F1911 Other psychoactive substance abuse, in remission: Secondary | ICD-10-CM

## 2019-03-20 DIAGNOSIS — K219 Gastro-esophageal reflux disease without esophagitis: Secondary | ICD-10-CM | POA: Diagnosis present

## 2019-03-20 DIAGNOSIS — Z915 Personal history of self-harm: Secondary | ICD-10-CM

## 2019-03-20 DIAGNOSIS — R45851 Suicidal ideations: Secondary | ICD-10-CM | POA: Diagnosis present

## 2019-03-20 DIAGNOSIS — R52 Pain, unspecified: Secondary | ICD-10-CM | POA: Diagnosis not present

## 2019-03-20 DIAGNOSIS — F410 Panic disorder [episodic paroxysmal anxiety] without agoraphobia: Secondary | ICD-10-CM | POA: Diagnosis present

## 2019-03-20 DIAGNOSIS — Z20828 Contact with and (suspected) exposure to other viral communicable diseases: Secondary | ICD-10-CM | POA: Diagnosis not present

## 2019-03-20 DIAGNOSIS — F32A Depression, unspecified: Secondary | ICD-10-CM

## 2019-03-20 DIAGNOSIS — M25519 Pain in unspecified shoulder: Secondary | ICD-10-CM | POA: Diagnosis present

## 2019-03-20 DIAGNOSIS — F1010 Alcohol abuse, uncomplicated: Secondary | ICD-10-CM | POA: Diagnosis present

## 2019-03-20 DIAGNOSIS — Z8379 Family history of other diseases of the digestive system: Secondary | ICD-10-CM | POA: Diagnosis not present

## 2019-03-20 LAB — URINE DRUG SCREEN, QUALITATIVE (ARMC ONLY)
Amphetamines, Ur Screen: NOT DETECTED
Barbiturates, Ur Screen: NOT DETECTED
Benzodiazepine, Ur Scrn: NOT DETECTED
Cannabinoid 50 Ng, Ur ~~LOC~~: NOT DETECTED
Cocaine Metabolite,Ur ~~LOC~~: NOT DETECTED
MDMA (Ecstasy)Ur Screen: NOT DETECTED
Methadone Scn, Ur: NOT DETECTED
Opiate, Ur Screen: NOT DETECTED
Phencyclidine (PCP) Ur S: NOT DETECTED
Tricyclic, Ur Screen: NOT DETECTED

## 2019-03-20 MED ORDER — FLUOXETINE HCL 20 MG PO CAPS: 60.0000 mg | ORAL_CAPSULE | Freq: Every day | ORAL | Status: DC

## 2019-03-20 MED ORDER — DIPHENHYDRAMINE HCL 25 MG PO CAPS: 50.0000 mg | ORAL_CAPSULE | Freq: Four times a day (QID) | ORAL | Status: DC | PRN

## 2019-03-20 MED ORDER — METOPROLOL TARTRATE 25 MG PO TABS
25.0000 mg | ORAL_TABLET | Freq: Two times a day (BID) | ORAL | Status: DC
  Administered 2019-03-21 – 2019-03-27 (×10): 25 mg via ORAL
  Filled 2019-03-20 (×13): qty 1

## 2019-03-20 MED ORDER — UMECLIDINIUM BROMIDE 62.5 MCG/INH IN AEPB
1.0000 | INHALATION_SPRAY | Freq: Every day | RESPIRATORY_TRACT | Status: DC
  Filled 2019-03-20: qty 7

## 2019-03-20 MED ORDER — ASPIRIN EC 81 MG PO TBEC
81.0000 mg | DELAYED_RELEASE_TABLET | Freq: Every day | ORAL | Status: DC
  Administered 2019-03-21 – 2019-03-27 (×7): 81 mg via ORAL
  Filled 2019-03-20 (×7): qty 1

## 2019-03-20 MED ORDER — OLOPATADINE HCL 0.1 % OP SOLN
1.0000 [drp] | Freq: Two times a day (BID) | OPHTHALMIC | Status: DC
  Administered 2019-03-24: 1 [drp] via OPHTHALMIC
  Filled 2019-03-20: qty 5

## 2019-03-20 MED ORDER — METHOTREXATE 2.5 MG PO TABS: 15.0000 mg | ORAL_TABLET | ORAL | Status: DC

## 2019-03-20 MED ORDER — ATORVASTATIN CALCIUM 20 MG PO TABS
40.0000 mg | ORAL_TABLET | Freq: Every evening | ORAL | Status: DC
  Administered 2019-03-21 – 2019-03-26 (×6): 40 mg via ORAL
  Filled 2019-03-20 (×6): qty 2

## 2019-03-20 MED ORDER — OLANZAPINE 5 MG PO TBDP: 10.0000 mg | ORAL_TABLET | Freq: Three times a day (TID) | ORAL | Status: DC | PRN

## 2019-03-20 MED ORDER — PANTOPRAZOLE SODIUM 40 MG PO TBEC
40.0000 mg | DELAYED_RELEASE_TABLET | Freq: Every day | ORAL | Status: DC
  Administered 2019-03-21 – 2019-03-27 (×7): 40 mg via ORAL
  Filled 2019-03-20 (×7): qty 1

## 2019-03-20 MED ORDER — HYDROXYZINE HCL 50 MG PO TABS
50.0000 mg | ORAL_TABLET | Freq: Three times a day (TID) | ORAL | Status: DC | PRN
  Administered 2019-03-21 – 2019-03-26 (×4): 50 mg via ORAL
  Filled 2019-03-20 (×4): qty 1

## 2019-03-20 MED ORDER — BUSPIRONE HCL 5 MG PO TABS: 15.0000 mg | ORAL_TABLET | Freq: Three times a day (TID) | ORAL | Status: DC

## 2019-03-20 MED ORDER — IBUPROFEN 200 MG PO TABS
200.0000 mg | ORAL_TABLET | Freq: Four times a day (QID) | ORAL | Status: DC | PRN
  Administered 2019-03-21 – 2019-03-25 (×5): 200 mg via ORAL
  Filled 2019-03-20 (×6): qty 1

## 2019-03-20 MED ORDER — ATORVASTATIN CALCIUM 20 MG PO TABS: 40.0000 mg | ORAL_TABLET | Freq: Every evening | ORAL | Status: DC

## 2019-03-20 MED ORDER — ACETAMINOPHEN 500 MG PO TABS
1000.0000 mg | ORAL_TABLET | Freq: Once | ORAL | Status: AC
  Administered 2019-03-20: 13:00:00 1000 mg via ORAL

## 2019-03-20 MED ORDER — ISOSORBIDE MONONITRATE ER 60 MG PO TB24: 30.0000 mg | ORAL_TABLET | Freq: Two times a day (BID) | ORAL | Status: DC

## 2019-03-20 MED ORDER — FLUOXETINE HCL 20 MG PO CAPS
60.0000 mg | ORAL_CAPSULE | Freq: Every day | ORAL | Status: DC
  Administered 2019-03-21 – 2019-03-27 (×7): 60 mg via ORAL
  Filled 2019-03-20 (×7): qty 3

## 2019-03-20 MED ORDER — QUETIAPINE FUMARATE 100 MG PO TABS
100.0000 mg | ORAL_TABLET | Freq: Every day | ORAL | Status: DC
  Administered 2019-03-21 – 2019-03-22 (×2): 100 mg via ORAL
  Filled 2019-03-20 (×2): qty 1

## 2019-03-20 MED ORDER — BUSPIRONE HCL 5 MG PO TABS
15.0000 mg | ORAL_TABLET | Freq: Three times a day (TID) | ORAL | Status: DC
  Administered 2019-03-21 – 2019-03-24 (×11): 15 mg via ORAL
  Filled 2019-03-20 (×11): qty 3

## 2019-03-20 MED ORDER — IBUPROFEN 400 MG PO TABS: 200.0000 mg | ORAL_TABLET | Freq: Four times a day (QID) | ORAL | Status: DC | PRN

## 2019-03-20 MED ORDER — QUETIAPINE FUMARATE 25 MG PO TABS: 100.0000 mg | ORAL_TABLET | Freq: Every day | ORAL | Status: DC

## 2019-03-20 MED ORDER — FLUTICASONE FUROATE-VILANTEROL 100-25 MCG/INH IN AEPB
1.0000 | INHALATION_SPRAY | Freq: Every day | RESPIRATORY_TRACT | Status: DC
  Administered 2019-03-22 – 2019-03-27 (×6): 1 via RESPIRATORY_TRACT
  Filled 2019-03-20: qty 28

## 2019-03-20 MED ORDER — ALBUTEROL SULFATE (2.5 MG/3ML) 0.083% IN NEBU: 2.5000 mg | INHALATION_SOLUTION | Freq: Four times a day (QID) | RESPIRATORY_TRACT | Status: DC | PRN

## 2019-03-20 MED ORDER — ACETAMINOPHEN 325 MG PO TABS: 650.0000 mg | ORAL_TABLET | Freq: Four times a day (QID) | ORAL | Status: DC | PRN

## 2019-03-20 MED ORDER — ZIPRASIDONE MESYLATE 20 MG IM SOLR: 20.0000 mg | INTRAMUSCULAR | Status: DC | PRN

## 2019-03-20 MED ORDER — METOPROLOL TARTRATE 25 MG PO TABS: 25.0000 mg | ORAL_TABLET | Freq: Two times a day (BID) | ORAL | Status: DC

## 2019-03-20 MED ORDER — ALUM & MAG HYDROXIDE-SIMETH 200-200-20 MG/5ML PO SUSP: 30.0000 mL | ORAL | Status: DC | PRN

## 2019-03-20 MED ORDER — OLOPATADINE HCL 0.1 % OP SOLN
1.0000 [drp] | Freq: Two times a day (BID) | OPHTHALMIC | Status: DC
  Filled 2019-03-20: qty 5

## 2019-03-20 MED ORDER — FOLIC ACID 1 MG PO TABS: 1.0000 mg | ORAL_TABLET | Freq: Every day | ORAL | Status: DC

## 2019-03-20 MED ORDER — PANTOPRAZOLE SODIUM 40 MG PO TBEC: 40.0000 mg | DELAYED_RELEASE_TABLET | Freq: Every day | ORAL | Status: DC

## 2019-03-20 MED ORDER — ISOSORBIDE MONONITRATE ER 30 MG PO TB24
30.0000 mg | ORAL_TABLET | Freq: Two times a day (BID) | ORAL | Status: DC
  Administered 2019-03-21 – 2019-03-27 (×13): 30 mg via ORAL
  Filled 2019-03-20 (×14): qty 1

## 2019-03-20 MED ORDER — FLUTICASONE FUROATE-VILANTEROL 100-25 MCG/INH IN AEPB
1.0000 | INHALATION_SPRAY | Freq: Every day | RESPIRATORY_TRACT | Status: DC
  Filled 2019-03-20: qty 28

## 2019-03-20 MED ORDER — MAGNESIUM HYDROXIDE 400 MG/5ML PO SUSP: 30.0000 mL | Freq: Every day | ORAL | Status: DC | PRN

## 2019-03-20 MED ORDER — LORAZEPAM 1 MG PO TABS
1.0000 mg | ORAL_TABLET | ORAL | Status: AC | PRN
  Administered 2019-03-20: 1 mg via ORAL
  Filled 2019-03-20: qty 1

## 2019-03-20 MED ORDER — ACETAMINOPHEN 500 MG PO TABS
ORAL_TABLET | ORAL | Status: AC
  Administered 2019-03-20: 1000 mg via ORAL
  Filled 2019-03-20: qty 2

## 2019-03-20 MED ORDER — ASPIRIN EC 81 MG PO TBEC: 81.0000 mg | DELAYED_RELEASE_TABLET | Freq: Every day | ORAL | Status: DC

## 2019-03-20 MED ORDER — UMECLIDINIUM BROMIDE 62.5 MCG/INH IN AEPB
1.0000 | INHALATION_SPRAY | Freq: Every day | RESPIRATORY_TRACT | Status: DC
  Administered 2019-03-21 – 2019-03-27 (×7): 1 via RESPIRATORY_TRACT
  Filled 2019-03-20: qty 7

## 2019-03-20 MED ORDER — FOLIC ACID 1 MG PO TABS
1.0000 mg | ORAL_TABLET | Freq: Every day | ORAL | Status: DC
  Administered 2019-03-21 – 2019-03-27 (×7): 1 mg via ORAL
  Filled 2019-03-20 (×8): qty 1

## 2019-03-20 NOTE — ED Notes (Signed)
BEHAVIORAL HEALTH ROUNDING Patient sleeping: No. Patient alert and oriented: yes Behavior appropriate: Yes.  ; If no, describe:  Nutrition and fluids offered: yes Toileting and hygiene offered: Yes  Sitter present: q15 minute observations and security  monitoring Law enforcement present: Yes  ODS  

## 2019-03-20 NOTE — ED Notes (Signed)
BEHAVIORAL HEALTH ROUNDING Patient sleeping: Yes.   Patient alert and oriented: eyes closed  Appears asleep Behavior appropriate: Yes.  ; If no, describe:  Nutrition and fluids offered: Yes  Toileting and hygiene offered: sleeping Sitter present: q 15 minute observations and security monitoring Law enforcement present: yes  ODS 

## 2019-03-20 NOTE — ED Notes (Signed)
Patient dressed in our scrubs, patient clothes : one pair of black shoes ,pair of socks, one black t-shirt with rainbow on front. One white bra, white pair of panties , one pair of light colored jeans,one black face mask, one  white silver-like necklace with two hearts , one cell phone - pink, one set of keys one small black change nurse, one black like head band, ALL items placed in patient belongs bag. And patient going to room 23.

## 2019-03-20 NOTE — Consult Note (Signed)
[facility] Psych ED Consult  03/20/2019 6:20 PM Teresa Lang  MRN:  [id number] Principal Problem: MDD (major depressive disorder), recurrent severe, without psychosis (HCC) Discharge Diagnoses: Principal Problem:   MDD (major depressive disorder), recurrent severe, without psychosis (HCC) Active Problems:   PTSD (post-traumatic stress disorder)   Hyperlipemia, mixed   Hypertension, essential   Rheumatoid arthritis (HCC)   Chronic generalized pain   Anxiety  Subjective: "I just do not care anymore"  She reports not being able to afford her Rexullit and stopped taking it.  She felt the best she has in a long time on this medication with Prozac, Buspar, and Remeron.    Teresa Lang is a 63 y.o. female with past medical history anxiety, depression, hypertension, hyperlipidemia, PTSD, fibromyalgia, presents to the emergency department for worsening depression.  According to the patient for the past several weeks she has had progressive worsening of her depression, states she has been tearful throughout the day, states she cannot find anything good.  States she attempted drinking alcohol to see if this would help but it did not so she has not been drinking alcohol or using any substances.  Patient has been taking her medications for depression but states they are not working.  Patient denies any active SI or HI although states sometimes she states she just does not care anymore, but would never actively hurt herself.  Patient denies any fever cough congestion or shortness of breath.  No medical complaints.  On evaluation, patient is depressed with flat affect.  She is curled up in bed, hypo-verbal and makes minimal eye contact.  She reports that her depression and anxiety have worsened significantly.  Patient had been admitted to [facility]  in February to inpatient and received ECT, which she thought may have been effective, however she reports having significant memory loss after ECT and was  told she may no longer be a candidate.  Patient is helpless, hopeless, worthless.  She is endorsing suicidal ideation.  She denies HI.  She denies AVH.  Collateral from sister: sister reports that patient has not has not been taking her Rexulti due to not being able to afford this medication. She is not eating, not sleeping well, not getting up out of bed.  Sister has been having to take care of her dog, she relates that patient has a lot a lot of depressive symptoms.  Depression symptoms have worsened since patient is unable to attend her group sessions because of coronavirus pandemic.  Sister relates that she does not know what to do to help her sister, and is worried for her sister's safety.  Total Time spent with patient: 1 hour  Past Psychiatric History: depression, anxiety, PTSD Patient's last psychiatric hospitalization was February 2020. Outpatient providers are Lora Paula as her PCP, and Dr. Georjean Mode for psychiatry.  Patient has not done well with having visits by tele-psychiatry.  She reports that she did have a meeting with her therapist by phone approximately 2 weeks ago.  Past Medical History:  Past Medical History:  Diagnosis Date  . Allergy    seasonal  . Anxiety   . Arthritis   . Depression   . Hyperlipidemia   . Hypertension     Past Surgical History:  Procedure Laterality Date  . ABDOMINAL HYSTERECTOMY  1983  . CHOLECYSTECTOMY  1983  . SPINE SURGERY  beginning 1994   laminectomies, fusions   Family History:  Family History  Problem Relation Age of Onset  . Celiac  disease Sister   . Breast cancer Neg Hx    Family Psychiatric  History: none  Social History:  Patient has been living with her sister, Laverna Peace.  She gives permission to contact her sister for collateral.  Social History   Substance and Sexual Activity  Alcohol Use Never  . Frequency: Never     Social History   Substance and Sexual Activity  Drug Use Yes  . Frequency: 7.0 times per  week  . Types: Marijuana   Comment: "maybe once in the evening"; also uses "KRATUM, the herb, to helps with pain and anxiety"    Social History   Socioeconomic History  . Marital status: Widowed    Spouse name: Not on file  . Number of children: Not on file  . Years of education: Not on file  . Highest education level: Not on file  Occupational History  . Occupation: disability  Social Needs  . Financial resource strain: Not on file  . Food insecurity    Worry: Not on file    Inability: Not on file  . Transportation needs    Medical: Not on file    Non-medical: Not on file  Tobacco Use  . Smoking status: Current Every Day Smoker    Packs/day: 0.25    Years: 57.00    Pack years: 14.25    Types: Cigarettes    Start date: 14  . Smokeless tobacco: Never Used  . Tobacco comment: Also VAPES on a daily basis  Substance and Sexual Activity  . Alcohol use: Never    Frequency: Never  . Drug use: Yes    Frequency: 7.0 times per week    Types: Marijuana    Comment: "maybe once in the evening"; also uses "KRATUM, the herb, to helps with pain and anxiety"  . Sexual activity: Not on file  Lifestyle  . Physical activity    Days per week: Not on file    Minutes per session: Not on file  . Stress: Not on file  Relationships  . Social Musician on phone: Not on file    Gets together: Not on file    Attends religious service: Not on file    Active member of club or organization: Not on file    Attends meetings of clubs or organizations: Not on file    Relationship status: Not on file  Other Topics Concern  . Not on file  Social History Narrative   Lives with sister    Has this patient used any form of tobacco in the last 30 days? (Cigarettes, Smokeless Tobacco, Cigars, and/or Pipes) none  Current Medications: Current Facility-Administered Medications  Medication Dose Route Frequency Provider Last Rate Last Dose  . albuterol (PROVENTIL) (2.5 MG/3ML) 0.083%  nebulizer solution 2.5 mg  2.5 mg Inhalation Q6H PRN Mariel Craft, MD      . aspirin EC tablet 81 mg  81 mg Oral Daily Mariel Craft, MD      . atorvastatin (LIPITOR) tablet 40 mg  40 mg Oral QPM Mariel Craft, MD      . busPIRone (BUSPAR) tablet 15 mg  15 mg Oral TID Mariel Craft, MD      . FLUoxetine (PROZAC) capsule 60 mg  60 mg Oral Daily Mariel Craft, MD      . Melene Muller ON 03/21/2019] fluticasone furoate-vilanterol (BREO ELLIPTA) 100-25 MCG/INH 1 puff  1 puff Inhalation Daily Mariel Craft, MD      . [  START ON 9/37/1696] folic acid (FOLVITE) tablet 1 mg  1 mg Oral Daily Lavella Hammock, MD      . ibuprofen (ADVIL) tablet 200 mg  200 mg Oral Q6H PRN Lavella Hammock, MD      . isosorbide mononitrate (IMDUR) 24 hr tablet 30 mg  30 mg Oral BID Lavella Hammock, MD      . methotrexate [facility]) tablet 15 mg  15 mg Oral Weekly Lavella Hammock, MD      . metoprolol tartrate (LOPRESSOR) tablet 25 mg  25 mg Oral BID Lavella Hammock, MD      . olopatadine (PATANOL) 0.1 % ophthalmic solution 1 drop  1 drop Both Eyes BID Lavella Hammock, MD      . pantoprazole (PROTONIX) EC tablet 40 mg  40 mg Oral Daily Lavella Hammock, MD      . QUEtiapine (SEROQUEL) tablet 100 mg  100 mg Oral QHS Lavella Hammock, MD      . Derrill Memo ON 03/21/2019] umeclidinium bromide (INCRUSE ELLIPTA) 62.5 MCG/INH 1 puff  1 puff Inhalation Daily Lavella Hammock, MD       Current Outpatient Medications  Medication Sig Dispense Refill  . aspirin EC 81 MG tablet Take 81 mg by mouth daily.    [name] Kitchen atorvastatin (LIPITOR) 40 MG tablet Take 1 tablet (40 mg total) by mouth every evening. 30 tablet 1  . busPIRone (BUSPAR) 15 MG tablet Take 1 tablet (15 mg total) by mouth 3 (three) times daily. 90 tablet 1  . clonazePAM (KLONOPIN) 1 MG tablet Take 1 mg by mouth 2 (two) times daily as needed for anxiety.    [name] Kitchen FLUoxetine (PROZAC) 20 MG capsule Take 3 capsules (60 mg total) by mouth daily. 90 capsule 1  . folic acid  (FOLVITE) 1 MG tablet Take 1 mg by mouth daily.    . isosorbide mononitrate (IMDUR) 30 MG 24 hr tablet Take 1 tablet (30 mg total) by mouth 2 (two) times daily. (Patient taking differently: Take 15 mg by mouth 2 (two) times daily. ) 60 tablet 1  . methotrexate (RHEUMATREX) 2.5 MG tablet Take 15 mg by mouth once a week.     . mirtazapine (REMERON) 30 MG tablet Take 0.5 tablets (15 mg total) by mouth at bedtime. 30 tablet 1  . oxyCODONE (OXY IR/ROXICODONE) 5 MG immediate release tablet Take 5 mg by mouth 3 (three) times daily.    [name] Kitchen tiZANidine (ZANAFLEX) 4 MG tablet Take 4 mg by mouth 3 (three) times daily.     . TRELEGY ELLIPTA 100-62.5-25 MCG/INH AEPB Inhale 1 puff into the lungs daily.    [name] Kitchen albuterol (PROVENTIL HFA;VENTOLIN HFA) 108 (90 Base) MCG/ACT inhaler Inhale 2 puffs into the lungs every 6 (six) hours as needed for wheezing or shortness of breath. 1 Inhaler 1  . Brexpiprazole 3 MG TABS Take 3 mg by mouth at bedtime. 30 tablet 1  . diclofenac sodium (VOLTAREN) 1 % GEL Apply 2 g topically 4 (four) times daily as needed (pain).    . fluticasone furoate-vilanterol (BREO ELLIPTA) 100-25 MCG/INH AEPB Inhale 1 puff into the lungs daily. (Patient not taking: Reported on 03/20/2019) 1 each 1  . gabapentin (NEURONTIN) 300 MG capsule Take 300 mg by mouth 3 (three) times daily.    [name] Kitchen ibuprofen (ADVIL,MOTRIN) 200 MG tablet Take 200 mg by mouth every 6 (six) hours as needed for mild pain.     [name] Kitchen lisinopril-hydrochlorothiazide (ZESTORETIC) 20-12.5 MG tablet Take 1  tablet by mouth 2 (two) times a day.    . metoprolol tartrate (LOPRESSOR) 25 MG tablet Take 1 tablet (25 mg total) by mouth 2 (two) times daily. (Patient not taking: Reported on 03/20/2019) 60 tablet 1  . olopatadine (PATANOL) 0.1 % ophthalmic solution Place 1 drop into both eyes 2 (two) times daily. 5 mL 1  . omeprazole (PRILOSEC) 40 MG capsule Take 40 mg by mouth daily.    . pantoprazole (PROTONIX) 40 MG tablet Take 1 tablet (40 mg total) by mouth  daily. 30 tablet 1  . umeclidinium bromide (INCRUSE ELLIPTA) 62.5 MCG/INH AEPB Inhale 1 puff into the lungs daily. (Patient not taking: Reported on 03/20/2019) 1 each 1   PTA Medications: (Not in a hospital admission)   Musculoskeletal: Strength & Muscle Tone: within normal limits Gait & Station: normal Patient leans: N/A  Psychiatric Specialty Exam: Physical Exam  Nursing note and vitals reviewed. Constitutional: She is oriented to person, place, and time. She appears well-developed and well-nourished.  HENT:  Head: Normocephalic.  Neck: Normal range of motion.  Respiratory: Effort normal.  Musculoskeletal: Normal range of motion.  Neurological: She is alert and oriented to person, place, and time.  Psychiatric: Her speech is normal and behavior is normal. Judgment and thought content normal. Her mood appears anxious. Cognition and memory are normal. She exhibits a depressed mood.    Review of Systems  Psychiatric/Behavioral: Positive for depression. The patient is nervous/anxious.   All other systems reviewed and are negative.   Blood pressure (!) 178/92, pulse 83, temperature 98.3 F (36.8 C), temperature source Oral, resp. rate 20, height 5\' 5"  (1.651 m), weight 86.2 kg.Body mass index is 31.62 kg/m.  General Appearance: Disheveled  Eye Contact:  Poor  Speech:  Slow  Volume:  Decreased  Mood:  Anxious, Depressed, Hopeless and Worthless  Affect:  Congruent  Thought Process:  Coherent and Descriptions of Associations: Intact  Orientation:  Full (Time, Place, and Person)  Thought Content:  Logical and Hallucinations: None  Suicidal Thoughts:  Yes.  without intent/plan  Homicidal Thoughts:  No  Memory:  Immediate;   Good Recent;   Good Remote;   Good  Judgement:  Fair  Insight:  Fair  Psychomotor Activity:  Normal  Concentration:  Concentration: Good and Attention Span: Good  Recall:  Good  Fund of Knowledge:  Good  Language:  Good  Akathisia:  No  Handed:  Right   AIMS (if indicated):     Assets:  Leisure Time Physical Health Resilience Social Support  ADL's:  Intact  Cognition:  WNL  Sleep:   Poor    Treatment Plan Summary: Daily contact with patient to assess and evaluate symptoms and progress in treatment, Medication management: Restart BuSpar 15 mg 3 times daily for anxiety Restart Prozac 60 mg daily for depression and anxiety Changed to Seroquel 100 mg daily at bedtime for psychotic and vegetative features associated with depression.  defer further medication management or alternative treatments to primary psychiatric team.  Labs reviewed, will add hemoglobin A1c, lipids, TSH to existing blood work.  Disposition: Recommend psychiatric Inpatient admission when medically cleared. Supportive therapy provided about ongoing stressors. Orders placed for admission.  Mariel CraftSHEILA M MAURER, MD 03/20/2019, 6:20 PM

## 2019-03-20 NOTE — ED Triage Notes (Signed)
Pt crying in triage and having difficulty answering questions.

## 2019-03-20 NOTE — ED Notes (Signed)
Attempted to awaken her for medications  - she is sleeping soundly  VSS  Continue to monitor

## 2019-03-20 NOTE — ED Provider Notes (Signed)
[facility] Emergency Department Provider Note  Time seen: 11:57 AM  I have reviewed the triage vital signs and the nursing notes.   HISTORY  Chief Complaint Suicidal and Depression   HPI Teresa Lang is a 63 y.o. female the past medical history anxiety, depression, hypertension, hyperlipidemia, PTSD, fibromyalgia, presents to the emergency department for worsening depression.  According to the patient for the past several weeks she has had progressive worsening of her depression, states she has been tearful throughout the day, states she cannot find anything good.  States she attempted drinking alcohol to see if this would help but it did not so she has not been drinking alcohol or using any substances.  Patient has been taking her medications for depression but states they are not working.  Patient denies any active SI or HI although states sometimes she states she just does not care anymore, but would never actively hurt herself.  Patient denies any fever cough congestion or shortness of breath.  No medical complaints.  Past Medical History:  Diagnosis Date  . Allergy    seasonal  . Anxiety   . Arthritis   . Depression   . Hyperlipidemia   . Hypertension     Patient Active Problem List   Diagnosis Date Noted  . MDD (major depressive disorder), recurrent severe, without psychosis ([place]) 11/14/2018  . Anxiety 11/14/2018  . Tobacco use disorder 10/16/2018  . Myofascial pain dysfunction syndrome 03/21/2018  . Cervico-occipital neuralgia of right side 03/21/2018  . Cervical post-laminectomy syndrome 02/18/2018  . Chronic upper back pain (Primary Area of Pain) (right) 01/02/2018  . Occipital neuralgia of right side (Secondary Area of Pain) 01/02/2018  . Chronic neck pain (Tertiary Area of Pain)(R) 01/02/2018  . Chronic bilateral low back pain without sciatica (Fourth Area of Pain) 01/02/2018  . Chronic generalized pain 01/02/2018  . Chronic pain  syndrome 01/02/2018  . Long term current use of opiate analgesic 01/02/2018  . Pharmacologic therapy 01/02/2018  . Disorder of skeletal system 01/02/2018  . Problems influencing health status 01/02/2018  . PTSD (post-traumatic stress disorder) 12/14/2017  . Fibromyalgia 12/14/2017  . Hyperlipemia, mixed 12/14/2017  . Hypertension, essential 12/14/2017  . Major depressive disorder, recurrent episode, moderate degree ([place]) 12/14/2017  . Pain of cervical spine 12/14/2017  . Rheumatoid arthritis ([place]) 12/14/2017  . Rheumatoid arthritis involving multiple sites with positive rheumatoid factor ([place]) 12/14/2017    Past Surgical History:  Procedure Laterality Date  . ABDOMINAL HYSTERECTOMY  1983  . CHOLECYSTECTOMY  1983  . SPINE SURGERY  beginning 1994   laminectomies, fusions    Prior to Admission medications   Medication Sig Start Date End Date Taking? Authorizing Provider  albuterol (PROVENTIL HFA;VENTOLIN HFA) 108 (90 Base) MCG/ACT inhaler Inhale 2 puffs into the lungs every 6 (six) hours as needed for wheezing or shortness of breath. 11/20/18   Clapacs, Madie Reno, MD  aspirin EC 81 MG tablet Take 81 mg by mouth daily.    [provider]  atorvastatin (LIPITOR) 40 MG tablet Take 1 tablet (40 mg total) by mouth every evening. 11/20/18   Clapacs, Madie Reno, MD  Brexpiprazole 3 MG TABS Take 3 mg by mouth at bedtime. 03/15/19   Patrecia Pour, NP  busPIRone (BUSPAR) 15 MG tablet Take 1 tablet (15 mg total) by mouth 3 (three) times daily. 03/15/19   Patrecia Pour, NP  diclofenac sodium (VOLTAREN) 1 % GEL Apply 2 g topically 4 (four) times daily as  needed (pain).    [provider]  FLUoxetine (PROZAC) 20 MG capsule Take 3 capsules (60 mg total) by mouth daily. 03/15/19   Charm Rings, NP  fluticasone furoate-vilanterol (BREO ELLIPTA) 100-25 MCG/INH AEPB Inhale 1 puff into the lungs daily. 11/21/18   Clapacs, Jackquline Denmark, MD  folic acid (FOLVITE) 1 MG tablet Take 1 mg by mouth daily.  02/15/18   [provider]  ibuprofen (ADVIL,MOTRIN) 200 MG tablet Take 200 mg by mouth every 6 (six) hours as needed for mild pain.     [provider]  isosorbide mononitrate (IMDUR) 30 MG 24 hr tablet Take 1 tablet (30 mg total) by mouth 2 (two) times daily. 11/20/18   Clapacs, Jackquline Denmark, MD  methotrexate (RHEUMATREX) 2.5 MG tablet Take 15 mg by mouth once a week.  10/16/18   [provider]  metoprolol tartrate (LOPRESSOR) 25 MG tablet Take 1 tablet (25 mg total) by mouth 2 (two) times daily. 11/20/18   Clapacs, Jackquline Denmark, MD  mirtazapine (REMERON) 30 MG tablet Take 0.5 tablets (15 mg total) by mouth at bedtime. 03/15/19   Charm Rings, NP  olopatadine (PATANOL) 0.1 % ophthalmic solution Place 1 drop into both eyes 2 (two) times daily. 11/20/18   Clapacs, Jackquline Denmark, MD  oxyCODONE (OXY IR/ROXICODONE) 5 MG immediate release tablet Take 5 mg by mouth 3 (three) times daily. 11/02/18   [provider]  pantoprazole (PROTONIX) 40 MG tablet Take 1 tablet (40 mg total) by mouth daily. 11/21/18   Clapacs, Jackquline Denmark, MD  tiZANidine (ZANAFLEX) 4 MG tablet Take 4 mg by mouth 3 (three) times daily.     [provider]  umeclidinium bromide (INCRUSE ELLIPTA) 62.5 MCG/INH AEPB Inhale 1 puff into the lungs daily. 11/21/18   Clapacs, Jackquline Denmark, MD    Allergies  Allergen Reactions  . Prednisone Anxiety    Family History  Problem Relation Age of Onset  . Celiac disease Sister   . Breast cancer Neg Hx     Social History Social History   Tobacco Use  . Smoking status: Current Every Day Smoker    Packs/day: 0.25    Years: 57.00    Pack years: 14.25    Types: Cigarettes    Start date: 24  . Smokeless tobacco: Never Used  . Tobacco comment: Also VAPES on a daily basis  Substance Use Topics  . Alcohol use: Never    Frequency: Never  . Drug use: Yes    Frequency: 7.0 times per week    Types: Marijuana    Comment: "maybe once in the evening"; also uses "KRATUM, the herb, to  helps with pain and anxiety"    Review of Systems Constitutional: Negative for fever ENT: Negative for recent illness/congestion Cardiovascular: Negative for chest pain. Respiratory: Negative for shortness of breath. Gastrointestinal: Negative for abdominal pain Musculoskeletal: Negative for musculoskeletal complaints Skin: Negative for skin complaints  Neurological: Negative for headache All other ROS negative  ____________________________________________   PHYSICAL EXAM:  VITAL SIGNS: ED Triage Vitals  Enc Vitals Group     BP 03/20/19 1024 (!) 178/92     Pulse Rate 03/20/19 1024 83     Resp 03/20/19 1024 20     Temp 03/20/19 1025 98.3 F (36.8 C)     Temp Source 03/20/19 1025 Oral     SpO2 --      Weight 03/20/19 1021 190 lb (86.2 kg)     Height 03/20/19 1021 5\' 5"  (  1.651 m)     Head Circumference --      Peak Flow --      Pain Score 03/20/19 1021 10     Pain Loc --      Pain Edu? --      Excl. in GC? --    Constitutional: Alert and oriented.  Tearful. Eyes: Normal exam ENT      Head: Normocephalic and atraumatic.      Mouth/Throat: Mucous membranes are moist. Cardiovascular: Normal rate, regular rhythm.  Respiratory: Normal respiratory effort without tachypnea nor retractions. Breath sounds are clear Gastrointestinal: Soft and nontender. No distention. Musculoskeletal: Nontender with normal range of motion in all extremities.  Neurologic:  Normal speech and language. No gross focal neurologic deficits  Skin:  Skin is warm, dry and intact.  Psychiatric: Tearful with flat affect.  ____________________________________________   INITIAL IMPRESSION / ASSESSMENT AND PLAN / ED COURSE  Pertinent labs & imaging results that were available during my care of the patient were reviewed by me and considered in my medical decision making (see chart for details).   Patient presents to the emergency department for depression, tearful.  No active SI or HI does not currently  meet IVC criteria.  Patient has no medical complaints today.  Reassuring physical exam.  We will check labs have the patient seen by psychiatry.  Teresa Lang was evaluated in Emergency Department on 03/20/2019 for the symptoms described in the history of present illness. She was evaluated in the context of the global COVID-19 pandemic, which necessitated consideration that the patient might be at risk for infection with the SARS-CoV-2 virus that causes COVID-19. Institutional protocols and algorithms that pertain to the evaluation of patients at risk for COVID-19 are in a state of rapid change based on information released by regulatory bodies including the CDC and federal and state organizations. These policies and algorithms were followed during the patient's care in the ED.  ____________________________________________   FINAL CLINICAL IMPRESSION(S) / ED DIAGNOSES  Depression   Minna Antis, MD 03/20/19 1512
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
## 2019-03-20 NOTE — ED Notes (Signed)
Offered patient snack and drink. Pt stated not hungry just wanted ice water.AS

## 2019-03-20 NOTE — ED Notes (Signed)
Patient is to be admitted to [facility] by Dr. Leverne Humbles.  Attending Physician will be Dr. Weber Cooks.   Patient has been assigned to room 319, by [facility] Nurse.   Intake Paper Work has been signed and placed on patient chart.  ER staff is aware of the admission:  ER Secretary    Dr.  ER MD   Amy Patient's Nurse   Sharyn Lull Patient Access.

## 2019-03-20 NOTE — Tx Team (Signed)
Initial Treatment Plan 03/20/2019 11:51 PM [name] DQQ:[id number]    PATIENT STRESSORS: Medication change or noncompliance   PATIENT STRENGTHS: Communication skills General fund of knowledge Motivation for treatment/growth   PATIENT IDENTIFIED PROBLEMS: Depression     Suicidal ideation                  DISCHARGE CRITERIA:  Improved stabilization in mood, thinking, and/or behavior Medical problems require only outpatient monitoring Motivation to continue treatment in a less acute level of care  PRELIMINARY DISCHARGE PLAN: Outpatient therapy  PATIENT/FAMILY INVOLVEMENT: This treatment plan has been presented to and reviewed with the patient, Teresa Lang,The patient and family have been given the opportunity to ask questions and make suggestions.  Harl Bowie, RN 03/20/2019, 11:51 PM

## 2019-03-20 NOTE — ED Triage Notes (Signed)
Pt sent by [facility] for suicidal ideation and depression.

## 2019-03-20 NOTE — ED Notes (Signed)
VOL/To be Admitted to [facility] when bed available

## 2019-03-20 NOTE — ED Notes (Signed)

## 2019-03-21 DIAGNOSIS — F1911 Other psychoactive substance abuse, in remission: Secondary | ICD-10-CM

## 2019-03-21 DIAGNOSIS — F1010 Alcohol abuse, uncomplicated: Secondary | ICD-10-CM

## 2019-03-21 DIAGNOSIS — F1021 Alcohol dependence, in remission: Secondary | ICD-10-CM

## 2019-03-21 DIAGNOSIS — F333 Major depressive disorder, recurrent, severe with psychotic symptoms: Principal | ICD-10-CM

## 2019-03-21 LAB — LIPID PANEL
Cholesterol: 145 mg/dL (ref 0–200)
HDL: 55 mg/dL (ref >40)
LDL Cholesterol: 72 mg/dL (ref 0–99)
Total CHOL/HDL Ratio: 2.6 RATIO
Triglycerides: 90 mg/dL (ref <150)
VLDL: 18 mg/dL (ref 0–40)

## 2019-03-21 LAB — TSH: TSH: 0.366 u[IU]/mL (ref 0.350–4.500)

## 2019-03-21 LAB — COMPREHENSIVE METABOLIC PANEL
ALT: 35 U/L (ref 0–44)
AST: 48 U/L — ABNORMAL HIGH (ref 15–41)
Albumin: 3.5 g/dL (ref 3.5–5.0)
Alkaline Phosphatase: 76 U/L (ref 38–126)
Anion gap: 8 (ref 5–15)
BUN: 11 mg/dL (ref 8–23)
CO2: 28 mmol/L (ref 22–32)
Calcium: 9.5 mg/dL (ref 8.9–10.3)
Chloride: 103 mmol/L (ref 98–111)
Creatinine, Ser: 0.79 mg/dL (ref 0.44–1.00)
GFR calc Af Amer: >60 mL/min (ref >60)
GFR calc non Af Amer: >60 mL/min (ref >60)
Glucose, Bld: 98 mg/dL (ref 70–99)
Potassium: 3.7 mmol/L (ref 3.5–5.1)
Sodium: 139 mmol/L (ref 135–145)
Total Bilirubin: 0.8 mg/dL (ref 0.3–1.2)
Total Protein: 6.8 g/dL (ref 6.5–8.1)

## 2019-03-21 LAB — CBC
HCT: 39.5 % (ref 36.0–46.0)
Hemoglobin: 13.5 g/dL (ref 12.0–15.0)
MCH: 31.5 pg (ref 26.0–34.0)
MCHC: 34.2 g/dL (ref 30.0–36.0)
MCV: 92.1 fL (ref 80.0–100.0)
Platelets: 278 10*3/uL (ref 150–400)
RBC: 4.29 MIL/uL (ref 3.87–5.11)
RDW: 14 % (ref 11.5–15.5)
WBC: 5.9 10*3/uL (ref 4.0–10.5)
nRBC: 0 % (ref 0.0–0.2)

## 2019-03-21 LAB — HEMOGLOBIN A1C
Hgb A1c MFr Bld: 5.5 % (ref 4.8–5.6)
Mean Plasma Glucose: 111.15 mg/dL

## 2019-03-21 LAB — SALICYLATE LEVEL: Salicylate Lvl: <7 mg/dL (ref 2.8–30.0)

## 2019-03-21 LAB — NOVEL CORONAVIRUS, NAA (HOSP ORDER, SEND-OUT TO REF LAB; TAT 18-24 HRS): SARS-CoV-2, NAA: NOT DETECTED

## 2019-03-21 LAB — ACETAMINOPHEN LEVEL: Acetaminophen (Tylenol), Serum: <10 ug/mL — ABNORMAL LOW (ref 10–30)

## 2019-03-21 LAB — ETHANOL: Alcohol, Ethyl (B): <10 mg/dL (ref <10)

## 2019-03-21 MED ORDER — NICOTINE 14 MG/24HR TD PT24
14.0000 mg | MEDICATED_PATCH | Freq: Every day | TRANSDERMAL | Status: DC
  Administered 2019-03-21 – 2019-03-27 (×7): 14 mg via TRANSDERMAL
  Filled 2019-03-21 (×7): qty 1

## 2019-03-21 MED ORDER — LITHIUM CARBONATE ER 300 MG PO TBCR
300.0000 mg | EXTENDED_RELEASE_TABLET | Freq: Every day | ORAL | Status: DC
  Administered 2019-03-21 – 2019-03-22 (×2): 300 mg via ORAL
  Filled 2019-03-21 (×2): qty 1

## 2019-03-21 NOTE — Plan of Care (Signed)
Patient newly admitted, hasn't had time to progress.   Problem: Education: Goal: Knowledge of [place] [facility] Education information/materials will improve Outcome: Not Progressing Goal: Emotional status will improve Outcome: Not Progressing Goal: Mental status will improve Outcome: Not Progressing Goal: Verbalization of understanding the information provided will improve Outcome: Not Progressing   Problem: Safety: Goal: Periods of time without injury will increase Outcome: Not Progressing   Problem: Education: Goal: Utilization of techniques to improve thought processes will improve Outcome: Not Progressing Goal: Knowledge of the prescribed therapeutic regimen will improve Outcome: Not Progressing   
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
## 2019-03-21 NOTE — Progress Notes (Signed)
Recreation Therapy Notes  INPATIENT RECREATION THERAPY ASSESSMENT  Patient Details Name: Teresa Lang MRN: [id number] DOB: 09/06/1956 Today's Date: 03/21/2019       Information Obtained From: Patient  Able to Participate in Assessment/Interview:    Patient Presentation: Responsive, Anxious  Reason for Admission (Per Patient): Active Symptoms, Other (Comments)(Anxiety)  Patient Stressors:    Coping Skills:   TV  Leisure Interests (2+):  Individual - TV  Frequency of Recreation/Participation: Weekly  Awareness of Community Resources:     [employer]:     Current Use:    If no, Barriers?:    Expressed Interest in [employer] Information:    [place] of Residence:  Insurance underwriter  Patient Main Form of Transportation: Musician  Patient Strengths:  N/A  Patient Identified Areas of Improvement:  N/A  Patient Goal for Hospitalization:  Feel better  Current SI (including self-harm):  No  Current HI:  No  Current AVH: No  Staff Intervention Plan: Group Attendance, Collaborate with Interdisciplinary Treatment Team  Consent to Intern Participation: N/A  Teresa Lang 03/21/2019, 11:54 AM
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
## 2019-03-21 NOTE — [facility] Group Notes (Signed)

## 2019-03-21 NOTE — Tx Team (Addendum)
Interdisciplinary Treatment and Diagnostic Plan Update  03/21/2019 Time of Session: 230PM Teresa Lang MRN: [id number]  Principal Diagnosis: <principal problem not specified>  Secondary Diagnoses: Active Problems:   MDD (major depressive disorder), recurrent, severe, with psychosis (HCC)   Current Medications:  Current Facility-Administered Medications  Medication Dose Route Frequency Provider Last Rate Last Dose  . acetaminophen (TYLENOL) tablet 650 mg  650 mg Oral Q6H PRN Mariel CraftMaurer, Sheila M, MD      . albuterol (PROVENTIL) (2.5 MG/3ML) 0.083% nebulizer solution 2.5 mg  2.5 mg Inhalation Q6H PRN Mariel CraftMaurer, Sheila M, MD      . alum & mag hydroxide-simeth (MAALOX/MYLANTA) 200-200-20 MG/5ML suspension 30 mL  30 mL Oral Q4H PRN Mariel CraftMaurer, Sheila M, MD      . aspirin EC tablet 81 mg  81 mg Oral Daily Mariel CraftMaurer, Sheila M, MD   81 mg at 03/21/19 0817  . atorvastatin (LIPITOR) tablet 40 mg  40 mg Oral QPM Mariel CraftMaurer, Sheila M, MD      . busPIRone (BUSPAR) tablet 15 mg  15 mg Oral TID Mariel CraftMaurer, Sheila M, MD   15 mg at 03/21/19 1206  . diphenhydrAMINE (BENADRYL) capsule 50 mg  50 mg Oral Q6H PRN Mariel CraftMaurer, Sheila M, MD      . FLUoxetine (PROZAC) capsule 60 mg  60 mg Oral Daily Mariel CraftMaurer, Sheila M, MD   60 mg at 03/21/19 0817  . fluticasone furoate-vilanterol (BREO ELLIPTA) 100-25 MCG/INH 1 puff  1 puff Inhalation Daily Mariel CraftMaurer, Sheila M, MD      . folic acid (FOLVITE) tablet 1 mg  1 mg Oral Daily Mariel CraftMaurer, Sheila M, MD   1 mg at 03/21/19 0819  . hydrOXYzine (ATARAX/VISTARIL) tablet 50 mg  50 mg Oral TID PRN Mariel CraftMaurer, Sheila M, MD   50 mg at 03/21/19 1451  . ibuprofen (ADVIL) tablet 200 mg  200 mg Oral Q6H PRN Mariel CraftMaurer, Sheila M, MD   200 mg at 03/21/19 1210  . isosorbide mononitrate (IMDUR) 24 hr tablet 30 mg  30 mg Oral BID Mariel CraftMaurer, Sheila M, MD   30 mg at 03/21/19 [id number]  . lithium carbonate (LITHOBID) CR tablet 300 mg  300 mg Oral QHS Clapacs, John T, MD      . magnesium hydroxide (MILK OF MAGNESIA) suspension 30 mL   30 mL Oral Daily PRN Mariel CraftMaurer, Sheila M, MD      . metoprolol tartrate (LOPRESSOR) tablet 25 mg  25 mg Oral BID Mariel CraftMaurer, Sheila M, MD   25 mg at 03/21/19 0817  . nicotine (NICODERM CQ - dosed in mg/24 hours) patch 14 mg  14 mg Transdermal Daily Clapacs, Jackquline DenmarkJohn T, MD   14 mg at 03/21/19 1207  . OLANZapine zydis (ZYPREXA) disintegrating tablet 10 mg  10 mg Oral Q8H PRN Mariel CraftMaurer, Sheila M, MD       And  . ziprasidone (GEODON) injection 20 mg  20 mg Intramuscular PRN Mariel CraftMaurer, Sheila M, MD      . olopatadine (PATANOL) 0.1 % ophthalmic solution 1 drop  1 drop Both Eyes BID Mariel CraftMaurer, Sheila M, MD      . pantoprazole (PROTONIX) EC tablet 40 mg  40 mg Oral Daily Mariel CraftMaurer, Sheila M, MD   40 mg at 03/21/19 0817  . QUEtiapine (SEROQUEL) tablet 100 mg  100 mg Oral QHS Mariel CraftMaurer, Sheila M, MD      . umeclidinium bromide (INCRUSE ELLIPTA) 62.5 MCG/INH 1 puff  1 puff Inhalation Daily Mariel CraftMaurer, Sheila M, MD   1 puff at 03/21/19  0822   PTA Medications: Medications Prior to Admission  Medication Sig Dispense Refill Last Dose  . albuterol (PROVENTIL HFA;VENTOLIN HFA) 108 (90 Base) MCG/ACT inhaler Inhale 2 puffs into the lungs every 6 (six) hours as needed for wheezing or shortness of breath. 1 Inhaler 1   . aspirin EC 81 MG tablet Take 81 mg by mouth daily.     [name] Kitchen atorvastatin (LIPITOR) 40 MG tablet Take 1 tablet (40 mg total) by mouth every evening. 30 tablet 1   . Brexpiprazole 3 MG TABS Take 3 mg by mouth at bedtime. 30 tablet 1   . busPIRone (BUSPAR) 15 MG tablet Take 1 tablet (15 mg total) by mouth 3 (three) times daily. 90 tablet 1   . clonazePAM (KLONOPIN) 1 MG tablet Take 1 mg by mouth 2 (two) times daily as needed for anxiety.     . diclofenac sodium (VOLTAREN) 1 % GEL Apply 2 g topically 4 (four) times daily as needed (pain).     [name] Kitchen FLUoxetine (PROZAC) 20 MG capsule Take 3 capsules (60 mg total) by mouth daily. 90 capsule 1   . fluticasone furoate-vilanterol (BREO ELLIPTA) 100-25 MCG/INH AEPB Inhale 1 puff into the lungs  daily. (Patient not taking: Reported on 03/20/2019) 1 each 1   . folic acid (FOLVITE) 1 MG tablet Take 1 mg by mouth daily.     [name] Kitchen gabapentin (NEURONTIN) 300 MG capsule Take 300 mg by mouth 3 (three) times daily.     [name] Kitchen ibuprofen (ADVIL,MOTRIN) 200 MG tablet Take 200 mg by mouth every 6 (six) hours as needed for mild pain.      . isosorbide mononitrate (IMDUR) 30 MG 24 hr tablet Take 1 tablet (30 mg total) by mouth 2 (two) times daily. (Patient taking differently: Take 15 mg by mouth 2 (two) times daily. ) 60 tablet 1   . lisinopril-hydrochlorothiazide (ZESTORETIC) 20-12.5 MG tablet Take 1 tablet by mouth 2 (two) times a day.     . methotrexate (RHEUMATREX) 2.5 MG tablet Take 15 mg by mouth once a week.      . metoprolol tartrate (LOPRESSOR) 25 MG tablet Take 1 tablet (25 mg total) by mouth 2 (two) times daily. (Patient not taking: Reported on 03/20/2019) 60 tablet 1   . mirtazapine (REMERON) 30 MG tablet Take 0.5 tablets (15 mg total) by mouth at bedtime. 30 tablet 1   . olopatadine (PATANOL) 0.1 % ophthalmic solution Place 1 drop into both eyes 2 (two) times daily. 5 mL 1   . omeprazole (PRILOSEC) 40 MG capsule Take 40 mg by mouth daily.     [name] Kitchen oxyCODONE (OXY IR/ROXICODONE) 5 MG immediate release tablet Take 5 mg by mouth 3 (three) times daily.     . pantoprazole (PROTONIX) 40 MG tablet Take 1 tablet (40 mg total) by mouth daily. 30 tablet 1   . tiZANidine (ZANAFLEX) 4 MG tablet Take 4 mg by mouth 3 (three) times daily.      . TRELEGY ELLIPTA 100-62.5-25 MCG/INH AEPB Inhale 1 puff into the lungs daily.     [name] Kitchen umeclidinium bromide (INCRUSE ELLIPTA) 62.5 MCG/INH AEPB Inhale 1 puff into the lungs daily. (Patient not taking: Reported on 03/20/2019) 1 each 1     Patient Stressors: Medication change or noncompliance  Patient Strengths: Curator fund of knowledge Motivation for treatment/growth  Treatment Modalities: Medication Management, Group therapy, Case management,  1 to 1  session with clinician, Psychoeducation, Recreational therapy.   Physician Treatment Plan for Primary Diagnosis: <  principal problem not specified> Long Term Goal(s):     Short Term Goals:    Medication Management: Evaluate patient's response, side effects, and tolerance of medication regimen.  Therapeutic Interventions: 1 to 1 sessions, Unit Group sessions and Medication administration.  Evaluation of Outcomes: Progressing  Physician Treatment Plan for Secondary Diagnosis: Active Problems:   MDD (major depressive disorder), recurrent, severe, with psychosis (HCC)  Long Term Goal(s):     Short Term Goals:       Medication Management: Evaluate patient's response, side effects, and tolerance of medication regimen.  Therapeutic Interventions: 1 to 1 sessions, Unit Group sessions and Medication administration.  Evaluation of Outcomes: Progressing   RN Treatment Plan for Primary Diagnosis: <principal problem not specified> Long Term Goal(s): Knowledge of disease and therapeutic regimen to maintain health will improve  Short Term Goals: Ability to verbalize feelings will improve and Ability to identify and develop effective coping behaviors will improve  Medication Management: RN will administer medications as ordered by provider, will assess and evaluate patient's response and provide education to patient for prescribed medication. RN will report any adverse and/or side effects to prescribing provider.  Therapeutic Interventions: 1 on 1 counseling sessions, Psychoeducation, Medication administration, Evaluate responses to treatment, Monitor vital signs and CBGs as ordered, Perform/monitor CIWA, COWS, AIMS and Fall Risk screenings as ordered, Perform wound care treatments as ordered.  Evaluation of Outcomes: Progressing   LCSW Treatment Plan for Primary Diagnosis: <principal problem not specified> Long Term Goal(s): Safe transition to appropriate next level of care at discharge,  Engage patient in therapeutic group addressing interpersonal concerns.  Short Term Goals: Engage patient in aftercare planning with referrals and resources, Increase social support, Increase ability to appropriately verbalize feelings, Increase emotional regulation, Facilitate acceptance of mental health diagnosis and concerns and Increase skills for wellness and recovery  Therapeutic Interventions: Assess for all discharge needs, 1 to 1 time with Social worker, Explore available resources and support systems, Assess for adequacy in community support network, Educate family and significant other(s) on suicide prevention, Complete Psychosocial Assessment, Interpersonal group therapy.  Evaluation of Outcomes: Progressing   Progress in Treatment: Attending groups: No. Participating in groups: No. Taking medication as prescribed: Yes. Toleration medication: Yes. Family/Significant other contact made: Yes, individual(s) contacted:  pts sister Patient understands diagnosis: Yes. Discussing patient identified problems/goals with staff: Yes. Medical problems stabilized or resolved: Yes. Denies suicidal/homicidal ideation: Yes. Issues/concerns per patient self-inventory: No. Other: N/A  New problem(s) identified: No, Describe:  none  New Short Term/Long Term Goal(s): medication management for mood stabilization; development of comprehensive mental wellness/sobriety plan.   Patient Goals:  "I want to feel better and be more in control"  Discharge Plan or Barriers: SPE pamphlet, Mobile Crisis information, and AA/NA information provided to patient for additional community support and resources. Pt is agreeable to services at [facility]. CSW assessing for appropriate referrals.  Reason for Continuation of Hospitalization: Anxiety Depression Medication stabilization  Estimated Length of Stay: 5-7 days   Recreational Therapy: Patient Stressors: N/A  Patient Goal: Patient will identify 3 positive  coping skills strategies to use for anxiety post d/c within 5 recreation therapy group sessions  Attendees: Patient: Teresa Lang 03/21/2019 3:21 PM  Physician: Dr Toni Amend MD 03/21/2019 3:21 PM  Nursing: Milas Hock RN 03/21/2019 3:21 PM  RN Care Manager: 03/21/2019 3:21 PM  Social Worker: Olivia Moton LCSW 03/21/2019 3:21 PM  Recreational Therapist: Garret Reddish CTRS LRT 03/21/2019 3:21 PM  Other: Penni Homans LCSW 03/21/2019 3:21 PM  Other:  03/21/2019 3:21 PM  Other: 03/21/2019 3:21 PM    Scribe for Treatment Team: Charlann Lange Moton, LCSW 03/21/2019 3:21 PM

## 2019-03-21 NOTE — [facility] Suicide Risk Assessment (Signed)
[place] INPATIENT:  Family/Significant Other Suicide Prevention Education  Suicide Prevention Education:  Education Completed; Teresa Lang, sister, [phone] has been identified by the patient as the family member/significant other with whom the patient will be residing, and identified as the person(s) who will aid the patient in the event of a mental health crisis (suicidal ideations/suicide attempt).  With written consent from the patient, the family member/significant other has been provided the following suicide prevention education, prior to the and/or following the discharge of the patient.  The suicide prevention education provided includes the following:  Suicide risk factors  Suicide prevention and interventions  National Suicide Hotline telephone number  [facility] assessment telephone number  [facility] Emergency Assistance [place] and/or Residential Mobile Crisis Unit telephone number  Request made of family/significant other to:  Remove weapons (e.g., guns, rifles, knives), all items previously/currently identified as safety concern.    Remove drugs/medications (over-the-counter, prescriptions, illicit drugs), all items previously/currently identified as a safety concern.  The family member/significant other verbalizes understanding of the suicide prevention education information provided.  The family member/significant other agrees to remove the items of safety concern listed above.  Sister reports "she told me that she is so depressed, she just stays in her room.  She just seems to always to be in her room and crying and I can't handle all that crying."  Sister reports that patient has not made comments to harm self or others.  Pt reports "I don't know much about depression.  Sometimes our mother would do things to get attention.  I don't know. There is nothing that I can do to help her."  CSW suggested that sister go with the patient to an  appointment or do some research into depression. Sister hypothesizes that the patient is "like this because of all of this [Covid-19]".  Sister reports "she hasn't been able to go anywhere or do her groups or see her new grandbaby."    Teresa Lang 03/21/2019, 10:28 AM

## 2019-03-21 NOTE — Plan of Care (Addendum)
Pt rates depression and anxiety 10/10. Pt denies SI, HI and AVH. Pt was educated on care plan and verbalizes understanding. Collier Bullock RN Problem: Education: Goal: Freight forwarder Education information/materials will improve Outcome: Progressing Goal: Emotional status will improve Outcome: Not Progressing Goal: Mental status will improve Outcome: Not Progressing Goal: Verbalization of understanding the information provided will improve Outcome: Progressing   Problem: Safety: Goal: Periods of time without injury will increase Outcome: Progressing   Problem: Education: Goal: Knowledge of [place] [facility] Education information/materials will improve Outcome: Progressing Goal: Emotional status will improve Outcome: Not Progressing Goal: Mental status will improve Outcome: Not Progressing Goal: Verbalization of understanding the information provided will improve Outcome: Progressing   Problem: Activity: Goal: Interest or engagement in activities will improve Outcome: Not Progressing Goal: Sleeping patterns will improve Outcome: Progressing   Problem: Coping: Goal: Ability to verbalize frustrations and anger appropriately will improve Outcome: Progressing   Problem: Health Behavior/Discharge Planning: Goal: Identification of resources available to assist in meeting health care needs will improve Outcome: Progressing Goal: Compliance with treatment plan for underlying cause of condition will improve Outcome: Progressing   Problem: Physical Regulation: Goal: Ability to maintain clinical measurements within normal limits will improve Outcome: Progressing   Problem: Safety: Goal: Periods of time without injury will increase Outcome: Progressing   Problem: Education: Goal: Utilization of techniques to improve thought processes will improve Outcome: Not Progressing Goal: Knowledge of the prescribed therapeutic regimen will improve Outcome: Progressing    Problem: Safety: Goal: Ability to disclose and discuss suicidal ideas will improve Outcome: Progressing Goal: Ability to identify and utilize support systems that promote safety will improve Outcome: Progressing   Problem: Activity: Goal: Interest or engagement in leisure activities will improve Outcome: Not Progressing Goal: Imbalance in normal sleep/wake cycle will improve Outcome: Not Progressing   Problem: Coping: Goal: Coping ability will improve Outcome: Progressing Goal: Will verbalize feelings Outcome: Progressing   Problem: Health Behavior/Discharge Planning: Goal: Ability to make decisions will improve Outcome: Progressing Goal: Compliance with therapeutic regimen will improve Outcome: Progressing   Problem: Role Relationship: Goal: Will demonstrate positive changes in social behaviors and relationships Outcome: Progressing   Problem: Self-Concept: Goal: Will verbalize positive feelings about self Outcome: Not Progressing Goal: Level of anxiety will decrease Outcome: Not Progressing   Problem: Education: Goal: Utilization of techniques to improve thought processes will improve Outcome: Progressing Goal: Knowledge of the prescribed therapeutic regimen will improve Outcome: Progressing   Problem: Activity: Goal: Interest or engagement in leisure activities will improve Outcome: Not Progressing Goal: Imbalance in normal sleep/wake cycle will improve Outcome: Not Progressing   Problem: Coping: Goal: Coping ability will improve Outcome: Progressing Goal: Will verbalize feelings Outcome: Progressing   Problem: Health Behavior/Discharge Planning: Goal: Ability to make decisions will improve Outcome: Progressing Goal: Compliance with therapeutic regimen will improve Outcome: Progressing

## 2019-03-21 NOTE — Progress Notes (Signed)
Recreation Therapy Notes   Date: 03/21/2019  Time: 9:30 am   Location: Craft room   Behavioral response: N/A   Intervention Topic: Problem Solving  Discussion/Intervention: Patient did not attend group.   Clinical Observations/Feedback:  Patient did not attend group.   Loriann Bosserman LRT/CTRS         Ysabella Babiarz 03/21/2019 11:01 AM

## 2019-03-21 NOTE — Progress Notes (Signed)
D - Patient was admitted from [facility] Emergency Department. Patient was pleasant upon arrival to the unit. Patient said she has had SI in the past few days but was feeling better now that she is down here on the unit. Patient said she was familiar with the unit from previous admission. Patient denies pain but endorses anxiety and depression. Patient said her depression has been worsening the last few weeks. Patient stated her goal is to get her medications right so that she feels better.   A - Patient compliant with medication administration per MD orders and procedures on the unit. Patient given education. Patient given support and encouragement to be active in her treatment plan. Patient informed to let staff know if there are any issues or problems on the unit.   R - Patient being monitored Q 15 minutes for safety per unit protocol. Patient remains safe on the unit.
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
## 2019-03-21 NOTE — [facility] Suicide Risk Assessment (Signed)
[facility] Admission Suicide Risk Assessment   Nursing information obtained from:  Patient Demographic factors:  Caucasian, Living alone, Unemployed Current Mental Status:  NA Loss Factors:  Decrease in vocational status, Decline in physical health Historical Factors:  Impulsivity Risk Reduction Factors:  Positive therapeutic relationship  Total Time spent with patient: 1 hour Principal Problem: <principal problem not specified> Diagnosis:  Active Problems:   MDD (major depressive disorder), recurrent, severe, with psychosis ([place])  Subjective Data: Patient seen and chart reviewed.  Patient with a history of severe recurrent depression comes to the hospital voluntarily with depressed mood and suicidal ideation without specific intent or plan.  No psychotic symptoms.  Open and agreeable to treatment planning and treatment  Continued Clinical Symptoms:  Alcohol Use Disorder Identification Test Final Score (AUDIT): 0 The "Alcohol Use Disorders Identification Test", Guidelines for Use in Primary Care, Second Edition.  World Pharmacologist [facility]). Score between 0-7:  no or low risk or alcohol related problems. Score between 8-15:  moderate risk of alcohol related problems. Score between 16-19:  high risk of alcohol related problems. Score 20 or above:  warrants further diagnostic evaluation for alcohol dependence and treatment.   CLINICAL FACTORS:   Depression:   Anhedonia Alcohol/Substance Abuse/Dependencies   Musculoskeletal: Strength & Muscle Tone: within normal limits Gait & Station: normal Patient leans: N/A  Psychiatric Specialty Exam: Physical Exam  Nursing note and vitals reviewed. Constitutional: She appears well-developed and well-nourished.  HENT:  Head: Normocephalic and atraumatic.  Eyes: Pupils are equal, round, and reactive to light. Conjunctivae are normal.  Neck: Normal range of motion.  Cardiovascular: Regular rhythm and normal heart sounds.  Respiratory: Effort  normal. No respiratory distress.  GI: Soft.  Musculoskeletal: Normal range of motion.  Neurological: She is alert.  Skin: Skin is warm and dry.  Psychiatric: Her speech is normal and behavior is normal. Her affect is not blunt. Cognition and memory are normal. She expresses impulsivity. She exhibits a depressed mood. She expresses suicidal ideation. She expresses no suicidal plans.    Review of Systems  Constitutional: Negative.   HENT: Negative.   Eyes: Negative.   Respiratory: Negative.   Cardiovascular: Negative.   Gastrointestinal: Negative.   Musculoskeletal: Negative.   Skin: Negative.   Neurological: Negative.   Psychiatric/Behavioral: Positive for depression, memory loss, substance abuse and suicidal ideas. Negative for hallucinations. The patient is nervous/anxious and has insomnia.     Blood pressure (!) 154/88, pulse 85, temperature 98.7 F (37.1 C), temperature source Oral, resp. rate 18, height 5\' 5"  (1.651 m), weight 86.2 kg, SpO2 94 %.Body mass index is 31.62 kg/m.  General Appearance: Casual  Eye Contact:  Fair  Speech:  Slow  Volume:  Decreased  Mood:  Anxious  Affect:  Depressed  Thought Process:  Goal Directed  Orientation:  Full (Time, Place, and Person)  Thought Content:  Logical  Suicidal Thoughts:  Yes.  without intent/plan  Homicidal Thoughts:  No  Memory:  Immediate;   Fair Recent;   Fair Remote;   Fair  Judgement:  Fair  Insight:  Fair  Psychomotor Activity:  Decreased  Concentration:  Concentration: Fair  Recall:  [employer] of Knowledge:  Fair  Language:  Fair  Akathisia:  No  Handed:  Right  AIMS (if indicated):     Assets:  Desire for Improvement Housing Physical Health  ADL's:  Intact  Cognition:  WNL  Sleep:  Number of Hours: 5      COGNITIVE FEATURES THAT  CONTRIBUTE TO RISK:  Polarized thinking    SUICIDE RISK:   Moderate:  Frequent suicidal ideation with limited intensity, and duration, some specificity in terms of plans,  no associated intent, good self-control, limited dysphoria/symptomatology, some risk factors present, and identifiable protective factors, including available and accessible social support.  PLAN OF CARE: Patient will be kept on 15-minute precautions.  Restarting medication to treat depression.  Engage in individual and group therapy.  Reassess suicidality prior to discharge  I certify that inpatient services furnished can reasonably be expected to improve the patient's condition.   Mordecai Rasmussen, MD 03/21/2019, 5:43 PM
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
## 2019-03-22 MED ORDER — CLONAZEPAM 1 MG PO TABS
1.0000 mg | ORAL_TABLET | Freq: Two times a day (BID) | ORAL | Status: DC | PRN
  Administered 2019-03-22 – 2019-03-26 (×4): 1 mg via ORAL
  Filled 2019-03-22 (×4): qty 1

## 2019-03-22 MED ORDER — OXYCODONE HCL 5 MG PO TABS
5.0000 mg | ORAL_TABLET | Freq: Four times a day (QID) | ORAL | Status: DC | PRN
  Administered 2019-03-22 – 2019-03-27 (×12): 5 mg via ORAL
  Filled 2019-03-22 (×12): qty 1

## 2019-03-22 NOTE — [facility] Group Notes (Signed)
LCSW Group Therapy Note   03/22/2019 1:15pm   Type of Therapy and Topic:  Group Therapy:  Trust and Honesty  Participation Level:  Did Not Attend  Description of Group:    In this group patients will be asked to explore the value of being honest.  Patients will be guided to discuss their thoughts, feelings, and behaviors related to honesty and trusting in others. Patients will process together how trust and honesty relate to forming relationships with peers, family members, and self. Each patient will be challenged to identify and express feelings of being vulnerable. Patients will discuss reasons why people are dishonest and identify alternative outcomes if one was truthful (to self or others). This group will be process-oriented, with patients participating in exploration of their own experiences, giving and receiving support, and processing challenge from other group members.   Therapeutic Goals: 1. Patient will identify why honesty is important to relationships and how honesty overall affects relationships.  2. Patient will identify a situation where they lied or were lied too and the  feelings, thought process, and behaviors surrounding the situation 3. Patient will identify the meaning of being vulnerable, how that feels, and how that correlates to being honest with self and others. 4. Patient will identify situations where they could have told the truth, but instead lied and explain reasons of dishonesty.   Summary of Patient Progress Pt was invited to attend group but chose not to attend. CSW will continue to encourage pt to attend group throughout their admission.     Therapeutic Modalities:   Cognitive Behavioral Therapy Solution Focused Therapy Motivational Interviewing Brief Therapy  Imagene Boss  CUEBAS-COLON, LCSW 03/22/2019 4:00 PM

## 2019-03-22 NOTE — Plan of Care (Signed)
Patient is alert and oriented x 4. Patient presents with sad, flat facial expression. Endorses some anxiety, vistaril administered with semi-effective results. Due to the fact that patient's anxiety could not be not controlled with her scheduled Buspar, a new order was placed for Klonopin. Patient remains isolative to her room for the majority of the shift. Compliant with treatment plan, milieu remains safe with q 15 minute safety checks. Will continue to monitor.

## 2019-03-22 NOTE — Progress Notes (Signed)
[facility]  03/22/2019 9:16 AM Teresa Lang  MRN:  [id number] Subjective: Patient is a 63 year old female with a past psychiatric history significant for major depression, posttraumatic stress disorder, fibromyalgia, hypertension and alcohol abuse who was admitted on 03/21/2019 with worsening depression.  Objective: Patient is seen and examined.  Patient is a 63 year old female with the above-stated past psychiatric history is seen in follow-up.  She stated she is not doing well today.  She stated her pain is extremely high.  She stated that she is supposed to be getting oxycodone for pain.  She has Tylenol and ibuprofen available for her currently.  She stated that she does not necessarily to say that she is suicidal, but she does state that she "I be very happy if I was not around".  She stated she had previously had ECT and was successful.  She continues currently on BuSpar, fluoxetine, lithium carbonate, Seroquel.  Review of Dr. Toni Amendlapacs notes reveal that she had also been on Rexulti, but was unable to afford that.  She had also been treated with Abilify of no benefit.  It was noted that she had never been on lithium before, and that was added on admission.  Her focus this morning is her pain.  Review of the [place] controlled substance database reveals that she had been given oxycodone 5 mg 90 tablets on 03/12/2019 it does appear that she has been getting those on a regular basis from a physician in [place].  Her blood pressure is a bit up today at 141/96, pulse is 74, she is afebrile.  She slept 7.15 hours last night.  Principal Problem: MDD (major depressive disorder), recurrent, severe, with psychosis (HCC) Diagnosis: Principal Problem:   MDD (major depressive disorder), recurrent, severe, with psychosis (HCC) Active Problems:   PTSD (post-traumatic stress disorder)   Fibromyalgia   Hyperlipemia, mixed   Hypertension, essential   Alcohol abuse  Total Time spent with  patient: 15 minutes  Past Psychiatric History: See admission H&P  Past Medical History:  Past Medical History:  Diagnosis Date  . Allergy    seasonal  . Anxiety   . Arthritis   . Depression   . Hyperlipidemia   . Hypertension     Past Surgical History:  Procedure Laterality Date  . ABDOMINAL HYSTERECTOMY  1983  . CHOLECYSTECTOMY  1983  . SPINE SURGERY  beginning 1994   laminectomies, fusions   Family History:  Family History  Problem Relation Age of Onset  . Celiac disease Sister   . Breast cancer Neg Hx    Family Psychiatric  History: See admission H&P Social History:  Social History   Substance and Sexual Activity  Alcohol Use Never  . Frequency: Never     Social History   Substance and Sexual Activity  Drug Use Yes  . Frequency: 7.0 times per week  . Types: Marijuana   Comment: "maybe once in the evening"; also uses "KRATUM, the herb, to helps with pain and anxiety"    Social History   Socioeconomic History  . Marital status: Widowed    Spouse name: Not on file  . Number of children: Not on file  . Years of education: Not on file  . Highest education level: Not on file  Occupational History  . Occupation: disability  Social Needs  . Financial resource strain: Not on file  . Food insecurity    Worry: Not on file    Inability: Not on file  . Transportation  needs    Medical: Not on file    Non-medical: Not on file  Tobacco Use  . Smoking status: Current Every Day Smoker    Packs/day: 0.25    Years: 57.00    Pack years: 14.25    Types: Cigarettes    Start date: 64  . Smokeless tobacco: Never Used  . Tobacco comment: Also VAPES on a daily basis  Substance and Sexual Activity  . Alcohol use: Never    Frequency: Never  . Drug use: Yes    Frequency: 7.0 times per week    Types: Marijuana    Comment: "maybe once in the evening"; also uses "KRATUM, the herb, to helps with pain and anxiety"  . Sexual activity: Not on file  Lifestyle  .  Physical activity    Days per week: Not on file    Minutes per session: Not on file  . Stress: Not on file  Relationships  . Social Musician on phone: Not on file    Gets together: Not on file    Attends religious service: Not on file    Active member of club or organization: Not on file    Attends meetings of clubs or organizations: Not on file    Relationship status: Not on file  Other Topics Concern  . Not on file  Social History Narrative   Lives with sister   Additional Social History:                         Sleep: Fair  Appetite:  Fair  Current Medications: Current Facility-Administered Medications  Medication Dose Route Frequency Provider Last Rate Last Dose  . acetaminophen (TYLENOL) tablet 650 mg  650 mg Oral Q6H PRN Mariel Craft, MD      . albuterol (PROVENTIL) (2.5 MG/3ML) 0.083% nebulizer solution 2.5 mg  2.5 mg Inhalation Q6H PRN Mariel Craft, MD      . alum & mag hydroxide-simeth (MAALOX/MYLANTA) 200-200-20 MG/5ML suspension 30 mL  30 mL Oral Q4H PRN Mariel Craft, MD      . aspirin EC tablet 81 mg  81 mg Oral Daily Mariel Craft, MD   81 mg at 03/22/19 3086  . atorvastatin (LIPITOR) tablet 40 mg  40 mg Oral QPM Mariel Craft, MD   40 mg at 03/21/19 1705  . busPIRone (BUSPAR) tablet 15 mg  15 mg Oral TID Mariel Craft, MD   15 mg at 03/22/19 5784  . diphenhydrAMINE (BENADRYL) capsule 50 mg  50 mg Oral Q6H PRN Mariel Craft, MD      . FLUoxetine (PROZAC) capsule 60 mg  60 mg Oral Daily Mariel Craft, MD   60 mg at 03/22/19 6962  . fluticasone furoate-vilanterol (BREO ELLIPTA) 100-25 MCG/INH 1 puff  1 puff Inhalation Daily Mariel Craft, MD   1 puff at 03/22/19 0813  . folic acid (FOLVITE) tablet 1 mg  1 mg Oral Daily Mariel Craft, MD   1 mg at 03/22/19 9528  . hydrOXYzine (ATARAX/VISTARIL) tablet 50 mg  50 mg Oral TID PRN Mariel Craft, MD   50 mg at 03/22/19 4132  . ibuprofen (ADVIL) tablet 200 mg  200 mg  Oral Q6H PRN Mariel Craft, MD   200 mg at 03/21/19 2119  . isosorbide mononitrate (IMDUR) 24 hr tablet 30 mg  30 mg Oral BID Mariel Craft, MD   30 mg  at 03/22/19 0812  . lithium carbonate (LITHOBID) CR tablet 300 mg  300 mg Oral QHS Clapacs, John T, MD   300 mg at 03/21/19 2119  . magnesium hydroxide (MILK OF MAGNESIA) suspension 30 mL  30 mL Oral Daily PRN Mariel CraftMaurer, Sheila M, MD      . metoprolol tartrate (LOPRESSOR) tablet 25 mg  25 mg Oral BID Mariel CraftMaurer, Sheila M, MD   25 mg at 03/22/19 16100812  . nicotine (NICODERM CQ - dosed in mg/24 hours) patch 14 mg  14 mg Transdermal Daily Clapacs, Jackquline DenmarkJohn T, MD   14 mg at 03/22/19 0813  . OLANZapine zydis (ZYPREXA) disintegrating tablet 10 mg  10 mg Oral Q8H PRN Mariel CraftMaurer, Sheila M, MD       And  . ziprasidone (GEODON) injection 20 mg  20 mg Intramuscular PRN Mariel CraftMaurer, Sheila M, MD      . olopatadine (PATANOL) 0.1 % ophthalmic solution 1 drop  1 drop Both Eyes BID Mariel CraftMaurer, Sheila M, MD      . pantoprazole (PROTONIX) EC tablet 40 mg  40 mg Oral Daily Mariel CraftMaurer, Sheila M, MD   40 mg at 03/22/19 0814  . QUEtiapine (SEROQUEL) tablet 100 mg  100 mg Oral QHS Mariel CraftMaurer, Sheila M, MD   100 mg at 03/21/19 2119  . umeclidinium bromide (INCRUSE ELLIPTA) 62.5 MCG/INH 1 puff  1 puff Inhalation Daily Mariel CraftMaurer, Sheila M, MD   1 puff at 03/22/19 [id number]    Lab Results:  Results for orders placed or performed during the hospital encounter of 03/20/19 (from the past 48 hour(s))  Acetaminophen level     Status: Abnormal   Collection Time: 03/21/19 10:27 AM  Result Value Ref Range   Acetaminophen (Tylenol), Serum <10 (L) 10 - 30 ug/mL    Comment: (NOTE) Therapeutic concentrations vary significantly. A range of 10-30 ug/mL  may be an effective concentration for many patients. However, some  are best treated at concentrations outside of this range. Acetaminophen concentrations >150 ug/mL at 4 hours after ingestion  and >50 ug/mL at 12 hours after ingestion are often associated with   toxic reactions. Performed at [facility], [address] [place], [place] [address]   CBC     Status: None   Collection Time: 03/21/19 10:27 AM  Result Value Ref Range   WBC 5.9 4.0 - 10.5 K/uL   RBC 4.29 3.87 - 5.11 MIL/uL   Hemoglobin 13.5 12.0 - 15.0 g/dL   HCT 11.939.5 14.736.0 - 82.946.0 %   MCV 92.1 80.0 - 100.0 fL   MCH 31.5 26.0 - 34.0 pg   MCHC 34.2 30.0 - 36.0 g/dL   RDW 56.214.0 13.011.5 - 86.515.5 %   Platelets 278 150 - 400 K/uL   nRBC 0.0 0.0 - 0.2 %    Comment: Performed at [facility], [address] [place], [place] [address]  Comprehensive metabolic panel     Status: Abnormal   Collection Time: 03/21/19 10:27 AM  Result Value Ref Range   Sodium 139 135 - 145 mmol/L   Potassium 3.7 3.5 - 5.1 mmol/L   Chloride 103 98 - 111 mmol/L   CO2 28 22 - 32 mmol/L   Glucose, Bld 98 70 - 99 mg/dL   BUN 11 8 - 23 mg/dL   Creatinine, Ser 6.290.79 0.44 - 1.00 mg/dL   Calcium 9.5 8.9 - 52.810.3 mg/dL   Total Protein 6.8 6.5 - 8.1 g/dL   Albumin 3.5 3.5 - 5.0 g/dL  AST 48 (H) 15 - 41 U/L   ALT 35 0 - 44 U/L   Alkaline Phosphatase 76 38 - 126 U/L   Total Bilirubin 0.8 0.3 - 1.2 mg/dL   GFR calc non Af Amer >60 >60 mL/min   GFR calc Af Amer >60 >60 mL/min   Anion gap 8 5 - 15    Comment: Performed at [facility], [address] [place], [place] [address]  Ethanol     Status: None   Collection Time: 03/21/19 10:27 AM  Result Value Ref Range   Alcohol, Ethyl (B) <10 <10 mg/dL    Comment: (NOTE) Lowest detectable limit for serum alcohol is 10 mg/dL. For medical purposes only. Performed at [facility], [address] [place], [place] [address]   Hemoglobin A1c     Status: None   Collection Time: 03/21/19 10:27 AM  Result Value Ref Range   Hgb A1c MFr Bld 5.5 4.8 - 5.6 %    Comment: (NOTE) Pre diabetes:          5.7%-6.4% Diabetes:              >6.4% Glycemic control for   <7.0% adults with diabetes    Mean Plasma Glucose 111.15 mg/dL     Comment: Performed at [facility], [address] [place], [place] [address]  Lipid panel     Status: None   Collection Time: 03/21/19 10:27 AM  Result Value Ref Range   Cholesterol 145 0 - 200 mg/dL   Triglycerides 90 <630 mg/dL   HDL 55 >16 mg/dL   Total CHOL/HDL Ratio 2.6 RATIO   VLDL 18 0 - 40 mg/dL   LDL Cholesterol 72 0 - 99 mg/dL    Comment:        Total Cholesterol/HDL:CHD Risk Coronary Heart Disease Risk Table                     Men   Women  1/2 Average Risk   3.4   3.3  Average Risk       5.0   4.4  2 X Average Risk   9.6   7.1  3 X Average Risk  23.4   11.0        Use the calculated Patient Ratio above and the CHD Risk Table to determine the patient's CHD Risk.        ATP III CLASSIFICATION (LDL):  <100     mg/dL   Optimal  010-932  mg/dL   Near or Above                    Optimal  130-159  mg/dL   Borderline  355-732  mg/dL   High  >202     mg/dL   Very High Performed at [facility], [address] [place], [place] [address]   Salicylate level     Status: None   Collection Time: 03/21/19 10:27 AM  Result Value Ref Range   Salicylate Lvl <7.0 2.8 - 30.0 mg/dL    Comment: Performed at [facility], [address] [place], [place] [address]  TSH     Status: None   Collection Time: 03/21/19 10:27 AM  Result Value Ref Range   TSH 0.366 0.350 - 4.500 uIU/mL    Comment: Performed by a 3rd Generation assay with a functional sensitivity of <=0.01 uIU/mL. Performed at [facility], [address], [place], [place]  [address]     Blood Alcohol level:  Lab Results  Component Value Date   ETH <10 03/21/2019   ETH <10 16/07/9603    Metabolic Disorder Labs: Lab Results  Component Value Date   HGBA1C 5.5 03/21/2019   MPG 111.15 03/21/2019   No results found for: PROLACTIN Lab Results  Component Value Date   CHOL 145 03/21/2019   TRIG 90 03/21/2019   HDL 55 03/21/2019   CHOLHDL 2.6 03/21/2019   VLDL 18 03/21/2019    LDLCALC 72 03/21/2019    Physical Findings: AIMS: Facial and Oral Movements Muscles of Facial Expression: None, normal Lips and Perioral Area: None, normal Jaw: None, normal Tongue: None, normal,Extremity Movements Upper (arms, wrists, hands, fingers): None, normal Lower (legs, knees, ankles, toes): None, normal, Trunk Movements Neck, shoulders, hips: None, normal, Overall Severity Severity of abnormal movements (highest score from questions above): None, normal Incapacitation due to abnormal movements: None, normal Patient's awareness of abnormal movements (rate only patient's report): No Awareness, Dental Status Current problems with teeth and/or dentures?: No Does patient usually wear dentures?: No  CIWA:  CIWA-Ar Total: 4 COWS:  COWS Total Score: 1  Musculoskeletal: Strength & Muscle Tone: within normal limits Gait & Station: shuffle Patient leans: N/A  Psychiatric Specialty Exam: Physical Exam  Nursing note and vitals reviewed. Constitutional: She is oriented to person, place, and time. She appears well-developed and well-nourished.  HENT:  Head: Normocephalic and atraumatic.  Respiratory: Effort normal.  Neurological: She is alert and oriented to person, place, and time.    ROS  Blood pressure (!) 141/96, pulse 74, temperature 98.7 F (37.1 C), temperature source Oral, resp. rate 17, height 5\' 5"  (1.651 m), weight 86.2 kg, SpO2 95 %.Body mass index is 31.62 kg/m.  General Appearance: Casual  Eye Contact:  Fair  Speech:  Normal Rate  Volume:  Normal  Mood:  Depressed  Affect:  Congruent  Thought Process:  Coherent and Descriptions of Associations: Intact  Orientation:  Full (Time, Place, and Person)  Thought Content:  Logical  Suicidal Thoughts:  No  Homicidal Thoughts:  No  Memory:  Immediate;   Fair Recent;   Fair Remote;   Fair  Judgement:  Intact  Insight:  Fair  Psychomotor Activity:  Psychomotor Retardation  Concentration:  Concentration: Fair and  Attention Span: Fair  Recall:  [employer] of Knowledge:  Fair  Language:  Fair  Akathisia:  Negative  Handed:  Right  AIMS (if indicated):     Assets:  Desire for Improvement Resilience  ADL's:  Intact  Cognition:  WNL  Sleep:  Number of Hours: 7.15     Treatment Plan Summary: Daily contact with patient to assess and evaluate symptoms and progress in treatment, Medication management and Plan : Patient is seen and examined.  Patient is a 63 year old female with the above-stated past psychiatric history who is seen in follow-up.   Diagnosis: #1 major depression, recurrent, severe without psychotic features, #2 posttraumatic stress disorder, #3 fibromyalgia, #4 hypertension, #5 GERD, #6 chronic pain.  Patient is seen and examined.  Patient is a 63 year old female with the above-stated past psychiatric history who is seen in follow-up.  She is essentially unchanged from yesterday on admission.  Her main concern today is pain.  For her depressive symptoms she had lithium carbonate added yesterday.  We will continue that.  I will re-add her oxycodone at 5 mg p.o. 3 times daily PRN.  No other changes in her psychiatric medications at  this time.  Her blood pressure may be elevated this morning because of some degree of opiate withdrawal.  That will have to be monitored. 1.  Continue hydroxyzine 50 mg p.o. 3 times daily as needed anxiety. 2.  Continue ibuprofen 200 mg p.o. every 6 hours as needed pain. 3.  Continue lithium carbonate 300 mg p.o. daily for mood stability and depression. 4.  Continue metoprolol tartrate 25 mg p.o. twice daily for hypertension. 5.  Continue Protonix 40 mg p.o. daily for GERD. 6.  Continue Seroquel 100 mg p.o. nightly for mood stability and sleep. 7.  Re-add oxycodone 5 mg p.o. 3 times daily as needed for pain. 8.  Patient received clonazepam from an outpatient provider on 02/27/2019.  It was 1 mg p.o. twice daily.  I will re-add that in the short run.  This is for  anxiety. 9.  Disposition planning-in progress.  Antonieta Pert, MD 03/22/2019, 9:16 AM

## 2019-03-22 NOTE — Plan of Care (Signed)
Patient is responding well in the unit and maintaining safety and social distancing accordingly, patient noted that her medication is working well as she is complying with treatment regimen, education and support is provided , patient described sleep as adequate and patient is happy for all of the help and support  she is getting from members of the staffs.no acute distress , 15 minutes safety checks is in progress.   Problem: Education: Goal: Knowledge of [facility] Education information/materials will improve Outcome: Progressing Goal: Emotional status will improve Outcome: Progressing Goal: Mental status will improve Outcome: Progressing Goal: Verbalization of understanding the information provided will improve Outcome: Progressing   Problem: Safety: Goal: Periods of time without injury will increase Outcome: Progressing   Problem: Education: Goal: Utilization of techniques to improve thought processes will improve Outcome: Progressing Goal: Knowledge of the prescribed therapeutic regimen will improve Outcome: Progressing   Problem: Safety: Goal: Ability to disclose and discuss suicidal ideas will improve Outcome: Progressing Goal: Ability to identify and utilize support systems that promote safety will improve Outcome: Progressing   Problem: Education: Goal: Knowledge of [facility] Education information/materials will improve Outcome: Progressing Goal: Emotional status will improve Outcome: Progressing Goal: Mental status will improve Outcome: Progressing Goal: Verbalization of understanding the information provided will improve Outcome: Progressing   Problem: Activity: Goal: Interest or engagement in activities will improve Outcome: Progressing Goal: Sleeping patterns will improve Outcome: Progressing   Problem: Coping: Goal: Ability to verbalize frustrations and anger appropriately will improve Outcome: Progressing Goal: Ability to demonstrate  self-control will improve Outcome: Progressing   Problem: Health Behavior/Discharge Planning: Goal: Identification of resources available to assist in meeting health care needs will improve Outcome: Progressing Goal: Compliance with treatment plan for underlying cause of condition will improve Outcome: Progressing   Problem: Physical Regulation: Goal: Ability to maintain clinical measurements within normal limits will improve Outcome: Progressing   Problem: Safety: Goal: Periods of time without injury will increase Outcome: Progressing   Problem: Activity: Goal: Interest or engagement in leisure activities will improve Outcome: Progressing Goal: Imbalance in normal sleep/wake cycle will improve Outcome: Progressing   Problem: Coping: Goal: Coping ability will improve Outcome: Progressing Goal: Will verbalize feelings Outcome: Progressing   Problem: Health Behavior/Discharge Planning: Goal: Ability to make decisions will improve Outcome: Progressing Goal: Compliance with therapeutic regimen will improve Outcome: Progressing   Problem: Role Relationship: Goal: Will demonstrate positive changes in social behaviors and relationships Outcome: Progressing   Problem: Self-Concept: Goal: Will verbalize positive feelings about self Outcome: Progressing Goal: Level of anxiety will decrease Outcome: Progressing   Problem: Education: Goal: Utilization of techniques to improve thought processes will improve Outcome: Progressing Goal: Knowledge of the prescribed therapeutic regimen will improve Outcome: Progressing   Problem: Activity: Goal: Interest or engagement in leisure activities will improve Outcome: Progressing Goal: Imbalance in normal sleep/wake cycle will improve Outcome: Progressing   Problem: Coping: Goal: Coping ability will improve Outcome: Progressing Goal: Will verbalize feelings Outcome: Progressing   Problem: Health Behavior/Discharge  Planning: Goal: Ability to make decisions will improve Outcome: Progressing Goal: Compliance with therapeutic regimen will improve Outcome: Progressing   Problem: Role Relationship: Goal: Will demonstrate positive changes in social behaviors and relationships Outcome: Progressing   Problem: Safety: Goal: Ability to disclose and discuss suicidal ideas will improve Outcome: Progressing Goal: Ability to identify and utilize support systems that promote safety will improve Outcome: Progressing   Problem: Self-Concept: Goal: Will verbalize positive feelings about self Outcome: Progressing Goal: Level  of anxiety will decrease Outcome: Progressing

## 2019-03-22 NOTE — Plan of Care (Signed)
  Problem: Education: Goal: Knowledge of [facility] Education information/materials will improve Outcome: Progressing Goal: Emotional status will improve Outcome: Progressing Goal: Mental status will improve Outcome: Progressing Goal: Verbalization of understanding the information provided will improve Outcome: Progressing   Problem: Safety: Goal: Periods of time without injury will increase Outcome: Progressing   Problem: Education: Goal: Utilization of techniques to improve thought processes will improve Outcome: Progressing Goal: Knowledge of the prescribed therapeutic regimen will improve Outcome: Progressing   Problem: Safety: Goal: Ability to disclose and discuss suicidal ideas will improve Outcome: Progressing Goal: Ability to identify and utilize support systems that promote safety will improve Outcome: Progressing   Problem: Education: Goal: Knowledge of [facility] Education information/materials will improve Outcome: Progressing Goal: Emotional status will improve Outcome: Progressing Goal: Mental status will improve Outcome: Progressing Goal: Verbalization of understanding the information provided will improve Outcome: Progressing   Problem: Activity: Goal: Interest or engagement in activities will improve Outcome: Progressing Goal: Sleeping patterns will improve Outcome: Progressing   Problem: Coping: Goal: Ability to verbalize frustrations and anger appropriately will improve Outcome: Progressing Goal: Ability to demonstrate self-control will improve Outcome: Progressing   Problem: Health Behavior/Discharge Planning: Goal: Identification of resources available to assist in meeting health care needs will improve Outcome: Progressing Goal: Compliance with treatment plan for underlying cause of condition will improve Outcome: Progressing   Problem: Physical Regulation: Goal: Ability to maintain clinical measurements within normal  limits will improve Outcome: Progressing   Problem: Safety: Goal: Periods of time without injury will increase Outcome: Progressing   Problem: Activity: Goal: Interest or engagement in leisure activities will improve Outcome: Progressing Goal: Imbalance in normal sleep/wake cycle will improve Outcome: Progressing   Problem: Coping: Goal: Coping ability will improve Outcome: Progressing Goal: Will verbalize feelings Outcome: Progressing   Problem: Health Behavior/Discharge Planning: Goal: Ability to make decisions will improve Outcome: Progressing Goal: Compliance with therapeutic regimen will improve Outcome: Progressing   Problem: Role Relationship: Goal: Will demonstrate positive changes in social behaviors and relationships Outcome: Progressing   Problem: Self-Concept: Goal: Will verbalize positive feelings about self Outcome: Progressing Goal: Level of anxiety will decrease Outcome: Progressing   Problem: Education: Goal: Utilization of techniques to improve thought processes will improve Outcome: Progressing Goal: Knowledge of the prescribed therapeutic regimen will improve Outcome: Progressing   Problem: Activity: Goal: Interest or engagement in leisure activities will improve Outcome: Progressing Goal: Imbalance in normal sleep/wake cycle will improve Outcome: Progressing   Problem: Coping: Goal: Coping ability will improve Outcome: Progressing Goal: Will verbalize feelings Outcome: Progressing   Problem: Health Behavior/Discharge Planning: Goal: Ability to make decisions will improve Outcome: Progressing Goal: Compliance with therapeutic regimen will improve Outcome: Progressing   Problem: Role Relationship: Goal: Will demonstrate positive changes in social behaviors and relationships Outcome: Progressing   Problem: Safety: Goal: Ability to disclose and discuss suicidal ideas will improve Outcome: Progressing Goal: Ability to identify and  utilize support systems that promote safety will improve Outcome: Progressing   Problem: Self-Concept: Goal: Will verbalize positive feelings about self Outcome: Progressing Goal: Level of anxiety will decrease Outcome: Progressing

## 2019-03-22 NOTE — Progress Notes (Signed)
Patient is pleasant upon approach , expressed depression but behaved appropriately in the  Milieu, compliant with her medication regimen, and maintaining safety in the unit, patient states that in general doing well with her medicines , she has no suicidal tendencies or plans and no thoughts about suicide , she think group participation has helped with coping skills., education and support is provided no distress and no PRNs only requiring 15 minutes safety checks for safety.

## 2019-03-23 MED ORDER — OXCARBAZEPINE 150 MG PO TABS
150.0000 mg | ORAL_TABLET | Freq: Two times a day (BID) | ORAL | Status: DC
  Administered 2019-03-23 – 2019-03-24 (×2): 150 mg via ORAL
  Filled 2019-03-23 (×3): qty 1

## 2019-03-23 MED ORDER — QUETIAPINE FUMARATE 200 MG PO TABS
200.0000 mg | ORAL_TABLET | Freq: Every day | ORAL | Status: DC
  Administered 2019-03-23: 200 mg via ORAL
  Filled 2019-03-23: qty 1

## 2019-03-23 MED ORDER — LISINOPRIL 20 MG PO TABS
10.0000 mg | ORAL_TABLET | Freq: Every day | ORAL | Status: DC
  Administered 2019-03-23 – 2019-03-26 (×3): 10 mg via ORAL
  Filled 2019-03-23 (×4): qty 1

## 2019-03-23 NOTE — Plan of Care (Signed)
Patient is alert and oriented x 4. Patient presents with sad, flat facial expression. Endorses some anxiety, Klonopin administered with effective results. Patient remains isolative to her room for the majority of the shift. Compliant with treatment plan, milieu remains safe with q 15 minute safety checks. Will continue to monitor.

## 2019-03-23 NOTE — Plan of Care (Signed)
Pt. Endorses an improving mood. Pt. Reports depression and anxiety improving. Pt. Denies si/hi/avh, able to contract for safety. Pt. Is complaint with medications and unit procedures.    Problem: Education: Goal: Emotional status will improve Outcome: Progressing Goal: Mental status will improve Outcome: Progressing   Problem: Safety: Goal: Periods of time without injury will increase Outcome: Progressing   Problem: Education: Goal: Emotional status will improve Outcome: Progressing Goal: Mental status will improve Outcome: Progressing   Problem: Health Behavior/Discharge Planning: Goal: Compliance with treatment plan for underlying cause of condition will improve Outcome: Progressing   Problem: Safety: Goal: Ability to disclose and discuss suicidal ideas will improve Outcome: Progressing

## 2019-03-23 NOTE — Progress Notes (Signed)
[facility] MD Progress Note  03/23/2019 10:48 AM Teresa Lang  MRN:  [id number] Subjective:  Patient is a 63 year old female with a past psychiatric history significant for major depression, posttraumatic stress disorder, fibromyalgia, hypertension and alcohol abuse who was admitted on 03/21/2019 with worsening depression.  Objective: Patient is seen and examined.  Patient is a 63 year old female with the above-stated past psychiatric history who is seen in follow-up.  She stated she still significantly anxious and woke up this morning "having a panic attack".  I discussed with her the fact that I had re-added her clonazepam prescription as well as her oxycodone prescription which she had had as an outpatient.  It does not appear that she feels it was of any benefit.  Again she reiterates that she is not necessarily suicidal, but would "be very happy if I was not around".  Her blood pressure was elevated today at 159/96, and she did sleep 7.25 hours.  Her medications include BuSpar 15 mg p.o. 3 times daily, clonazepam 1 mg p.o. twice daily as needed anxiety, fluoxetine 60 mg p.o. daily, hydroxyzine 50 mg p.o. 3 times daily as needed anxiety, and quetiapine 100 mg p.o. nightly.  Her blood alcohol on admission was less than 10, and her drug screen was completely negative including benzodiazepines.  With regards to her blood pressure, there is a note from cardiology on 02/27/2019 of having ordered lisinopril/hydrochlorothiazide 20-12.5 twice daily.  Unfortunately given her lithium being on an ACE inhibitor as well as a diuretic would be somewhat contraindicated.  That would also include the ibuprofen.  Her lithium was added on this admission on 6/12 by Dr. Toni Amend.  Principal Problem: MDD (major depressive disorder), recurrent, severe, with psychosis (HCC) Diagnosis: Principal Problem:   MDD (major depressive disorder), recurrent, severe, with psychosis (HCC) Active Problems:   PTSD (post-traumatic stress  disorder)   Fibromyalgia   Hyperlipemia, mixed   Hypertension, essential   Alcohol abuse  Total Time spent with patient: 20 minutes  Past Psychiatric History: See admission H&P  Past Medical History:  Past Medical History:  Diagnosis Date  . Allergy    seasonal  . Anxiety   . Arthritis   . Depression   . Hyperlipidemia   . Hypertension     Past Surgical History:  Procedure Laterality Date  . ABDOMINAL HYSTERECTOMY  1983  . CHOLECYSTECTOMY  1983  . SPINE SURGERY  beginning 1994   laminectomies, fusions   Family History:  Family History  Problem Relation Age of Onset  . Celiac disease Sister   . Breast cancer Neg Hx    Family Psychiatric  History: See admission H&P Social History:  Social History   Substance and Sexual Activity  Alcohol Use Never  . Frequency: Never     Social History   Substance and Sexual Activity  Drug Use Yes  . Frequency: 7.0 times per week  . Types: Marijuana   Comment: "maybe once in the evening"; also uses "KRATUM, the herb, to helps with pain and anxiety"    Social History   Socioeconomic History  . Marital status: Widowed    Spouse name: Not on file  . Number of children: Not on file  . Years of education: Not on file  . Highest education level: Not on file  Occupational History  . Occupation: disability  Social Needs  . Financial resource strain: Not on file  . Food insecurity    Worry: Not on file    Inability: Not on file  .  Transportation needs    Medical: Not on file    Non-medical: Not on file  Tobacco Use  . Smoking status: Current Every Day Smoker    Packs/day: 0.25    Years: 57.00    Pack years: 14.25    Types: Cigarettes    Start date: 57  . Smokeless tobacco: Never Used  . Tobacco comment: Also VAPES on a daily basis  Substance and Sexual Activity  . Alcohol use: Never    Frequency: Never  . Drug use: Yes    Frequency: 7.0 times per week    Types: Marijuana    Comment: "maybe once in the evening";  also uses "KRATUM, the herb, to helps with pain and anxiety"  . Sexual activity: Not on file  Lifestyle  . Physical activity    Days per week: Not on file    Minutes per session: Not on file  . Stress: Not on file  Relationships  . Social Musician on phone: Not on file    Gets together: Not on file    Attends religious service: Not on file    Active member of club or organization: Not on file    Attends meetings of clubs or organizations: Not on file    Relationship status: Not on file  Other Topics Concern  . Not on file  Social History Narrative   Lives with sister   Additional Social History:                         Sleep: Fair  Appetite:  Fair  Current Medications: Current Facility-Administered Medications  Medication Dose Route Frequency Provider Last Rate Last Dose  . acetaminophen (TYLENOL) tablet 650 mg  650 mg Oral Q6H PRN Mariel Craft, MD      . albuterol (PROVENTIL) (2.5 MG/3ML) 0.083% nebulizer solution 2.5 mg  2.5 mg Inhalation Q6H PRN Mariel Craft, MD      . alum & mag hydroxide-simeth (MAALOX/MYLANTA) 200-200-20 MG/5ML suspension 30 mL  30 mL Oral Q4H PRN Mariel Craft, MD      . aspirin EC tablet 81 mg  81 mg Oral Daily Mariel Craft, MD   81 mg at 03/23/19 0756  . atorvastatin (LIPITOR) tablet 40 mg  40 mg Oral QPM Mariel Craft, MD   40 mg at 03/22/19 1712  . busPIRone (BUSPAR) tablet 15 mg  15 mg Oral TID Mariel Craft, MD   15 mg at 03/23/19 0756  . clonazePAM (KLONOPIN) tablet 1 mg  1 mg Oral BID PRN Antonieta Pert, MD   1 mg at 03/23/19 0758  . diphenhydrAMINE (BENADRYL) capsule 50 mg  50 mg Oral Q6H PRN Mariel Craft, MD      . FLUoxetine (PROZAC) capsule 60 mg  60 mg Oral Daily Mariel Craft, MD   60 mg at 03/23/19 0756  . fluticasone furoate-vilanterol (BREO ELLIPTA) 100-25 MCG/INH 1 puff  1 puff Inhalation Daily Mariel Craft, MD   1 puff at 03/23/19 0755  . folic acid (FOLVITE) tablet 1 mg  1 mg  Oral Daily Mariel Craft, MD   1 mg at 03/23/19 0756  . hydrOXYzine (ATARAX/VISTARIL) tablet 50 mg  50 mg Oral TID PRN Mariel Craft, MD   50 mg at 03/22/19 6237  . ibuprofen (ADVIL) tablet 200 mg  200 mg Oral Q6H PRN Mariel Craft, MD   200 mg at  03/22/19 0959  . isosorbide mononitrate (IMDUR) 24 hr tablet 30 mg  30 mg Oral BID Mariel CraftMaurer, Sheila M, MD   30 mg at 03/23/19 0756  . lithium carbonate (LITHOBID) CR tablet 300 mg  300 mg Oral QHS Clapacs, John T, MD   300 mg at 03/22/19 2123  . magnesium hydroxide (MILK OF MAGNESIA) suspension 30 mL  30 mL Oral Daily PRN Mariel CraftMaurer, Sheila M, MD      . metoprolol tartrate (LOPRESSOR) tablet 25 mg  25 mg Oral BID Mariel CraftMaurer, Sheila M, MD   25 mg at 03/23/19 0758  . nicotine (NICODERM CQ - dosed in mg/24 hours) patch 14 mg  14 mg Transdermal Daily Clapacs, Jackquline DenmarkJohn T, MD   14 mg at 03/23/19 0757  . OLANZapine zydis (ZYPREXA) disintegrating tablet 10 mg  10 mg Oral Q8H PRN Mariel CraftMaurer, Sheila M, MD       And  . ziprasidone (GEODON) injection 20 mg  20 mg Intramuscular PRN Mariel CraftMaurer, Sheila M, MD      . olopatadine (PATANOL) 0.1 % ophthalmic solution 1 drop  1 drop Both Eyes BID Mariel CraftMaurer, Sheila M, MD      . oxyCODONE (Oxy IR/ROXICODONE) immediate release tablet 5 mg  5 mg Oral Q6H PRN Antonieta Pertlary, Greg Lawson, MD   5 mg at 03/23/19 1029  . pantoprazole (PROTONIX) EC tablet 40 mg  40 mg Oral Daily Mariel CraftMaurer, Sheila M, MD   40 mg at 03/23/19 0756  . QUEtiapine (SEROQUEL) tablet 100 mg  100 mg Oral QHS Mariel CraftMaurer, Sheila M, MD   100 mg at 03/22/19 2123  . umeclidinium bromide (INCRUSE ELLIPTA) 62.5 MCG/INH 1 puff  1 puff Inhalation Daily Mariel CraftMaurer, Sheila M, MD   1 puff at 03/23/19 0754    Lab Results: No results found for this or any previous visit (from the past 48 hour(s)).  Blood Alcohol level:  Lab Results  Component Value Date   ETH <10 03/21/2019   ETH <10 03/15/2019    Metabolic Disorder Labs: Lab Results  Component Value Date   HGBA1C 5.5 03/21/2019   MPG 111.15  03/21/2019   No results found for: PROLACTIN Lab Results  Component Value Date   CHOL 145 03/21/2019   TRIG 90 03/21/2019   HDL 55 03/21/2019   CHOLHDL 2.6 03/21/2019   VLDL 18 03/21/2019   LDLCALC 72 03/21/2019    Physical Findings: AIMS: Facial and Oral Movements Muscles of Facial Expression: None, normal Lips and Perioral Area: None, normal Jaw: None, normal Tongue: None, normal,Extremity Movements Upper (arms, wrists, hands, fingers): None, normal Lower (legs, knees, ankles, toes): None, normal, Trunk Movements Neck, shoulders, hips: None, normal, Overall Severity Severity of abnormal movements (highest score from questions above): None, normal Incapacitation due to abnormal movements: None, normal Patient's awareness of abnormal movements (rate only patient's report): No Awareness, Dental Status Current problems with teeth and/or dentures?: No Does patient usually wear dentures?: No  CIWA:  CIWA-Ar Total: 4 COWS:  COWS Total Score: 1  Musculoskeletal: Strength & Muscle Tone: within normal limits Gait & Station: normal Patient leans: N/A  Psychiatric Specialty Exam: Physical Exam  Nursing note and vitals reviewed. Constitutional: She is oriented to person, place, and time. She appears well-developed and well-nourished.  HENT:  Head: Normocephalic and atraumatic.  Respiratory: Effort normal.  Neurological: She is alert and oriented to person, place, and time.    ROS  Blood pressure (!) 159/96, pulse 67, temperature 98.7 F (37.1 C), temperature source Oral, resp. rate  18, height 5\' 5"  (1.651 m), weight 86.2 kg, SpO2 94 %.Body mass index is 31.62 kg/m.  General Appearance: Casual  Eye Contact:  Fair  Speech:  Pressured  Volume:  Increased  Mood:  Anxious  Affect:  Congruent  Thought Process:  Coherent and Descriptions of Associations: Intact  Orientation:  Full (Time, Place, and Person)  Thought Content:  Rumination  Suicidal Thoughts:  Yes.  without  intent/plan  Homicidal Thoughts:  No  Memory:  Immediate;   Fair Recent;   Fair Remote;   Fair  Judgement:  Intact  Insight:  Fair  Psychomotor Activity:  Increased  Concentration:  Concentration: Fair and Attention Span: Fair  Recall:  [employer] of Knowledge:  Fair  Language:  Fair  Akathisia:  Negative  Handed:  Right  AIMS (if indicated):     Assets:  Desire for Improvement Resilience  ADL's:  Intact  Cognition:  WNL  Sleep:  Number of Hours: 7.25     Treatment Plan Summary: Daily contact with patient to assess and evaluate symptoms and progress in treatment, Medication management and Plan : Patient is seen and examined.  Patient is a 63 year old female with the above-stated past psychiatric history who is seen in follow-up.   Diagnosis: #1 major depression, recurrent, severe without psychotic features, #2 posttraumatic stress disorder, #3 fibromyalgia, #4 hypertension, #5 GERD, #6 chronic pain.  Patient is seen in follow-up.  She is essentially unchanged from yesterday.  She remains significantly anxious and agitated.  Her blood pressure remains elevated, and part of that I am sure is her underlying hypertension problem as well as her continued problems with anxiety.  Given cardiology had placed her on a diuretic and as well as an ACE inhibitor for her hypertension and the relative contraindication with lithium treatment.  I am going to stop her lithium today.  I am going to add Trileptal 150 mg p.o. twice daily and attempt to decrease her anxiety, give her a bit of mood stability, and to get away from the lithium so that I can re-add her previous antihypertensive medication.  Hopefully this will benefit [place] in the fact that we will not have to increase her Klonopin dosage given her reported alcohol history.  I will also increase her Seroquel to 200 mg p.o. nightly for mood stability and sleep.  No other changes in her medications.  1.  Continue hydroxyzine 50 mg p.o. 3 times daily  as needed anxiety. 2.  Continue ibuprofen 200 mg p.o. every 6 hours as needed pain. 3.  Hold lithium carbonate 300 mg p.o. daily for mood stability and depression.  If Trileptal does not appear to be of any benefit at this may be restarted. 4.  Trileptal 150 mg p.o. twice daily for mood stability as well as anxiety. 5.  Continue metoprolol tartrate 25 mg p.o. twice daily for hypertension. 6.  Continue Protonix 40 mg p.o. daily for GERD. 7.  Increase Seroquel to 200 mg p.o. nightly for mood stability and sleep. 8.    Continue oxycodone 5 mg p.o. 3 times daily as needed for pain. 9.  Patient received clonazepam from an outpatient provider on 02/27/2019.  It was 1 mg p.o. twice daily.  I will continue this in the short run.  This is for anxiety. 10.  Add lisinopril 10 mg p.o. daily for hypertension and titrate 11.  Disposition planning-in progress.  Sharma Covert, MD 03/23/2019, 10:48 AM

## 2019-03-23 NOTE — [facility] Group Notes (Signed)
LCSW Group Therapy Note 03/23/2019 1:15pm  Type of Therapy and Topic: Group Therapy: Feelings Around Returning Home & Establishing a Supportive Framework and Supporting Oneself When Supports Not Available  Participation Level: Did Not Attend  Description of Group:  Patients first processed thoughts and feelings about upcoming discharge. These included fears of upcoming changes, lack of change, new living environments, judgements and expectations from others and overall stigma of mental health issues. The group then discussed the definition of a supportive framework, what that looks and feels like, and how do to discern it from an unhealthy non-supportive network. The group identified different types of supports as well as what to do when your family/friends are less than helpful or unavailable  Therapeutic Goals  1. Patient will identify one healthy supportive network that they can use at discharge. 2. Patient will identify one factor of a supportive framework and how to tell it from an unhealthy network. 3. Patient able to identify one coping skill to use when they do not have positive supports from others. 4. Patient will demonstrate ability to communicate their needs through discussion and/or role plays.  Summary of Patient Progress:  Pt was invited to attend group but chose not to attend. CSW will continue to encourage pt to attend group throughout their admission.   Therapeutic Modalities Cognitive Behavioral Therapy Motivational Interviewing   Teresa Lang  CUEBAS-COLON, LCSW 03/23/2019 11:42 AM

## 2019-03-24 MED ORDER — LITHIUM CARBONATE 300 MG PO CAPS
300.0000 mg | ORAL_CAPSULE | Freq: Every day | ORAL | Status: DC
  Administered 2019-03-24 – 2019-03-25 (×2): 300 mg via ORAL
  Filled 2019-03-24 (×2): qty 1

## 2019-03-24 NOTE — Progress Notes (Signed)
D: Pt during assessments denies SI/HI/AVH, able to contract for safety. Pt. Endorses an improving mood overall. Pt. Reports anxiety and depression is improving. Pt. Complaints of a mild headache, but refuses PRN medication for pain.   A: Q x 15 minute observation checks were completed for safety. Patient was provided with education. Patient was given/offered medications per orders. Patient  was encourage to attend groups, participate in unit activities and continue with plan of care. Pt. Chart and plans of care reviewed. Pt. Given support and encouragement.   R: Patient is complaint with medications this shift. Pt. Mostly isolative and withdrawn to room resting in her room. Pt. Does not attend snack time.             Precautionary checks every 15 minutes for safety maintained, room free of safety hazards, patient sustains no injury or falls during this shift. Will endorse care to next shift.

## 2019-03-24 NOTE — Progress Notes (Signed)
Patient is stable and oriented , but continue to express depression and anxiety at 7/10, and feeling passive suicide ideations but contract for safety of self and others , denies any delusions and hallucinations, education and support is extended to patient, encourage patient to engage with peers in scheduled activities. Patient stays in her room most of the time, but maintaining safety ,compliant with her medications, appetite is good and well hydrated with fluids and juices , patient had no further complains only monitored every 15 minutes for safety, no distress.

## 2019-03-24 NOTE — [facility] Group Notes (Signed)
LCSW Group Therapy Note   03/24/2019 12:59 PM   Type of Therapy and Topic:  Group Therapy:  Overcoming Obstacles   Participation Level:  None   Description of Group:    In this group patients will be encouraged to explore what they see as obstacles to their own wellness and recovery. They will be guided to discuss their thoughts, feelings, and behaviors related to these obstacles. The group will process together ways to cope with barriers, with attention given to specific choices patients can make. Each patient will be challenged to identify changes they are motivated to make in order to overcome their obstacles. This group will be process-oriented, with patients participating in exploration of their own experiences as well as giving and receiving support and challenge from other group members.   Therapeutic Goals: 1. Patient will identify personal and current obstacles as they relate to admission. 2. Patient will identify barriers that currently interfere with their wellness or overcoming obstacles.  3. Patient will identify feelings, thought process and behaviors related to these barriers. 4. Patient will identify two changes they are willing to make to overcome these obstacles:      Summary of Patient Progress Pt was present in group, but did not engage in the group discussion.     Therapeutic Modalities:   Cognitive Behavioral Therapy Solution Focused Therapy Motivational Interviewing Relapse Prevention Therapy  Evalina Field, MSW, LCSW Clinical Social Work 03/24/2019 12:59 PM

## 2019-03-24 NOTE — Progress Notes (Signed)
[facility] MD Progress Note  03/24/2019 5:10 PM Teresa Lang  MRN:  [id number] Subjective: Patient seen and chart reviewed.  Patient with major depression severe recurrent.  Patient says her mood remains down depressed and anxious.  Feels hopeless much of the time.  Has passive suicidal thoughts.  Denies hallucinations or psychotic symptoms.  Still very anxious.  Does not get out and interact with others very much.  Passive suicidal thoughts present no homicidal ideation.  Multiple changes were made in her medicine over the weekend.  Currently blood pressure is under good control.  No new physical complaints Principal Problem: MDD (major depressive disorder), recurrent, severe, with psychosis ([place]) Diagnosis: Principal Problem:   MDD (major depressive disorder), recurrent, severe, with psychosis ([place]) Active Problems:   PTSD (post-traumatic stress disorder)   Fibromyalgia   Hyperlipemia, mixed   Hypertension, essential   Alcohol abuse  Total Time spent with patient: 30 minutes  Past Psychiatric History: Patient has a history of recurrent depression and multiple substance abuse  Past Medical History:  Past Medical History:  Diagnosis Date  . Allergy    seasonal  . Anxiety   . Arthritis   . Depression   . Hyperlipidemia   . Hypertension     Past Surgical History:  Procedure Laterality Date  . ABDOMINAL HYSTERECTOMY  1983  . CHOLECYSTECTOMY  1983  . SPINE SURGERY  beginning 1994   laminectomies, fusions   Family History:  Family History  Problem Relation Age of Onset  . Celiac disease Sister   . Breast cancer Neg Hx    Family Psychiatric  History: See previous Social History:  Social History   Substance and Sexual Activity  Alcohol Use Never  . Frequency: Never     Social History   Substance and Sexual Activity  Drug Use Yes  . Frequency: 7.0 times per week  . Types: Marijuana   Comment: "maybe once in the evening"; also uses "KRATUM, the herb, to helps with  pain and anxiety"    Social History   Socioeconomic History  . Marital status: Widowed    Spouse name: Not on file  . Number of children: Not on file  . Years of education: Not on file  . Highest education level: Not on file  Occupational History  . Occupation: disability  Social Needs  . Financial resource strain: Not on file  . Food insecurity    Worry: Not on file    Inability: Not on file  . Transportation needs    Medical: Not on file    Non-medical: Not on file  Tobacco Use  . Smoking status: Current Every Day Smoker    Packs/day: 0.25    Years: 57.00    Pack years: 14.25    Types: Cigarettes    Start date: 35  . Smokeless tobacco: Never Used  . Tobacco comment: Also VAPES on a daily basis  Substance and Sexual Activity  . Alcohol use: Never    Frequency: Never  . Drug use: Yes    Frequency: 7.0 times per week    Types: Marijuana    Comment: "maybe once in the evening"; also uses "KRATUM, the herb, to helps with pain and anxiety"  . Sexual activity: Not on file  Lifestyle  . Physical activity    Days per week: Not on file    Minutes per session: Not on file  . Stress: Not on file  Relationships  . Social Herbalist on phone:  Not on file    Gets together: Not on file    Attends religious service: Not on file    Active member of club or organization: Not on file    Attends meetings of clubs or organizations: Not on file    Relationship status: Not on file  Other Topics Concern  . Not on file  Social History Narrative   Lives with sister   Additional Social History:                         Sleep: Fair  Appetite:  Fair  Current Medications: Current Facility-Administered Medications  Medication Dose Route Frequency Provider Last Rate Last Dose  . acetaminophen (TYLENOL) tablet 650 mg  650 mg Oral Q6H PRN Mariel Craft, MD      . albuterol (PROVENTIL) (2.5 MG/3ML) 0.083% nebulizer solution 2.5 mg  2.5 mg Inhalation Q6H PRN  Mariel Craft, MD      . alum & mag hydroxide-simeth (MAALOX/MYLANTA) 200-200-20 MG/5ML suspension 30 mL  30 mL Oral Q4H PRN Mariel Craft, MD      . aspirin EC tablet 81 mg  81 mg Oral Daily Mariel Craft, MD   81 mg at 03/24/19 0830  . atorvastatin (LIPITOR) tablet 40 mg  40 mg Oral QPM Mariel Craft, MD   40 mg at 03/23/19 1702  . clonazePAM (KLONOPIN) tablet 1 mg  1 mg Oral BID PRN Antonieta Pert, MD   1 mg at 03/24/19 6759  . FLUoxetine (PROZAC) capsule 60 mg  60 mg Oral Daily Mariel Craft, MD   60 mg at 03/24/19 0829  . fluticasone furoate-vilanterol (BREO ELLIPTA) 100-25 MCG/INH 1 puff  1 puff Inhalation Daily Mariel Craft, MD   1 puff at 03/24/19 0844  . folic acid (FOLVITE) tablet 1 mg  1 mg Oral Daily Mariel Craft, MD   1 mg at 03/24/19 0830  . hydrOXYzine (ATARAX/VISTARIL) tablet 50 mg  50 mg Oral TID PRN Mariel Craft, MD   50 mg at 03/22/19 1638  . ibuprofen (ADVIL) tablet 200 mg  200 mg Oral Q6H PRN Mariel Craft, MD   200 mg at 03/23/19 1607  . isosorbide mononitrate (IMDUR) 24 hr tablet 30 mg  30 mg Oral BID Mariel Craft, MD   30 mg at 03/24/19 0844  . lisinopril (ZESTRIL) tablet 10 mg  10 mg Oral Daily Antonieta Pert, MD   10 mg at 03/24/19 0830  . lithium carbonate capsule 300 mg  300 mg Oral QHS Adron Geisel T, MD      . magnesium hydroxide (MILK OF MAGNESIA) suspension 30 mL  30 mL Oral Daily PRN Mariel Craft, MD      . metoprolol tartrate (LOPRESSOR) tablet 25 mg  25 mg Oral BID Mariel Craft, MD   25 mg at 03/24/19 0829  . nicotine (NICODERM CQ - dosed in mg/24 hours) patch 14 mg  14 mg Transdermal Daily Derryck Shahan, Jackquline Denmark, MD   14 mg at 03/24/19 0830  . olopatadine (PATANOL) 0.1 % ophthalmic solution 1 drop  1 drop Both Eyes BID Mariel Craft, MD   1 drop at 03/24/19 0845  . oxyCODONE (Oxy IR/ROXICODONE) immediate release tablet 5 mg  5 mg Oral Q6H PRN Antonieta Pert, MD   5 mg at 03/24/19 1410  . pantoprazole (PROTONIX)  EC tablet 40 mg  40 mg Oral Daily Viviano Simas,  Orlean BradfordSheila M, MD   40 mg at 03/24/19 0830  . umeclidinium bromide (INCRUSE ELLIPTA) 62.5 MCG/INH 1 puff  1 puff Inhalation Daily Mariel CraftMaurer, Sheila M, MD   1 puff at 03/24/19 0831    Lab Results: No results found for this or any previous visit (from the past 48 hour(s)).  Blood Alcohol level:  Lab Results  Component Value Date   ETH <10 03/21/2019   ETH <10 03/15/2019    Metabolic Disorder Labs: Lab Results  Component Value Date   HGBA1C 5.5 03/21/2019   MPG 111.15 03/21/2019   No results found for: PROLACTIN Lab Results  Component Value Date   CHOL 145 03/21/2019   TRIG 90 03/21/2019   HDL 55 03/21/2019   CHOLHDL 2.6 03/21/2019   VLDL 18 03/21/2019   LDLCALC 72 03/21/2019    Physical Findings: AIMS: Facial and Oral Movements Muscles of Facial Expression: None, normal Lips and Perioral Area: None, normal Jaw: None, normal Tongue: None, normal,Extremity Movements Upper (arms, wrists, hands, fingers): None, normal Lower (legs, knees, ankles, toes): None, normal, Trunk Movements Neck, shoulders, hips: None, normal, Overall Severity Severity of abnormal movements (highest score from questions above): None, normal Incapacitation due to abnormal movements: None, normal Patient's awareness of abnormal movements (rate only patient's report): No Awareness, Dental Status Current problems with teeth and/or dentures?: No Does patient usually wear dentures?: No  CIWA:  CIWA-Ar Total: 4 COWS:  COWS Total Score: 1  Musculoskeletal: Strength & Muscle Tone: within normal limits Gait & Station: normal Patient leans: N/A  Psychiatric Specialty Exam: Physical Exam  Nursing note and vitals reviewed. Constitutional: She appears well-developed and well-nourished.  HENT:  Head: Normocephalic and atraumatic.  Eyes: Pupils are equal, round, and reactive to light. Conjunctivae are normal.  Neck: Normal range of motion.  Cardiovascular: Regular rhythm  and normal heart sounds.  Respiratory: Effort normal. No respiratory distress.  GI: Soft.  Musculoskeletal: Normal range of motion.  Neurological: She is alert.  Skin: Skin is warm and dry.  Psychiatric: Judgment normal. Her speech is delayed. She is slowed. Cognition and memory are normal. She exhibits a depressed mood. She expresses suicidal ideation. She expresses no suicidal plans.    Review of Systems  Constitutional: Negative.   HENT: Negative.   Eyes: Negative.   Respiratory: Negative.   Cardiovascular: Negative.   Gastrointestinal: Negative.   Musculoskeletal: Negative.   Skin: Negative.   Neurological: Negative.   Psychiatric/Behavioral: Positive for depression and suicidal ideas. Negative for hallucinations, memory loss and substance abuse. The patient is nervous/anxious and has insomnia.     Blood pressure (!) 126/58, pulse 70, temperature 98 F (36.7 C), temperature source Oral, resp. rate 17, height 5\' 5"  (1.651 m), weight 86.2 kg, SpO2 95 %.Body mass index is 31.62 kg/m.  General Appearance: Casual  Eye Contact:  Fair  Speech:  Slow  Volume:  Decreased  Mood:  Depressed  Affect:  Depressed  Thought Process:  Coherent  Orientation:  Full (Time, Place, and Person)  Thought Content:  Logical  Suicidal Thoughts:  Yes.  without intent/plan  Homicidal Thoughts:  No  Memory:  Immediate;   Fair Recent;   Fair Remote;   Poor  Judgement:  Intact  Insight:  Fair  Psychomotor Activity:  Decreased  Concentration:  Concentration: Fair  Recall:  [employer]Fair  Fund of Knowledge:  Fair  Language:  Fair  Akathisia:  No  Handed:  Right  AIMS (if indicated):     Assets:  Desire  for Improvement Housing Physical Health Resilience  ADL's:  Intact  Cognition:  WNL  Sleep:  Number of Hours: 7.25     Treatment Plan Summary: Plan Addressed her medications today.  No evidence that Trileptal has been or would be of any benefit in this condition.  Discontinue Trileptal.  No evidence  in this patient that BuSpar has been helpful.  Discontinue BuSpar.  We are trying to limit controlled substances because of her history of polysubstance abuse.  Reviewing her labs I am less concerned then the doctor over the weekend was about the addition of low-dose lithium to help with suicidality.  Restart lithium 300 mg at night and will follow labs.  Encourage group attendance.  Mordecai Rasmussen, MD 03/24/2019, 5:10 PM

## 2019-03-24 NOTE — Plan of Care (Signed)
Patient states "my depression is not any worse.I don't feel like doing anything."Denies SI,HI and AVH.Attended groups with encouragement.Appetite and energy level fair.Compliant with medications.Support and encouragement given.

## 2019-03-24 NOTE — Progress Notes (Signed)
Recreation Therapy Notes   Date: 03/24/2019  Time: 9:30 am   Location: Craft room   Behavioral response: N/A   Intervention Topic: Necessities  Discussion/Intervention: Patient did not attend group.   Clinical Observations/Feedback:  Patient did not attend group.   Teresa Lang LRT/CTRS        [name] 03/24/2019 10:22 AM

## 2019-03-25 MED ORDER — CYCLOBENZAPRINE HCL 10 MG PO TABS
5.0000 mg | ORAL_TABLET | Freq: Three times a day (TID) | ORAL | Status: DC | PRN
  Administered 2019-03-26: 22:00:00 10 mg via ORAL
  Filled 2019-03-25: qty 1

## 2019-03-25 NOTE — Progress Notes (Signed)
[facility] MD Progress Note  03/25/2019 5:03 PM Teresa Lang  MRN:  [id number] Subjective: Patient seen and chart reviewed.  Patient with major depression.  Reports feeling slightly better today.  Her focus is on her pain in her shoulder.  No active suicidal thoughts.  Has gotten up and taken care of her basic ADLs.  Attends some groups. Principal Problem: MDD (major depressive disorder), recurrent, severe, with psychosis ([facility]) Diagnosis: Principal Problem:   MDD (major depressive disorder), recurrent, severe, with psychosis ([facility]) Active Problems:   PTSD (post-traumatic stress disorder)   Fibromyalgia   Hyperlipemia, mixed   Hypertension, essential   Alcohol abuse  Total Time spent with patient: 20 minutes  Past Psychiatric History: Past history of recurrent severe depression with poor tolerance of ECT  Past Medical History:  Past Medical History:  Diagnosis Date  . Allergy    seasonal  . Anxiety   . Arthritis   . Depression   . Hyperlipidemia   . Hypertension     Past Surgical History:  Procedure Laterality Date  . ABDOMINAL HYSTERECTOMY  1983  . CHOLECYSTECTOMY  1983  . SPINE SURGERY  beginning 1994   laminectomies, fusions   Family History:  Family History  Problem Relation Age of Onset  . Celiac disease Sister   . Breast cancer Neg Hx    Family Psychiatric  History: See previous Social History:  Social History   Substance and Sexual Activity  Alcohol Use Never  . Frequency: Never     Social History   Substance and Sexual Activity  Drug Use Yes  . Frequency: 7.0 times per week  . Types: Marijuana   Comment: "maybe once in the evening"; also uses "KRATUM, the herb, to helps with pain and anxiety"    Social History   Socioeconomic History  . Marital status: Widowed    Spouse name: Not on file  . Number of children: Not on file  . Years of education: Not on file  . Highest education level: Not on file  Occupational History  . Occupation:  disability  Social Needs  . Financial resource strain: Not on file  . Food insecurity    Worry: Not on file    Inability: Not on file  . Transportation needs    Medical: Not on file    Non-medical: Not on file  Tobacco Use  . Smoking status: Current Every Day Smoker    Packs/day: 0.25    Years: 57.00    Pack years: 14.25    Types: Cigarettes    Start date: 3  . Smokeless tobacco: Never Used  . Tobacco comment: Also VAPES on a daily basis  Substance and Sexual Activity  . Alcohol use: Never    Frequency: Never  . Drug use: Yes    Frequency: 7.0 times per week    Types: Marijuana    Comment: "maybe once in the evening"; also uses "KRATUM, the herb, to helps with pain and anxiety"  . Sexual activity: Not on file  Lifestyle  . Physical activity    Days per week: Not on file    Minutes per session: Not on file  . Stress: Not on file  Relationships  . Social Musician on phone: Not on file    Gets together: Not on file    Attends religious service: Not on file    Active member of club or organization: Not on file    Attends meetings of clubs or organizations:  Not on file    Relationship status: Not on file  Other Topics Concern  . Not on file  Social History Narrative   Lives with sister   Additional Social History:                         Sleep: Fair  Appetite:  Fair  Current Medications: Current Facility-Administered Medications  Medication Dose Route Frequency Provider Last Rate Last Dose  . acetaminophen (TYLENOL) tablet 650 mg  650 mg Oral Q6H PRN Mariel CraftMaurer, Sheila M, MD      . albuterol (PROVENTIL) (2.5 MG/3ML) 0.083% nebulizer solution 2.5 mg  2.5 mg Inhalation Q6H PRN Mariel CraftMaurer, Sheila M, MD      . alum & mag hydroxide-simeth (MAALOX/MYLANTA) 200-200-20 MG/5ML suspension 30 mL  30 mL Oral Q4H PRN Mariel CraftMaurer, Sheila M, MD      . aspirin EC tablet 81 mg  81 mg Oral Daily Mariel CraftMaurer, Sheila M, MD   81 mg at 03/25/19 0755  . atorvastatin (LIPITOR)  tablet 40 mg  40 mg Oral QPM Mariel CraftMaurer, Sheila M, MD   40 mg at 03/24/19 1713  . clonazePAM (KLONOPIN) tablet 1 mg  1 mg Oral BID PRN Antonieta Pertlary, Greg Lawson, MD   1 mg at 03/24/19 [id number]  . cyclobenzaprine (FLEXERIL) tablet 5 mg  5 mg Oral TID PRN Clapacs, John T, MD      . FLUoxetine (PROZAC) capsule 60 mg  60 mg Oral Daily Mariel CraftMaurer, Sheila M, MD   60 mg at 03/25/19 0754  . fluticasone furoate-vilanterol (BREO ELLIPTA) 100-25 MCG/INH 1 puff  1 puff Inhalation Daily Mariel CraftMaurer, Sheila M, MD   1 puff at 03/25/19 0757  . folic acid (FOLVITE) tablet 1 mg  1 mg Oral Daily Mariel CraftMaurer, Sheila M, MD   1 mg at 03/25/19 0754  . hydrOXYzine (ATARAX/VISTARIL) tablet 50 mg  50 mg Oral TID PRN Mariel CraftMaurer, Sheila M, MD   50 mg at 03/22/19 [id number]  . ibuprofen (ADVIL) tablet 200 mg  200 mg Oral Q6H PRN Mariel CraftMaurer, Sheila M, MD   200 mg at 03/25/19 1110  . isosorbide mononitrate (IMDUR) 24 hr tablet 30 mg  30 mg Oral BID Mariel CraftMaurer, Sheila M, MD   30 mg at 03/25/19 1616  . lisinopril (ZESTRIL) tablet 10 mg  10 mg Oral Daily Antonieta Pertlary, Greg Lawson, MD   10 mg at 03/24/19 0830  . lithium carbonate capsule 300 mg  300 mg Oral QHS Clapacs, Jackquline DenmarkJohn T, MD   300 mg at 03/24/19 2137  . magnesium hydroxide (MILK OF MAGNESIA) suspension 30 mL  30 mL Oral Daily PRN Mariel CraftMaurer, Sheila M, MD      . metoprolol tartrate (LOPRESSOR) tablet 25 mg  25 mg Oral BID Mariel CraftMaurer, Sheila M, MD   25 mg at 03/24/19 0829  . nicotine (NICODERM CQ - dosed in mg/24 hours) patch 14 mg  14 mg Transdermal Daily Clapacs, John T, MD   14 mg at 03/25/19 0800  . olopatadine (PATANOL) 0.1 % ophthalmic solution 1 drop  1 drop Both Eyes BID Mariel CraftMaurer, Sheila M, MD   1 drop at 03/24/19 0845  . oxyCODONE (Oxy IR/ROXICODONE) immediate release tablet 5 mg  5 mg Oral Q6H PRN Antonieta Pertlary, Greg Lawson, MD   5 mg at 03/25/19 1616  . pantoprazole (PROTONIX) EC tablet 40 mg  40 mg Oral Daily Mariel CraftMaurer, Sheila M, MD   40 mg at 03/25/19 0755  . umeclidinium bromide (INCRUSE ELLIPTA) 62.5 MCG/INH  1 puff  1 puff Inhalation  Daily Lavella Hammock, MD   1 puff at 03/25/19 9798    Lab Results: No results found for this or any previous visit (from the past 48 hour(s)).  Blood Alcohol level:  Lab Results  Component Value Date   ETH <10 03/21/2019   ETH <10 92/08/9416    Metabolic Disorder Labs: Lab Results  Component Value Date   HGBA1C 5.5 03/21/2019   MPG 111.15 03/21/2019   No results found for: PROLACTIN Lab Results  Component Value Date   CHOL 145 03/21/2019   TRIG 90 03/21/2019   HDL 55 03/21/2019   CHOLHDL 2.6 03/21/2019   VLDL 18 03/21/2019   LDLCALC 72 03/21/2019    Physical Findings: AIMS: Facial and Oral Movements Muscles of Facial Expression: None, normal Lips and Perioral Area: None, normal Jaw: None, normal Tongue: None, normal,Extremity Movements Upper (arms, wrists, hands, fingers): None, normal Lower (legs, knees, ankles, toes): None, normal, Trunk Movements Neck, shoulders, hips: None, normal, Overall Severity Severity of abnormal movements (highest score from questions above): None, normal Incapacitation due to abnormal movements: None, normal Patient's awareness of abnormal movements (rate only patient's report): No Awareness, Dental Status Current problems with teeth and/or dentures?: No Does patient usually wear dentures?: No  CIWA:  CIWA-Ar Total: 4 COWS:  COWS Total Score: 1  Musculoskeletal: Strength & Muscle Tone: within normal limits Gait & Station: normal Patient leans: N/A  Psychiatric Specialty Exam: Physical Exam  Nursing note and vitals reviewed. Constitutional: She appears well-developed and well-nourished.  HENT:  Head: Normocephalic and atraumatic.  Eyes: Pupils are equal, round, and reactive to light. Conjunctivae are normal.  Neck: Normal range of motion.  Cardiovascular: Regular rhythm and normal heart sounds.  Respiratory: Effort normal. No respiratory distress.  GI: Soft.  Musculoskeletal: Normal range of motion.  Neurological: She is  alert.  Skin: Skin is warm and dry.  Psychiatric: Her speech is delayed. She is slowed. She expresses impulsivity. She exhibits a depressed mood. She expresses no homicidal and no suicidal ideation. She exhibits abnormal recent memory.    Review of Systems  Constitutional: Negative.   HENT: Negative.   Eyes: Negative.   Respiratory: Negative.   Cardiovascular: Negative.   Gastrointestinal: Negative.   Musculoskeletal: Positive for joint pain.  Skin: Negative.   Neurological: Negative.   Psychiatric/Behavioral: Positive for depression and suicidal ideas. Negative for hallucinations and substance abuse. The patient is nervous/anxious and has insomnia.     Blood pressure (!) 117/57, pulse 62, temperature 98.3 F (36.8 C), temperature source Oral, resp. rate 17, height 5\' 5"  (1.651 m), weight 86.2 kg, SpO2 97 %.Body mass index is 31.62 kg/m.  General Appearance: Casual  Eye Contact:  Fair  Speech:  Slow  Volume:  Decreased  Mood:  Dysphoric  Affect:  Congruent  Thought Process:  Goal Directed  Orientation:  Full (Time, Place, and Person)  Thought Content:  Logical  Suicidal Thoughts:  No  Homicidal Thoughts:  No  Memory:  Immediate;   Fair Recent;   Fair Remote;   Fair  Judgement:  Fair  Insight:  Fair  Psychomotor Activity:  Decreased  Concentration:  Concentration: Fair  Recall:  [employer] of Knowledge:  Fair  Language:  Fair  Akathisia:  No  Handed:  Right  AIMS (if indicated):     Assets:  Desire for Improvement Housing Physical Health Social Support  ADL's:  Intact  Cognition:  WNL  Sleep:  Number  of Hours: 7.25     Treatment Plan Summary: Daily contact with patient to assess and evaluate symptoms and progress in treatment, Medication management and Plan Continue trial with low-dose lithium.  Encourage group attendance.  No other change to medicine today.  Work on possible plan for discharge by the end of the week if stable.  Mordecai Rasmussen, MD 03/25/2019, 5:03  PM

## 2019-03-25 NOTE — Progress Notes (Signed)
Recreation Therapy Notes  Date: 03/25/2019  Time: 9:30 am   Location: Craft room   Behavioral response: N/A   Intervention Topic: Coping skills  Discussion/Intervention: Patient did not attend group.   Clinical Observations/Feedback:  Patient did not attend group.   Zaylia Riolo LRT/CTRS        Nathanuel Cabreja 03/25/2019 11:15 AM

## 2019-03-25 NOTE — [facility] Group Notes (Signed)
Feelings Around Diagnosis 03/25/2019 1PM  Type of Therapy/Topic:  Group Therapy:  Feelings about Diagnosis  Participation Level:  Did Not Attend   Description of Group:   This group will allow patients to explore their thoughts and feelings about diagnoses they have received. Patients will be guided to explore their level of understanding and acceptance of these diagnoses. Facilitator will encourage patients to process their thoughts and feelings about the reactions of others to their diagnosis and will guide patients in identifying ways to discuss their diagnosis with significant others in their lives. This group will be process-oriented, with patients participating in exploration of their own experiences, giving and receiving support, and processing challenge from other group members.   Therapeutic Goals: 1. Patient will demonstrate understanding of diagnosis as evidenced by identifying two or more symptoms of the disorder 2. Patient will be able to express two feelings regarding the diagnosis 3. Patient will demonstrate their ability to communicate their needs through discussion and/or role play  Summary of Patient Progress:       Therapeutic Modalities:   Cognitive Behavioral Therapy Brief Therapy Feelings Identification    Yvette Rack, LCSW 03/25/2019 2:11 PM

## 2019-03-25 NOTE — Plan of Care (Signed)
Staff continue to educate patient on  [facility] education , unit programing, unable to verbalize  clear understanding  staff continue to   redirect for better understanding . Emotional and mental status improved  voice no concerns around sleep. Able to vent frustration and anger . Limited participation with unit programing   Denies suicidal ideations  Working on Radiographer, therapeutic ,  anxiety and decision making . Patient able to understand medications  received    Problem: Education: Goal: Knowledge of [facility] Education information/materials will improve Outcome: Progressing Goal: Emotional status will improve Outcome: Progressing Goal: Mental status will improve Outcome: Progressing Goal: Verbalization of understanding the information provided will improve Outcome: Progressing   Problem: Safety: Goal: Periods of time without injury will increase Outcome: Progressing   Problem: Education: Goal: Utilization of techniques to improve thought processes will improve Outcome: Progressing Goal: Knowledge of the prescribed therapeutic regimen will improve Outcome: Progressing   Problem: Safety: Goal: Ability to disclose and discuss suicidal ideas will improve Outcome: Progressing Goal: Ability to identify and utilize support systems that promote safety will improve Outcome: Progressing   Problem: Education: Goal: Knowledge of [facility] Education information/materials will improve Outcome: Progressing Goal: Emotional status will improve Outcome: Progressing Goal: Mental status will improve Outcome: Progressing Goal: Verbalization of understanding the information provided will improve Outcome: Progressing   Problem: Activity: Goal: Interest or engagement in activities will improve Outcome: Progressing Goal: Sleeping patterns will improve Outcome: Progressing   Problem: Coping: Goal: Ability to verbalize frustrations and anger appropriately will  improve Outcome: Progressing Goal: Ability to demonstrate self-control will improve Outcome: Progressing   Problem: Health Behavior/Discharge Planning: Goal: Identification of resources available to assist in meeting health care needs will improve Outcome: Progressing Goal: Compliance with treatment plan for underlying cause of condition will improve Outcome: Progressing   Problem: Physical Regulation: Goal: Ability to maintain clinical measurements within normal limits will improve Outcome: Progressing   Problem: Safety: Goal: Periods of time without injury will increase Outcome: Progressing   Problem: Activity: Goal: Interest or engagement in leisure activities will improve Outcome: Progressing Goal: Imbalance in normal sleep/wake cycle will improve Outcome: Progressing   Problem: Coping: Goal: Coping ability will improve Outcome: Progressing Goal: Will verbalize feelings Outcome: Progressing   Problem: Health Behavior/Discharge Planning: Goal: Ability to make decisions will improve Outcome: Progressing Goal: Compliance with therapeutic regimen will improve Outcome: Progressing   Problem: Role Relationship: Goal: Will demonstrate positive changes in social behaviors and relationships Outcome: Progressing   Problem: Self-Concept: Goal: Will verbalize positive feelings about self Outcome: Progressing Goal: Level of anxiety will decrease Outcome: Progressing   Problem: Education: Goal: Utilization of techniques to improve thought processes will improve Outcome: Progressing Goal: Knowledge of the prescribed therapeutic regimen will improve Outcome: Progressing   Problem: Activity: Goal: Interest or engagement in leisure activities will improve Outcome: Progressing Goal: Imbalance in normal sleep/wake cycle will improve Outcome: Progressing   Problem: Coping: Goal: Coping ability will improve Outcome: Progressing Goal: Will verbalize feelings Outcome:  Progressing   Problem: Health Behavior/Discharge Planning: Goal: Ability to make decisions will improve Outcome: Progressing Goal: Compliance with therapeutic regimen will improve Outcome: Progressing   Problem: Role Relationship: Goal: Will demonstrate positive changes in social behaviors and relationships Outcome: Progressing   Problem: Safety: Goal: Ability to disclose and discuss suicidal ideas will improve Outcome: Progressing Goal: Ability to identify and utilize support systems that promote safety will improve Outcome: Progressing   Problem: Self-Concept: Goal: Will  verbalize positive feelings about self Outcome: Progressing Goal: Level of anxiety will decrease Outcome: Progressing

## 2019-03-25 NOTE — Progress Notes (Signed)
D: Patient stated slept poor last night .Stated appetite fair and energy level  Is normal. Stated concentration poor . Stated on Depression scale 5 , hopeless 4 and anxiety 5 .( low 0-10 high) Denies suicidal  homicidal ideations  .  No auditory hallucinations Pain concerns . Appropriate ADL'S. Interacting with peers and staff. Staff continue to educate patient on  [facility] education , unit programing, unable to verbalize  clear understanding  staff continue to   redirect for better understanding . Emotional and mental status improved  voice no concerns around sleep. Able to vent frustration and anger . Limited participation with unit programing   Denies suicidal ideations  Working on Radiographer, therapeutic ,  anxiety and decision making . Patient able to understand medications  received  Drug seeking behavior through out shift .  A: Encourage patient participation with unit programming . Instruction  Given on  Medication , verbalize understanding. R: Voice no other concerns. Staff continue to monitor

## 2019-03-26 MED ORDER — ATORVASTATIN CALCIUM 40 MG PO TABS: 40.0000 mg | ORAL_TABLET | Freq: Every evening | ORAL | 0 refills | Status: DC

## 2019-03-26 MED ORDER — ISOSORBIDE MONONITRATE ER 30 MG PO TB24: 30.0000 mg | ORAL_TABLET | Freq: Two times a day (BID) | ORAL | 0 refills | Status: DC

## 2019-03-26 MED ORDER — ALBUTEROL SULFATE HFA 108 (90 BASE) MCG/ACT IN AERS: 2.0000 | INHALATION_SPRAY | Freq: Four times a day (QID) | RESPIRATORY_TRACT | 0 refills | Status: DC | PRN

## 2019-03-26 MED ORDER — FLUOXETINE HCL 20 MG PO CAPS: 60.0000 mg | ORAL_CAPSULE | Freq: Every day | ORAL | 0 refills | Status: DC

## 2019-03-26 MED ORDER — METHOTREXATE 2.5 MG PO TABS: 2.5000 mg | ORAL_TABLET | Freq: Once | ORAL | Status: DC

## 2019-03-26 MED ORDER — METOPROLOL TARTRATE 25 MG PO TABS: 25.0000 mg | ORAL_TABLET | Freq: Two times a day (BID) | ORAL | 0 refills | Status: DC

## 2019-03-26 MED ORDER — LISINOPRIL 20 MG PO TABS: 20.0000 mg | ORAL_TABLET | Freq: Every day | ORAL | 0 refills | Status: DC

## 2019-03-26 MED ORDER — LISINOPRIL 20 MG PO TABS
20.0000 mg | ORAL_TABLET | Freq: Every day | ORAL | Status: DC
  Administered 2019-03-27: 20 mg via ORAL
  Filled 2019-03-26: qty 1

## 2019-03-26 MED ORDER — ASPIRIN EC 81 MG PO TBEC: 81.0000 mg | DELAYED_RELEASE_TABLET | Freq: Every day | ORAL | 0 refills | Status: DC

## 2019-03-26 MED ORDER — TRAZODONE HCL 100 MG PO TABS
100.0000 mg | ORAL_TABLET | Freq: Every day | ORAL | Status: DC
  Administered 2019-03-26: 100 mg via ORAL
  Filled 2019-03-26: qty 1

## 2019-03-26 MED ORDER — TRAZODONE HCL 100 MG PO TABS: 100.0000 mg | ORAL_TABLET | Freq: Every day | ORAL | 0 refills | Status: DC

## 2019-03-26 MED ORDER — PANTOPRAZOLE SODIUM 40 MG PO TBEC: 40.0000 mg | DELAYED_RELEASE_TABLET | Freq: Every day | ORAL | 0 refills | Status: DC

## 2019-03-26 MED ORDER — METHOTREXATE 2.5 MG PO TABS
15.0000 mg | ORAL_TABLET | Freq: Once | ORAL | Status: DC
  Filled 2019-03-26: qty 6

## 2019-03-26 MED ORDER — LITHIUM CARBONATE 300 MG PO CAPS
300.0000 mg | ORAL_CAPSULE | Freq: Every day | ORAL | Status: DC
  Administered 2019-03-26: 21:00:00 300 mg via ORAL
  Filled 2019-03-26 (×2): qty 1

## 2019-03-26 MED ORDER — LITHIUM CARBONATE 300 MG PO CAPS: 300.0000 mg | ORAL_CAPSULE | Freq: Every day | ORAL | 0 refills | Status: DC

## 2019-03-26 NOTE — Progress Notes (Signed)
D:Encourage  To Work on Radiographer, therapeutic ,  anxiety and decision making . Staff continue to educate patient on  [facility] education , unit programing, unable to verbalize  clear understanding  staff continue to   redirect for better understanding . Emotional and mental status improved  . Able to vent frustration and anger .Denies suicidal ideations   . Patient able to understand medications  received Patient stated slept poor last night .Stated appetite is fair  and energy level  Is normal. Stated concentration ipoor. Stated on Depression scale , hopeless and anxiety .( low 0-10 high) Denies suicidal  homicidal ideations  .  No auditory hallucinations  No pain concerns . Appropriate ADL'S. Interacting with peers and staff.  Patient not attending unit programing  Liying in bed  During shift   A: Encourage patient participation with unit programming . Instruction  Given on  Medication , verbalize understanding.  R: Voice no other concerns. Staff continue to monitor

## 2019-03-26 NOTE — Progress Notes (Signed)
[facility] MD Progress Note  03/26/2019 5:44 PM Teresa Lang  MRN:  [id number] Subjective: Patient seen chart reviewed.  Patient reports she is still feeling very anxious and that she had panic attacks last night.  She denies any suicidal thought however.  Feels like maybe her depression is a little bit better.  Patient reminded me that she needed to get her methotrexate which is once a week and missed it this week.  Also requesting something to "sleep" tonight. Principal Problem: MDD (major depressive disorder), recurrent, severe, with psychosis (HCC) Diagnosis: Principal Problem:   MDD (major depressive disorder), recurrent, severe, with psychosis (HCC) Active Problems:   PTSD (post-traumatic stress disorder)   Fibromyalgia   Hyperlipemia, mixed   Hypertension, essential   Alcohol abuse  Total Time spent with patient: 30 minutes  Past Psychiatric History: Patient has a history of recurrent severe depression and a history of substance abuse  Past Medical History:  Past Medical History:  Diagnosis Date  . Allergy    seasonal  . Anxiety   . Arthritis   . Depression   . Hyperlipidemia   . Hypertension     Past Surgical History:  Procedure Laterality Date  . ABDOMINAL HYSTERECTOMY  1983  . CHOLECYSTECTOMY  1983  . SPINE SURGERY  beginning 1994   laminectomies, fusions   Family History:  Family History  Problem Relation Age of Onset  . Celiac disease Sister   . Breast cancer Neg Hx    Family Psychiatric  History: See previous Social History:  Social History   Substance and Sexual Activity  Alcohol Use Never  . Frequency: Never     Social History   Substance and Sexual Activity  Drug Use Yes  . Frequency: 7.0 times per week  . Types: Marijuana   Comment: "maybe once in the evening"; also uses "KRATUM, the herb, to helps with pain and anxiety"    Social History   Socioeconomic History  . Marital status: Widowed    Spouse name: Not on file  . Number of  children: Not on file  . Years of education: Not on file  . Highest education level: Not on file  Occupational History  . Occupation: disability  Social Needs  . Financial resource strain: Not on file  . Food insecurity    Worry: Not on file    Inability: Not on file  . Transportation needs    Medical: Not on file    Non-medical: Not on file  Tobacco Use  . Smoking status: Current Every Day Smoker    Packs/day: 0.25    Years: 57.00    Pack years: 14.25    Types: Cigarettes    Start date: 38  . Smokeless tobacco: Never Used  . Tobacco comment: Also VAPES on a daily basis  Substance and Sexual Activity  . Alcohol use: Never    Frequency: Never  . Drug use: Yes    Frequency: 7.0 times per week    Types: Marijuana    Comment: "maybe once in the evening"; also uses "KRATUM, the herb, to helps with pain and anxiety"  . Sexual activity: Not on file  Lifestyle  . Physical activity    Days per week: Not on file    Minutes per session: Not on file  . Stress: Not on file  Relationships  . Social Musician on phone: Not on file    Gets together: Not on file    Attends religious service:  Not on file    Active member of club or organization: Not on file    Attends meetings of clubs or organizations: Not on file    Relationship status: Not on file  Other Topics Concern  . Not on file  Social History Narrative   Lives with sister   Additional Social History:                         Sleep: Poor  Appetite:  Fair  Current Medications: Current Facility-Administered Medications  Medication Dose Route Frequency Provider Last Rate Last Dose  . acetaminophen (TYLENOL) tablet 650 mg  650 mg Oral Q6H PRN Lavella Hammock, MD      . albuterol (PROVENTIL) (2.5 MG/3ML) 0.083% nebulizer solution 2.5 mg  2.5 mg Inhalation Q6H PRN Lavella Hammock, MD      . alum & mag hydroxide-simeth (MAALOX/MYLANTA) 200-200-20 MG/5ML suspension 30 mL  30 mL Oral Q4H PRN Lavella Hammock, MD      . aspirin EC tablet 81 mg  81 mg Oral Daily Lavella Hammock, MD   81 mg at 03/26/19 0758  . atorvastatin (LIPITOR) tablet 40 mg  40 mg Oral QPM Lavella Hammock, MD   40 mg at 03/26/19 1721  . clonazePAM (KLONOPIN) tablet 1 mg  1 mg Oral BID PRN Sharma Covert, MD   1 mg at 03/26/19 0737  . cyclobenzaprine (FLEXERIL) tablet 5 mg  5 mg Oral TID PRN Amaurie Schreckengost T, MD      . FLUoxetine (PROZAC) capsule 60 mg  60 mg Oral Daily Lavella Hammock, MD   60 mg at 03/26/19 0758  . fluticasone furoate-vilanterol (BREO ELLIPTA) 100-25 MCG/INH 1 puff  1 puff Inhalation Daily Lavella Hammock, MD   1 puff at 03/26/19 0802  . folic acid (FOLVITE) tablet 1 mg  1 mg Oral Daily Lavella Hammock, MD   1 mg at 03/26/19 0759  . hydrOXYzine (ATARAX/VISTARIL) tablet 50 mg  50 mg Oral TID PRN Lavella Hammock, MD   50 mg at 03/26/19 0120  . ibuprofen (ADVIL) tablet 200 mg  200 mg Oral Q6H PRN Lavella Hammock, MD   200 mg at 03/25/19 1110  . isosorbide mononitrate (IMDUR) 24 hr tablet 30 mg  30 mg Oral BID Lavella Hammock, MD   30 mg at 03/26/19 1726  . [START ON 03/27/2019] lisinopril (ZESTRIL) tablet 20 mg  20 mg Oral Daily Chaston Bradburn T, MD      . lithium carbonate capsule 300 mg  300 mg Oral QHS Laterrica Libman T, MD      . magnesium hydroxide (MILK OF MAGNESIA) suspension 30 mL  30 mL Oral Daily PRN Lavella Hammock, MD      . methotrexate (RHEUMATREX) tablet 2.5 mg  2.5 mg Oral Once Dalary Hollar T, MD      . metoprolol tartrate (LOPRESSOR) tablet 25 mg  25 mg Oral BID Lavella Hammock, MD   25 mg at 03/26/19 1721  . nicotine (NICODERM CQ - dosed in mg/24 hours) patch 14 mg  14 mg Transdermal Daily Antoinett Dorman, Madie Reno, MD   14 mg at 03/26/19 0806  . olopatadine (PATANOL) 0.1 % ophthalmic solution 1 drop  1 drop Both Eyes BID Lavella Hammock, MD   1 drop at 03/24/19 0845  . oxyCODONE (Oxy IR/ROXICODONE) immediate release tablet 5 mg  5 mg Oral Q6H PRN Myles Lipps  Hart RochesterLawson, MD   5 mg at 03/26/19  1418  . pantoprazole (PROTONIX) EC tablet 40 mg  40 mg Oral Daily Mariel CraftMaurer, Sheila M, MD   40 mg at 03/26/19 0800  . traZODone (DESYREL) tablet 100 mg  100 mg Oral QHS Cerinity Zynda T, MD      . umeclidinium bromide (INCRUSE ELLIPTA) 62.5 MCG/INH 1 puff  1 puff Inhalation Daily Mariel CraftMaurer, Sheila M, MD   1 puff at 03/26/19 0801    Lab Results: No results found for this or any previous visit (from the past 48 hour(s)).  Blood Alcohol level:  Lab Results  Component Value Date   ETH <10 03/21/2019   ETH <10 03/15/2019    Metabolic Disorder Labs: Lab Results  Component Value Date   HGBA1C 5.5 03/21/2019   MPG 111.15 03/21/2019   No results found for: PROLACTIN Lab Results  Component Value Date   CHOL 145 03/21/2019   TRIG 90 03/21/2019   HDL 55 03/21/2019   CHOLHDL 2.6 03/21/2019   VLDL 18 03/21/2019   LDLCALC 72 03/21/2019    Physical Findings: AIMS: Facial and Oral Movements Muscles of Facial Expression: None, normal Lips and Perioral Area: None, normal Jaw: None, normal Tongue: None, normal,Extremity Movements Upper (arms, wrists, hands, fingers): None, normal Lower (legs, knees, ankles, toes): None, normal, Trunk Movements Neck, shoulders, hips: None, normal, Overall Severity Severity of abnormal movements (highest score from questions above): None, normal Incapacitation due to abnormal movements: None, normal Patient's awareness of abnormal movements (rate only patient's report): No Awareness, Dental Status Current problems with teeth and/or dentures?: No Does patient usually wear dentures?: No  CIWA:  CIWA-Ar Total: 4 COWS:  COWS Total Score: 1  Musculoskeletal: Strength & Muscle Tone: within normal limits Gait & Station: normal Patient leans: N/A  Psychiatric Specialty Exam: Physical Exam  Nursing note and vitals reviewed. Constitutional: She appears well-developed and well-nourished.  HENT:  Head: Normocephalic and atraumatic.  Eyes: Pupils are equal, round,  and reactive to light. Conjunctivae are normal.  Neck: Normal range of motion.  Cardiovascular: Regular rhythm and normal heart sounds.  Respiratory: Effort normal.  GI: Soft.  Musculoskeletal: Normal range of motion.  Neurological: She is alert.  Skin: Skin is warm and dry.  Psychiatric: Judgment normal. Her mood appears anxious. Her speech is delayed. She is slowed. Thought content is not paranoid. Cognition and memory are normal. She exhibits a depressed mood. She expresses no homicidal and no suicidal ideation.    Review of Systems  Constitutional: Negative.   HENT: Negative.   Eyes: Negative.   Respiratory: Negative.   Cardiovascular: Negative.   Gastrointestinal: Negative.   Musculoskeletal: Negative.   Skin: Negative.   Neurological: Negative.   Psychiatric/Behavioral: Positive for depression. Negative for hallucinations, substance abuse and suicidal ideas. The patient is nervous/anxious and has insomnia.     Blood pressure 131/75, pulse 63, temperature 98.6 F (37 C), temperature source Oral, resp. rate 16, height 5\' 5"  (1.651 m), weight 86.2 kg, SpO2 96 %.Body mass index is 31.62 kg/m.  General Appearance: Casual  Eye Contact:  Fair  Speech:  Slow  Volume:  Decreased  Mood:  Anxious and Dysphoric  Affect:  Congruent  Thought Process:  Goal Directed  Orientation:  Full (Time, Place, and Person)  Thought Content:  Logical  Suicidal Thoughts:  No  Homicidal Thoughts:  No  Memory:  Immediate;   Fair Recent;   Fair Remote;   Fair  Judgement:  Fair  Insight:  Fair  Psychomotor Activity:  Decreased  Concentration:  Concentration: Fair  Recall:  [employer]Fair  Fund of Knowledge:  Fair  Language:  Fair  Akathisia:  No  Handed:  Right  AIMS (if indicated):     Assets:  Desire for Improvement Housing  ADL's:  Intact  Cognition:  WNL  Sleep:  Number of Hours: 6     Treatment Plan Summary: Daily contact with patient to assess and evaluate symptoms and progress in  treatment, Medication management and Plan Congratulated patient on tolerating medicine so far.  We will check her lithium level tomorrow.  She is not showing any signs of lithium toxicity so I am optimistic that it will not be bad.  Meanwhile I will give her her one-time dose of methotrexate also trazodone for sleep tonight.  I think we are going to try to plan for discharge tomorrow if possible.  Mordecai RasmussenJohn Ermalee Mealy, MD 03/26/2019, 5:44 PM

## 2019-03-26 NOTE — [facility] Suicide Risk Assessment (Signed)
[facility] Discharge Suicide Risk Assessment   Principal Problem: MDD (major depressive disorder), recurrent, severe, with psychosis ([place]) Discharge Diagnoses: Principal Problem:   MDD (major depressive disorder), recurrent, severe, with psychosis ([place]) Active Problems:   PTSD (post-traumatic stress disorder)   Fibromyalgia   Hyperlipemia, mixed   Hypertension, essential   Alcohol abuse   Total Time spent with patient: 45 minutes  Musculoskeletal: Strength & Muscle Tone: within normal limits Gait & Station: normal Patient leans: N/A  Psychiatric Specialty Exam: Review of Systems  Constitutional: Negative.   HENT: Negative.   Eyes: Negative.   Respiratory: Negative.   Cardiovascular: Negative.   Gastrointestinal: Negative.   Musculoskeletal: Negative.   Skin: Negative.   Neurological: Negative.   Psychiatric/Behavioral: Positive for depression. Negative for hallucinations, substance abuse and suicidal ideas. The patient is nervous/anxious and has insomnia.     Blood pressure 131/75, pulse 63, temperature 98.6 F (37 C), temperature source Oral, resp. rate 16, height 5\' 5"  (1.651 m), weight 86.2 kg, SpO2 96 %.Body mass index is 31.62 kg/m.  General Appearance: Casual  Eye Contact::  Fair  Speech:  Normal Rate409  Volume:  Normal  Mood:  Dysphoric  Affect:  Congruent  Thought Process:  Goal Directed  Orientation:  Full (Time, Place, and Person)  Thought Content:  Logical  Suicidal Thoughts:  No  Homicidal Thoughts:  No  Memory:  Immediate;   Fair Recent;   Fair Remote;   Fair  Judgement:  Fair  Insight:  Fair  Psychomotor Activity:  Decreased  Concentration:  Fair  Recall:  [employer] of Knowledge:Fair  Language: Fair  Akathisia:  No  Handed:  Right  AIMS (if indicated):     Assets:  Desire for Improvement Housing  Sleep:  Number of Hours: 6  Cognition: WNL  ADL's:  Intact   Mental Status Per Nursing Assessment::   On Admission:  NA  Demographic Factors:   Caucasian  Loss Factors: Financial problems/change in socioeconomic status  Historical Factors: Impulsivity  Risk Reduction Factors:   Living with another person, especially a relative, Positive social support and Positive therapeutic relationship  Continued Clinical Symptoms:  Depression:   Anhedonia  Cognitive Features That Contribute To Risk:  Closed-mindedness    Suicide Risk:  Minimal: No identifiable suicidal ideation.  Patients presenting with no risk factors but with morbid ruminations; may be classified as minimal risk based on the severity of the depressive symptoms  Follow-up Information    [facility] Follow up on 03/27/2019.   Why: Please follow up with [facility] on, Thursday, June 18th at 12:30pm.  Please take your hospital discharge paperwork with you to your appointment.  Thank you. Contact information: [place] [address] [phone]           Plan Of Care/Follow-up recommendations:  Activity:  Activity as tolerated Diet:  Regular diet Other:  Follow-up with outpatient psychiatric care in the community with [facility].  Alethia Berthold, MD 03/26/2019, 5:47 PM

## 2019-03-26 NOTE — Progress Notes (Signed)
Recreation Therapy Notes    Date: 03/26/2019   Time: 9:30 am   Location: Craft room   Behavioral response: N/A   Intervention Topic: Anger Management   Discussion/Intervention: Patient did not attend group.   Clinical Observations/Feedback:  Patient did not attend group.   Suliman Termini LRT/CTRS          Donat Humble 03/26/2019 10:35 AM 

## 2019-03-26 NOTE — Plan of Care (Signed)
Working on Radiographer, therapeutic ,  anxiety and decision making . Staff continue to educate patient on  [facility] education , unit programing, unable to verbalize  clear understanding  staff continue to   redirect for better understanding . Emotional and mental status improved  voice no concerns around sleep. Able to vent frustration and anger . Limited participation with unit programing   Denies suicidal ideations   . Patient able to understand medications  received    Problem: Education: Goal: Knowledge of [facility] Education information/materials will improve Outcome: Progressing Goal: Emotional status will improve Outcome: Progressing Goal: Mental status will improve Outcome: Progressing Goal: Verbalization of understanding the information provided will improve Outcome: Progressing   Problem: Safety: Goal: Periods of time without injury will increase Outcome: Progressing   Problem: Education: Goal: Utilization of techniques to improve thought processes will improve Outcome: Progressing Goal: Knowledge of the prescribed therapeutic regimen will improve Outcome: Progressing   Problem: Safety: Goal: Ability to disclose and discuss suicidal ideas will improve Outcome: Progressing Goal: Ability to identify and utilize support systems that promote safety will improve Outcome: Progressing   Problem: Education: Goal: Knowledge of [facility] Education information/materials will improve Outcome: Progressing Goal: Emotional status will improve Outcome: Progressing Goal: Mental status will improve Outcome: Progressing Goal: Verbalization of understanding the information provided will improve Outcome: Progressing   Problem: Activity: Goal: Interest or engagement in activities will improve Outcome: Progressing Goal: Sleeping patterns will improve Outcome: Progressing   Problem: Coping: Goal: Ability to verbalize frustrations and anger appropriately will  improve Outcome: Progressing Goal: Ability to demonstrate self-control will improve Outcome: Progressing   Problem: Health Behavior/Discharge Planning: Goal: Identification of resources available to assist in meeting health care needs will improve Outcome: Progressing Goal: Compliance with treatment plan for underlying cause of condition will improve Outcome: Progressing   Problem: Physical Regulation: Goal: Ability to maintain clinical measurements within normal limits will improve Outcome: Progressing   Problem: Safety: Goal: Periods of time without injury will increase Outcome: Progressing   Problem: Activity: Goal: Interest or engagement in leisure activities will improve Outcome: Progressing Goal: Imbalance in normal sleep/wake cycle will improve Outcome: Progressing   Problem: Coping: Goal: Coping ability will improve Outcome: Progressing Goal: Will verbalize feelings Outcome: Progressing   Problem: Health Behavior/Discharge Planning: Goal: Ability to make decisions will improve Outcome: Progressing Goal: Compliance with therapeutic regimen will improve Outcome: Progressing   Problem: Role Relationship: Goal: Will demonstrate positive changes in social behaviors and relationships Outcome: Progressing   Problem: Self-Concept: Goal: Will verbalize positive feelings about self Outcome: Progressing Goal: Level of anxiety will decrease Outcome: Progressing   Problem: Education: Goal: Utilization of techniques to improve thought processes will improve Outcome: Progressing Goal: Knowledge of the prescribed therapeutic regimen will improve Outcome: Progressing   Problem: Activity: Goal: Interest or engagement in leisure activities will improve Outcome: Progressing Goal: Imbalance in normal sleep/wake cycle will improve Outcome: Progressing   Problem: Coping: Goal: Coping ability will improve Outcome: Progressing Goal: Will verbalize feelings Outcome:  Progressing   Problem: Health Behavior/Discharge Planning: Goal: Ability to make decisions will improve Outcome: Progressing Goal: Compliance with therapeutic regimen will improve Outcome: Progressing   Problem: Role Relationship: Goal: Will demonstrate positive changes in social behaviors and relationships Outcome: Progressing   Problem: Safety: Goal: Ability to disclose and discuss suicidal ideas will improve Outcome: Progressing Goal: Ability to identify and utilize support systems that promote safety will improve Outcome: Progressing   Problem: Self-Concept:  Goal: Will verbalize positive feelings about self Outcome: Progressing Goal: Level of anxiety will decrease Outcome: Progressing

## 2019-03-26 NOTE — Plan of Care (Signed)
Patient is complaint with her medications and drug seeking at times. Patient came up at 0100 stating, "is there anything I can have?" Patient given education.   Problem: Education: Goal: Emotional status will improve Outcome: Not Progressing Goal: Mental status will improve Outcome: Not Progressing

## 2019-03-26 NOTE — [facility] Group Notes (Signed)
LCSW Group Therapy Note  03/26/2019 1:00 PM  Type of Therapy/Topic:  Group Therapy:  Emotion Regulation  Participation Level:  Did Not Attend   Description of Group:   The purpose of this group is to assist patients in learning to regulate negative emotions and experience positive emotions. Patients will be guided to discuss ways in which they have been vulnerable to their negative emotions. These vulnerabilities will be juxtaposed with experiences of positive emotions or situations, and patients will be challenged to use positive emotions to combat negative ones. Special emphasis will be placed on coping with negative emotions in conflict situations, and patients will process healthy conflict resolution skills.  Therapeutic Goals: 1. Patient will identify two positive emotions or experiences to reflect on in order to balance out negative emotions 2. Patient will label two or more emotions that they find the most difficult to experience 3. Patient will demonstrate positive conflict resolution skills through discussion and/or role plays  Summary of Patient Progress: X  Therapeutic Modalities:   Cognitive Behavioral Therapy Feelings Identification Dialectical Behavioral Therapy  Assunta Curtis, MSW, LCSW 03/26/2019 12:52 PM

## 2019-03-26 NOTE — Tx Team (Signed)
Interdisciplinary Treatment and Diagnostic Plan Update  03/26/2019 Time of Session: 830AM Teresa Lang MRN: [id number]  Principal Diagnosis: MDD (major depressive disorder), recurrent, severe, with psychosis (HCC)  Secondary Diagnoses: Principal Problem:   MDD (major depressive disorder), recurrent, severe, with psychosis (HCC) Active Problems:   PTSD (post-traumatic stress disorder)   Fibromyalgia   Hyperlipemia, mixed   Hypertension, essential   Alcohol abuse   Current Medications:  Current Facility-Administered Medications  Medication Dose Route Frequency Provider Last Rate Last Dose  . acetaminophen (TYLENOL) tablet 650 mg  650 mg Oral Q6H PRN Mariel Craft, MD      . albuterol (PROVENTIL) (2.5 MG/3ML) 0.083% nebulizer solution 2.5 mg  2.5 mg Inhalation Q6H PRN Mariel Craft, MD      . alum & mag hydroxide-simeth (MAALOX/MYLANTA) 200-200-20 MG/5ML suspension 30 mL  30 mL Oral Q4H PRN Mariel Craft, MD      . aspirin EC tablet 81 mg  81 mg Oral Daily Mariel Craft, MD   81 mg at 03/26/19 0758  . atorvastatin (LIPITOR) tablet 40 mg  40 mg Oral QPM Mariel Craft, MD   40 mg at 03/25/19 1740  . clonazePAM (KLONOPIN) tablet 1 mg  1 mg Oral BID PRN Antonieta Pert, MD   1 mg at 03/26/19 5366  . cyclobenzaprine (FLEXERIL) tablet 5 mg  5 mg Oral TID PRN Clapacs, John T, MD      . FLUoxetine (PROZAC) capsule 60 mg  60 mg Oral Daily Mariel Craft, MD   60 mg at 03/26/19 0758  . fluticasone furoate-vilanterol (BREO ELLIPTA) 100-25 MCG/INH 1 puff  1 puff Inhalation Daily Mariel Craft, MD   1 puff at 03/26/19 0802  . folic acid (FOLVITE) tablet 1 mg  1 mg Oral Daily Mariel Craft, MD   1 mg at 03/26/19 0759  . hydrOXYzine (ATARAX/VISTARIL) tablet 50 mg  50 mg Oral TID PRN Mariel Craft, MD   50 mg at 03/26/19 0120  . ibuprofen (ADVIL) tablet 200 mg  200 mg Oral Q6H PRN Mariel Craft, MD   200 mg at 03/25/19 1110  . isosorbide mononitrate (IMDUR) 24  hr tablet 30 mg  30 mg Oral BID Mariel Craft, MD   30 mg at 03/26/19 0800  . lisinopril (ZESTRIL) tablet 10 mg  10 mg Oral Daily Antonieta Pert, MD   10 mg at 03/26/19 0758  . lithium carbonate capsule 300 mg  300 mg Oral QHS Clapacs, John T, MD   300 mg at 03/25/19 2224  . magnesium hydroxide (MILK OF MAGNESIA) suspension 30 mL  30 mL Oral Daily PRN Mariel Craft, MD      . metoprolol tartrate (LOPRESSOR) tablet 25 mg  25 mg Oral BID Mariel Craft, MD   25 mg at 03/26/19 0759  . nicotine (NICODERM CQ - dosed in mg/24 hours) patch 14 mg  14 mg Transdermal Daily Clapacs, Jackquline Denmark, MD   14 mg at 03/26/19 0806  . olopatadine (PATANOL) 0.1 % ophthalmic solution 1 drop  1 drop Both Eyes BID Mariel Craft, MD   1 drop at 03/24/19 0845  . oxyCODONE (Oxy IR/ROXICODONE) immediate release tablet 5 mg  5 mg Oral Q6H PRN Antonieta Pert, MD   5 mg at 03/26/19 0815  . pantoprazole (PROTONIX) EC tablet 40 mg  40 mg Oral Daily Mariel Craft, MD   40 mg at 03/26/19 0800  .  umeclidinium bromide (INCRUSE ELLIPTA) 62.5 MCG/INH 1 puff  1 puff Inhalation Daily Lavella Hammock, MD   1 puff at 03/26/19 0801   PTA Medications: Medications Prior to Admission  Medication Sig Dispense Refill Last Dose  . albuterol (PROVENTIL HFA;VENTOLIN HFA) 108 (90 Base) MCG/ACT inhaler Inhale 2 puffs into the lungs every 6 (six) hours as needed for wheezing or shortness of breath. 1 Inhaler 1   . aspirin EC 81 MG tablet Take 81 mg by mouth daily.     [name] Kitchen atorvastatin (LIPITOR) 40 MG tablet Take 1 tablet (40 mg total) by mouth every evening. 30 tablet 1   . Brexpiprazole 3 MG TABS Take 3 mg by mouth at bedtime. 30 tablet 1   . busPIRone (BUSPAR) 15 MG tablet Take 1 tablet (15 mg total) by mouth 3 (three) times daily. 90 tablet 1   . clonazePAM (KLONOPIN) 1 MG tablet Take 1 mg by mouth 2 (two) times daily as needed for anxiety.     . diclofenac sodium (VOLTAREN) 1 % GEL Apply 2 g topically 4 (four) times daily as  needed (pain).     [name] Kitchen FLUoxetine (PROZAC) 20 MG capsule Take 3 capsules (60 mg total) by mouth daily. 90 capsule 1   . fluticasone furoate-vilanterol (BREO ELLIPTA) 100-25 MCG/INH AEPB Inhale 1 puff into the lungs daily. (Patient not taking: Reported on 03/20/2019) 1 each 1   . folic acid (FOLVITE) 1 MG tablet Take 1 mg by mouth daily.     [name] Kitchen gabapentin (NEURONTIN) 300 MG capsule Take 300 mg by mouth 3 (three) times daily.     [name] Kitchen ibuprofen (ADVIL,MOTRIN) 200 MG tablet Take 200 mg by mouth every 6 (six) hours as needed for mild pain.      . isosorbide mononitrate (IMDUR) 30 MG 24 hr tablet Take 1 tablet (30 mg total) by mouth 2 (two) times daily. (Patient taking differently: Take 15 mg by mouth 2 (two) times daily. ) 60 tablet 1   . lisinopril-hydrochlorothiazide (ZESTORETIC) 20-12.5 MG tablet Take 1 tablet by mouth 2 (two) times a day.     . methotrexate (RHEUMATREX) 2.5 MG tablet Take 15 mg by mouth once a week.      . metoprolol tartrate (LOPRESSOR) 25 MG tablet Take 1 tablet (25 mg total) by mouth 2 (two) times daily. (Patient not taking: Reported on 03/20/2019) 60 tablet 1   . mirtazapine (REMERON) 30 MG tablet Take 0.5 tablets (15 mg total) by mouth at bedtime. 30 tablet 1   . olopatadine (PATANOL) 0.1 % ophthalmic solution Place 1 drop into both eyes 2 (two) times daily. 5 mL 1   . omeprazole (PRILOSEC) 40 MG capsule Take 40 mg by mouth daily.     [name] Kitchen oxyCODONE (OXY IR/ROXICODONE) 5 MG immediate release tablet Take 5 mg by mouth 3 (three) times daily.     . pantoprazole (PROTONIX) 40 MG tablet Take 1 tablet (40 mg total) by mouth daily. 30 tablet 1   . tiZANidine (ZANAFLEX) 4 MG tablet Take 4 mg by mouth 3 (three) times daily.      . TRELEGY ELLIPTA 100-62.5-25 MCG/INH AEPB Inhale 1 puff into the lungs daily.     [name] Kitchen umeclidinium bromide (INCRUSE ELLIPTA) 62.5 MCG/INH AEPB Inhale 1 puff into the lungs daily. (Patient not taking: Reported on 03/20/2019) 1 each 1     Patient Stressors: Medication  change or noncompliance  Patient Strengths: Curator fund of knowledge Motivation for treatment/growth  Treatment Modalities: Medication  Management, Group therapy, Case management,  1 to 1 session with clinician, Psychoeducation, Recreational therapy.   Physician Treatment Plan for Primary Diagnosis: MDD (major depressive disorder), recurrent, severe, with psychosis (HCC) Long Term Goal(s): Improvement in symptoms so as ready for discharge Improvement in symptoms so as ready for discharge   Short Term Goals: Ability to disclose and discuss suicidal ideas Ability to demonstrate self-control will improve Compliance with prescribed medications will improve  Medication Management: Evaluate patient's response, side effects, and tolerance of medication regimen.  Therapeutic Interventions: 1 to 1 sessions, Unit Group sessions and Medication administration.  Evaluation of Outcomes: Progressing  Physician Treatment Plan for Secondary Diagnosis: Principal Problem:   MDD (major depressive disorder), recurrent, severe, with psychosis (HCC) Active Problems:   PTSD (post-traumatic stress disorder)   Fibromyalgia   Hyperlipemia, mixed   Hypertension, essential   Alcohol abuse  Long Term Goal(s): Improvement in symptoms so as ready for discharge Improvement in symptoms so as ready for discharge   Short Term Goals: Ability to disclose and discuss suicidal ideas Ability to demonstrate self-control will improve Compliance with prescribed medications will improve     Medication Management: Evaluate patient's response, side effects, and tolerance of medication regimen.  Therapeutic Interventions: 1 to 1 sessions, Unit Group sessions and Medication administration.  Evaluation of Outcomes: Progressing   RN Treatment Plan for Primary Diagnosis: MDD (major depressive disorder), recurrent, severe, with psychosis (HCC) Long Term Goal(s): Knowledge of disease and therapeutic  regimen to maintain health will improve  Short Term Goals: Ability to verbalize feelings will improve and Ability to identify and develop effective coping behaviors will improve  Medication Management: RN will administer medications as ordered by provider, will assess and evaluate patient's response and provide education to patient for prescribed medication. RN will report any adverse and/or side effects to prescribing provider.  Therapeutic Interventions: 1 on 1 counseling sessions, Psychoeducation, Medication administration, Evaluate responses to treatment, Monitor vital signs and CBGs as ordered, Perform/monitor CIWA, COWS, AIMS and Fall Risk screenings as ordered, Perform wound care treatments as ordered.  Evaluation of Outcomes: Progressing   LCSW Treatment Plan for Primary Diagnosis: MDD (major depressive disorder), recurrent, severe, with psychosis (HCC) Long Term Goal(s): Safe transition to appropriate next level of care at discharge, Engage patient in therapeutic group addressing interpersonal concerns.  Short Term Goals: Engage patient in aftercare planning with referrals and resources, Increase social support, Increase ability to appropriately verbalize feelings, Increase emotional regulation, Facilitate acceptance of mental health diagnosis and concerns and Increase skills for wellness and recovery  Therapeutic Interventions: Assess for all discharge needs, 1 to 1 time with Social worker, Explore available resources and support systems, Assess for adequacy in community support network, Educate family and significant other(s) on suicide prevention, Complete Psychosocial Assessment, Interpersonal group therapy.  Evaluation of Outcomes: Progressing   Progress in Treatment: Attending groups: No. Participating in groups: No. Taking medication as prescribed: Yes. Toleration medication: Yes. Family/Significant other contact made: Yes, individual(s) contacted:  pts sister Patient  understands diagnosis: Yes. Discussing patient identified problems/goals with staff: Yes. Medical problems stabilized or resolved: Yes. Denies suicidal/homicidal ideation: Yes. Issues/concerns per patient self-inventory: No. Other: N/A  New problem(s) identified: No, Describe:  none  New Short Term/Long Term Goal(s): medication management for mood stabilization; development of comprehensive mental wellness/sobriety plan.   Patient Goals:  "I want to feel better and be more in control"  Discharge Plan or Barriers: SPE pamphlet, Mobile Crisis information, and AA/NA information provided to patient for  additional community support and resources. [facility] and has an appointment scheduled for 03/27/19 at 12:30.  Reason for Continuation of Hospitalization: Anxiety Depression Medication stabilization  Estimated Length of Stay: 1-2 days   Recreational Therapy: Patient Stressors: N/A  Patient Goal: Patient will identify 3 positive coping skills strategies to use for anxiety post d/c within 5 recreation therapy group sessions  Attendees: Patient: Teresa Lang 03/26/2019 9:31 AM  Physician:  03/26/2019 9:31 AM  Nursing:  03/26/2019 9:31 AM  RN Care Manager: 03/26/2019 9:31 AM  Social Worker: Zollie Scalelivia Desa Rech LCSW 03/26/2019 9:31 AM  Recreational Therapist:  03/26/2019 9:31 AM  Other:  03/26/2019 9:31 AM  Other:  03/26/2019 9:31 AM  Other: 03/26/2019 9:31 AM    Scribe for Treatment Team: Charlann Langelivia K Kayvon Mo, LCSW 03/26/2019 9:31 AM

## 2019-03-27 LAB — LITHIUM LEVEL: Lithium Lvl: 0.3 mmol/L — ABNORMAL LOW (ref 0.60–1.20)

## 2019-03-27 MED ORDER — UMECLIDINIUM BROMIDE 62.5 MCG/INH IN AEPB: 1.0000 | INHALATION_SPRAY | Freq: Every day | RESPIRATORY_TRACT | 1 refills | Status: DC

## 2019-03-27 MED ORDER — FLUTICASONE FUROATE-VILANTEROL 100-25 MCG/INH IN AEPB: 1.0000 | INHALATION_SPRAY | Freq: Every day | RESPIRATORY_TRACT | 1 refills | Status: DC

## 2019-03-27 MED ORDER — CLONAZEPAM 1 MG PO TABS: 1.0000 mg | ORAL_TABLET | Freq: Two times a day (BID) | ORAL | 1 refills | Status: DC | PRN

## 2019-03-27 MED ORDER — METHOTREXATE 2.5 MG PO TABS
15.0000 mg | ORAL_TABLET | Freq: Once | ORAL | Status: DC
  Filled 2019-03-27: qty 6

## 2019-03-27 MED ORDER — TRAZODONE HCL 100 MG PO TABS: 100.0000 mg | ORAL_TABLET | Freq: Every day | ORAL | 1 refills | Status: DC

## 2019-03-27 MED ORDER — ALBUTEROL SULFATE HFA 108 (90 BASE) MCG/ACT IN AERS: 2.0000 | INHALATION_SPRAY | Freq: Four times a day (QID) | RESPIRATORY_TRACT | 1 refills | Status: AC | PRN

## 2019-03-27 MED ORDER — ATORVASTATIN CALCIUM 40 MG PO TABS: 40.0000 mg | ORAL_TABLET | Freq: Every evening | ORAL | 1 refills | Status: DC

## 2019-03-27 MED ORDER — ASPIRIN EC 81 MG PO TBEC: 81.0000 mg | DELAYED_RELEASE_TABLET | Freq: Every day | ORAL | 1 refills | Status: DC

## 2019-03-27 MED ORDER — ISOSORBIDE MONONITRATE ER 30 MG PO TB24: 30.0000 mg | ORAL_TABLET | Freq: Two times a day (BID) | ORAL | 1 refills | Status: DC

## 2019-03-27 MED ORDER — LITHIUM CARBONATE 300 MG PO CAPS: 300.0000 mg | ORAL_CAPSULE | Freq: Every day | ORAL | 1 refills | Status: DC

## 2019-03-27 MED ORDER — FLUOXETINE HCL 20 MG PO CAPS: 60.0000 mg | ORAL_CAPSULE | Freq: Every day | ORAL | 1 refills | Status: DC

## 2019-03-27 MED ORDER — FOLIC ACID 1 MG PO TABS: 1.0000 mg | ORAL_TABLET | Freq: Every day | ORAL | 1 refills | Status: AC

## 2019-03-27 MED ORDER — CYCLOBENZAPRINE HCL 5 MG PO TABS: 5.0000 mg | ORAL_TABLET | Freq: Three times a day (TID) | ORAL | 0 refills | Status: DC | PRN

## 2019-03-27 MED ORDER — METOPROLOL TARTRATE 25 MG PO TABS: 25.0000 mg | ORAL_TABLET | Freq: Two times a day (BID) | ORAL | 1 refills | Status: DC

## 2019-03-27 MED ORDER — LISINOPRIL 20 MG PO TABS: 20.0000 mg | ORAL_TABLET | Freq: Every day | ORAL | 1 refills | Status: DC

## 2019-03-27 MED ORDER — PANTOPRAZOLE SODIUM 40 MG PO TBEC: 40.0000 mg | DELAYED_RELEASE_TABLET | Freq: Every day | ORAL | 1 refills | Status: DC

## 2019-03-27 MED ORDER — OLOPATADINE HCL 0.1 % OP SOLN: 1.0000 [drp] | Freq: Two times a day (BID) | OPHTHALMIC | 1 refills | Status: DC

## 2019-03-27 NOTE — Discharge Summary (Signed)
Physician Discharge Summary Note  Patient:  Teresa Lang is an 63 y.o., female MRN:  [id number] DOB:  04/25/1956 Patient phone:  [phone] (home)  Patient address:   [address] [place] [address],  Total Time spent with patient: 45 minutes  Date of Admission:  03/20/2019 Date of Discharge: March 27, 2019  Reason for Admission: Admitted because of presentation to the emergency room with worsening depression and suicidal ideation  Principal Problem: MDD (major depressive disorder), recurrent, severe, with psychosis (HCC) Discharge Diagnoses: Principal Problem:   MDD (major depressive disorder), recurrent, severe, with psychosis (HCC) Active Problems:   PTSD (post-traumatic stress disorder)   Fibromyalgia   Hyperlipemia, mixed   Hypertension, essential   Alcohol abuse   Past Psychiatric History: History of recurrent severe depression and suicidal ideation.  History of substance abuse.  Poor response to outpatient medication so far.  Past Medical History:  Past Medical History:  Diagnosis Date  . Allergy    seasonal  . Anxiety   . Arthritis   . Depression   . Hyperlipidemia   . Hypertension     Past Surgical History:  Procedure Laterality Date  . ABDOMINAL HYSTERECTOMY  1983  . CHOLECYSTECTOMY  1983  . SPINE SURGERY  beginning 1994   laminectomies, fusions   Family History:  Family History  Problem Relation Age of Onset  . Celiac disease Sister   . Breast cancer Neg Hx    Family Psychiatric  History: See previous Social History:  Social History   Substance and Sexual Activity  Alcohol Use Never  . Frequency: Never     Social History   Substance and Sexual Activity  Drug Use Yes  . Frequency: 7.0 times per week  . Types: Marijuana   Comment: "maybe once in the evening"; also uses "KRATUM, the herb, to helps with pain and anxiety"    Social History   Socioeconomic History  . Marital status: Widowed    Spouse name: Not on file  .  Number of children: Not on file  . Years of education: Not on file  . Highest education level: Not on file  Occupational History  . Occupation: disability  Social Needs  . Financial resource strain: Not on file  . Food insecurity    Worry: Not on file    Inability: Not on file  . Transportation needs    Medical: Not on file    Non-medical: Not on file  Tobacco Use  . Smoking status: Current Every Day Smoker    Packs/day: 0.25    Years: 57.00    Pack years: 14.25    Types: Cigarettes    Start date: 38  . Smokeless tobacco: Never Used  . Tobacco comment: Also VAPES on a daily basis  Substance and Sexual Activity  . Alcohol use: Never    Frequency: Never  . Drug use: Yes    Frequency: 7.0 times per week    Types: Marijuana    Comment: "maybe once in the evening"; also uses "KRATUM, the herb, to helps with pain and anxiety"  . Sexual activity: Not on file  Lifestyle  . Physical activity    Days per week: Not on file    Minutes per session: Not on file  . Stress: Not on file  Relationships  . Social Musician on phone: Not on file    Gets together: Not on file    Attends religious service: Not on file  Active member of club or organization: Not on file    Attends meetings of clubs or organizations: Not on file    Relationship status: Not on file  Other Topics Concern  . Not on file  Social History Narrative   Lives with sister    Hospital Course: Patient admitted to the psychiatric unit.  15-minute checks continued.  Treatment team evaluated.  Patient did not display dangerous or aggressive behavior.  Denied suicidal ideation.  She was cooperative with treatment.  I suggested adding low-dose lithium.  Patient was cooperative with this.  Lithium level prior to discharge is on the low end but I think that is better than risking having it be too high.  She reported gradual improvement in her mood.  Denied suicidal ideation at the time of discharge.  Patient is  strongly encouraged to continue follow-up in the community with outpatient mental health.  Physical Findings: AIMS: Facial and Oral Movements Muscles of Facial Expression: None, normal Lips and Perioral Area: None, normal Jaw: None, normal Tongue: None, normal,Extremity Movements Upper (arms, wrists, hands, fingers): None, normal Lower (legs, knees, ankles, toes): None, normal, Trunk Movements Neck, shoulders, hips: None, normal, Overall Severity Severity of abnormal movements (highest score from questions above): None, normal Incapacitation due to abnormal movements: None, normal Patient's awareness of abnormal movements (rate only patient's report): No Awareness, Dental Status Current problems with teeth and/or dentures?: No Does patient usually wear dentures?: No  CIWA:  CIWA-Ar Total: 4 COWS:  COWS Total Score: 1  Musculoskeletal: Strength & Muscle Tone: within normal limits Gait & Station: normal Patient leans: N/A  Psychiatric Specialty Exam: Physical Exam  Nursing note and vitals reviewed. Constitutional: She appears well-developed and well-nourished.  HENT:  Head: Normocephalic and atraumatic.  Eyes: Pupils are equal, round, and reactive to light. Conjunctivae are normal.  Neck: Normal range of motion.  Cardiovascular: Normal heart sounds.  Respiratory: Effort normal.  GI: Soft.  Musculoskeletal: Normal range of motion.  Neurological: She is alert.  Skin: Skin is warm and dry.  Psychiatric: She has a normal mood and affect. Her behavior is normal. Judgment and thought content normal.    Review of Systems  Constitutional: Negative.   HENT: Negative.   Eyes: Negative.   Respiratory: Negative.   Cardiovascular: Negative.   Gastrointestinal: Negative.   Musculoskeletal: Negative.   Skin: Negative.   Neurological: Negative.   Psychiatric/Behavioral: Negative.     Blood pressure (!) 145/95, pulse 66, temperature 98.8 F (37.1 C), temperature source Oral, resp.  rate 18, height 5\' 5"  (1.651 m), weight 86.2 kg, SpO2 93 %.Body mass index is 31.62 kg/m.  General Appearance: Disheveled  Eye Contact:  Good  Speech:  Clear and Coherent  Volume:  Normal  Mood:  Euthymic  Affect:  Constricted  Thought Process:  Coherent  Orientation:  Full (Time, Place, and Person)  Thought Content:  Logical  Suicidal Thoughts:  No  Homicidal Thoughts:  No  Memory:  Immediate;   Fair Recent;   Fair Remote;   Fair  Judgement:  Fair  Insight:  Fair  Psychomotor Activity:  Normal  Concentration:  Concentration: Fair  Recall:  [employer] of Knowledge:  Fair  Language:  Fair  Akathisia:  No  Handed:  Right  AIMS (if indicated):     Assets:  Desire for Improvement Housing Physical Health  ADL's:  Intact  Cognition:  WNL  Sleep:  Number of Hours: 8.25        Has  this patient used any form of tobacco in the last 30 days? (Cigarettes, Smokeless Tobacco, Cigars, and/or Pipes) Yes, Yes, A prescription for an FDA-approved tobacco cessation medication was offered at discharge and the patient refused  Blood Alcohol level:  Lab Results  Component Value Date   [facility] <10 03/21/2019   ETH <10 03/15/2019    Metabolic Disorder Labs:  Lab Results  Component Value Date   HGBA1C 5.5 03/21/2019   MPG 111.15 03/21/2019   No results found for: PROLACTIN Lab Results  Component Value Date   CHOL 145 03/21/2019   TRIG 90 03/21/2019   HDL 55 03/21/2019   CHOLHDL 2.6 03/21/2019   VLDL 18 03/21/2019   LDLCALC 72 03/21/2019    See Psychiatric Specialty Exam and Suicide Risk Assessment completed by Attending Physician prior to discharge.  Discharge destination:  Home  Is patient on multiple antipsychotic therapies at discharge:  No   Has Patient had three or more failed trials of antipsychotic monotherapy by history:  No  Recommended Plan for Multiple Antipsychotic Therapies: NA  Discharge Instructions    Diet - low sodium heart healthy   Complete by: As  directed    Increase activity slowly   Complete by: As directed      Allergies as of 03/27/2019      Reactions   Prednisone Anxiety      Medication List    STOP taking these medications   Brexpiprazole 3 MG Tabs   busPIRone 15 MG tablet Commonly known as: BUSPAR   diclofenac sodium 1 % Gel Commonly known as: VOLTAREN   gabapentin 300 MG capsule Commonly known as: NEURONTIN   ibuprofen 200 MG tablet Commonly known as: ADVIL   lisinopril-hydrochlorothiazide 20-12.5 MG tablet Commonly known as: ZESTORETIC   mirtazapine 30 MG tablet Commonly known as: REMERON   omeprazole 40 MG capsule Commonly known as: PRILOSEC   tiZANidine 4 MG tablet Commonly known as: ZANAFLEX   Trelegy Ellipta 100-62.5-25 MCG/INH Aepb Generic drug: Fluticasone-Umeclidin-Vilant     TAKE these medications     Indication  albuterol 108 (90 Base) MCG/ACT inhaler Commonly known as: VENTOLIN HFA Inhale 2 puffs into the lungs every 6 (six) hours as needed for wheezing or shortness of breath.  Indication: Asthma   aspirin EC 81 MG tablet Take 1 tablet (81 mg total) by mouth daily.  Indication: Stable Angina Pectoris   atorvastatin 40 MG tablet Commonly known as: LIPITOR Take 1 tablet (40 mg total) by mouth every evening.  Indication: High Amount of Fats in the Blood   clonazePAM 1 MG tablet Commonly known as: KLONOPIN Take 1 tablet (1 mg total) by mouth 2 (two) times daily as needed for up to 30 days for anxiety.  Indication: Panic Disorder   cyclobenzaprine 5 MG tablet Commonly known as: FLEXERIL Take 1 tablet (5 mg total) by mouth 3 (three) times daily as needed for muscle spasms.  Indication: Muscle Spasm   FLUoxetine 20 MG capsule Commonly known as: PROZAC Take 3 capsules (60 mg total) by mouth daily.  Indication: Major Depressive Disorder   fluticasone furoate-vilanterol 100-25 MCG/INH Aepb Commonly known as: BREO ELLIPTA Inhale 1 puff into the lungs daily.  Indication:  Asthma   folic acid 1 MG tablet Commonly known as: FOLVITE Take 1 tablet (1 mg total) by mouth daily.  Indication: Methotrexate Poisoning   isosorbide mononitrate 30 MG 24 hr tablet Commonly known as: IMDUR Take 1 tablet (30 mg total) by mouth 2 (two) times daily.  What changed: how much to take  Indication: Stable Angina Pectoris   lisinopril 20 MG tablet Commonly known as: ZESTRIL Take 1 tablet (20 mg total) by mouth daily.  Indication: High Blood Pressure Disorder   lithium carbonate 300 MG capsule Take 1 capsule (300 mg total) by mouth at bedtime.  Indication: Major Depressive Disorder   methotrexate 2.5 MG tablet Commonly known as: RHEUMATREX Take 15 mg by mouth once a week.  Indication: Rheumatoid Arthritis   metoprolol tartrate 25 MG tablet Commonly known as: LOPRESSOR Take 1 tablet (25 mg total) by mouth 2 (two) times daily.  Indication: High Blood Pressure Disorder   olopatadine 0.1 % ophthalmic solution Commonly known as: PATANOL Place 1 drop into both eyes 2 (two) times daily.  Indication: Allergic Conjunctivitis   oxyCODONE 5 MG immediate release tablet Commonly known as: Oxy IR/ROXICODONE Take 5 mg by mouth 3 (three) times daily.  Indication: Chronic Pain   pantoprazole 40 MG tablet Commonly known as: PROTONIX Take 1 tablet (40 mg total) by mouth daily.  Indication: Gastroesophageal Reflux Disease   traZODone 100 MG tablet Commonly known as: DESYREL Take 1 tablet (100 mg total) by mouth at bedtime.  Indication: Trouble Sleeping   umeclidinium bromide 62.5 MCG/INH Aepb Commonly known as: INCRUSE ELLIPTA Inhale 1 puff into the lungs daily.  Indication: Chronic Obstructive Lung Disease      Follow-up Information    [facility] Follow up on 03/27/2019.   Why: Please follow up with [facility] on, Thursday, June 18th at 12:30pm.  Please take your hospital discharge paperwork with you to your appointment.  Thank you. Contact information: [address] [place] [id number] [phone]           Follow-up recommendations:  Activity:  Activity as tolerated Diet:  Regular diet Other:  Follow-up with [facility] continue current medicine.  Continue taking care of overall health.  Comments: Patient given lots of encouragement and support.  Patient had requested ECT but given her record of extremely poor compliance and tolerance it does not seem like a realistic treatment plan when other options are available.  Signed: Mordecai RasmussenJohn Clapacs, MD 03/27/2019, 7:30 PM

## 2019-03-27 NOTE — Progress Notes (Signed)
Recreation Therapy Notes  INPATIENT RECREATION TR PLAN  Patient Details Name: Treasure Ochs MRN: [id number] DOB: 1956/08/03 Today's Date: 03/27/2019  Rec Therapy Plan Is patient appropriate for Therapeutic Recreation?: Yes Treatment times per week: at least 3 Estimated Length of Stay: 5-7 days TR Treatment/Interventions: Group participation (Comment)  Discharge Criteria Pt will be discharged from therapy if:: Discharged Treatment plan/goals/alternatives discussed and agreed upon by:: Patient/family  Discharge Summary Short term goals set: Patient will identify 3 positive coping skills strategies to use for anxiety post d/c within 5 recreation therapy group sessions Short term goals met: Not met Reason goals not met: Patient did not attend any groups Therapeutic equipment acquired: N/A Reason patient discharged from therapy: Discharge from hospital Pt/family agrees with progress & goals achieved: Yes Date patient discharged from therapy: 03/27/19   Kynli Chou 03/27/2019, 1:18 PM

## 2019-03-27 NOTE — Progress Notes (Signed)
D: Patient is aware of  Discharge this shift .Patient denies suicidal /homicidal ideations. Patient received all belongings brought in   A: No Storage medications. Writer reviewed Discharge Summary, Suicide Risk Assessment, and Transitional Record. Patient also received Prescriptions   from  MD. A 7 day supply of medications given to patient Patient Aware  Of follow up appointment .  R: Patient left unit with no questions  Or concerns  With sister

## 2019-03-27 NOTE — Progress Notes (Signed)
  [facility] Adult Case Management Discharge Plan :  Will you be returning to the same living situation after discharge:  Yes,  home At discharge, do you have transportation home?: Yes,  ride will pick pt up at 4 Do you have the ability to pay for your medications: Yes,  [employer] Medicare  Release of information consent forms completed and in the chart;    Patient to Follow up at: Follow-up Information    [facility] Follow up on 03/27/2019.   Why: Please follow up with [facility] on, Thursday, June 18th at 12:30pm.  Please take your hospital discharge paperwork with you to your appointment.  Thank you. Contact information: [place] [address] [phone]           Next level of care provider has access to [employer] and Suicide Prevention discussed: Yes,  SPE completed with pts sister     Has patient been referred to the Quitline?: Patient refused referral  Patient has been referred for addiction treatment: N/A  Delfin Edis, LCSW 03/27/2019, 9:14 AM

## 2019-03-27 NOTE — Progress Notes (Signed)
D- Patient alert and oriented. Sad facies,pain. Denies SI, HI, AVH.[name] Kitchen How did they do with achieving previous goal--managing pain with moderate pain medicines.. A- Scheduled medications administered to patient, per MD orders. Support and encouragement provided.  Routine safety checks conducted every 15 minutes.  Patient informed to notify staff with problems or concerns. R- No adverse drug reactions noted. Patient contracts for safety at this time. Patient compliant with medications and treatment plan. Patient receptive, calm, and cooperative.  Patient remains safe at this time.

## 2019-03-27 NOTE — Plan of Care (Signed)
  Problem: Education: Goal: Knowledge of [place] General Education information/materials will improve Outcome: Adequate for Discharge Goal: Emotional status will improve Outcome: Adequate for Discharge Goal: Mental status will improve Outcome: Adequate for Discharge Goal: Verbalization of understanding the information provided will improve Outcome: Adequate for Discharge   Problem: Safety: Goal: Periods of time without injury will increase Outcome: Adequate for Discharge   Problem: Education: Goal: Knowledge of [place] General Education information/materials will improve Outcome: Adequate for Discharge Goal: Emotional status will improve Outcome: Adequate for Discharge Goal: Mental status will improve Outcome: Adequate for Discharge Goal: Verbalization of understanding the information provided will improve Outcome: Adequate for Discharge   Problem: Activity: Goal: Interest or engagement in activities will improve Outcome: Adequate for Discharge Goal: Sleeping patterns will improve Outcome: Adequate for Discharge   Problem: Coping: Goal: Ability to verbalize frustrations and anger appropriately will improve Outcome: Adequate for Discharge Goal: Ability to demonstrate self-control will improve Outcome: Adequate for Discharge   Problem: Health Behavior/Discharge Planning: Goal: Identification of resources available to assist in meeting health care needs will improve Outcome: Adequate for Discharge Goal: Compliance with treatment plan for underlying cause of condition will improve Outcome: Adequate for Discharge   Problem: Physical Regulation: Goal: Ability to maintain clinical measurements within normal limits will improve Outcome: Adequate for Discharge   Problem: Education: Goal: Utilization of techniques to improve thought processes will improve Outcome: Adequate for Discharge Goal: Knowledge of the prescribed therapeutic regimen will improve Outcome:  Adequate for Discharge   Problem: Safety: Goal: Ability to disclose and discuss suicidal ideas will improve Outcome: Adequate for Discharge Goal: Ability to identify and utilize support systems that promote safety will improve Outcome: Adequate for Discharge   Problem: Coping: Goal: Coping ability will improve Outcome: Adequate for Discharge Goal: Will verbalize feelings Outcome: Adequate for Discharge   Problem: Role Relationship: Goal: Will demonstrate positive changes in social behaviors and relationships Outcome: Adequate for Discharge   Problem: Self-Concept: Goal: Will verbalize positive feelings about self Outcome: Adequate for Discharge Goal: Level of anxiety will decrease Outcome: Adequate for Discharge   Problem: Education: Goal: Utilization of techniques to improve thought processes will improve Outcome: Adequate for Discharge Goal: Knowledge of the prescribed therapeutic regimen will improve Outcome: Adequate for Discharge   Problem: Activity: Goal: Interest or engagement in leisure activities will improve Outcome: Adequate for Discharge Goal: Imbalance in normal sleep/wake cycle will improve Outcome: Adequate for Discharge   Problem: Coping: Goal: Coping ability will improve Outcome: Adequate for Discharge Goal: Will verbalize feelings Outcome: Adequate for Discharge   Problem: Health Behavior/Discharge Planning: Goal: Ability to make decisions will improve Outcome: Adequate for Discharge Goal: Compliance with therapeutic regimen will improve Outcome: Adequate for Discharge   Problem: Safety: Goal: Ability to disclose and discuss suicidal ideas will improve Outcome: Adequate for Discharge Goal: Ability to identify and utilize support systems that promote safety will improve Outcome: Adequate for Discharge   Problem: Self-Concept: Goal: Will verbalize positive feelings about self Outcome: Adequate for Discharge Goal: Level of anxiety will  decrease Outcome: Adequate for Discharge

## 2019-03-27 NOTE — Plan of Care (Signed)
Patient continues with requiring medicines to help control her complaint of pain. Emotionally she is more controlled, able to express her needs and identify what behaviors help with pain control.  Problem: Education: Goal: Knowledge of [place] General Education information/materials will improve Outcome: Progressing   Problem: Education: Goal: Verbalization of understanding the information provided will improve Outcome: Progressing   Problem: Education: Goal: Emotional status will improve Outcome: Progressing   Problem: Coping: Goal: Ability to verbalize frustrations and anger appropriately will improve Outcome: Progressing   Problem: Coping: Goal: Ability to demonstrate self-control will improve Outcome: Progressing

## 2019-03-27 NOTE — Progress Notes (Signed)
Recreation Therapy Notes  Date: 03/27/2019  Time: 9:30 am   Location: Craft room   Behavioral response: N/A   Intervention Topic: Communication  Discussion/Intervention: Patient did not attend group.   Clinical Observations/Feedback:  Patient did not attend group.   Vanderbilt Ranieri LRT/CTRS        Myrtis Maille 03/27/2019 11:06 AM

## 2019-04-09 ENCOUNTER — Other Ambulatory Visit
Admission: RE | Admit: 2019-04-09 | Discharge: 2019-04-09 | Disposition: A | Discharge status: Home / Self Care | Payer: Medicare HMO | Source: Ambulatory Visit | Attending: Physician Assistant | Admitting: Physician Assistant

## 2019-04-09 DIAGNOSIS — Z79899 Other long term (current) drug therapy: Secondary | ICD-10-CM | POA: Diagnosis present

## 2019-04-09 DIAGNOSIS — F332 Major depressive disorder, recurrent severe without psychotic features: Secondary | ICD-10-CM | POA: Diagnosis present

## 2019-04-09 LAB — LITHIUM LEVEL: Lithium Lvl: 0.24 mmol/L — ABNORMAL LOW (ref 0.60–1.20)

## 2019-04-16 DIAGNOSIS — G894 Chronic pain syndrome: Secondary | ICD-10-CM | POA: Insufficient documentation

## 2019-04-18 ENCOUNTER — Other Ambulatory Visit: Payer: Self-pay | Admitting: Psychiatry

## 2019-04-20 ENCOUNTER — Other Ambulatory Visit: Payer: Self-pay | Admitting: Psychiatry

## 2019-04-25 ENCOUNTER — Other Ambulatory Visit: Payer: Self-pay | Admitting: Physician Assistant

## 2019-04-25 ENCOUNTER — Ambulatory Visit
Admission: RE | Admit: 2019-04-25 | Discharge: 2019-04-25 | Disposition: A | Discharge status: Home / Self Care | Payer: Medicare HMO | Source: Ambulatory Visit | Attending: Physician Assistant | Admitting: Physician Assistant

## 2019-04-25 ENCOUNTER — Other Ambulatory Visit: Payer: Self-pay

## 2019-04-25 DIAGNOSIS — R296 Repeated falls: Secondary | ICD-10-CM | POA: Insufficient documentation

## 2019-04-25 DIAGNOSIS — R42 Dizziness and giddiness: Secondary | ICD-10-CM | POA: Diagnosis not present

## 2019-04-25 IMAGING — CT CT HEAD WITHOUT CONTRAST
3 series · 15 of 47 positions shown, 18 images · non-contrast
Comparison: None.

CLINICAL DATA: Dizziness.  Several recent falls

EXAM:
CT HEAD WITHOUT CONTRAST
TECHNIQUE: Contiguous axial images were obtained from the base of the skull
through the vertex without intravenous contrast.

[Series 3: head wo · axial · 0.45mm/px · z∈[-131,-6]mm · 9 of 30 slices shown, 12 images]
[im 3/30  brain]
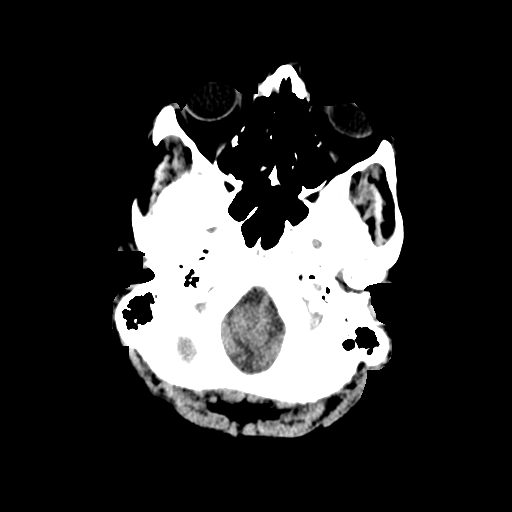
[im 3/30  bone]
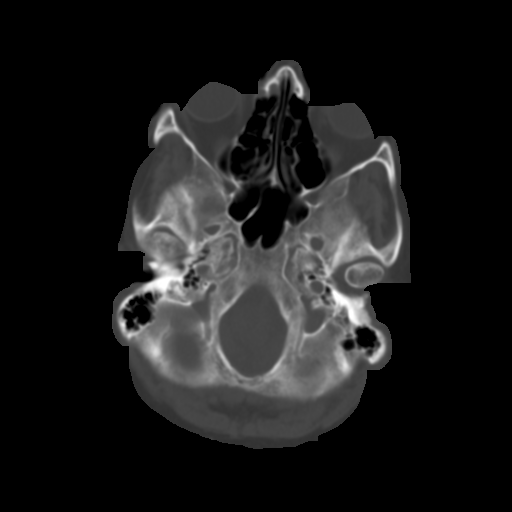
[im 6/30  brain]
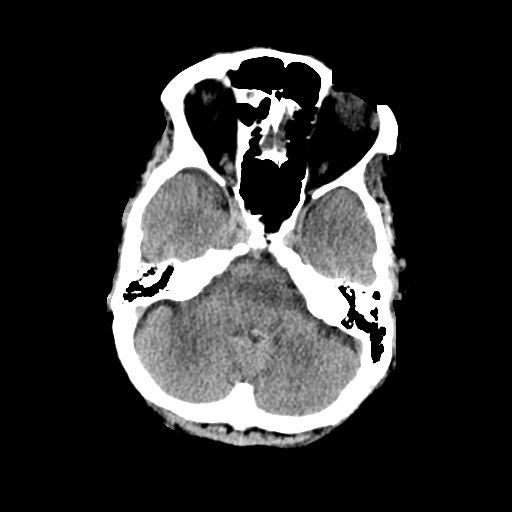
[im 9/30  brain]
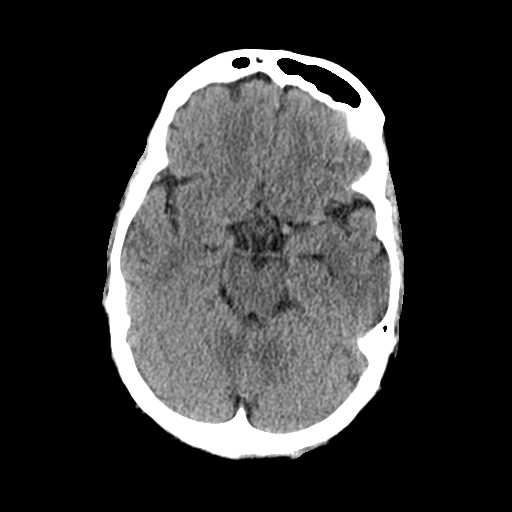
[im 12/30  brain]
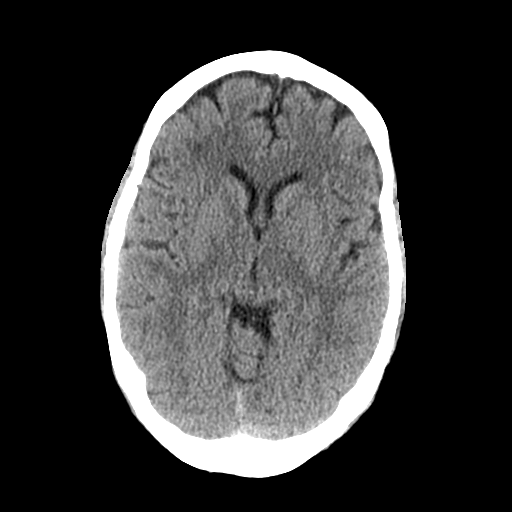
[im 16/30  brain]
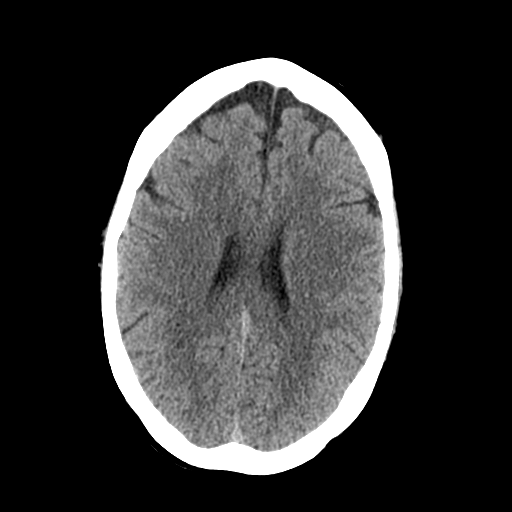
[im 16/30  bone]
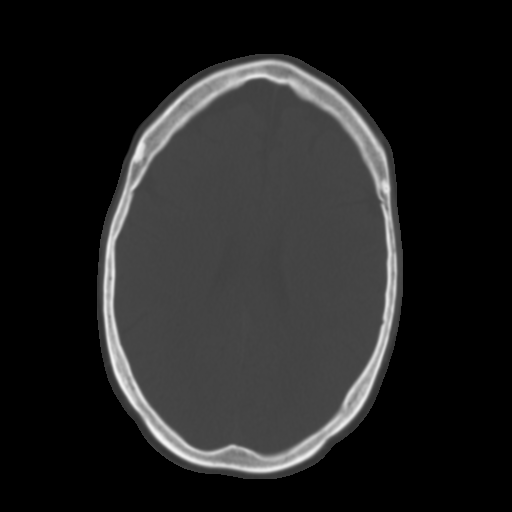
[im 19/30  brain]
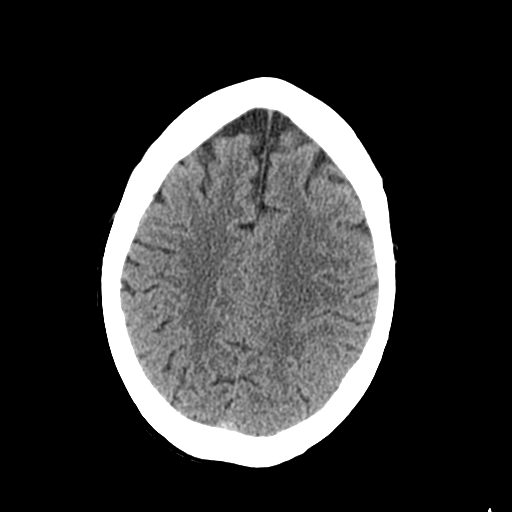
[im 22/30  brain]
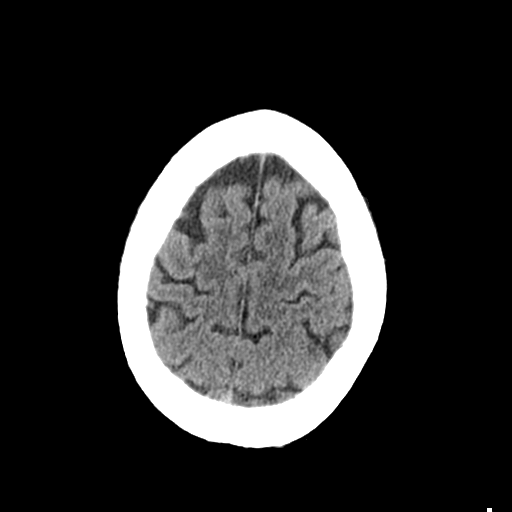
[im 25/30  brain]
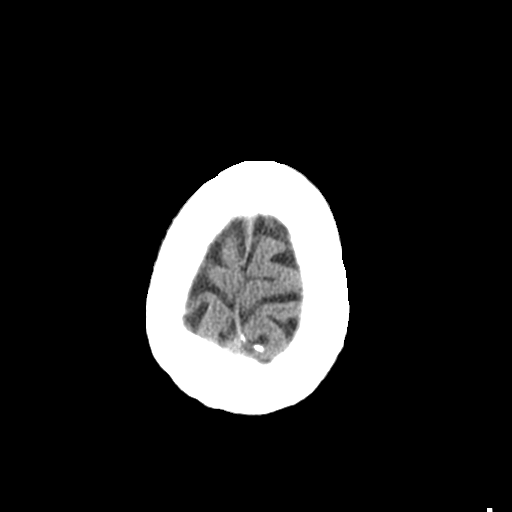
[im 28/30  brain]
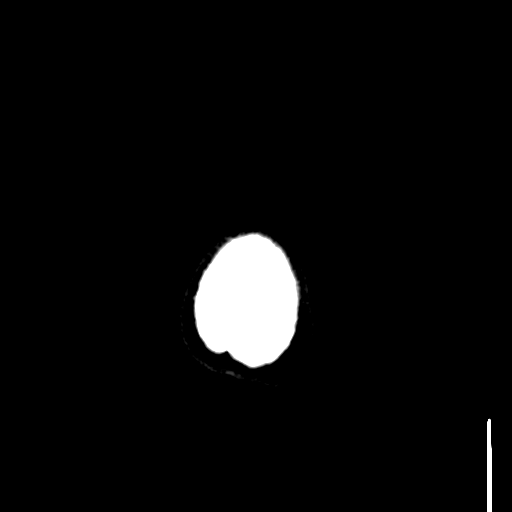
[im 28/30  bone]
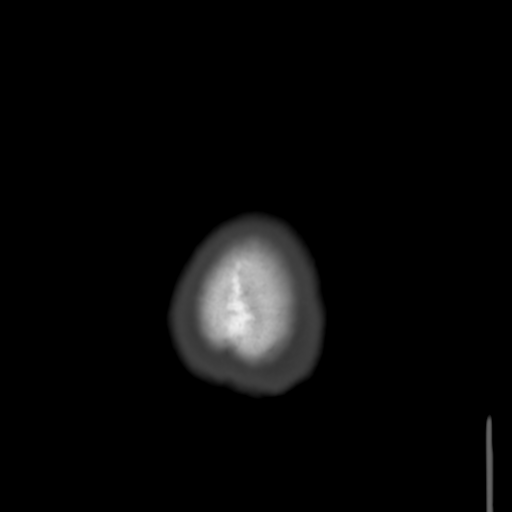

[Series 4: coronal soft tissue · coronal · 0.32mm/px · 3 of 70 slices shown]
[im 24/70  brain]
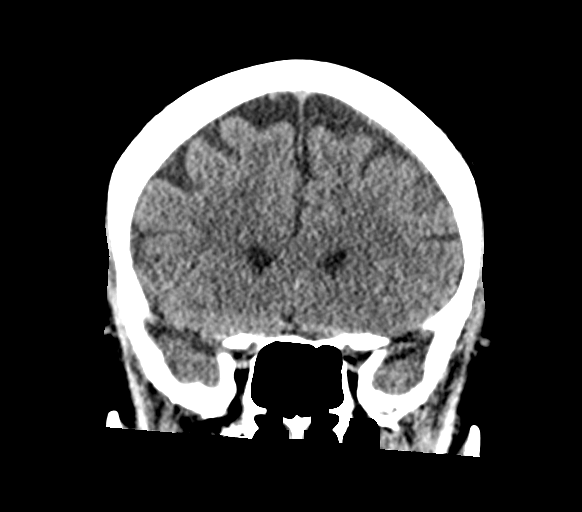
[im 31/70  brain]
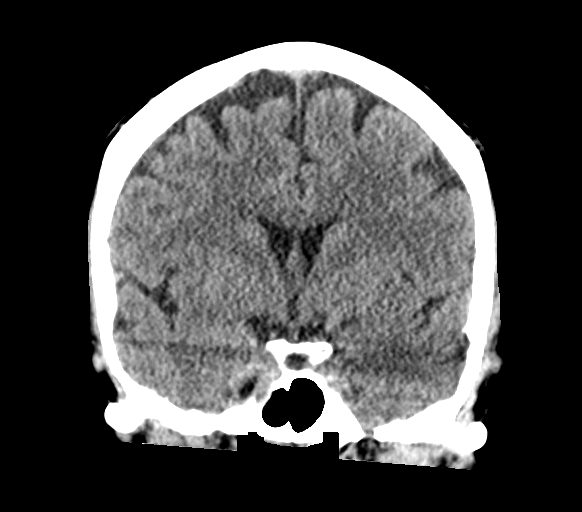
[im 39/70  brain]
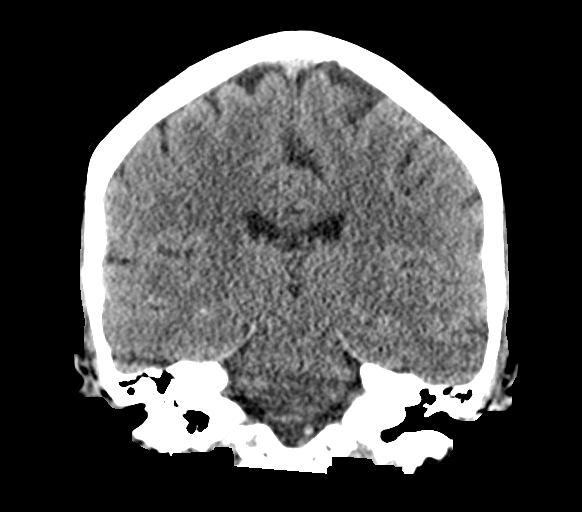

[Series 5: sagittal soft tissue · sagittal · 0.33mm/px · 3 of 49 slices shown]
[im 17/49  brain]
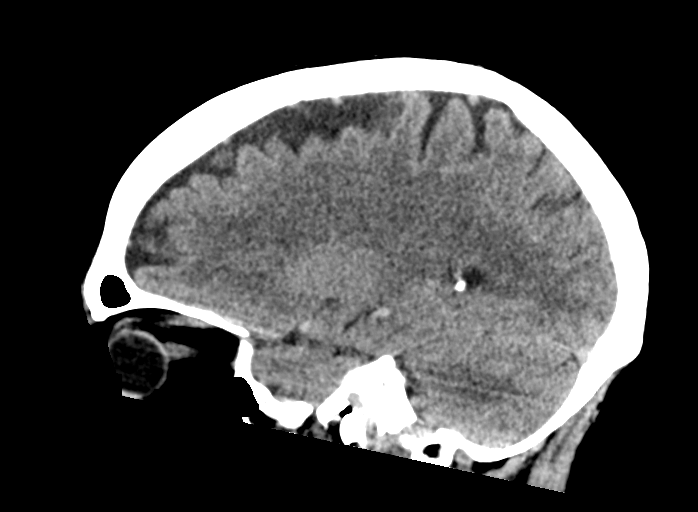
[im 25/49  brain]
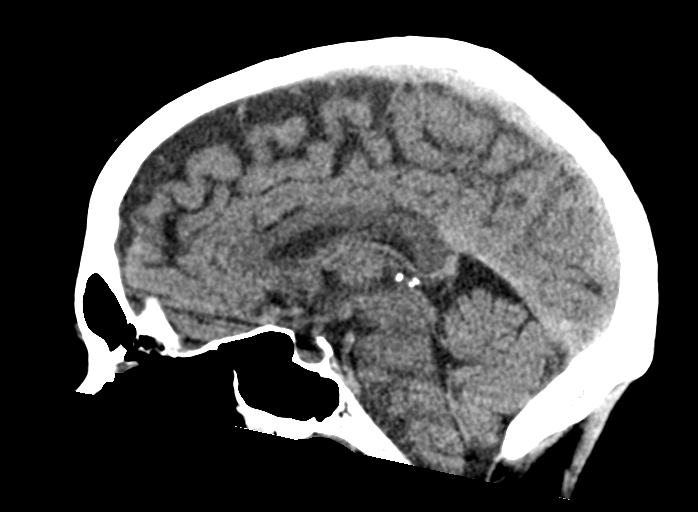
[im 33/49  brain]
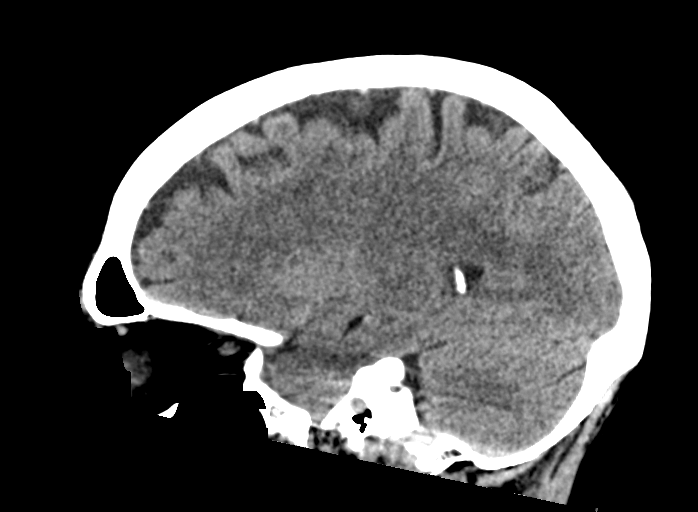

[15 of 47 positions shown; findings below may reference images not displayed]

FINDINGS: Brain: The ventricles are normal in size and configuration. There is
no intracranial mass, hemorrhage, extra-axial fluid collection, or
midline shift. The brain parenchyma appears unremarkable. No acute
infarct evident.

Vascular: There is no hyperdense vessel. No appreciable vascular
calcification evident.

Skull: The bony calvarium appears intact.

Sinuses/Orbits: There is opacification in several ethmoid air cells.
Other visualized paranasal sinuses are clear. Visualized orbits
appear symmetric bilaterally.

Other: Mastoid air cells are clear.
IMPRESSION: Opacification in several ethmoid air cells. Study otherwise
unremarkable.

## 2019-05-13 ENCOUNTER — Other Ambulatory Visit: Payer: Self-pay | Admitting: Psychiatry

## 2019-05-15 ENCOUNTER — Other Ambulatory Visit: Payer: Self-pay | Admitting: Psychiatry

## 2019-05-17 ENCOUNTER — Other Ambulatory Visit: Payer: Self-pay | Admitting: Psychiatry

## 2019-05-23 ENCOUNTER — Other Ambulatory Visit: Payer: Self-pay | Admitting: Psychiatry

## 2019-06-12 ENCOUNTER — Other Ambulatory Visit: Payer: Self-pay | Admitting: Psychiatry

## 2019-06-25 ENCOUNTER — Other Ambulatory Visit: Payer: Self-pay | Admitting: *Deleted

## 2019-06-25 ENCOUNTER — Encounter: Payer: Self-pay | Admitting: Emergency Medicine

## 2019-06-25 ENCOUNTER — Other Ambulatory Visit: Payer: Self-pay

## 2019-06-25 ENCOUNTER — Emergency Department: Admission: EM | Admit: 2019-06-25 | Payer: Medicare HMO | Source: Home / Self Care

## 2019-06-25 DIAGNOSIS — Z20822 Contact with and (suspected) exposure to covid-19: Secondary | ICD-10-CM

## 2019-06-25 NOTE — ED Triage Notes (Signed)
Pt in via POV, reports being exposed to a family member x one week ago that tested positive for COVID.  Pt denies any symptoms.  Ambulatory to triage, vitals WDL, NAD noted at this time.

## 2019-06-25 NOTE — ED Notes (Signed)
Wants to be tested for COVID prior to upcoming travel. Nephew tested positive. History unclear if pt has recently been around nephew. Denies any sx. Pt alert and oriented X4, cooperative, RR even and unlabored, color WNL. Pt in NAD.

## 2019-06-26 LAB — NOVEL CORONAVIRUS, NAA: SARS-CoV-2, NAA: NOT DETECTED

## 2019-08-12 ENCOUNTER — Ambulatory Visit
Admission: RE | Admit: 2019-08-12 | Discharge: 2019-08-12 | Disposition: A | Discharge status: Home / Self Care | Payer: Medicare HMO | Attending: Pain Medicine | Admitting: Pain Medicine

## 2019-08-12 ENCOUNTER — Other Ambulatory Visit: Payer: Self-pay | Admitting: Pain Medicine

## 2019-08-12 ENCOUNTER — Other Ambulatory Visit: Payer: Self-pay

## 2019-08-12 ENCOUNTER — Ambulatory Visit
Admission: RE | Admit: 2019-08-12 | Discharge: 2019-08-12 | Disposition: A | Discharge status: Home / Self Care | Payer: Medicare HMO | Source: Ambulatory Visit | Attending: Pain Medicine | Admitting: Pain Medicine

## 2019-08-12 DIAGNOSIS — G8929 Other chronic pain: Secondary | ICD-10-CM | POA: Insufficient documentation

## 2019-08-12 DIAGNOSIS — M25561 Pain in right knee: Secondary | ICD-10-CM | POA: Diagnosis not present

## 2019-08-12 IMAGING — CR DG KNEE 3 VIEWS*R*
1 series · 3 of 3 positions shown · non-contrast
Comparison: None.

CLINICAL DATA: Fall several weeks ago with persistent right knee
pain, initial encounter

EXAM:
RIGHT KNEE - 3 VIEW

[Series 1: dg knee 3 views right · 0.14mm/px · 3 of 3 slices shown]
[im 1/3]
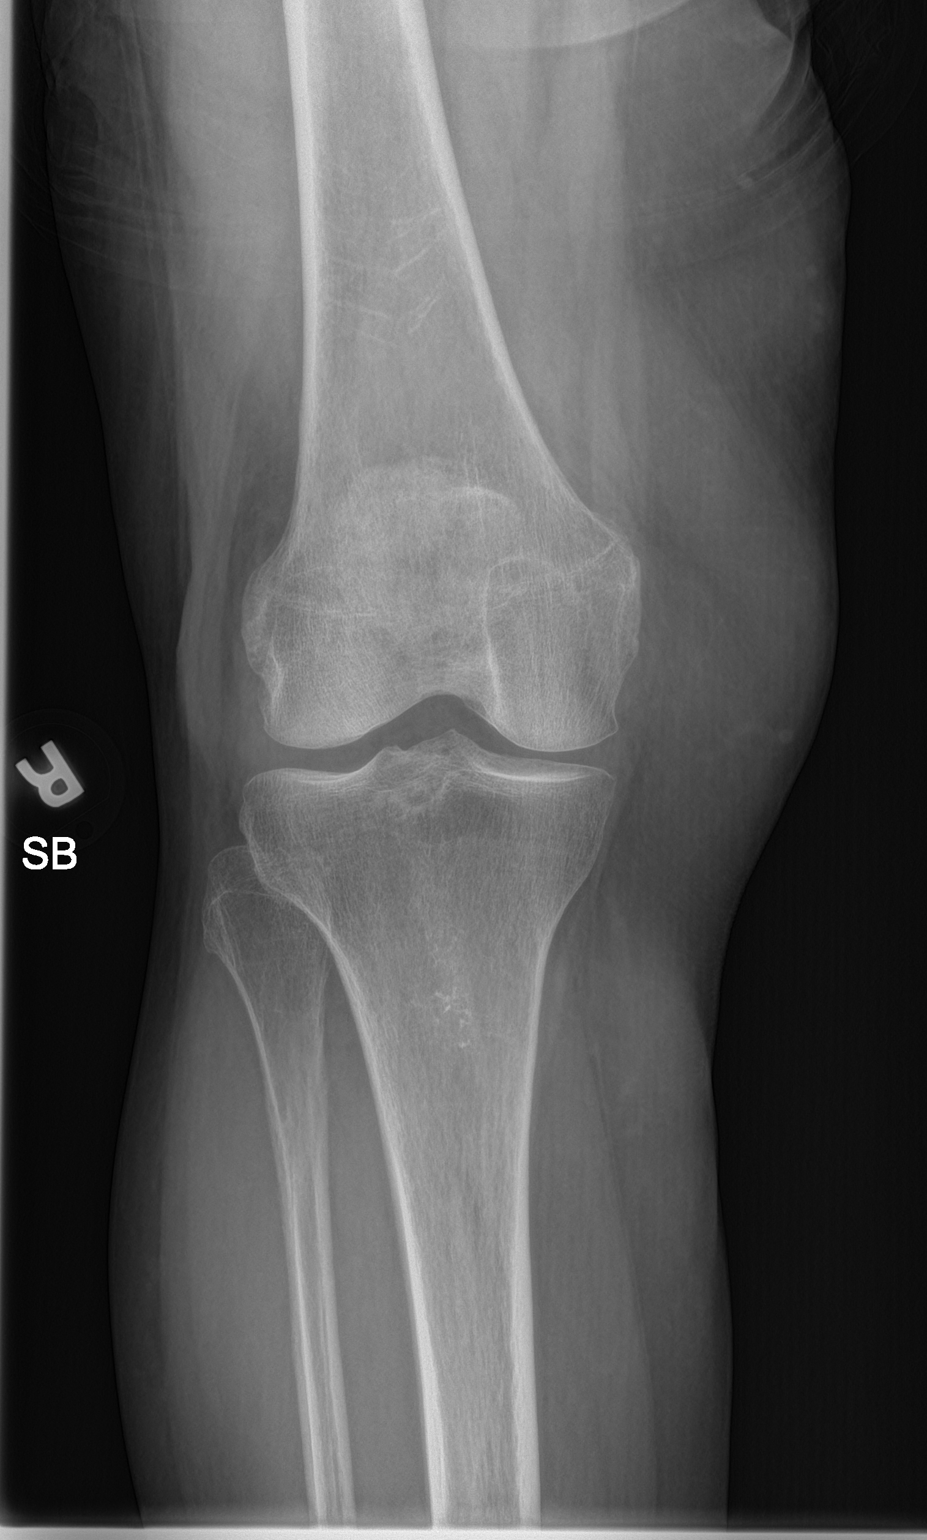
[im 2/3]
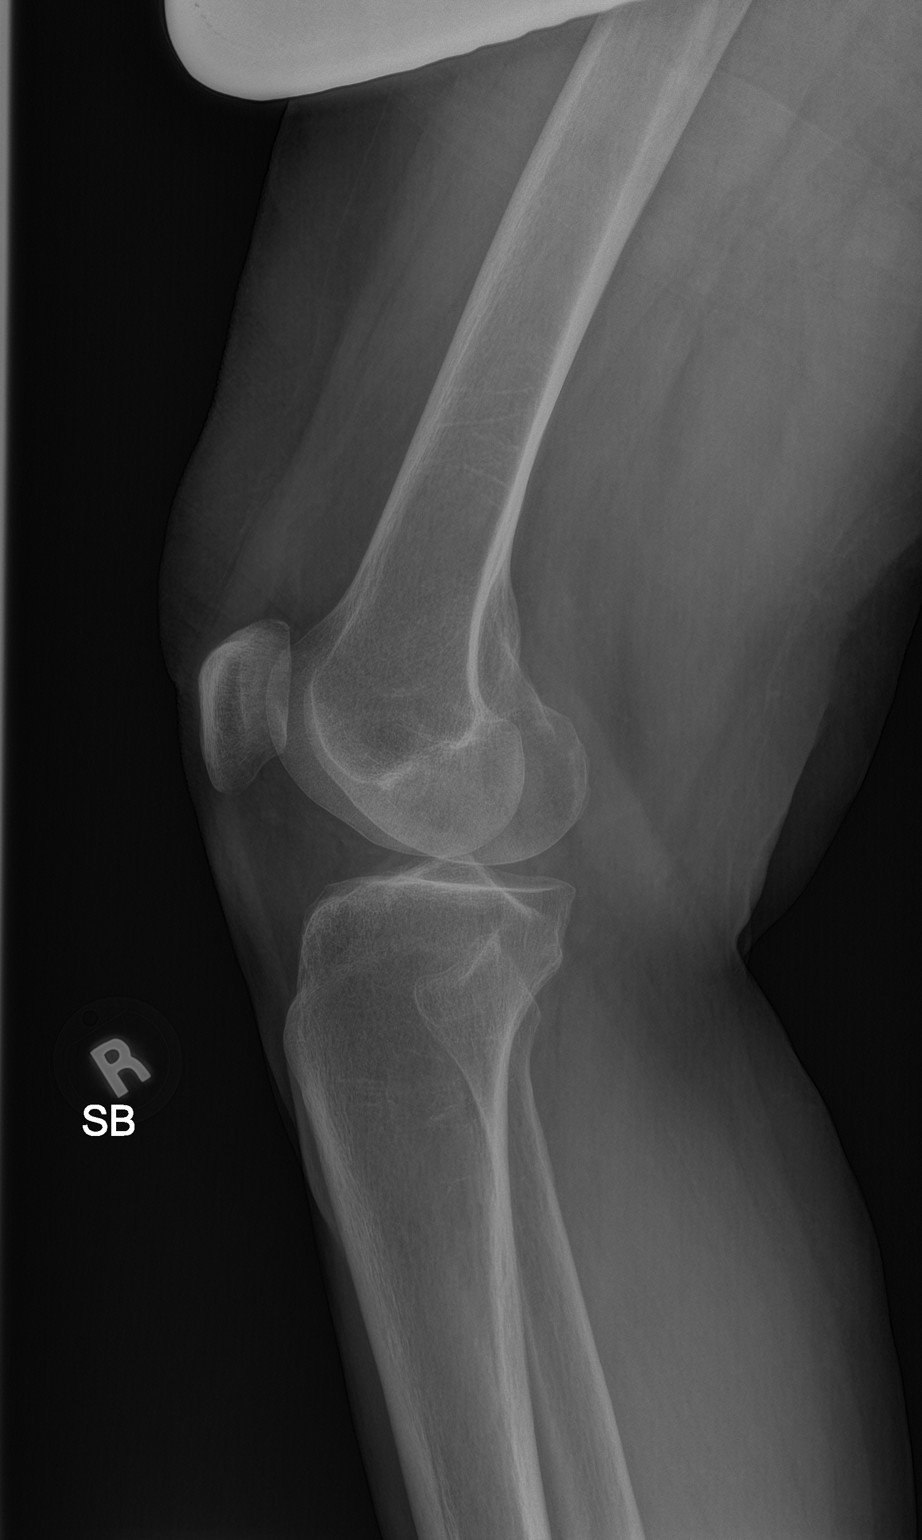
[im 3/3]
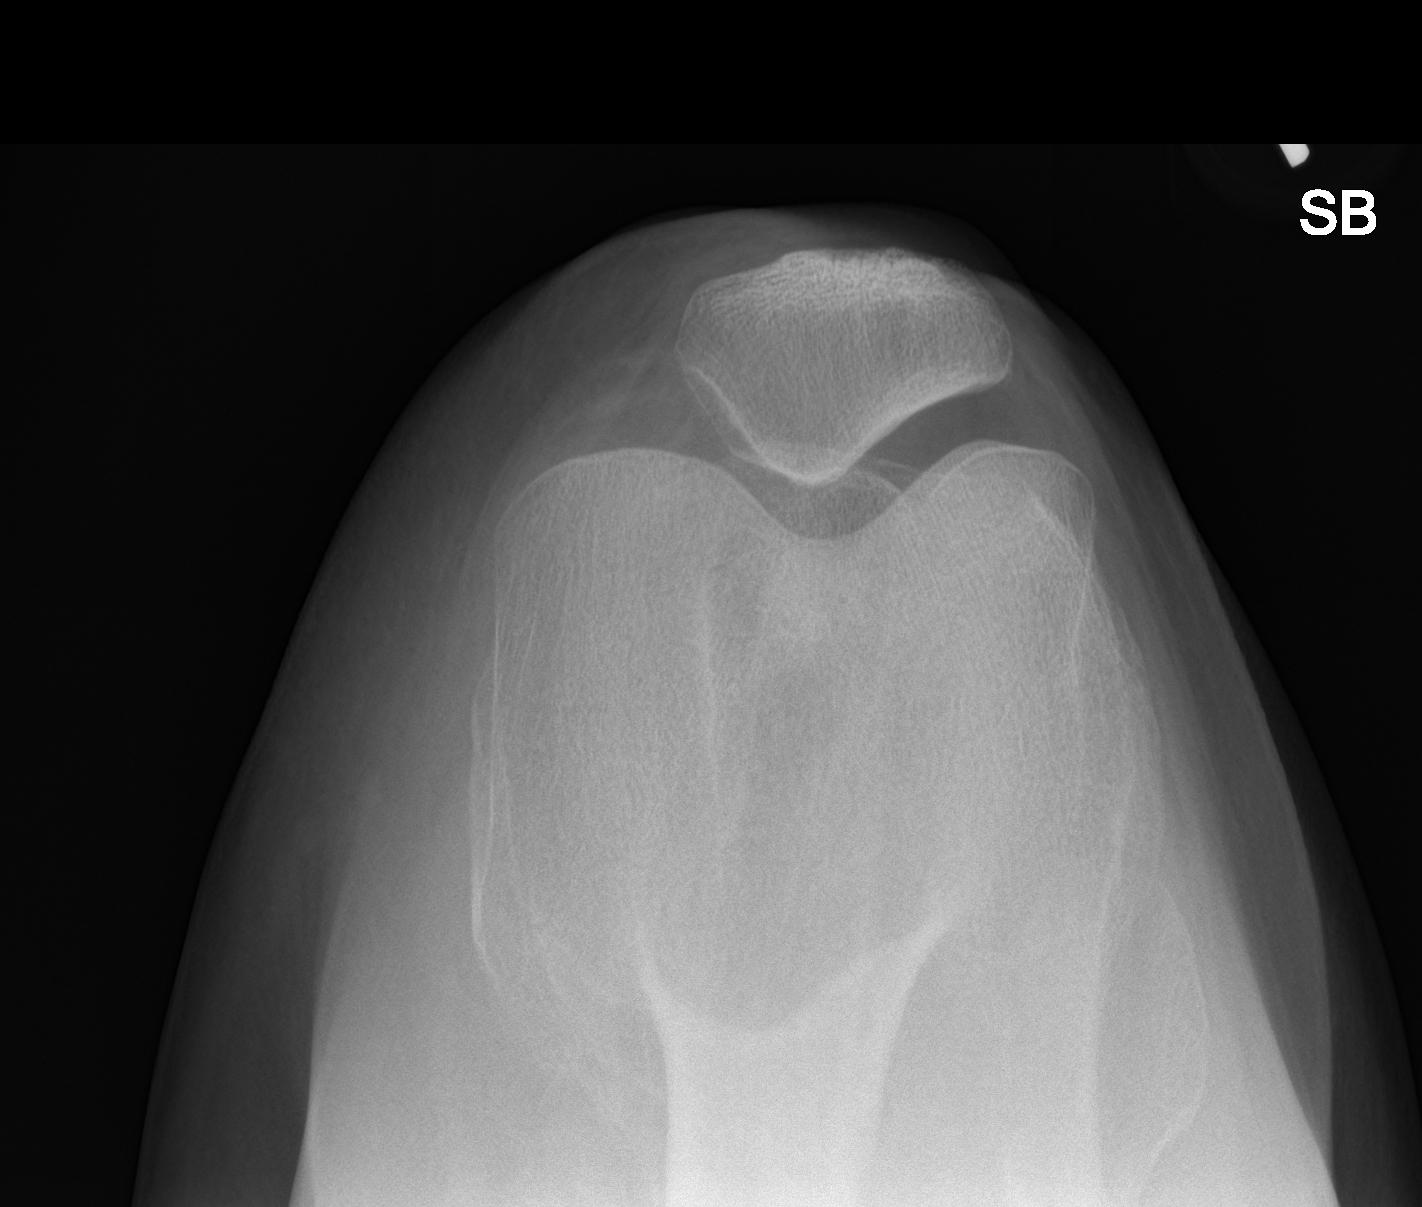

[3 of 3 positions shown; findings below may reference images not displayed]

FINDINGS: No evidence of fracture, dislocation, or joint effusion. No evidence
of arthropathy or other focal bone abnormality. Soft tissues are
unremarkable.
IMPRESSION: No acute abnormality noted.

## 2019-08-19 ENCOUNTER — Emergency Department: Payer: Medicare HMO

## 2019-08-19 ENCOUNTER — Emergency Department
Admission: EM | Admit: 2019-08-19 | Discharge: 2019-08-19 | Disposition: A | Discharge status: Home / Self Care | Payer: Medicare HMO | Attending: Emergency Medicine | Admitting: Emergency Medicine

## 2019-08-19 ENCOUNTER — Other Ambulatory Visit: Payer: Self-pay

## 2019-08-19 ENCOUNTER — Encounter: Payer: Self-pay | Admitting: Emergency Medicine

## 2019-08-19 DIAGNOSIS — Z7982 Long term (current) use of aspirin: Secondary | ICD-10-CM | POA: Diagnosis not present

## 2019-08-19 DIAGNOSIS — R519 Headache, unspecified: Secondary | ICD-10-CM | POA: Insufficient documentation

## 2019-08-19 DIAGNOSIS — F17210 Nicotine dependence, cigarettes, uncomplicated: Secondary | ICD-10-CM | POA: Diagnosis not present

## 2019-08-19 DIAGNOSIS — I10 Essential (primary) hypertension: Secondary | ICD-10-CM | POA: Diagnosis not present

## 2019-08-19 DIAGNOSIS — W010XXA Fall on same level from slipping, tripping and stumbling without subsequent striking against object, initial encounter: Secondary | ICD-10-CM | POA: Insufficient documentation

## 2019-08-19 DIAGNOSIS — Y929 Unspecified place or not applicable: Secondary | ICD-10-CM | POA: Diagnosis not present

## 2019-08-19 DIAGNOSIS — S42291A Other displaced fracture of upper end of right humerus, initial encounter for closed fracture: Secondary | ICD-10-CM

## 2019-08-19 DIAGNOSIS — Z7901 Long term (current) use of anticoagulants: Secondary | ICD-10-CM | POA: Diagnosis not present

## 2019-08-19 DIAGNOSIS — Y939 Activity, unspecified: Secondary | ICD-10-CM | POA: Diagnosis not present

## 2019-08-19 DIAGNOSIS — Z79899 Other long term (current) drug therapy: Secondary | ICD-10-CM | POA: Diagnosis not present

## 2019-08-19 DIAGNOSIS — Y999 Unspecified external cause status: Secondary | ICD-10-CM | POA: Insufficient documentation

## 2019-08-19 DIAGNOSIS — M542 Cervicalgia: Secondary | ICD-10-CM | POA: Insufficient documentation

## 2019-08-19 DIAGNOSIS — S4991XA Unspecified injury of right shoulder and upper arm, initial encounter: Secondary | ICD-10-CM | POA: Diagnosis present

## 2019-08-19 IMAGING — CT CT HEAD W/O CM
4 series · 16 of 47 positions shown, 18 images · non-contrast
Comparison: None.

CLINICAL DATA: Fell with trauma to the head and neck.

EXAM:
CT HEAD WITHOUT CONTRAST
CT CERVICAL SPINE WITHOUT CONTRAST
TECHNIQUE: Multidetector CT imaging of the head and cervical spine was
performed following the standard protocol without intravenous
contrast. Multiplanar CT image reconstructions of the cervical spine
were also generated.

[Series 2: head wo · axial · 0.42mm/px · z∈[-40,+75]mm · 7 of 31 slices shown, 9 images]
[im 4/31  brain]
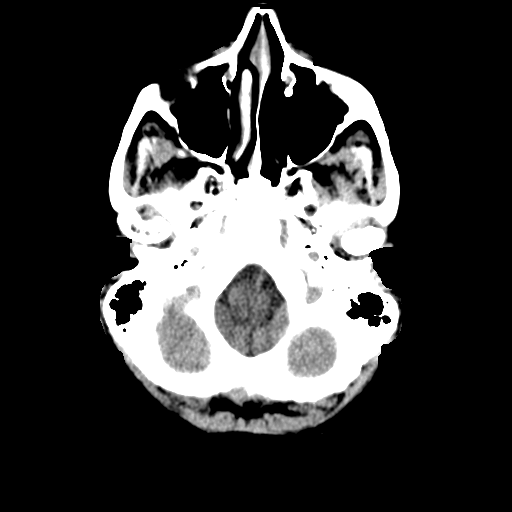
[im 4/31  bone]
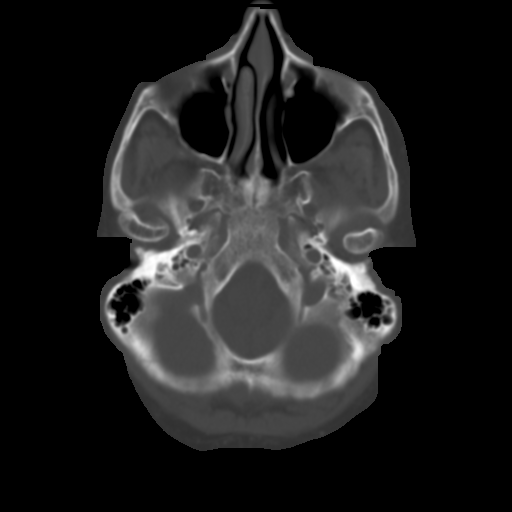
[im 8/31  brain]
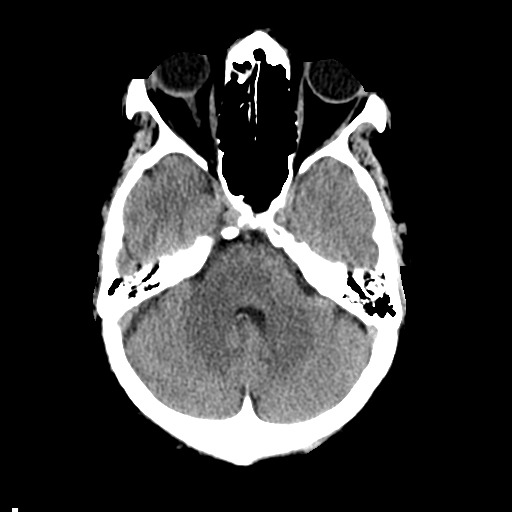
[im 12/31  brain]
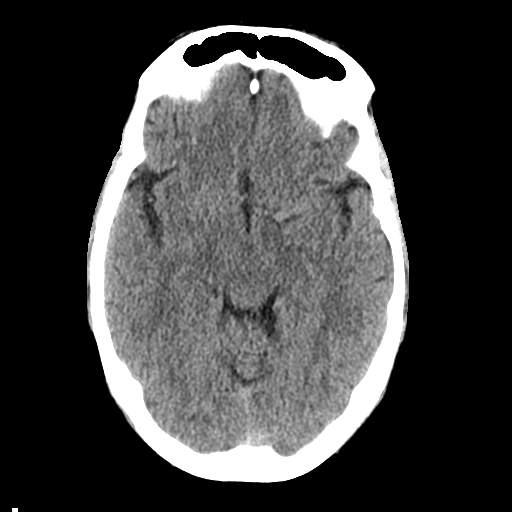
[im 16/31  brain]
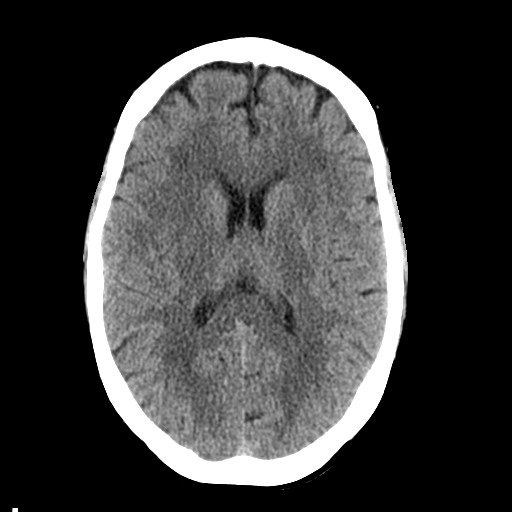
[im 19/31  brain]
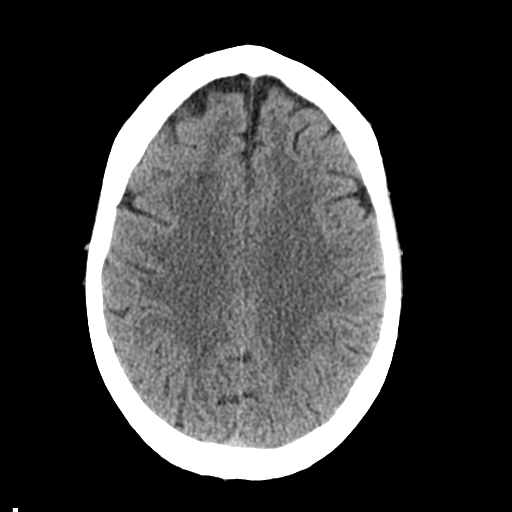
[im 19/31  bone]
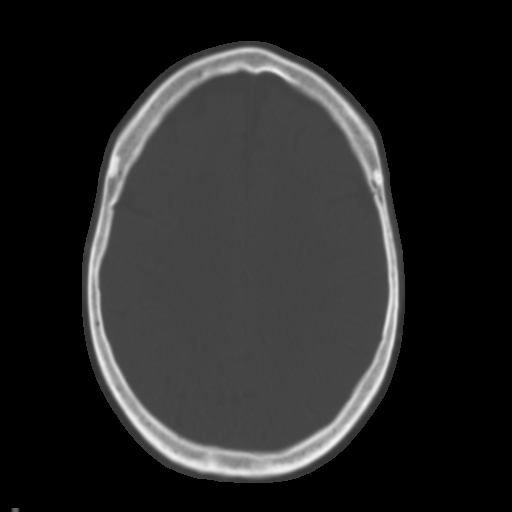
[im 23/31  brain]
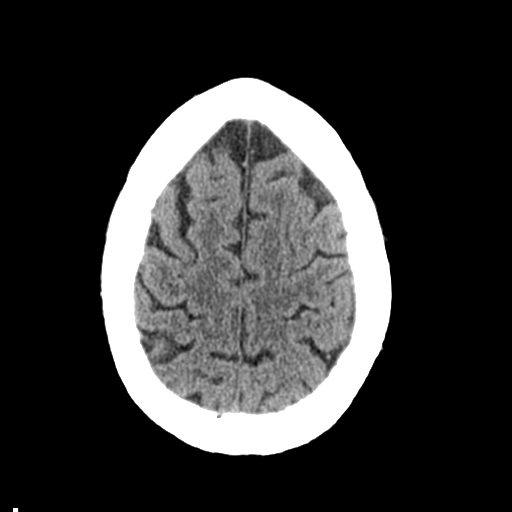
[im 27/31  brain]
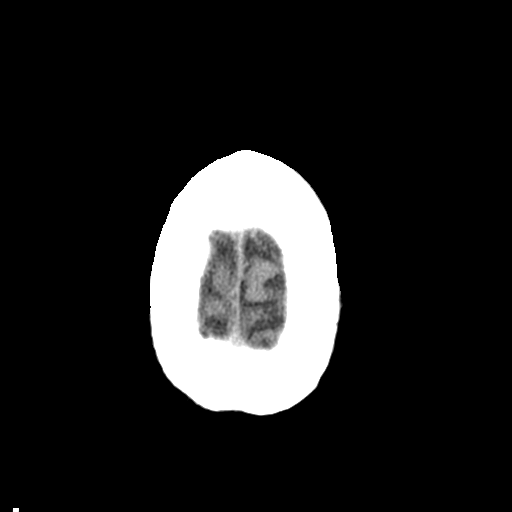

[Series 3: head bone · axial · 0.42mm/px · z∈[-41,-9]mm · 3 of 78 slices shown]
[im 8/78  bone]
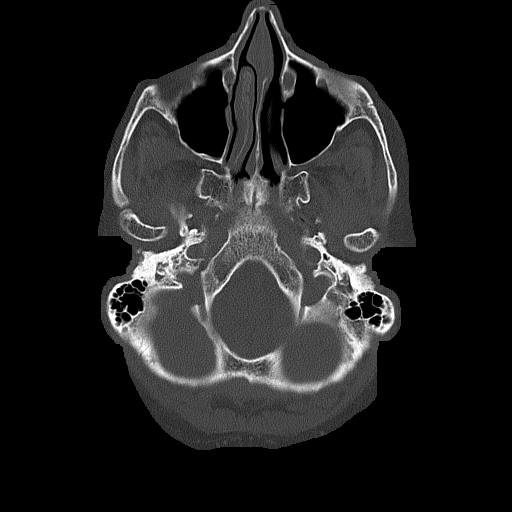
[im 16/78  bone]
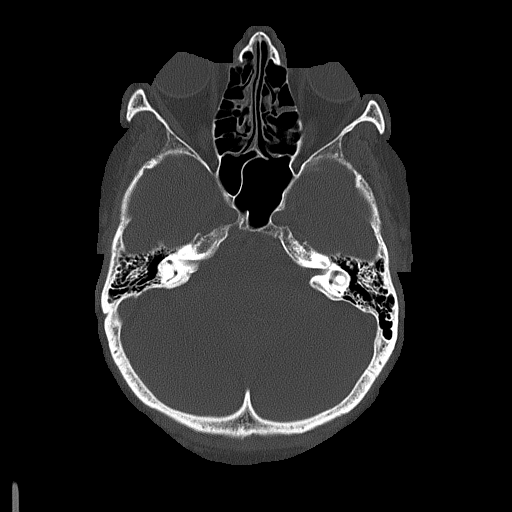
[im 24/78  bone]
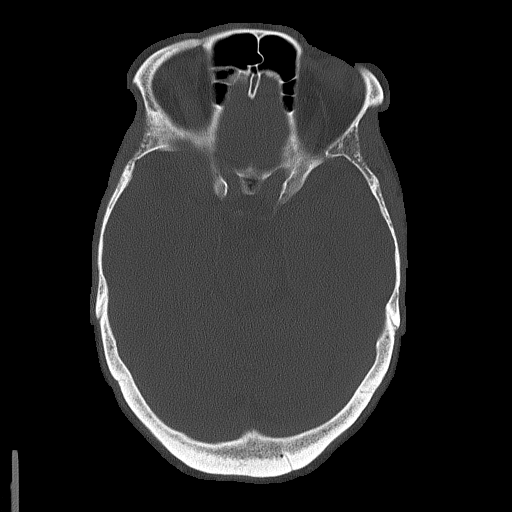

[Series 4: coronal soft tissue · coronal · 0.29mm/px · 3 of 68 slices shown]
[im 23/68  brain]
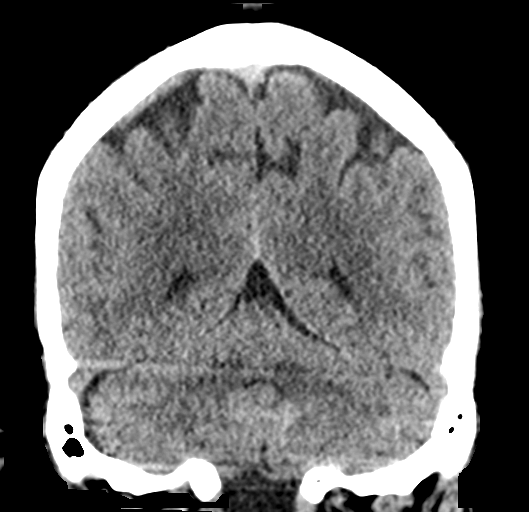
[im 30/68  brain]
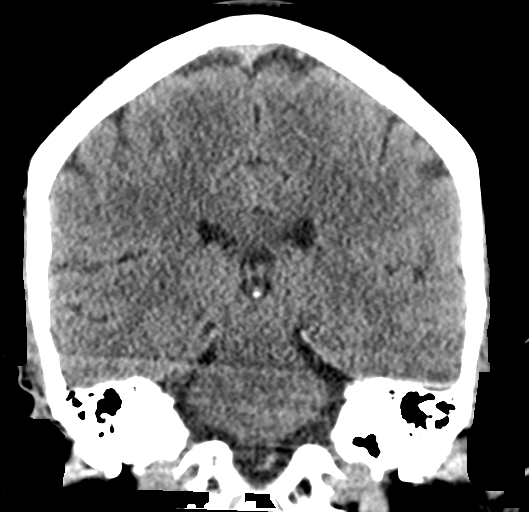
[im 38/68  brain]
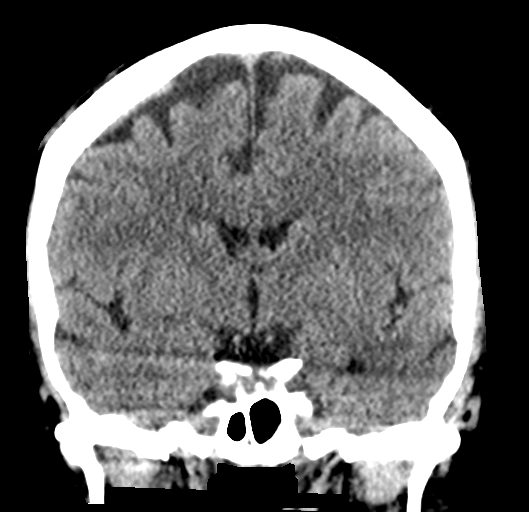

[Series 5: sagittal soft tissue · sagittal · 0.29mm/px · 3 of 55 slices shown]
[im 19/55  brain]
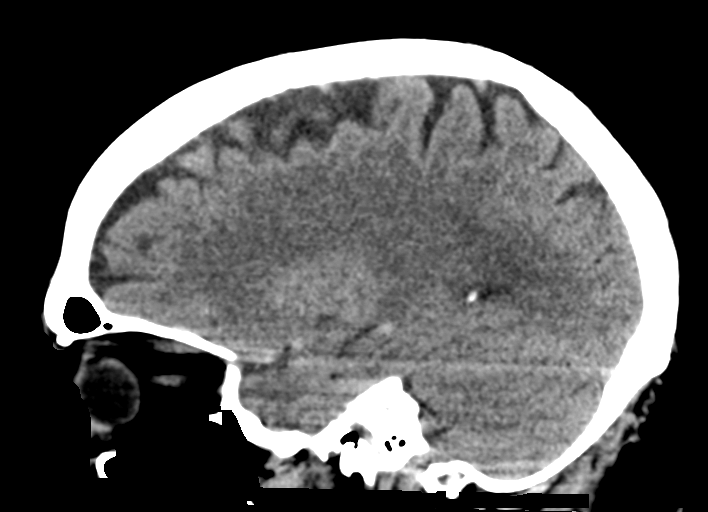
[im 28/55  brain]
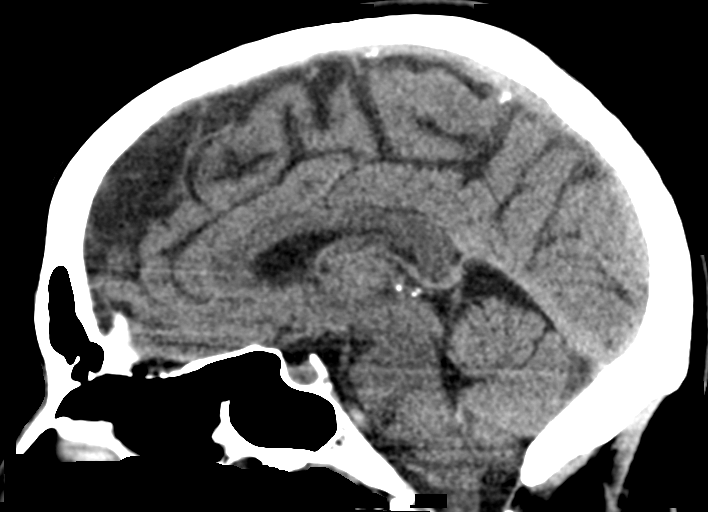
[im 37/55  brain]
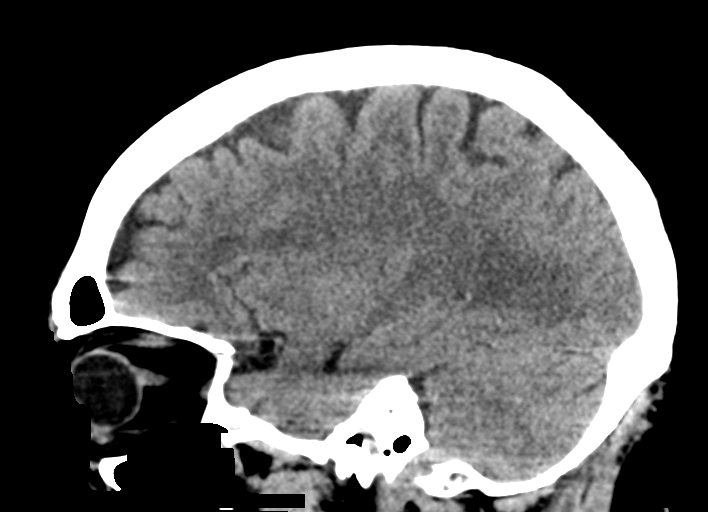

[16 of 47 positions shown; findings below may reference images not displayed]

FINDINGS: CT HEAD FINDINGS

Brain: No sign of acute infarction, mass lesion, hemorrhage,
hydrocephalus or extra-axial collection. Mild chronic small-vessel
ischemic changes affect the white matter.

Vascular: No abnormal vascular finding.

Skull: No skull fracture.

Sinuses/Orbits: Clear/negative

Other: None

CT CERVICAL SPINE FINDINGS

Alignment: No traumatic malalignment.

Skull base and vertebrae: Previous anterior fusion from C3 through
C7. Previous posterior fusion as well with screws and rods from C5
through C7. No sign of fracture.

Soft tissues and spinal canal: Negative

Disc levels: Solid fusion from C3 through C7. Facet osteoarthritis
at C2-3 right worse than left. Osteoarthritis at the C1-2
articulation but without encroachment upon the neural spaces.

Upper chest: Negative

Other: None
IMPRESSION: Head CT: No acute or traumatic finding. Chronic small-vessel changes
of the hemispheric white matter.

Cervical spine CT: No acute or traumatic finding. Chronic fusion C3
through C7.

## 2019-08-19 IMAGING — CT CT SHOULDER*R* W/O CM
1 series · 12 of 14 positions shown, 15 images · non-contrast
Comparison: X-ray [DATE]

CLINICAL DATA: Right shoulder pain after fall

EXAM:
CT OF THE UPPER RIGHT EXTREMITY WITHOUT CONTRAST
TECHNIQUE: Multidetector CT imaging of the upper right extremity was performed
according to the standard protocol.

[Series 7: ax st · axial · 0.42mm/px · z∈[-477,-315]mm · 12 of 102 slices shown, 15 images]
[im 8/102  soft-tissue]
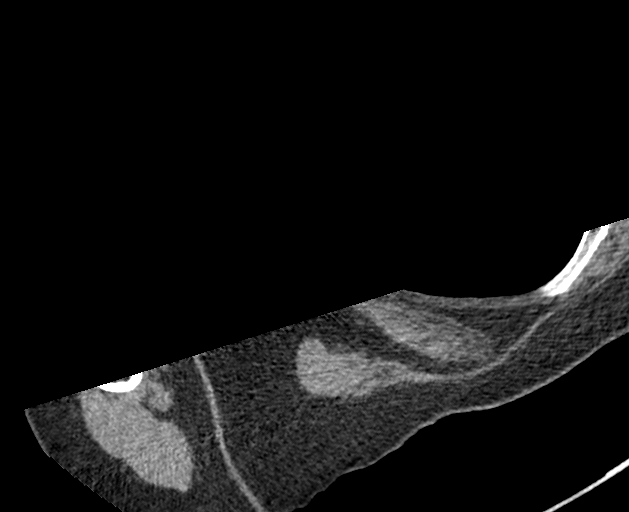
[im 8/102  bone]
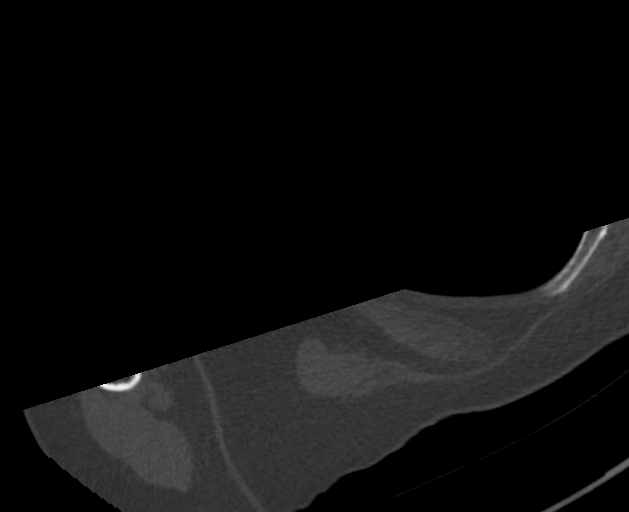
[im 16/102  bone]
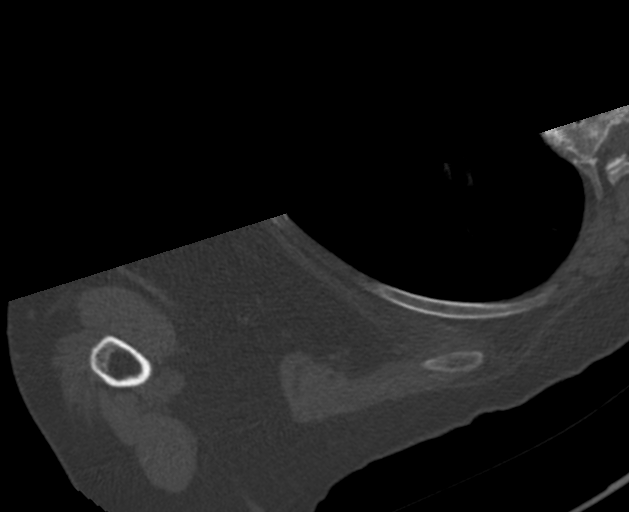
[im 24/102  bone]
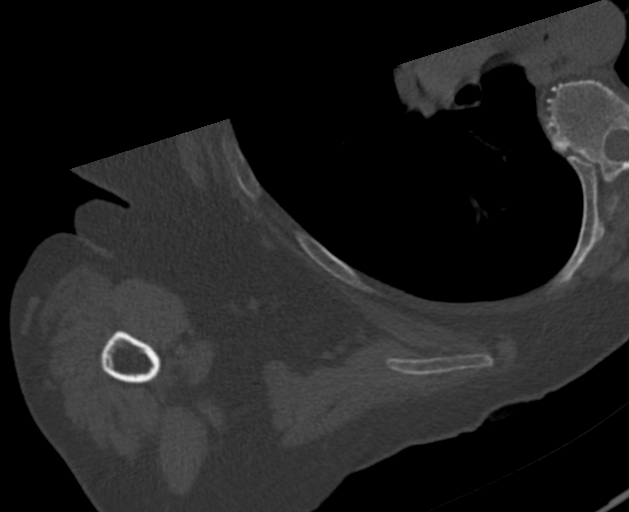
[im 32/102  bone]
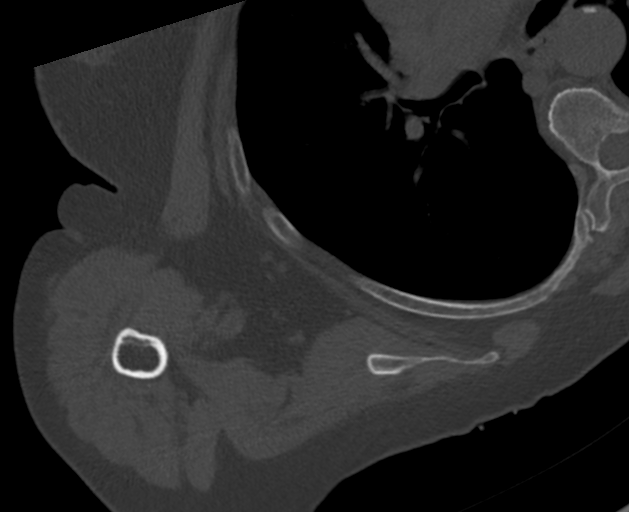
[im 39/102  soft-tissue]
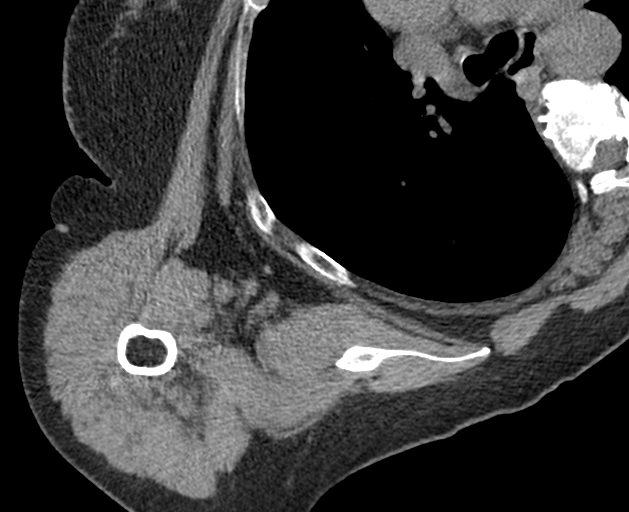
[im 39/102  bone]
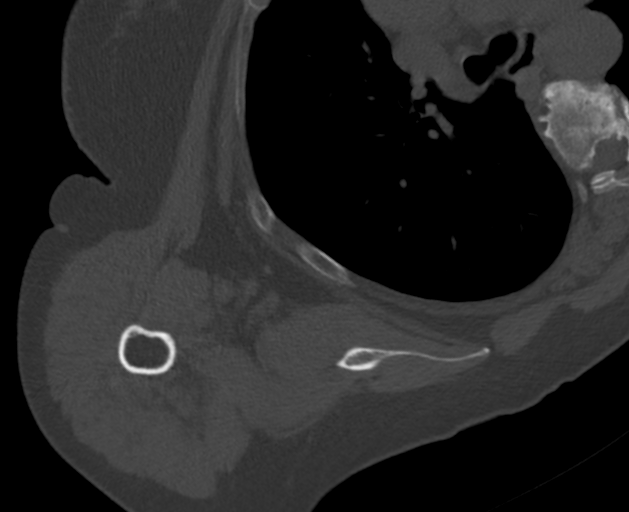
[im 47/102  bone]
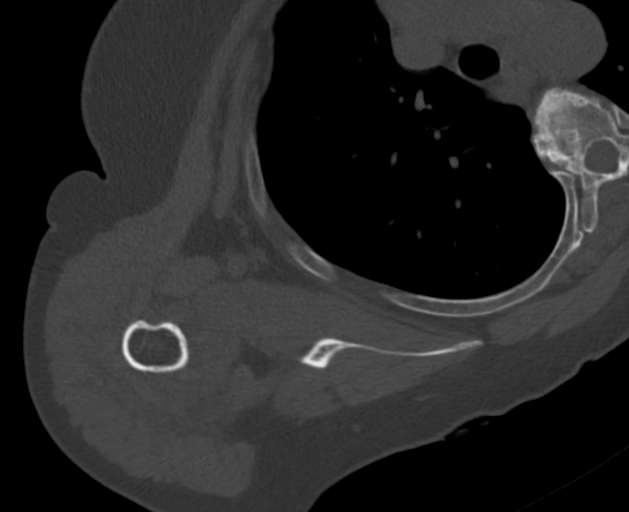
[im 55/102  bone]
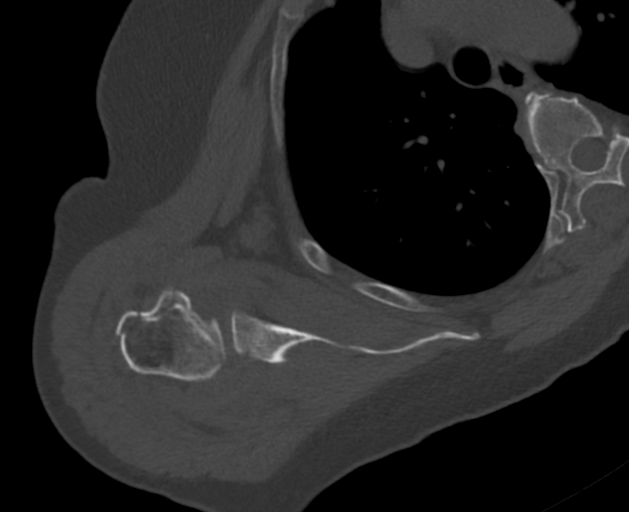
[im 63/102  bone]
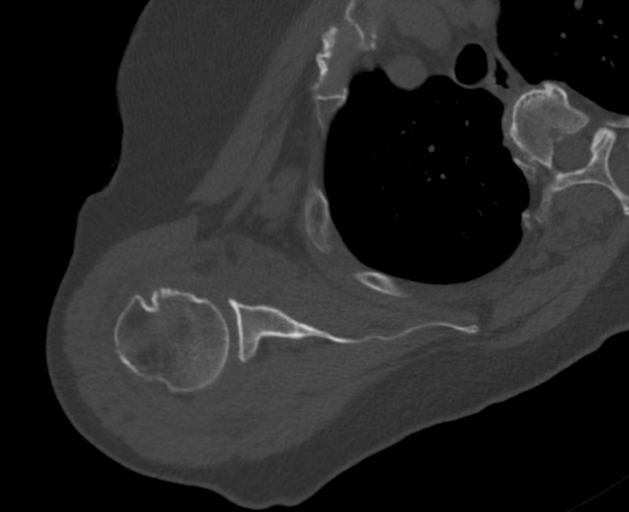
[im 70/102  soft-tissue]
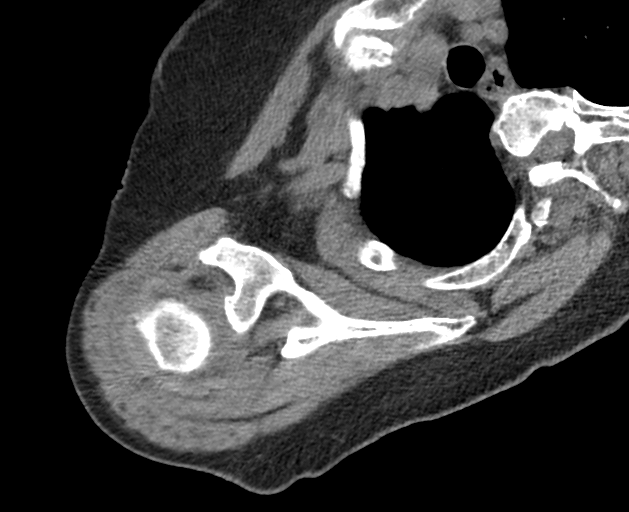
[im 70/102  bone]
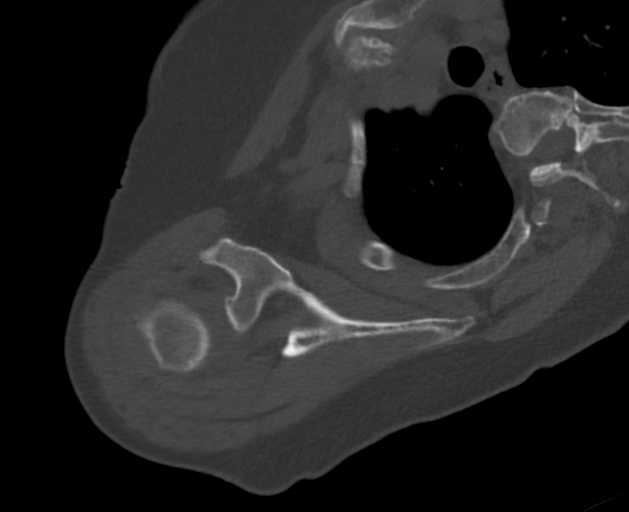
[im 78/102  bone]
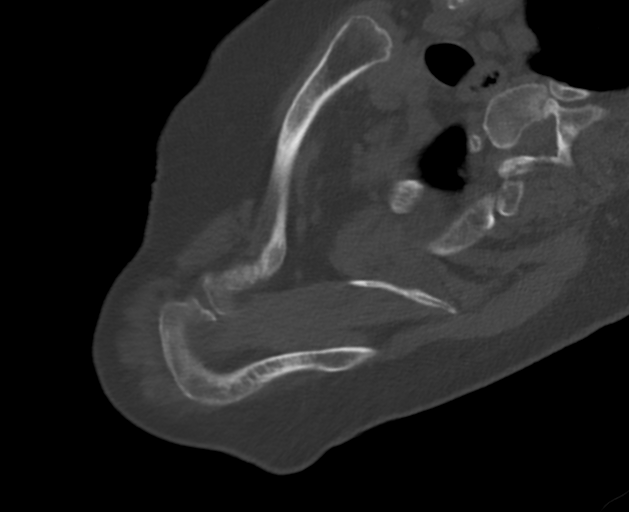
[im 86/102  bone]
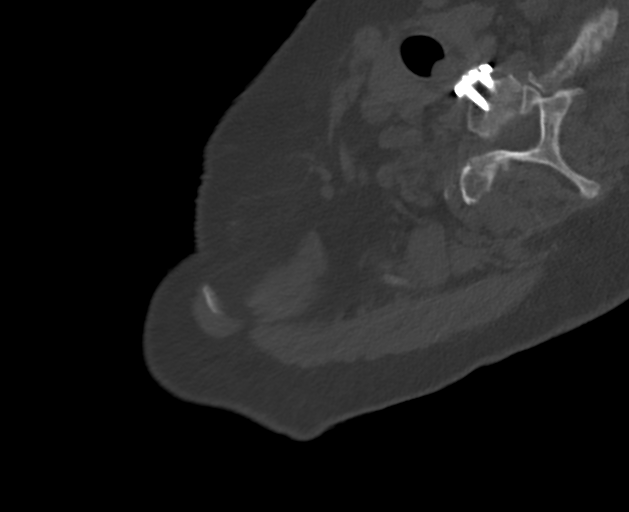
[im 94/102  bone]
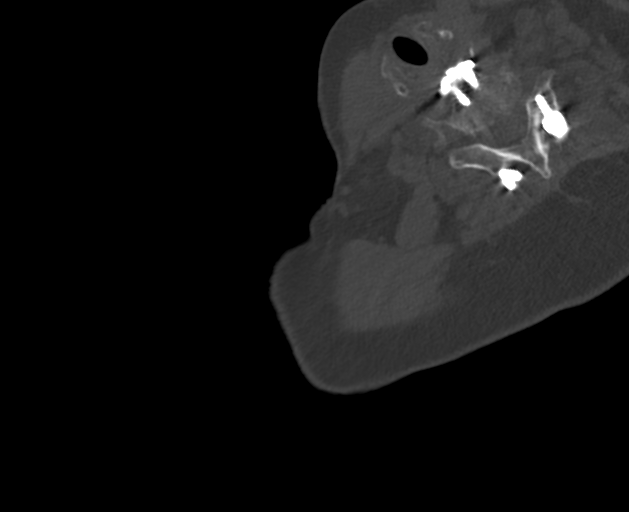

[12 of 14 positions shown; findings below may reference images not displayed]

FINDINGS: Bones/Joint/Cartilage

Acute fracture of the proximal right humerus head and neck with
nondisplaced fracture components involving the lesser tuberosity
(series 4, image 45) and greater tuberosity (series 6, image 20).
Minimal displacement of the anterior cortex of the surgical neck. No
transverse fracture component of the humeral neck. No fracture
involvement of the humeral head articular surface.

Glenohumeral joint alignment is maintained without dislocation.
Small glenohumeral joint effusion/hemarthrosis. AC joint is intact
with mild degenerative change. Remaining visualized osseous
structures are intact. Extensive anterior and posterior fusion
hardware within the visualized lower cervical spine.

Ligaments

Suboptimally assessed by CT.

Muscles and Tendons

Rotator cuff muscle bulk is maintained without significant atrophy
or fatty infiltration. No gross large rotator cuff tear.

Soft tissues

No soft tissue hematoma or fluid collection. The visualized right
lung is clear.
IMPRESSION: 1. Acute fracture of the proximal right humeral head and neck with
nondisplaced fracture components involving the lesser tuberosity and
greater tuberosity. Minimal displacement of the anterior cortex of
the surgical neck.
2. Small glenohumeral joint effusion/hemarthrosis.

## 2019-08-19 IMAGING — CR DG HUMERUS 2V *R*
1 series · 3 of 3 positions shown · non-contrast
Comparison: No pertinent prior studies available for comparison.

CLINICAL DATA: Fall, painful.

EXAM:
RIGHT HUMERUS - 2+ VIEW

[Series 1: dg humerus right · 0.14mm/px · 3 of 3 slices shown]
[im 1/3]
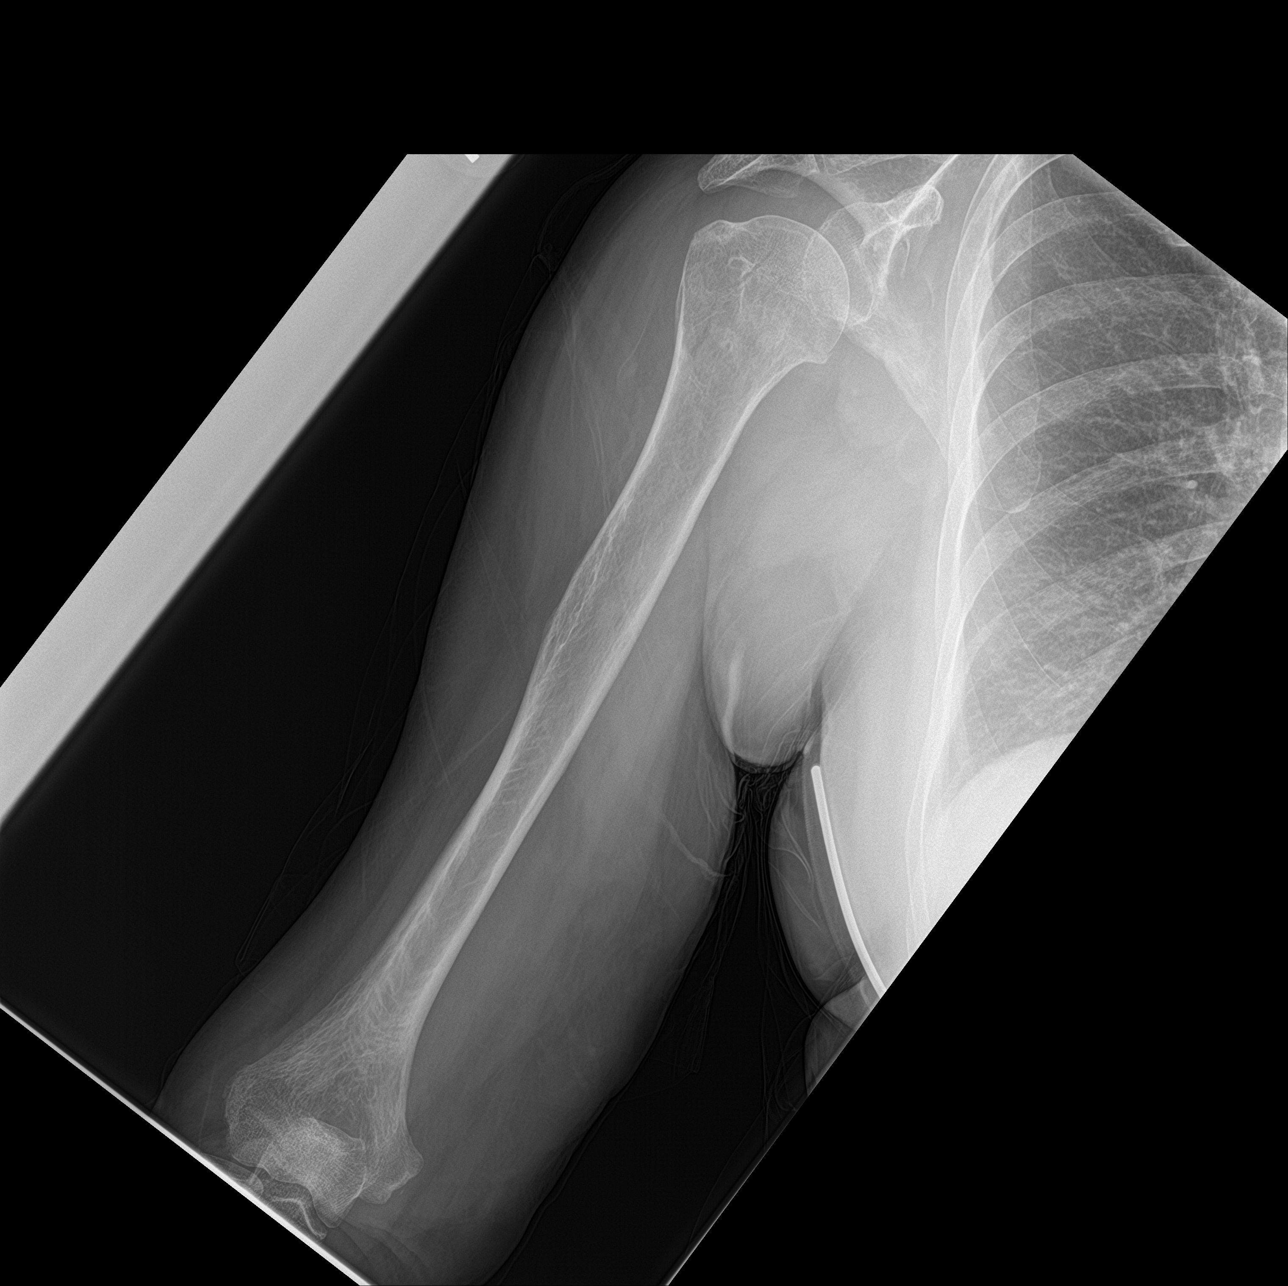
[im 2/3]
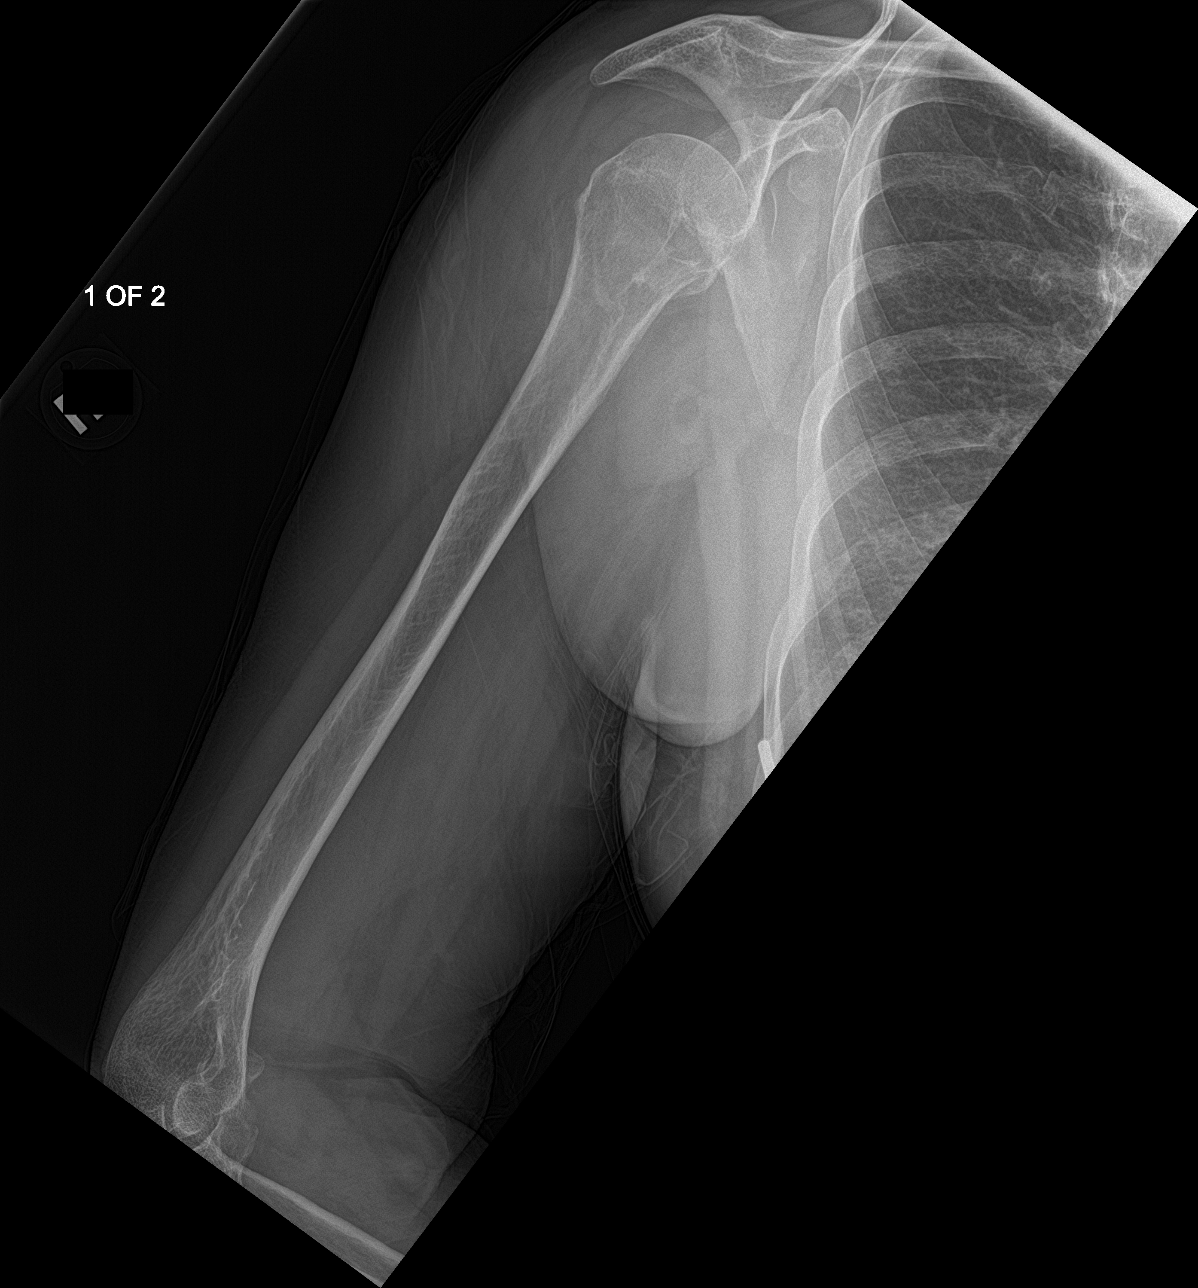
[im 3/3]
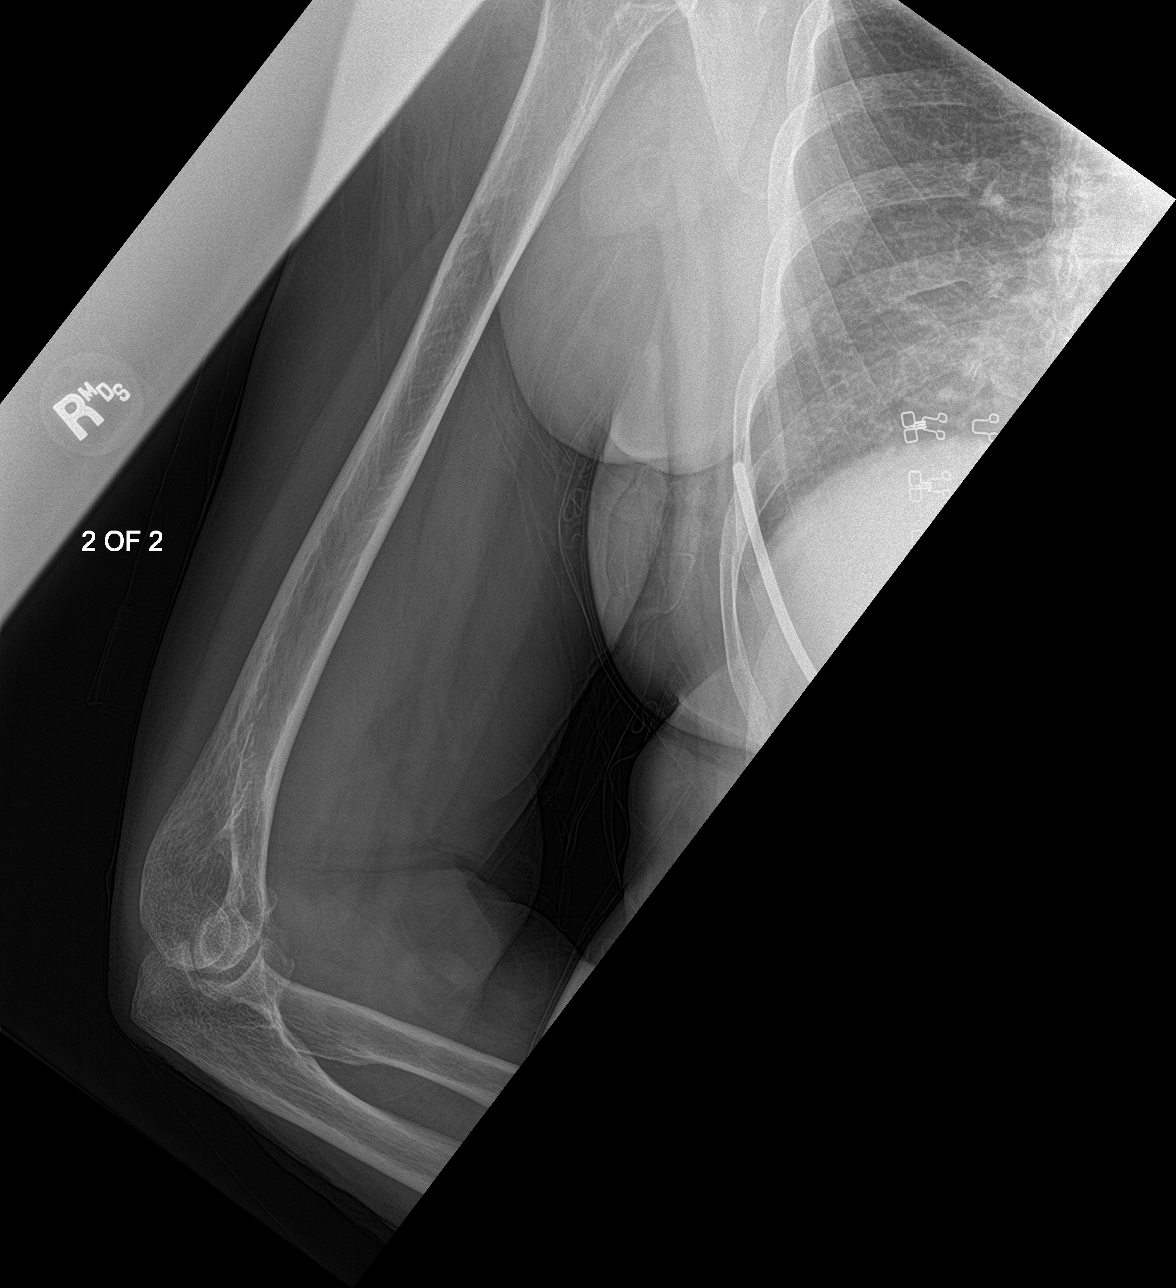

[3 of 3 positions shown; findings below may reference images not displayed]

FINDINGS: There is normal bony alignment.

Irregular curvilinear lucency projecting over the right humeral
head/surgical neck, seen only on one view, concerning for
nondisplaced fracture. Acromioclavicular degenerative change.
IMPRESSION: Irregular curvilinear lucency projecting over the right humeral
head/surgical neck, seen only on one view, concerning for
nondisplaced fracture. CT recommended for further evaluation.

## 2019-08-19 IMAGING — CT CT CERVICAL SPINE W/O CM
3 series · 11 of 33 positions shown, 13 images · non-contrast
Comparison: None.

CLINICAL DATA: Fell with trauma to the head and neck.

EXAM:
CT HEAD WITHOUT CONTRAST
CT CERVICAL SPINE WITHOUT CONTRAST
TECHNIQUE: Multidetector CT imaging of the head and cervical spine was
performed following the standard protocol without intravenous
contrast. Multiplanar CT image reconstructions of the cervical spine
were also generated.

[Series 3: c spine soft (person_name) · axial · 0.38mm/px · z∈[-159,-63]mm · 3 of 79 slices shown, 4 images]
[im 19/79  soft-tissue]
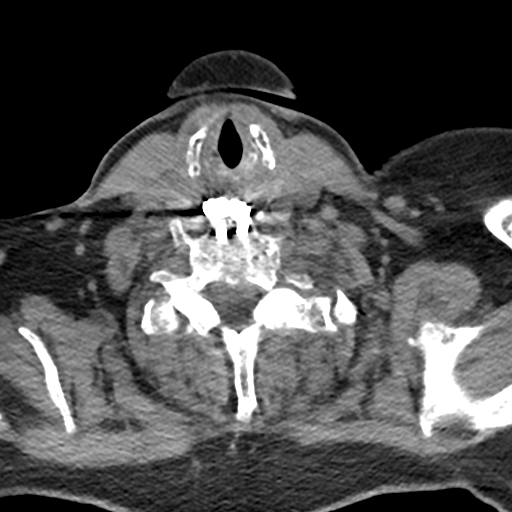
[im 19/79  bone]
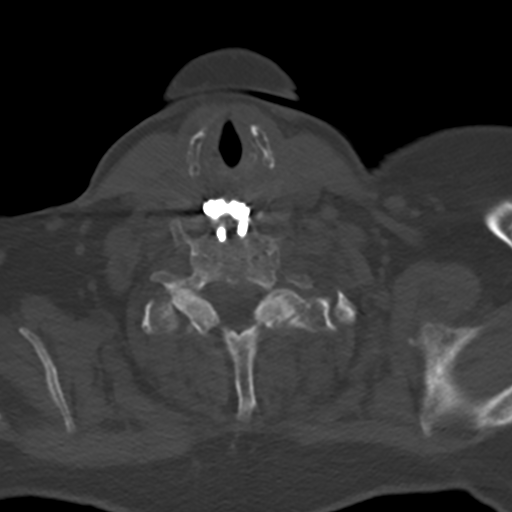
[im 43/79  bone]
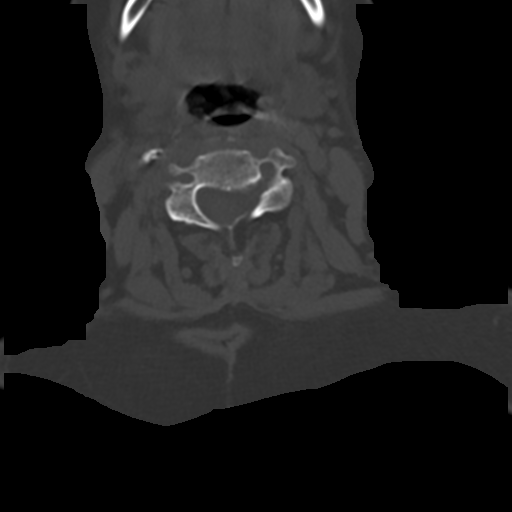
[im 67/79  bone]
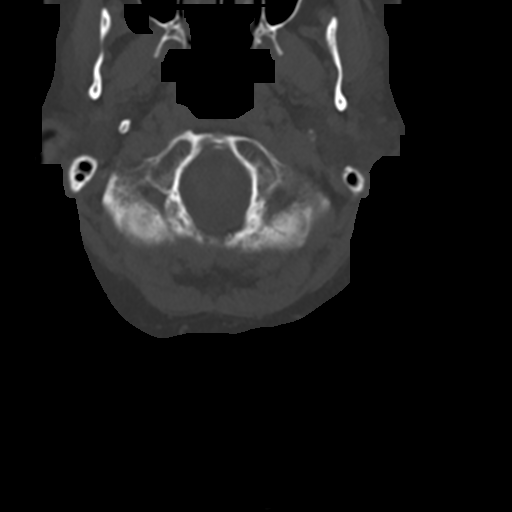

[Series 6: sagittal bone · sagittal · 0.28mm/px · 5 of 56 slices shown, 6 images]
[im 19/56  bone]
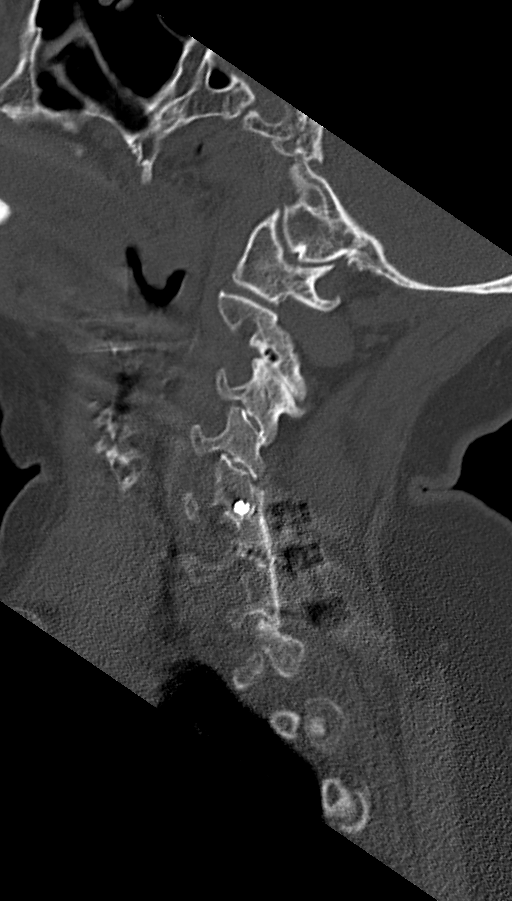
[im 23/56  bone]
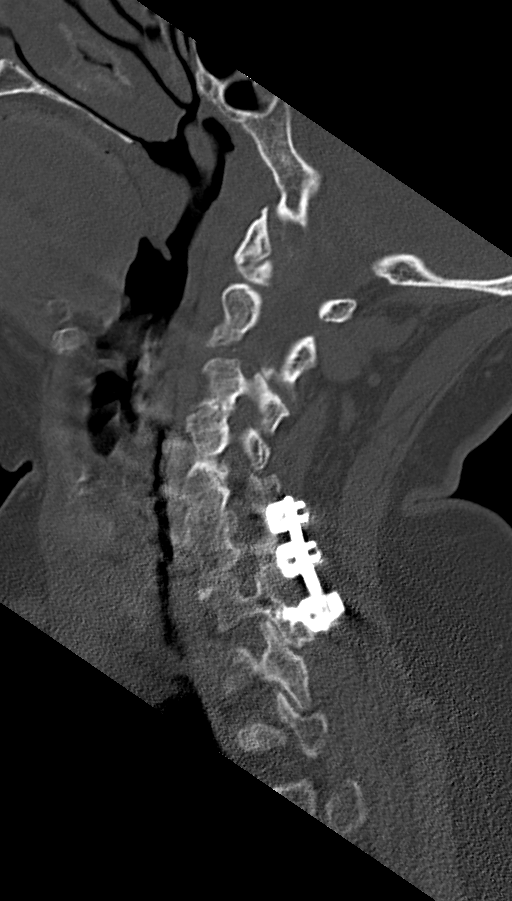
[im 28/56  soft-tissue]
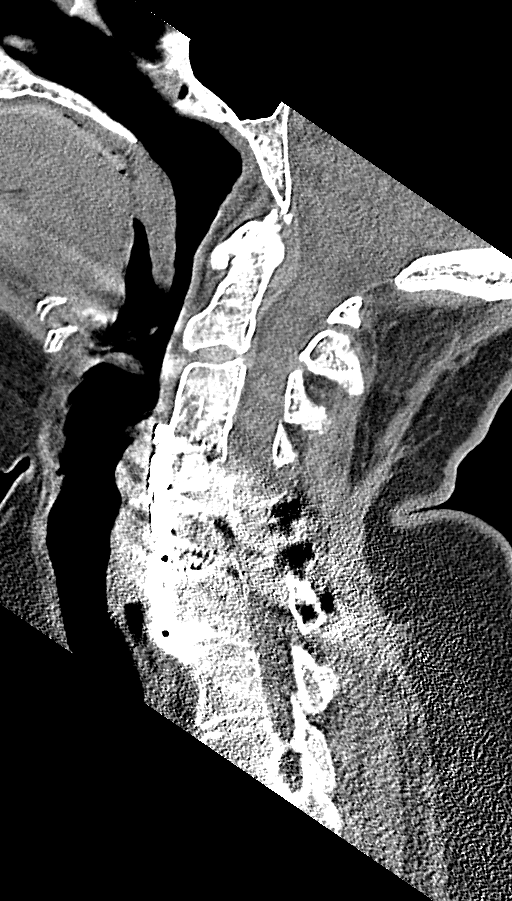
[im 28/56  bone]
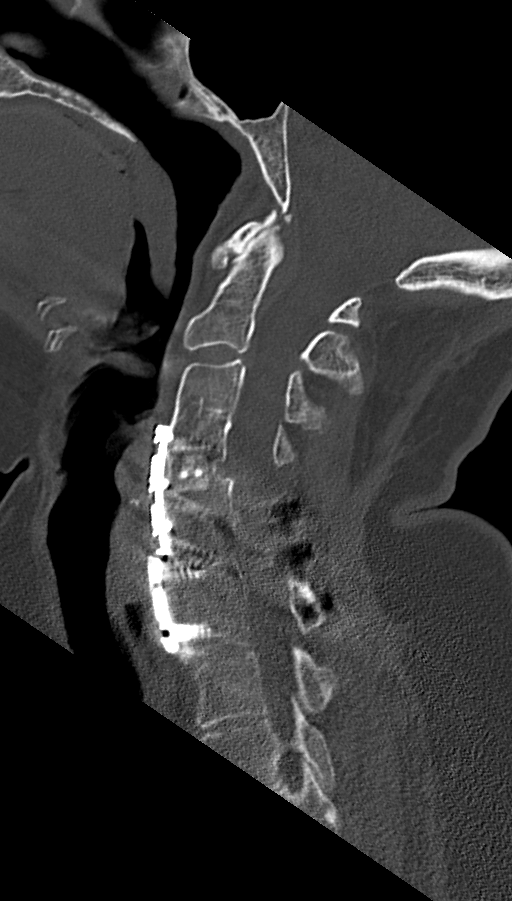
[im 33/56  bone]
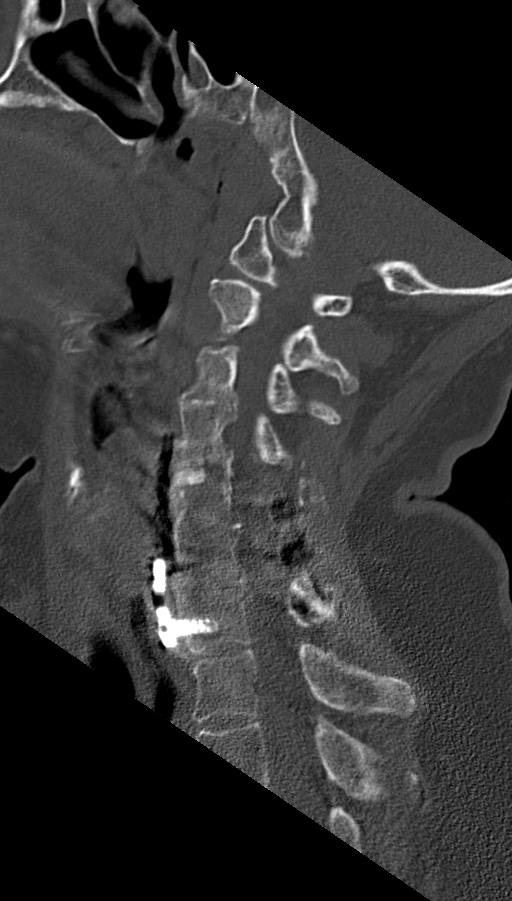
[im 37/56  bone]
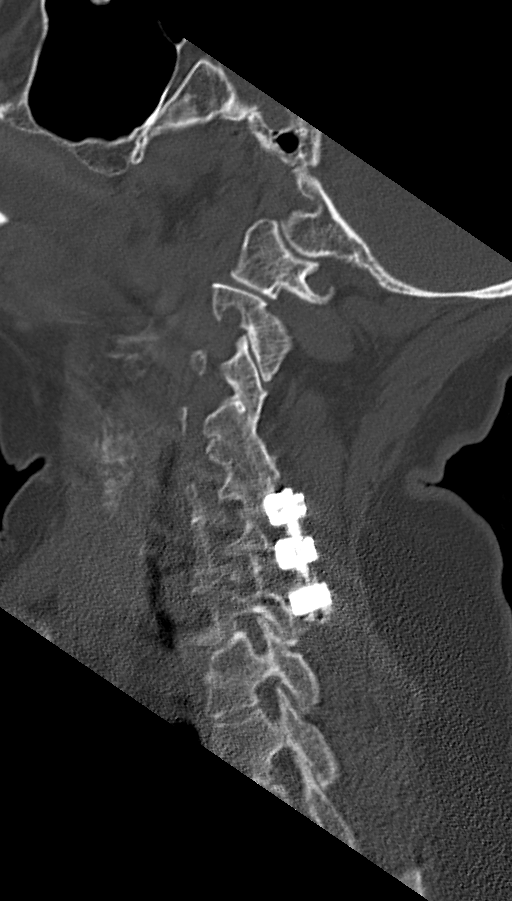

[Series 7: coronal bone · coronal · 0.28mm/px · 3 of 70 slices shown]
[im 22/70  bone]
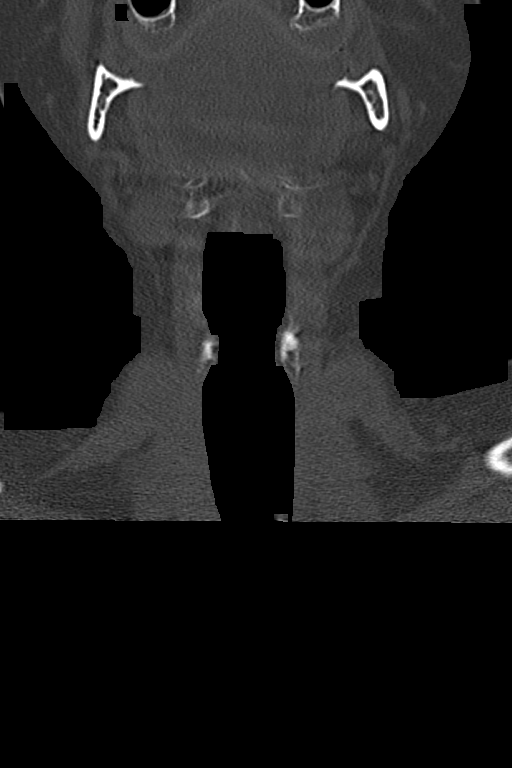
[im 31/70  bone]
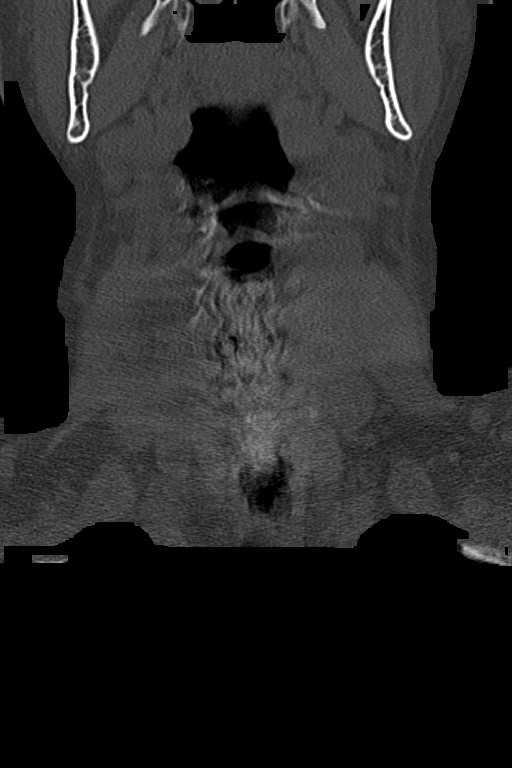
[im 40/70  bone]
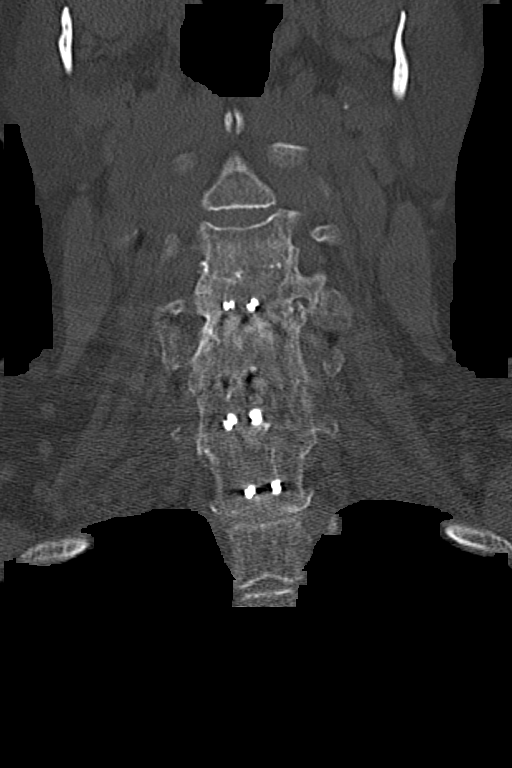

[11 of 33 positions shown; findings below may reference images not displayed]

FINDINGS: CT HEAD FINDINGS

Brain: No sign of acute infarction, mass lesion, hemorrhage,
hydrocephalus or extra-axial collection. Mild chronic small-vessel
ischemic changes affect the white matter.

Vascular: No abnormal vascular finding.

Skull: No skull fracture.

Sinuses/Orbits: Clear/negative

Other: None

CT CERVICAL SPINE FINDINGS

Alignment: No traumatic malalignment.

Skull base and vertebrae: Previous anterior fusion from C3 through
C7. Previous posterior fusion as well with screws and rods from C5
through C7. No sign of fracture.

Soft tissues and spinal canal: Negative

Disc levels: Solid fusion from C3 through C7. Facet osteoarthritis
at C2-3 right worse than left. Osteoarthritis at the C1-2
articulation but without encroachment upon the neural spaces.

Upper chest: Negative

Other: None
IMPRESSION: Head CT: No acute or traumatic finding. Chronic small-vessel changes
of the hemispheric white matter.

Cervical spine CT: No acute or traumatic finding. Chronic fusion C3
through C7.

## 2019-08-19 MED ORDER — OXYCODONE HCL 5 MG PO TABS: 5.0000 mg | ORAL_TABLET | Freq: Three times a day (TID) | ORAL | 0 refills | Status: DC

## 2019-08-19 NOTE — ED Notes (Signed)
Right arm pain post mechnical fall after tripping over her dog. Denies LOC or hitting head. Even and non labored respirations noted.

## 2019-08-19 NOTE — Discharge Instructions (Addendum)
Follow-up with Dr.Poggi, please call for an appointment.  Also call your pain clinic physician to let them know we have given you a prescription for additional narcotic for breakthrough pain.  Make sure he approves this prior to getting this prescription filled it would not delete your pain contract.  Return to the emergency department if any complication or sooner if you are worsening.  Wear the sling until seen by orthopedics.  You can take it off only to shower.

## 2019-08-19 NOTE — ED Provider Notes (Signed)
[facility] Emergency Department Provider Note  ____________________________________________   First MD Initiated Contact with Patient 08/19/19 0802     (approximate)  I have reviewed the triage vital signs and the nursing notes.   HISTORY  Chief Complaint Arm Injury and Fall    HPI Teresa Lang is a 63 y.o. female presents emergency department after falling and tripping over her dog.  She landed on her right side.  Complaining of right upper arm pain.  No LOC but patient is on blood thinners.  She denies headache at this time.  She is complaining of neck pain.    Past Medical History:  Diagnosis Date  . Allergy    seasonal  . Anxiety   . Arthritis   . Depression   . Hyperlipidemia   . Hypertension     Patient Active Problem List   Diagnosis Date Noted  . Alcohol abuse 03/21/2019  . MDD (major depressive disorder), recurrent, severe, with psychosis ([place]) 03/20/2019  . Angina at rest [facility]) 12/08/2018  . MDD (major depressive disorder), recurrent severe, without psychosis ([place]) 11/14/2018  . Anxiety 11/14/2018  . Tobacco use disorder 10/16/2018  . Chest pain at rest 04/15/2018  . Myofascial pain dysfunction syndrome 03/21/2018  . Cervico-occipital neuralgia of right side 03/21/2018  . Cervical post-laminectomy syndrome 02/18/2018  . Lumbar spondylosis 02/18/2018  . Encounter for long-term (current) use of high-risk medication 02/15/2018  . Chronic upper back pain (Primary Area of Pain) (right) 01/02/2018  . Occipital neuralgia of right side (Secondary Area of Pain) 01/02/2018  . Chronic neck pain (Tertiary Area of Pain)(R) 01/02/2018  . Chronic bilateral low back pain without sciatica (Fourth Area of Pain) 01/02/2018  . Chronic generalized pain 01/02/2018  . Chronic pain syndrome 01/02/2018  . Long term current use of opiate analgesic 01/02/2018  . Pharmacologic therapy 01/02/2018  . Disorder of skeletal system 01/02/2018  .  Problems influencing health status 01/02/2018  . PTSD (post-traumatic stress disorder) 12/14/2017  . Fibromyalgia 12/14/2017  . Hyperlipemia, mixed 12/14/2017  . Hypertension, essential 12/14/2017  . Major depressive disorder, recurrent episode, moderate degree ([place]) 12/14/2017  . Pain of cervical spine 12/14/2017  . Rheumatoid arthritis ([place]) 12/14/2017  . Rheumatoid arthritis involving multiple sites with positive rheumatoid factor ([place]) 12/14/2017    Past Surgical History:  Procedure Laterality Date  . ABDOMINAL HYSTERECTOMY  1983  . CHOLECYSTECTOMY  1983  . SPINE SURGERY  beginning 1994   laminectomies, fusions    Prior to Admission medications   Medication Sig Start Date End Date Taking? Authorizing Provider  albuterol (VENTOLIN HFA) 108 (90 Base) MCG/ACT inhaler Inhale 2 puffs into the lungs every 6 (six) hours as needed for wheezing or shortness of breath. 03/27/19   Clapacs, Madie Reno, MD  aspirin EC 81 MG tablet Take 1 tablet (81 mg total) by mouth daily. 03/27/19   Clapacs, Madie Reno, MD  atorvastatin (LIPITOR) 40 MG tablet Take 1 tablet (40 mg total) by mouth every evening. 03/27/19   Clapacs, Madie Reno, MD  clonazePAM (KLONOPIN) 1 MG tablet Take 1 tablet (1 mg total) by mouth 2 (two) times daily as needed for up to 30 days for anxiety. 03/27/19 04/26/19  Clapacs, Madie Reno, MD  cyclobenzaprine (FLEXERIL) 5 MG tablet Take 1 tablet (5 mg total) by mouth 3 (three) times daily as needed for muscle spasms. 03/27/19   Clapacs, Madie Reno, MD  FLUoxetine (PROZAC) 20 MG capsule TAKE 3 CAPSULES BY MOUTH DAILY 04/18/19  Clapacs, Jackquline Denmark, MD  fluticasone furoate-vilanterol (BREO ELLIPTA) 100-25 MCG/INH AEPB Inhale 1 puff into the lungs daily. 03/27/19   Clapacs, Jackquline Denmark, MD  folic acid (FOLVITE) 1 MG tablet Take 1 tablet (1 mg total) by mouth daily. 03/27/19   Clapacs, Jackquline Denmark, MD  isosorbide mononitrate (IMDUR) 30 MG 24 hr tablet Take 1 tablet (30 mg total) by mouth 2 (two) times daily. 03/27/19   Clapacs, Jackquline Denmark, MD  lisinopril (ZESTRIL) 20 MG tablet Take 1 tablet (20 mg total) by mouth daily. 03/27/19   Clapacs, Jackquline Denmark, MD  lithium carbonate 300 MG capsule Take 1 capsule (300 mg total) by mouth at bedtime. 03/27/19   Clapacs, Jackquline Denmark, MD  methotrexate (RHEUMATREX) 2.5 MG tablet Take 15 mg by mouth once a week.  10/16/18   [provider]  metoprolol tartrate (LOPRESSOR) 25 MG tablet Take 1 tablet (25 mg total) by mouth 2 (two) times daily. 03/27/19   Clapacs, Jackquline Denmark, MD  olopatadine (PATANOL) 0.1 % ophthalmic solution Place 1 drop into both eyes 2 (two) times daily. 03/27/19   Clapacs, Jackquline Denmark, MD  oxyCODONE (OXY IR/ROXICODONE) 5 MG immediate release tablet Take 1 tablet (5 mg total) by mouth 3 (three) times daily. 08/19/19   Marilynn Ekstein, Roselyn Bering, PA-C  pantoprazole (PROTONIX) 40 MG tablet Take 1 tablet (40 mg total) by mouth daily. 03/27/19   Clapacs, Jackquline Denmark, MD  traZODone (DESYREL) 100 MG tablet Take 1 tablet (100 mg total) by mouth at bedtime. 03/27/19   Clapacs, Jackquline Denmark, MD  umeclidinium bromide (INCRUSE ELLIPTA) 62.5 MCG/INH AEPB Inhale 1 puff into the lungs daily. 03/27/19   Clapacs, Jackquline Denmark, MD    Allergies Prednisone  Family History  Problem Relation Age of Onset  . Celiac disease Sister   . Breast cancer Neg Hx     Social History Social History   Tobacco Use  . Smoking status: Current Some Day Smoker    Packs/day: 0.25    Years: 57.00    Pack years: 14.25    Types: Cigarettes    Start date: 64  . Smokeless tobacco: Never Used  . Tobacco comment: Also VAPES on a daily basis  Substance Use Topics  . Alcohol use: Never    Frequency: Never  . Drug use: Yes    Frequency: 7.0 times per week    Types: Marijuana    Review of Systems  Constitutional: No fever/chills Eyes: No visual changes. ENT: No sore throat. Respiratory: Denies cough Genitourinary: Negative for dysuria. Musculoskeletal: Negative for back pain.  Right arm and neck pain Skin: Negative for  rash.    ____________________________________________   PHYSICAL EXAM:  VITAL SIGNS: ED Triage Vitals  Enc Vitals Group     BP 08/19/19 0757 132/81     Pulse Rate 08/19/19 0757 92     Resp 08/19/19 0757 20     Temp 08/19/19 0754 99.3 F (37.4 C)     Temp Source 08/19/19 0754 Oral     SpO2 08/19/19 0757 93 %     Weight 08/19/19 0754 187 lb (84.8 kg)     Height 08/19/19 0754 5\' 5"  (1.651 m)     Head Circumference --      Peak Flow --      Pain Score 08/19/19 0754 10     Pain Loc --      Pain Edu? --      Excl. in GC? --     Constitutional: Alert  and oriented. Well appearing and in no acute distress. Eyes: Conjunctivae are normal.  Head: Atraumatic. Nose: No congestion/rhinnorhea. Mouth/Throat: Mucous membranes are moist.   Neck:  supple no lymphadenopathy noted Cardiovascular: Normal rate, regular rhythm.  Respiratory: Normal respiratory effort.  No retractions,  GU: deferred Musculoskeletal: Decreased range of motion of the right arm, humerus is tender to palpation, C-spine is little tender, neurovascular is intact neurologic:  Normal speech and language.  Skin:  Skin is warm, dry and intact. No rash noted. Psychiatric: Mood and affect are normal. Speech and behavior are normal.  ____________________________________________   LABS (all labs ordered are listed, but only abnormal results are displayed)  Labs Reviewed - No data to display ____________________________________________   ____________________________________________  RADIOLOGY  X-ray of the right humerus ? Fracture at humeral head Due to anticoagulants CT of the head : is negative CT of cervical spine is negative Ct shoulder shows fracture of humeral head and surgical neck  ____________________________________________   PROCEDURES  Procedure(s) performed: Sling was applied by nursing staff   Procedures    ____________________________________________   INITIAL IMPRESSION / ASSESSMENT  AND PLAN / ED COURSE  Pertinent labs & imaging results that were available during my care of the patient were reviewed by me and considered in my medical decision making (see chart for details).   Patient 63 year old female presents emergency department after fall.  See HPI  Physical exam shows patient to be tender at the right humerus.  C-spine is tender.  Remainder of exam is unremarkable  X-ray of the right humerus, CT of the head and C-spine  X-ray of the right humerus shows a questionable fracture, radiologist like CT, CT of the head and C-spine are both negative CT of the right shoulder does show a humeral head and surgical neck fracture.  Explained all the findings to the patient and her caregiver.  She was placed in a sling.  She is to follow-up with Dr.Poggi as the patient is requesting him.  She is on a pain contract she was given 10 additional oxycodone for breakthrough pain.  She is to call her regular physician to notify them that she has been given a narcotic prescription.  She is not to fill until they have approved so this will not delete her pain contract.  She and her caregiver both state they understand.  She is to return emergency department if any worsening symptoms.  She was discharged stable condition.    Leeroy Changela McLaughlin Salone was evaluated in Emergency Department on 08/19/2019 for the symptoms described in the history of present illness. She was evaluated in the context of the global COVID-19 pandemic, which necessitated consideration that the patient might be at risk for infection with the SARS-CoV-2 virus that causes COVID-19. Institutional protocols and algorithms that pertain to the evaluation of patients at risk for COVID-19 are in a state of rapid change based on information released by regulatory bodies including the CDC and federal and state organizations. These policies and algorithms were followed during the patient's care in the ED.   As part of my medical  decision making, I reviewed the following data within the electronic MEDICAL RECORD NUMBER History obtained from family, Nursing notes reviewed and incorporated, Old chart reviewed, Radiograph reviewed see above, Notes from prior ED visits and [place] Controlled Substance Database  ____________________________________________   FINAL CLINICAL IMPRESSION(S) / ED DIAGNOSES  Final diagnoses:  Closed fracture of head of right humerus, initial encounter      NEW  MEDICATIONS STARTED DURING THIS VISIT:  Discharge Medication List as of 08/19/2019  9:36 AM       Note:  This document was prepared using Dragon voice recognition software and may include unintentional dictation errors.    Faythe Ghee, PA-C 08/19/19 1044    Chesley Noon, MD 08/20/19 [phone]

## 2019-08-19 NOTE — ED Triage Notes (Signed)
Pt in via EMS from home with c/o falling. EMS reports pt tripped over the dig and fell and landed on her right arm. Pt with pain in her right upper arm. Pt heard a pop. No obvious swelling or redness noted. Pt with range of motion but painful with movement. 169/95, 96%RA, HR 95, FSBS 146.

## 2019-08-19 NOTE — ED Triage Notes (Signed)
Pt reports tripped over her dog and landed on her right arm. Pt denies LOC. Pt states she came down on her right arm. Pt point below the shoulder of the area with the most pain.

## 2019-08-25 DIAGNOSIS — S42201A Unspecified fracture of upper end of right humerus, initial encounter for closed fracture: Secondary | ICD-10-CM | POA: Insufficient documentation

## 2019-10-14 ENCOUNTER — Encounter: Payer: Self-pay | Admitting: Emergency Medicine

## 2019-10-14 ENCOUNTER — Other Ambulatory Visit: Payer: Self-pay

## 2019-10-14 ENCOUNTER — Emergency Department: Payer: Medicare HMO

## 2019-10-14 ENCOUNTER — Emergency Department
Admission: EM | Admit: 2019-10-14 | Discharge: 2019-10-14 | Disposition: A | Discharge status: Home / Self Care | Payer: Medicare HMO | Attending: Emergency Medicine | Admitting: Emergency Medicine

## 2019-10-14 DIAGNOSIS — Z87891 Personal history of nicotine dependence: Secondary | ICD-10-CM | POA: Diagnosis not present

## 2019-10-14 DIAGNOSIS — Y929 Unspecified place or not applicable: Secondary | ICD-10-CM | POA: Diagnosis not present

## 2019-10-14 DIAGNOSIS — I10 Essential (primary) hypertension: Secondary | ICD-10-CM | POA: Diagnosis not present

## 2019-10-14 DIAGNOSIS — M533 Sacrococcygeal disorders, not elsewhere classified: Secondary | ICD-10-CM

## 2019-10-14 DIAGNOSIS — S3210XA Unspecified fracture of sacrum, initial encounter for closed fracture: Secondary | ICD-10-CM | POA: Insufficient documentation

## 2019-10-14 DIAGNOSIS — Y939 Activity, unspecified: Secondary | ICD-10-CM | POA: Diagnosis not present

## 2019-10-14 DIAGNOSIS — Y999 Unspecified external cause status: Secondary | ICD-10-CM | POA: Insufficient documentation

## 2019-10-14 DIAGNOSIS — M5416 Radiculopathy, lumbar region: Secondary | ICD-10-CM | POA: Diagnosis not present

## 2019-10-14 DIAGNOSIS — Z79899 Other long term (current) drug therapy: Secondary | ICD-10-CM | POA: Diagnosis not present

## 2019-10-14 DIAGNOSIS — Z7982 Long term (current) use of aspirin: Secondary | ICD-10-CM | POA: Insufficient documentation

## 2019-10-14 DIAGNOSIS — S3992XA Unspecified injury of lower back, initial encounter: Secondary | ICD-10-CM | POA: Diagnosis present

## 2019-10-14 DIAGNOSIS — M8448XA Pathological fracture, other site, initial encounter for fracture: Secondary | ICD-10-CM

## 2019-10-14 DIAGNOSIS — W19XXXA Unspecified fall, initial encounter: Secondary | ICD-10-CM | POA: Insufficient documentation

## 2019-10-14 IMAGING — CR DG SACRUM/COCCYX 2+V
3 series · 3 of 3 positions shown · non-contrast
Comparison: None.

CLINICAL DATA: Worsening tailbone pain.  No injury.

EXAM:
SACRUM AND COCCYX - 2+ VIEW

[coccyx ap]
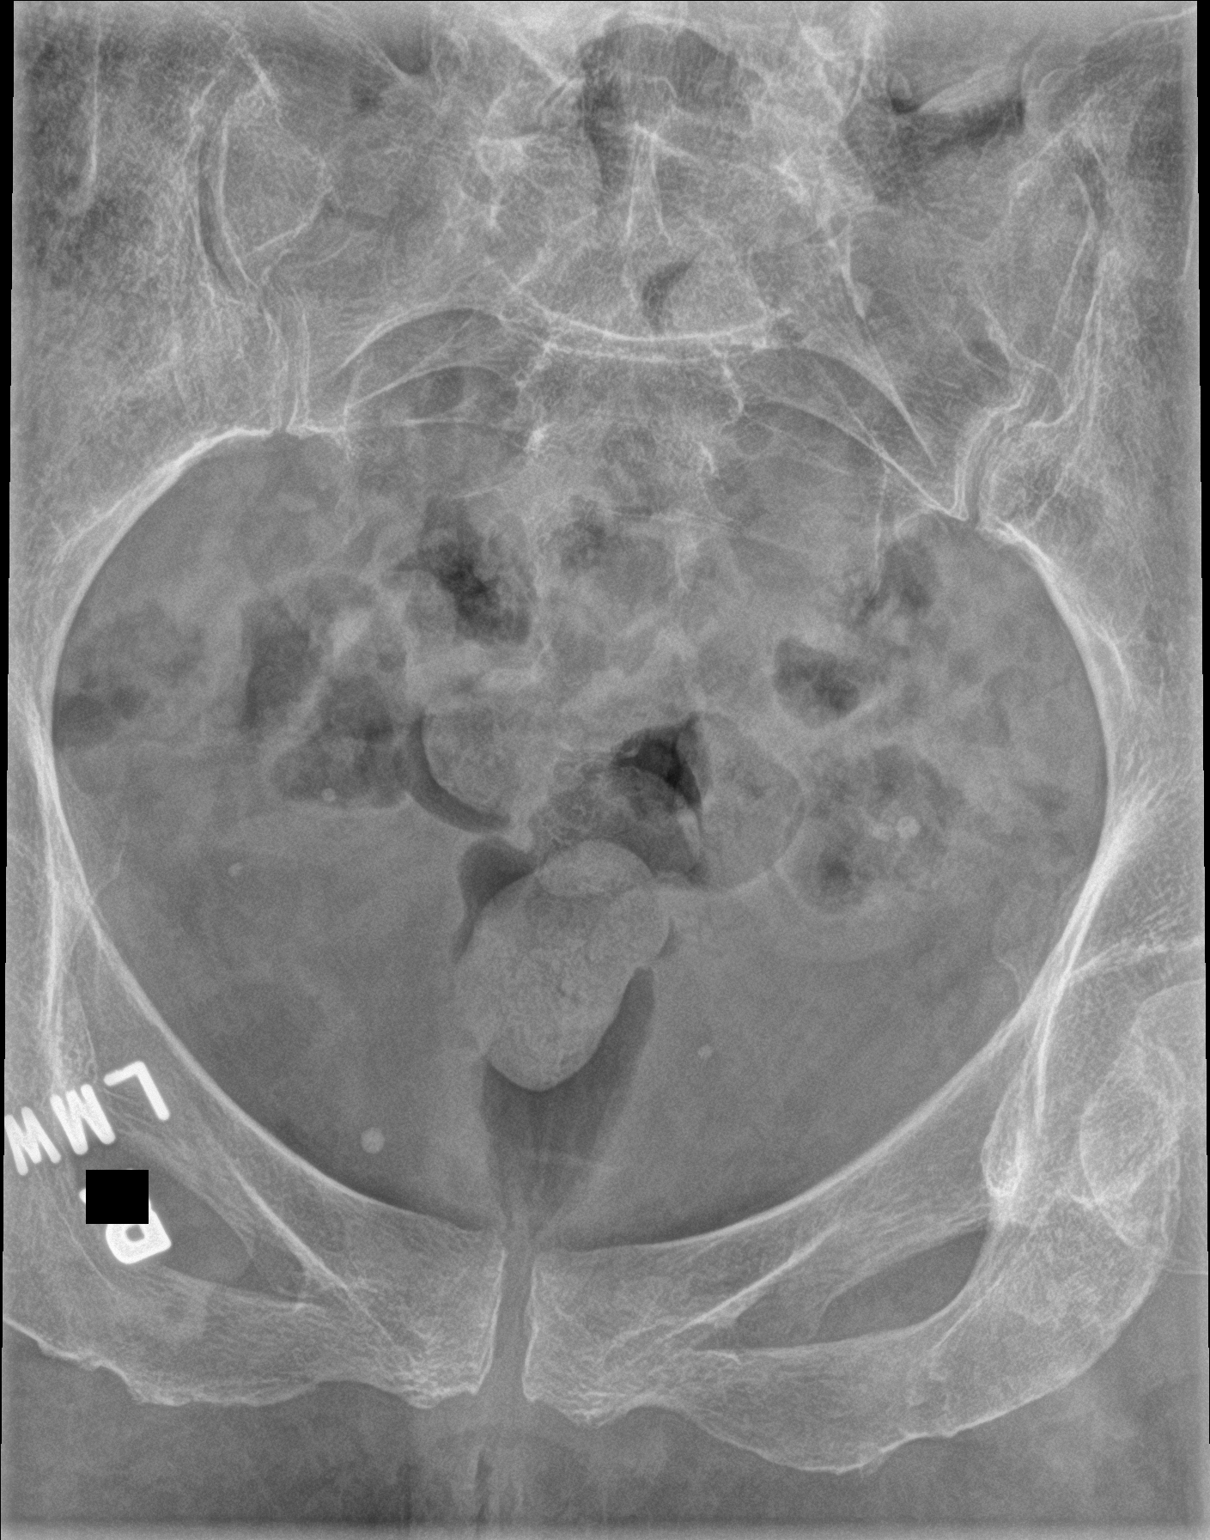

[sacrum ap]
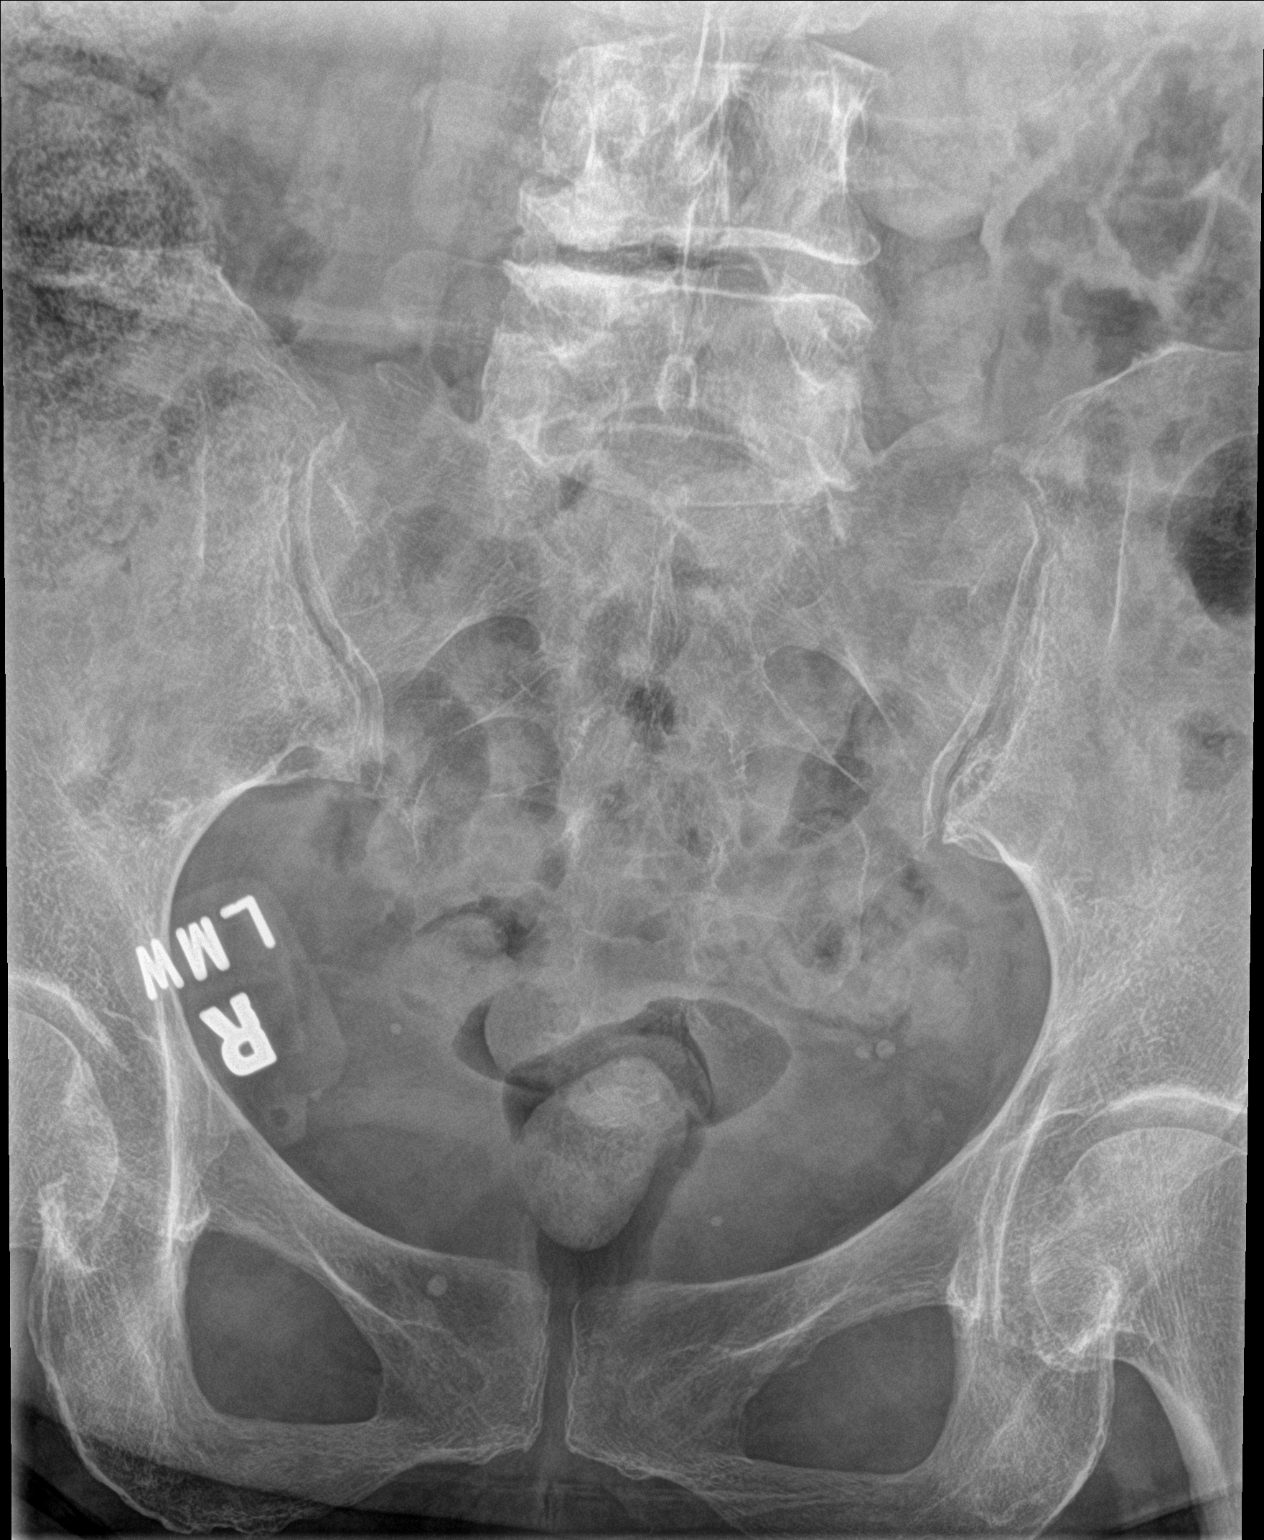

[sacrum lat]
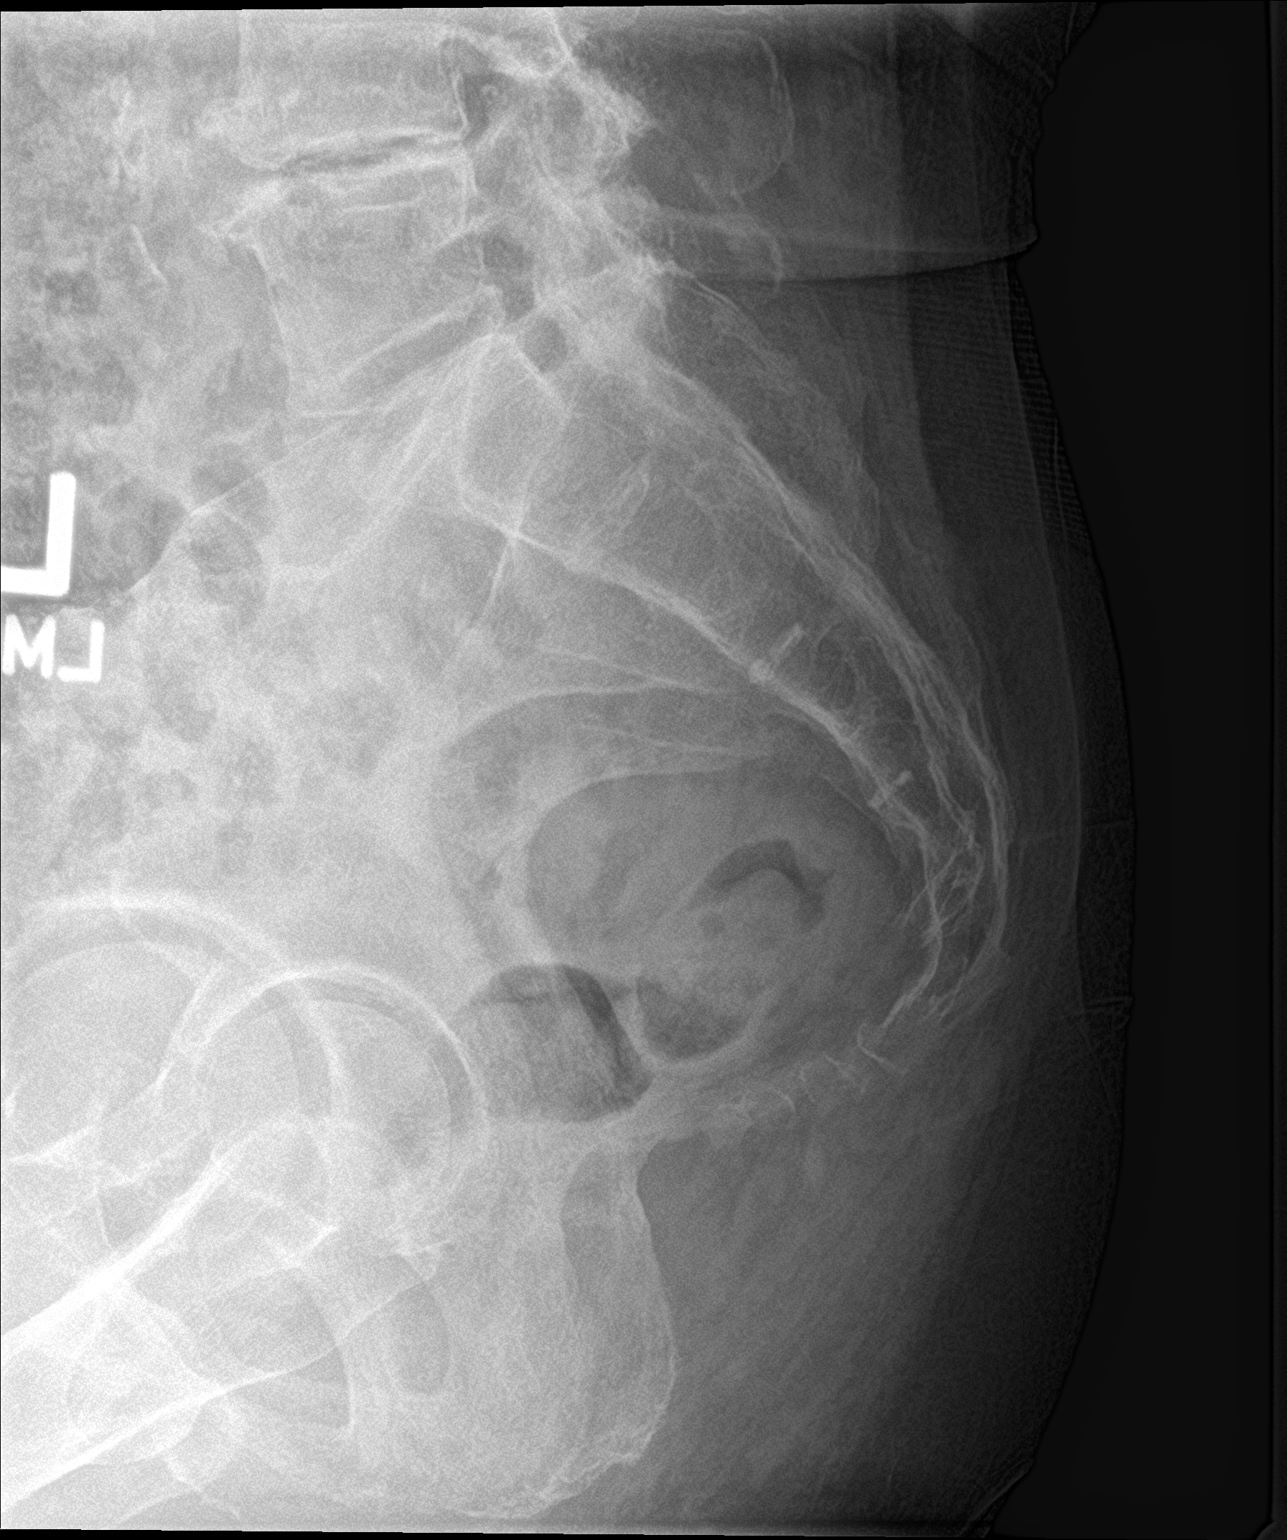

[3 of 3 positions shown; findings below may reference images not displayed]

FINDINGS: There is no evidence of fracture or other focal bone lesions.
IMPRESSION: Negative.

## 2019-10-14 IMAGING — CR DG LUMBAR SPINE 2-3V
3 series · 3 of 3 positions shown · non-contrast
Comparison: Lumbar spine x-rays dated [DATE].

CLINICAL DATA: Worsening lower back and tailbone pain radiating
into the legs. No injury.

EXAM:
LUMBAR SPINE - 2-3 VIEW

[l-spine ap]
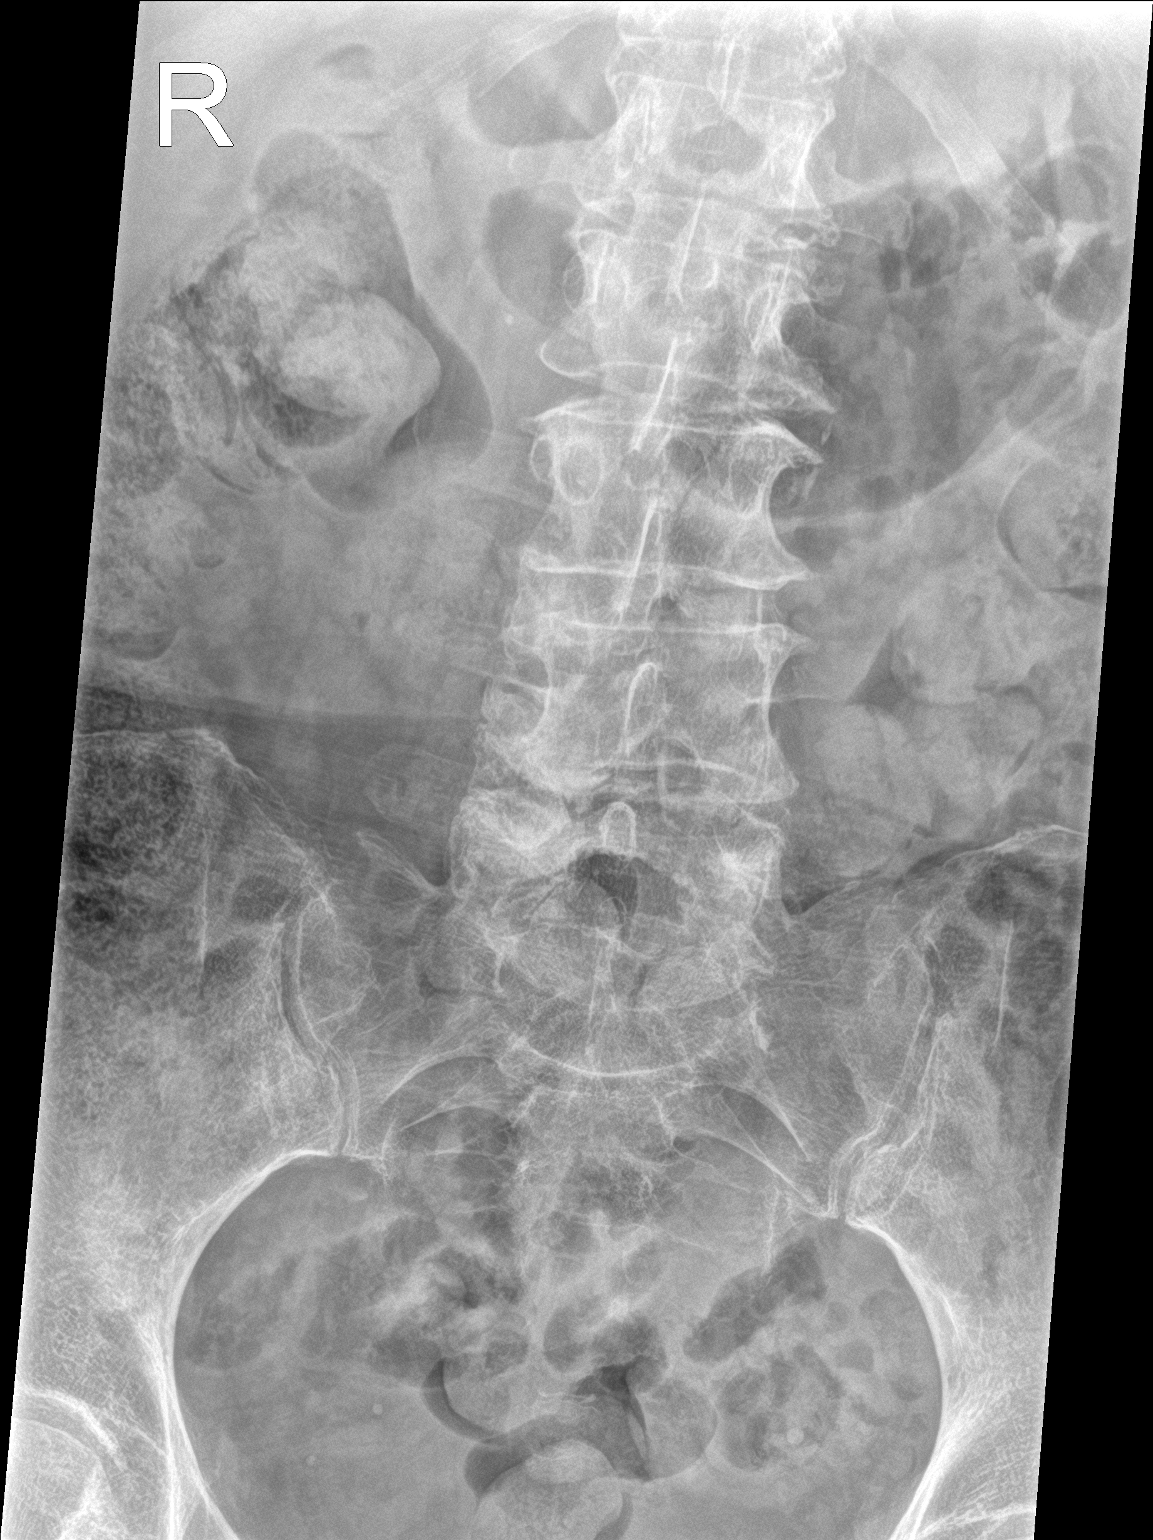

[l-spine lat]
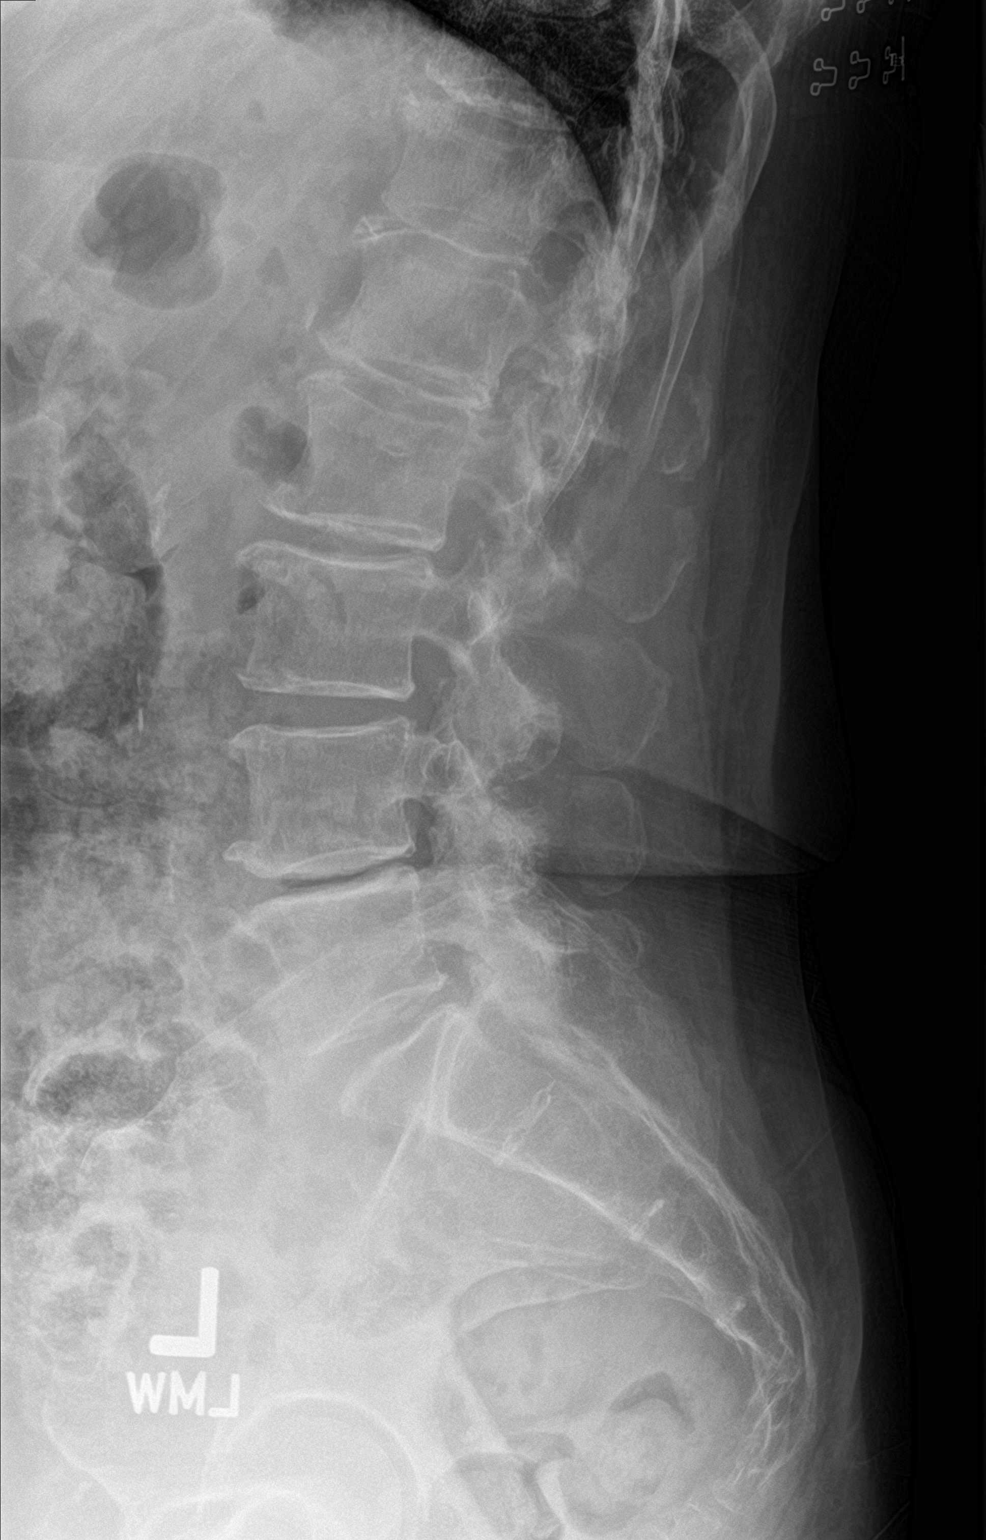

[l-spine spot]
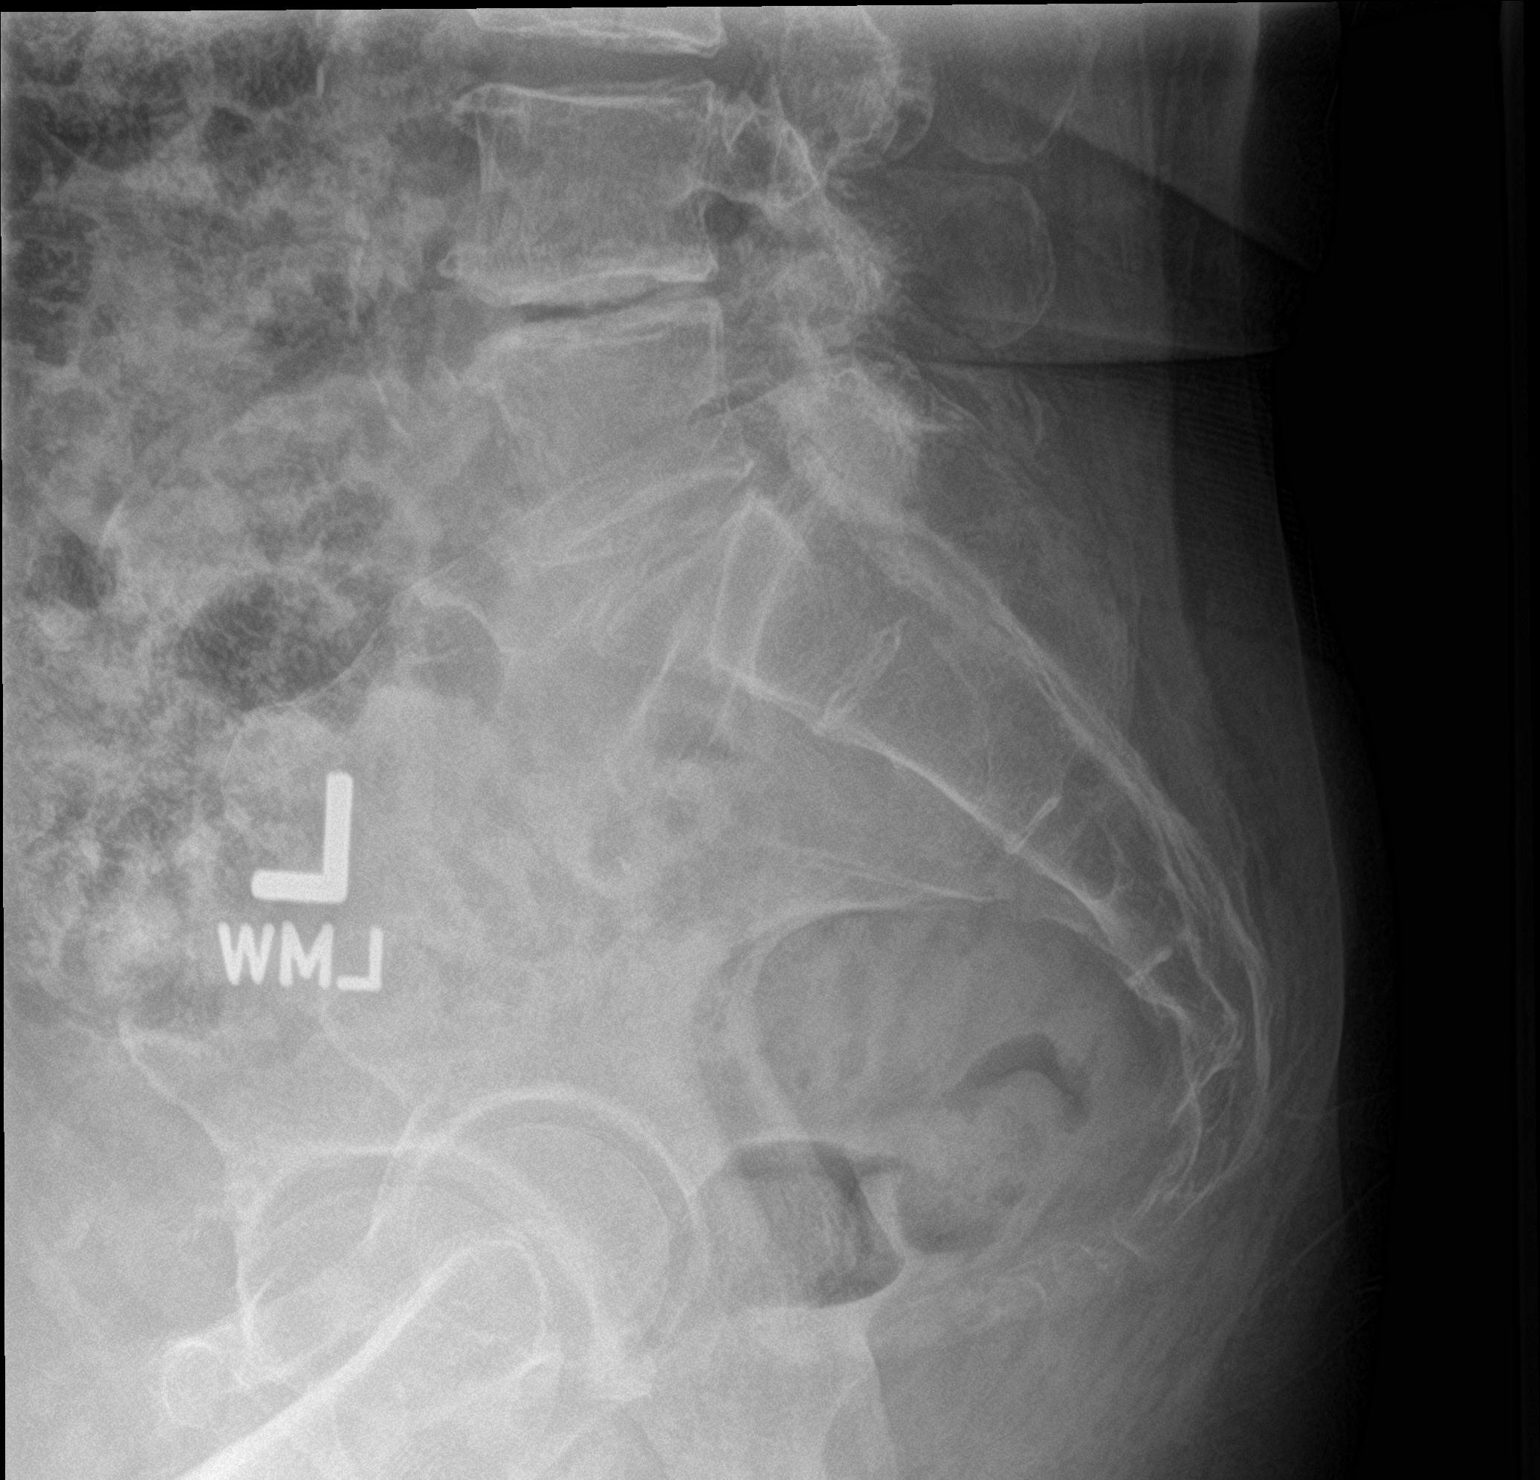

[3 of 3 positions shown; findings below may reference images not displayed]

FINDINGS: Five lumbar type vertebral bodies. Vertebral body heights are
preserved. Alignment is normal. Moderate disc height loss at L1-L2,
L2-L3, and L4-L5. Mild disc height loss at L3-L4 and L5-S1. Lower
lumbar facet arthropathy. Lucency overlying the right sacral ala.
IMPRESSION: 1. Lucency through the right sacral ala could reflect transitional
anatomy with partial lumbarization of the S1 vertebral body.
However, an insufficiency fracture is not excluded. Noncontrast
pelvic CT is recommended for further evaluation.
2. Mild-to-moderate multilevel lumbar spondylosis, similar to prior
study.

## 2019-10-14 IMAGING — CR DG PELVIS 1-2V
1 series · 1 of 1 positions shown · non-contrast
Comparison: None.

CLINICAL DATA: Worsening tailbone pain.  No injury.

EXAM:
PELVIS - 1-2 VIEW

[pelvis ap]
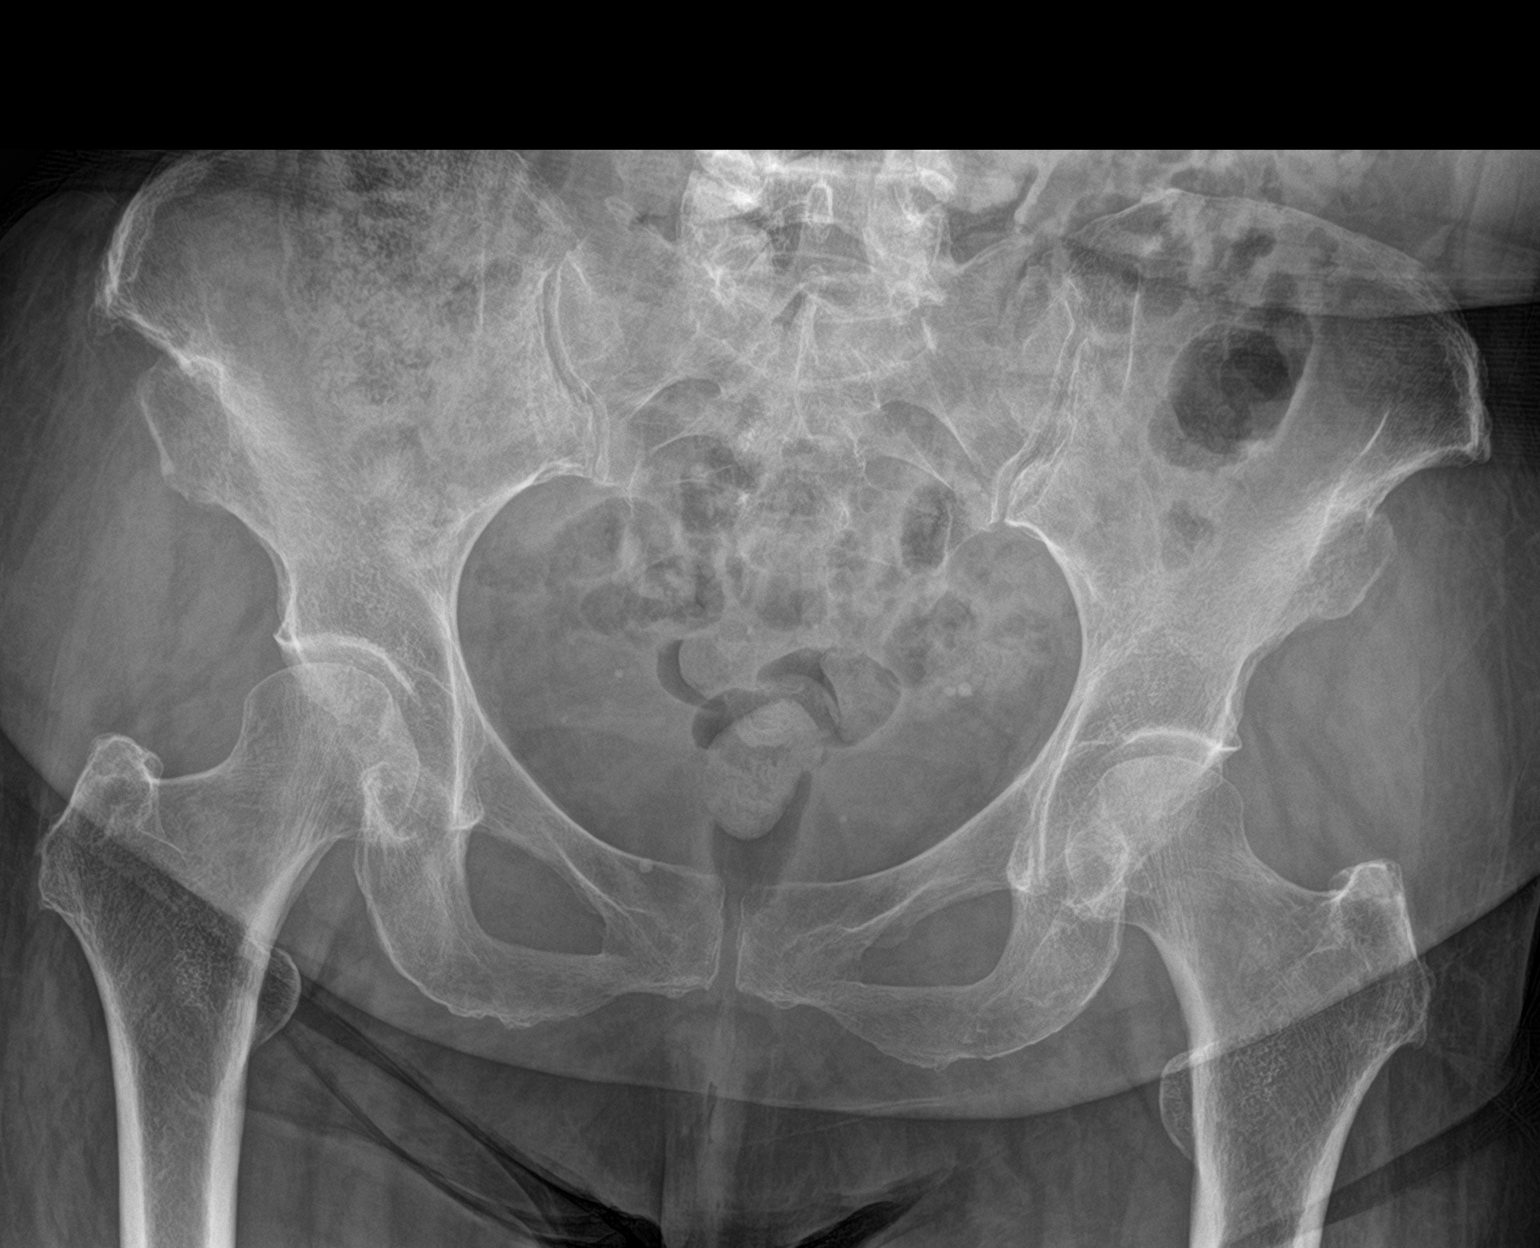

[1 of 1 positions shown; findings below may reference images not displayed]

FINDINGS: There is no evidence of pelvic fracture or diastasis. No pelvic bone
lesions are seen.
IMPRESSION: Negative.

## 2019-10-14 MED ORDER — OXYCODONE-ACETAMINOPHEN 7.5-325 MG PO TABS: 1.0000 | ORAL_TABLET | Freq: Four times a day (QID) | ORAL | 0 refills | Status: DC | PRN

## 2019-10-14 MED ORDER — OXYCODONE-ACETAMINOPHEN 5-325 MG PO TABS
2.0000 | ORAL_TABLET | Freq: Once | ORAL | Status: AC
  Administered 2019-10-14: 2 via ORAL
  Filled 2019-10-14: qty 2

## 2019-10-14 MED ORDER — KETOROLAC TROMETHAMINE 30 MG/ML IJ SOLN
30.0000 mg | Freq: Once | INTRAMUSCULAR | Status: AC
  Administered 2019-10-14: 30 mg via INTRAMUSCULAR
  Filled 2019-10-14: qty 1

## 2019-10-14 NOTE — ED Triage Notes (Signed)
FIRST NURSE NOTE- here for generalized weakness X 2-3 days.  Able to get from car to wheelchair by her self.

## 2019-10-14 NOTE — ED Provider Notes (Signed)
[facility] EMERGENCY DEPARTMENT Provider Note   CSN: [id number] Arrival date & time: 10/14/19  1652     History Chief Complaint  Patient presents with  . Back Pain  . Tailbone Pain    Teresa Lang is a 64 y.o. female presents to the emergency department for evaluation of bilateral leg pain and low back pain.  She has a history of chronic pain, fibromyalgia.  Currently under pain management in [place], receiving oxycodone 5 mg every 8 hours for chronic pain.  Several weeks ago she had a fall fracturing her right proximal humerus.  Currently undergoing care with orthopedist.  Her fall 3 weeks ago consisted of tripping over her dog for flared up her low back pain and over the last 3 weeks she has had increasing pain in her lower back, tailbone radiating into both lower legs into the knees.  She denies any numbness tingling or weakness although pain does radiate and is constant and worse with movement.  She states she is able to walk but has a lot of pain.  No loss of bowel or bladder symptoms.  No abdominal pain or fevers.  HPI     Past Medical History:  Diagnosis Date  . Allergy    seasonal  . Anxiety   . Arthritis   . Depression   . Hyperlipidemia   . Hypertension     Patient Active Problem List   Diagnosis Date Noted  . Alcohol abuse 03/21/2019  . 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 Angina at rest [facility]) 12/08/2018  . MDD (major depressive disorder), recurrent severe, without psychosis (HCC) 11/14/2018  . Anxiety 11/14/2018  . Tobacco use disorder 10/16/2018  . Chest pain at rest 04/15/2018  . Myofascial pain dysfunction syndrome 03/21/2018  . Cervico-occipital neuralgia of right side 03/21/2018  . Cervical post-laminectomy syndrome 02/18/2018  . Lumbar spondylosis 02/18/2018  . Encounter for long-term (current) use of high-risk medication 02/15/2018  . Chronic upper back pain (Primary  Area of Pain) (right) 01/02/2018  . Occipital neuralgia of right side (Secondary Area of Pain) 01/02/2018  . Chronic neck pain (Tertiary Area of Pain)(R) 01/02/2018  . Chronic bilateral low back pain without sciatica (Fourth Area of Pain) 01/02/2018  . Chronic generalized pain 01/02/2018  . Chronic pain syndrome 01/02/2018  . Long term current use of opiate analgesic 01/02/2018  . Pharmacologic therapy 01/02/2018  . Disorder of skeletal system 01/02/2018  . Problems influencing health status 01/02/2018  . PTSD (post-traumatic stress disorder) 12/14/2017  . Fibromyalgia 12/14/2017  . Hyperlipemia, mixed 12/14/2017  . Hypertension, essential 12/14/2017  . Major depressive disorder, recurrent episode, moderate degree (HCC) 12/14/2017  . Pain of cervical spine 12/14/2017  . Rheumatoid arthritis (HCC) 12/14/2017  . Rheumatoid arthritis involving multiple sites with positive rheumatoid factor (HCC) 12/14/2017    Past Surgical History:  Procedure Laterality Date  . ABDOMINAL HYSTERECTOMY  1983  . CHOLECYSTECTOMY  1983  . SPINE SURGERY  beginning 1994   laminectomies, fusions     OB History   No obstetric history on file.     Family History  Problem Relation Age of Onset  . Celiac disease Sister   . Breast cancer Neg Hx     Social History   Tobacco Use  . Smoking status: Former Smoker    Packs/day: 0.25    Years: 57.00    Pack years: 14.25    Types: Cigarettes    Start date: 1962  .  Smokeless tobacco: Never Used  . Tobacco comment: Also VAPES on a daily basis  Substance Use Topics  . Alcohol use: Never  . Drug use: Not Currently    Types: Marijuana    Home Medications Prior to Admission medications   Medication Sig Start Date End Date Taking? Authorizing Provider  albuterol (VENTOLIN HFA) 108 (90 Base) MCG/ACT inhaler Inhale 2 puffs into the lungs every 6 (six) hours as needed for wheezing or shortness of breath. 03/27/19   Clapacs, Madie Reno, MD  aspirin EC 81 MG  tablet Take 1 tablet (81 mg total) by mouth daily. 03/27/19   Clapacs, Madie Reno, MD  atorvastatin (LIPITOR) 40 MG tablet Take 1 tablet (40 mg total) by mouth every evening. 03/27/19   Clapacs, Madie Reno, MD  clonazePAM (KLONOPIN) 1 MG tablet Take 1 tablet (1 mg total) by mouth 2 (two) times daily as needed for up to 30 days for anxiety. 03/27/19 04/26/19  Clapacs, Madie Reno, MD  cyclobenzaprine (FLEXERIL) 5 MG tablet Take 1 tablet (5 mg total) by mouth 3 (three) times daily as needed for muscle spasms. 03/27/19   Clapacs, Madie Reno, MD  FLUoxetine (PROZAC) 20 MG capsule TAKE 3 CAPSULES BY MOUTH DAILY 04/18/19   Clapacs, Madie Reno, MD  fluticasone furoate-vilanterol (BREO ELLIPTA) 100-25 MCG/INH AEPB Inhale 1 puff into the lungs daily. 03/27/19   Clapacs, Madie Reno, MD  folic acid (FOLVITE) 1 MG tablet Take 1 tablet (1 mg total) by mouth daily. 03/27/19   Clapacs, Madie Reno, MD  isosorbide mononitrate (IMDUR) 30 MG 24 hr tablet Take 1 tablet (30 mg total) by mouth 2 (two) times daily. 03/27/19   Clapacs, Madie Reno, MD  lisinopril (ZESTRIL) 20 MG tablet Take 1 tablet (20 mg total) by mouth daily. 03/27/19   Clapacs, Madie Reno, MD  lithium carbonate 300 MG capsule Take 1 capsule (300 mg total) by mouth at bedtime. 03/27/19   Clapacs, Madie Reno, MD  methotrexate (RHEUMATREX) 2.5 MG tablet Take 15 mg by mouth once a week.  10/16/18   [provider]  metoprolol tartrate (LOPRESSOR) 25 MG tablet Take 1 tablet (25 mg total) by mouth 2 (two) times daily. 03/27/19   Clapacs, Madie Reno, MD  olopatadine (PATANOL) 0.1 % ophthalmic solution Place 1 drop into both eyes 2 (two) times daily. 03/27/19   Clapacs, Madie Reno, MD  oxyCODONE (OXY IR/ROXICODONE) 5 MG immediate release tablet Take 1 tablet (5 mg total) by mouth 3 (three) times daily. 08/19/19   Fisher, Linden Dolin, PA-C  oxyCODONE-acetaminophen (PERCOCET) 7.5-325 MG tablet Take 1 tablet by mouth every 6 (six) hours as needed for severe pain. 10/14/19 10/13/20  Duanne Guess, PA-C  pantoprazole  (PROTONIX) 40 MG tablet Take 1 tablet (40 mg total) by mouth daily. 03/27/19   Clapacs, Madie Reno, MD  traZODone (DESYREL) 100 MG tablet Take 1 tablet (100 mg total) by mouth at bedtime. 03/27/19   Clapacs, Madie Reno, MD  umeclidinium bromide (INCRUSE ELLIPTA) 62.5 MCG/INH AEPB Inhale 1 puff into the lungs daily. 03/27/19   Clapacs, Madie Reno, MD    Allergies    Prednisone  Review of Systems   Review of Systems  Constitutional: Negative for chills and fever.  Respiratory: Negative for shortness of breath.   Cardiovascular: Negative for chest pain.  Gastrointestinal: Negative for nausea and vomiting.  Musculoskeletal: Positive for arthralgias and back pain. Negative for joint swelling, myalgias and neck pain.  Skin: Negative for rash and wound.  Neurological: Negative  for dizziness, weakness, numbness and headaches.    Physical Exam Updated Vital Signs BP (!) 139/93 (BP Location: Left Arm)   Pulse 80   Temp 98.2 F (36.8 C) (Oral)   Resp 16   Ht 5\' 5"  (1.651 m)   Wt 83.5 kg   SpO2 97%   BMI 30.62 kg/m   Physical Exam Constitutional:      Appearance: She is well-developed.  HENT:     Head: Normocephalic and atraumatic.  Eyes:     Conjunctiva/sclera: Conjunctivae normal.  Cardiovascular:     Rate and Rhythm: Normal rate.  Pulmonary:     Effort: Pulmonary effort is normal. No respiratory distress.  Musculoskeletal:     Cervical back: Normal range of motion.     Comments: Tender along the lumbosacral region of the spine.  Mild bilateral SI joint tenderness.  No paravertebral muscle tenderness but tender along the sacral region.  She has negative straight leg raise bilaterally.  She is able to actively straight leg raise both legs.  Good ankle plantar flexion dorsiflexion.  Sensation intact distally.  No saddle anesthesia.  Skin:    General: Skin is warm.     Findings: No rash.  Neurological:     Mental Status: She is alert and oriented to person, place, and time.  Psychiatric:         Behavior: Behavior normal.        Thought Content: Thought content normal.     ED Results / Procedures / Treatments   Labs (all labs ordered are listed, but only abnormal results are displayed) Labs Reviewed - No data to display  EKG None  Radiology DG Lumbar Spine 2-3 Views  Result Date: 10/14/2019 CLINICAL DATA:  Worsening lower back and tailbone pain radiating into the legs. No injury. EXAM: LUMBAR SPINE - 2-3 VIEW COMPARISON:  Lumbar spine x-rays dated October 06, 2019. FINDINGS: Five lumbar type vertebral bodies. Vertebral body heights are preserved. Alignment is normal. Moderate disc height loss at L1-L2, L2-L3, and L4-L5. Mild disc height loss at L3-L4 and L5-S1. Lower lumbar facet arthropathy. Lucency overlying the right sacral ala. IMPRESSION: 1. Lucency through the right sacral ala could reflect transitional anatomy with partial lumbarization of the S1 vertebral body. However, an insufficiency fracture is not excluded. Noncontrast pelvic CT is recommended for further evaluation. 2. Mild-to-moderate multilevel lumbar spondylosis, similar to prior study. Electronically Signed   By: October 08, 2019 M.D.   On: 10/14/2019 19:30   DG Pelvis 1-2 Views  Result Date: 10/14/2019 CLINICAL DATA:  Worsening tailbone pain.  No injury. EXAM: PELVIS - 1-2 VIEW COMPARISON:  None. FINDINGS: There is no evidence of pelvic fracture or diastasis. No pelvic bone lesions are seen. IMPRESSION: Negative. Electronically Signed   By: 12/12/2019 M.D.   On: 10/14/2019 19:25   DG Sacrum/Coccyx  Result Date: 10/14/2019 CLINICAL DATA:  Worsening tailbone pain.  No injury. EXAM: SACRUM AND COCCYX - 2+ VIEW COMPARISON:  None. FINDINGS: There is no evidence of fracture or other focal bone lesions. IMPRESSION: Negative. Electronically Signed   By: 12/12/2019 M.D.   On: 10/14/2019 19:24    Procedures Procedures (including critical care time)  Medications Ordered in ED Medications    oxyCODONE-acetaminophen (PERCOCET/ROXICET) 5-325 MG per tablet 2 tablet (2 tablets Oral Given 10/14/19 1856)  ketorolac (TORADOL) 30 MG/ML injection 30 mg (30 mg Intramuscular Given 10/14/19 1854)    ED Course  I have reviewed the triage vital signs and the nursing  notes.  Pertinent labs & imaging results that were available during my care of the patient were reviewed by me and considered in my medical decision making (see chart for details).    MDM Rules/Calculators/A&P                      64 year old female with fall several weeks ago.  Chief complaint consistent with bilateral lumbar radiculopathy, having pain radiating down the both lower extremities.  She saw improvement with Toradol and oxycodone in the emergency department, able to ambulate with only mild pain.  Regular walker distributed in the emergency department to help assist with ambulation.  Patient with no weakness or neurological deficits.  Her x-ray of the lumbar spine showed no definite acute fracture but some concern for possible right-sided sacral alla insufficiency fracture.  Offered CT imaging in the ED to confirm diagnosis but patient requesting to follow-up with orthopedics, not interested in additional imaging today.  I recommend patient continue to take it easy, avoid excessive activity and ambulation.  She will follow-up with Ortho to discuss more advanced imaging.  She understands signs and symptoms to return to the ED for.  She will take oxycodone 7.5 mg every 6 hours as needed, she is educated on constipation and medications to take over-the-counter to help prevent constipation.  She will not drive while taking these medications.  She will hold off on taking her regular 5 mg oxycodone tablets every 8 hours     Final Clinical Impression(s) / ED Diagnoses Final diagnoses:  Bilateral lumbar radiculopathy  Sacral pain  Sacral insufficiency fracture, initial encounter    Rx / DC Orders ED Discharge Orders          Ordered    oxyCODONE-acetaminophen (PERCOCET) 7.5-325 MG tablet  Every 6 hours PRN     10/14/19 2013           Ronnette Juniper 10/14/19 2019    Darci Current, MD 10/16/19 [phone]

## 2019-10-14 NOTE — ED Notes (Signed)
See triage note  Presents with pain to lower back/tailbone  States pain radiates into both legs  States pain started a couple of days ago  States pain is getting worse

## 2019-10-14 NOTE — ED Triage Notes (Addendum)
Pt in via POV, complaints of ongoing pain to back and tail bone. States, "I cant stand up, I cant lay down."  Denies any recent fall or injury.  Vitals WDL, NAD noted at this time.

## 2019-10-14 NOTE — ED Notes (Signed)
Pt confirms ride home at this time.  

## 2019-10-14 NOTE — Discharge Instructions (Signed)
Please take medication as prescribed.  Please use over-the-counter medications such as MiraLAX and/or Dulcolax daily to prevent constipation.  Your x-rays today are concerning for possible insufficiency fracture.  Recommend ambulating with a walker, resting and following up with Dr. Rosita Kea.  Return to the ER for any worsening symptoms or urgent changes in your health such as weakness, loss of bowel or bladder symptoms.

## 2019-10-17 DIAGNOSIS — M533 Sacrococcygeal disorders, not elsewhere classified: Secondary | ICD-10-CM | POA: Insufficient documentation

## 2019-10-30 ENCOUNTER — Encounter: Payer: Self-pay | Admitting: Emergency Medicine

## 2019-10-30 ENCOUNTER — Emergency Department
Admission: EM | Admit: 2019-10-30 | Discharge: 2019-10-30 | Disposition: A | Discharge status: Home / Self Care | Payer: Medicare HMO | Attending: Emergency Medicine | Admitting: Emergency Medicine

## 2019-10-30 ENCOUNTER — Emergency Department: Payer: Medicare HMO

## 2019-10-30 ENCOUNTER — Other Ambulatory Visit: Payer: Self-pay

## 2019-10-30 DIAGNOSIS — Z79899 Other long term (current) drug therapy: Secondary | ICD-10-CM | POA: Insufficient documentation

## 2019-10-30 DIAGNOSIS — Z7982 Long term (current) use of aspirin: Secondary | ICD-10-CM | POA: Diagnosis not present

## 2019-10-30 DIAGNOSIS — I10 Essential (primary) hypertension: Secondary | ICD-10-CM | POA: Insufficient documentation

## 2019-10-30 DIAGNOSIS — M533 Sacrococcygeal disorders, not elsewhere classified: Secondary | ICD-10-CM | POA: Diagnosis present

## 2019-10-30 DIAGNOSIS — F1210 Cannabis abuse, uncomplicated: Secondary | ICD-10-CM | POA: Diagnosis not present

## 2019-10-30 DIAGNOSIS — Z87891 Personal history of nicotine dependence: Secondary | ICD-10-CM | POA: Insufficient documentation

## 2019-10-30 IMAGING — CT CT PELVIS W/O CM
2 of 4 series · 16 of 46 positions shown, 18 images · non-contrast
Comparison: None.

CLINICAL DATA: Pelvic fracture.

EXAM:
CT PELVIS WITHOUT CONTRAST
TECHNIQUE: Multidetector CT imaging of the pelvis was performed following the
standard protocol without intravenous contrast.

[Series 3: axial st · axial · 0.83mm/px · z∈[-3,+213]mm · 13 of 124 slices shown, 15 images]
[im 8/124  soft-tissue]
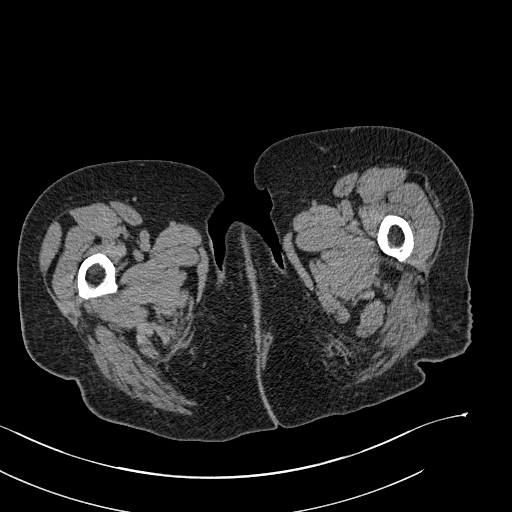
[im 8/124  bone]
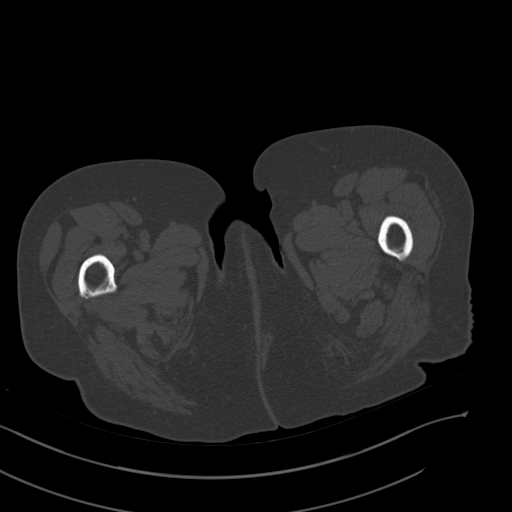
[im 16/124  soft-tissue]
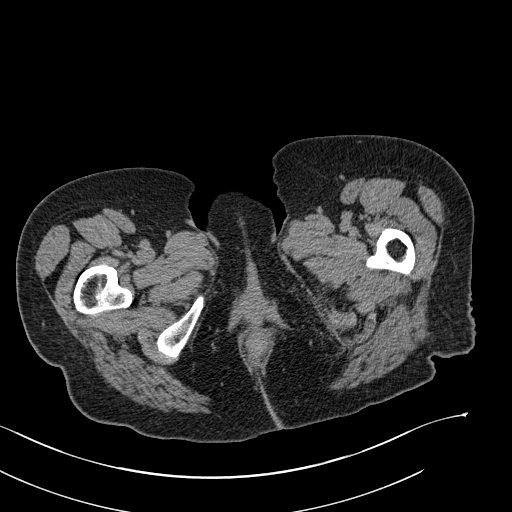
[im 24/124  soft-tissue]
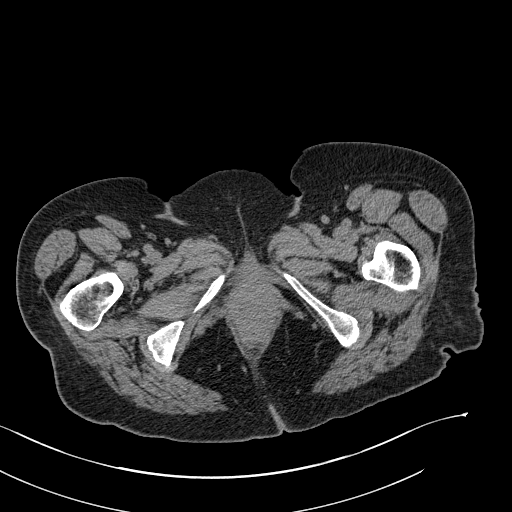
[im 36/124  soft-tissue]
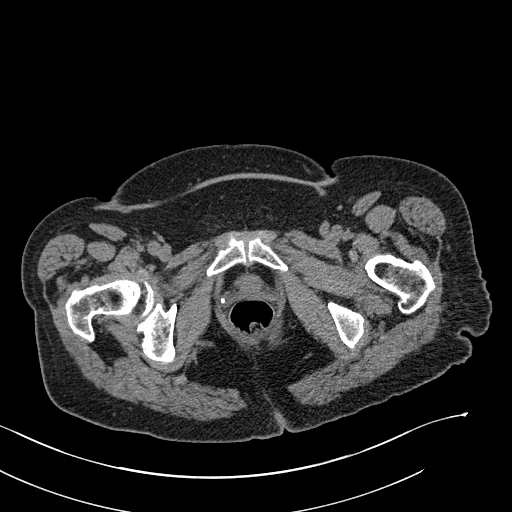
[im 44/124  soft-tissue]
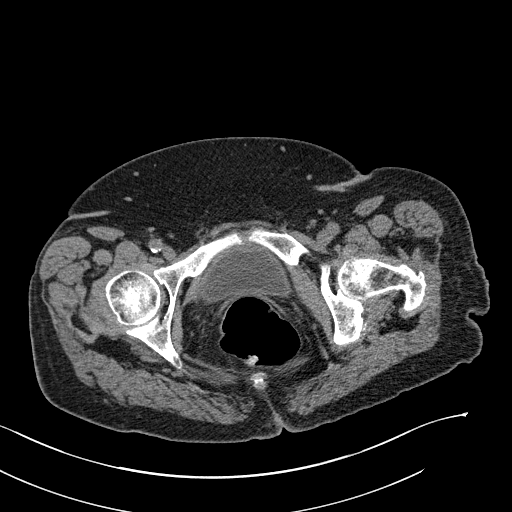
[im 52/124  soft-tissue]
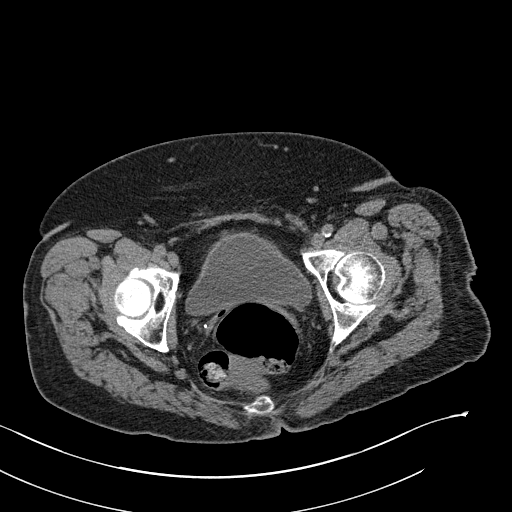
[im 64/124  soft-tissue]
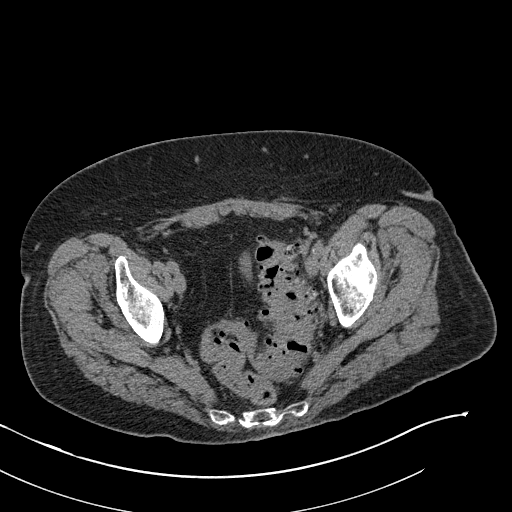
[im 72/124  soft-tissue]
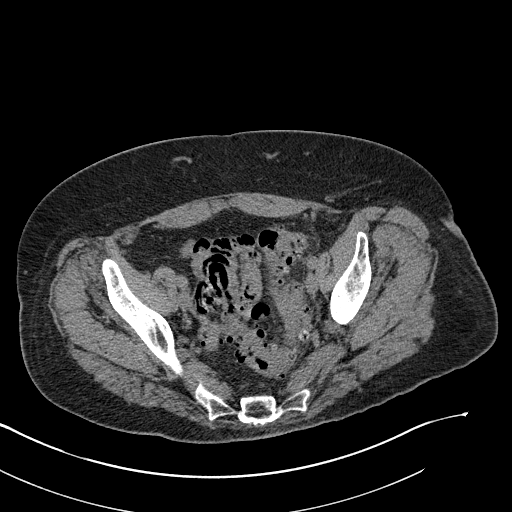
[im 80/124  soft-tissue]
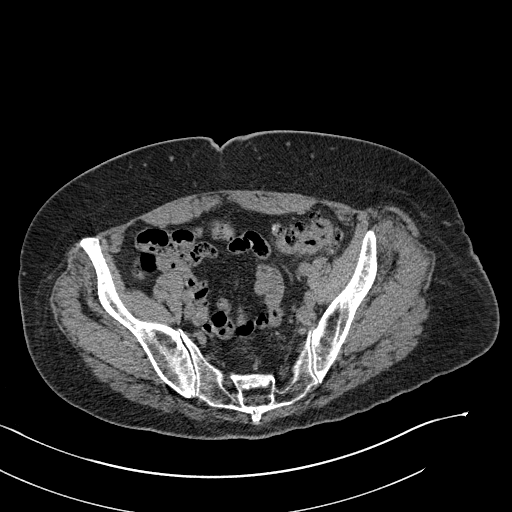
[im 80/124  bone]
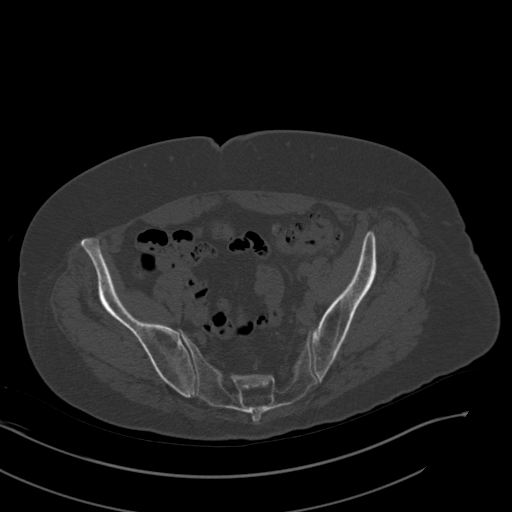
[im 88/124  soft-tissue]
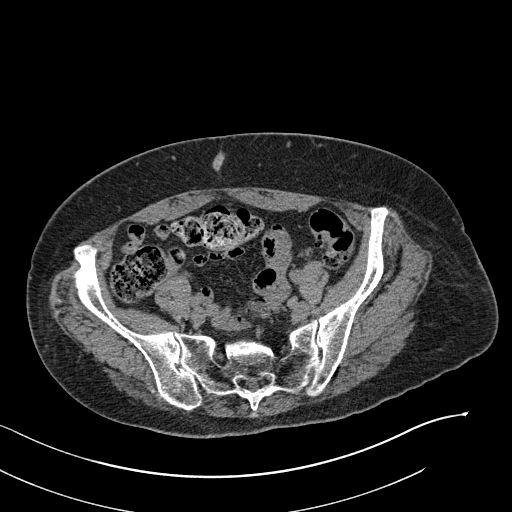
[im 100/124  soft-tissue]
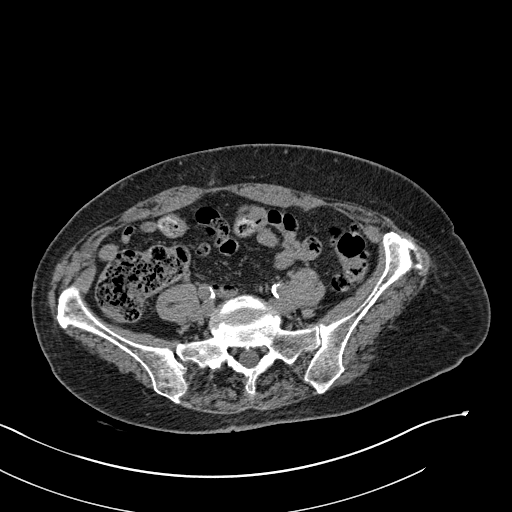
[im 108/124  soft-tissue]
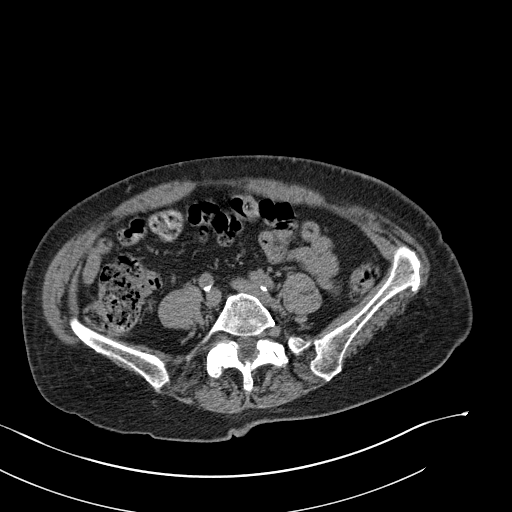
[im 116/124  soft-tissue]
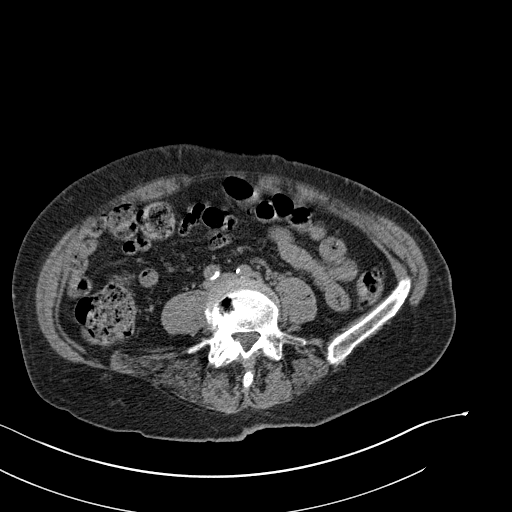

[Series 8: coronal st · coronal · 0.49mm/px · 3 of 130 slices shown]
[im 33/130  soft-tissue]
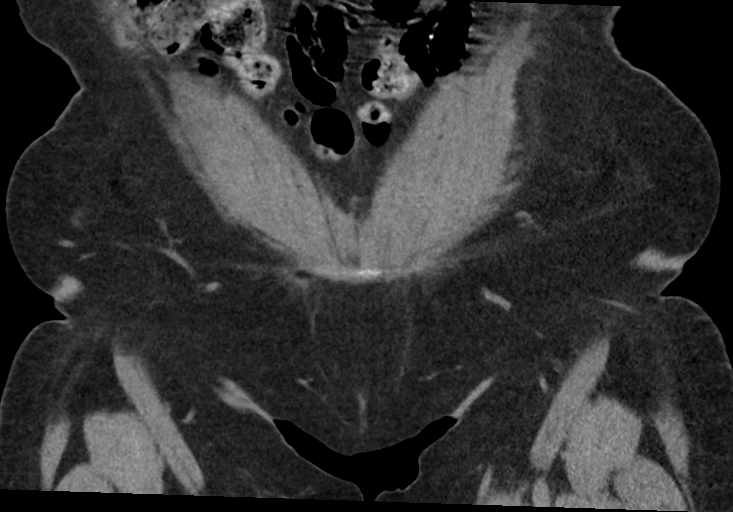
[im 65/130  soft-tissue]
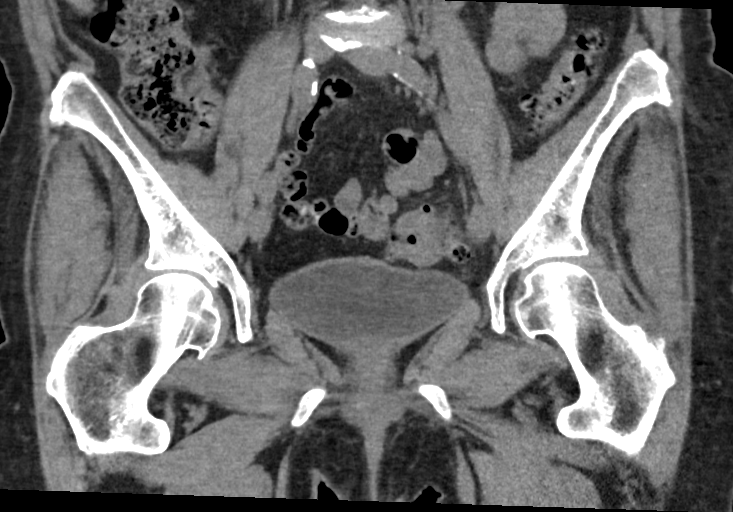
[im 97/130  soft-tissue]
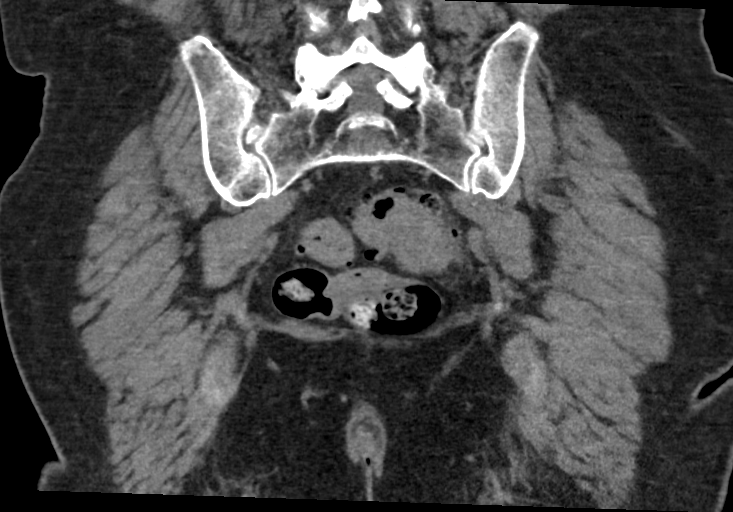

[16 of 46 positions shown; findings below may reference images not displayed]

FINDINGS: Urinary Tract: The urinary bladder appears normal for the degree of
distention.

Bowel: No small bowel or colonic dilatation within the visualized
pelvis. Advanced diverticular disease noted in the sigmoid colon.

Vascular/Lymphatic: Atherosclerotic calcification noted in the
common and internal iliac arteries bilaterally. No pelvic sidewall
lymphadenopathy.

Reproductive: The uterus is surgically absent. There is no adnexal
mass.

Other:  No intraperitoneal free fluid.

Musculoskeletal: No evidence of an acute fracture. Specifically,
there is no fracture of the right sacral ala. Degenerative spurring
noted in the femoral heads bilaterally.
IMPRESSION: 1. No acute bony abnormality. Specifically, no fracture of the right
sacrum as questioned on previous lumbar spine x-ray.
2. Sigmoid diverticulosis.

## 2019-10-30 MED ORDER — ONDANSETRON 8 MG PO TBDP
8.0000 mg | ORAL_TABLET | Freq: Once | ORAL | Status: AC
  Administered 2019-10-30: 17:00:00 8 mg via ORAL
  Filled 2019-10-30: qty 1

## 2019-10-30 MED ORDER — MORPHINE SULFATE (PF) 4 MG/ML IV SOLN
4.0000 mg | Freq: Once | INTRAVENOUS | Status: AC
  Administered 2019-10-30: 4 mg via INTRAMUSCULAR
  Filled 2019-10-30: qty 1

## 2019-10-30 MED ORDER — KETOROLAC TROMETHAMINE 30 MG/ML IJ SOLN
30.0000 mg | Freq: Once | INTRAMUSCULAR | Status: AC
  Administered 2019-10-30: 17:00:00 30 mg via INTRAMUSCULAR
  Filled 2019-10-30: qty 1

## 2019-10-30 NOTE — ED Notes (Signed)
Patient states she has history of copd and is unsure what her normal oxygen saturation is.  Patient is in no obvious distress and her respirations are even and not labored at this time.

## 2019-10-30 NOTE — ED Notes (Signed)
See triage note  Presents with tail bone pain for several days  States she did not have a fall and was seen by her PCP this week  Stats she given  Pain meds  But states pain is worse  And moving into both legs

## 2019-10-30 NOTE — ED Triage Notes (Signed)
Patient is complaining of "tailbone" pain.  Patient denies injury.  Reports pain with sitting.  Patient is unsure if she may have a sore/abscess to area.  Patient was complaining of similar pain when she came to the ED Jan. 5th.

## 2019-10-30 NOTE — ED Provider Notes (Signed)
[facility] Emergency Department Provider Note  ____________________________________________  Time seen: Approximately 3:05 PM  I have reviewed the triage vital signs and the nursing notes.   HISTORY  Chief Complaint Tailbone Pain    HPI Teresa Lang is a 64 y.o. female who presents the emergency department complaining of ongoing sacral pain.  Patient had been evaluated by this department 2 weeks prior, subsequently seeing pain management for ongoing sacral pain.  Patient had findings concerning for sacral alla insufficiency fracture on her previous visit.  At that time, patient did not wish to pursue advanced imaging such as a CT scan.  Her pain had been managed in the emergency department and she requested discharge.  As patient has had ongoing pain, pain management, primary care and orthopedics recommended imaging.  Patient presents the emergency department for further evaluation of ongoing sacral pain.  Patient denies any urinary or GI complaints.  No recent trauma to the area.  No loss of sensation reported.  No bowel or bladder dysfunction.  Patient advised triage that she may have a "sore" to the area.  Patient denies any skin breakdown.  No erythema.  Patient denies any rectal or perirectal pain.  No rectal bleeding.  Patient does have medical history as described below with major depressive disorder, myofascial pain syndrome, hypertension, rheumatoid arthritis, chronic pain syndrome.         Past Medical History:  Diagnosis Date  . Allergy    seasonal  . Anxiety   . Arthritis   . Depression   . Hyperlipidemia   . Hypertension     Patient Active Problem List   Diagnosis Date Noted  . Alcohol abuse 03/21/2019  . 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 Angina at rest [facility]) 12/08/2018  . MDD (major depressive disorder), recurrent severe, without psychosis (HCC) 11/14/2018  . Anxiety  11/14/2018  . Tobacco use disorder 10/16/2018  . Chest pain at rest 04/15/2018  . Myofascial pain dysfunction syndrome 03/21/2018  . Cervico-occipital neuralgia of right side 03/21/2018  . Cervical post-laminectomy syndrome 02/18/2018  . Lumbar spondylosis 02/18/2018  . Encounter for long-term (current) use of high-risk medication 02/15/2018  . Chronic upper back pain (Primary Area of Pain) (right) 01/02/2018  . Occipital neuralgia of right side (Secondary Area of Pain) 01/02/2018  . Chronic neck pain (Tertiary Area of Pain)(R) 01/02/2018  . Chronic bilateral low back pain without sciatica (Fourth Area of Pain) 01/02/2018  . Chronic generalized pain 01/02/2018  . Chronic pain syndrome 01/02/2018  . Long term current use of opiate analgesic 01/02/2018  . Pharmacologic therapy 01/02/2018  . Disorder of skeletal system 01/02/2018  . Problems influencing health status 01/02/2018  . PTSD (post-traumatic stress disorder) 12/14/2017  . Fibromyalgia 12/14/2017  . Hyperlipemia, mixed 12/14/2017  . Hypertension, essential 12/14/2017  . Major depressive disorder, recurrent episode, moderate degree (HCC) 12/14/2017  . Pain of cervical spine 12/14/2017  . Rheumatoid arthritis (HCC) 12/14/2017  . Rheumatoid arthritis involving multiple sites with positive rheumatoid factor (HCC) 12/14/2017    Past Surgical History:  Procedure Laterality Date  . ABDOMINAL HYSTERECTOMY  1983  . CHOLECYSTECTOMY  1983  . SPINE SURGERY  beginning 1994   laminectomies, fusions    Prior to Admission medications   Medication Sig Start Date End Date Taking? Authorizing Provider  albuterol (VENTOLIN HFA) 108 (90 Base) MCG/ACT inhaler Inhale 2 puffs into the lungs every 6 (six) hours as needed for wheezing or shortness of  breath. 03/27/19   Clapacs, Jackquline Denmark, MD  aspirin EC 81 MG tablet Take 1 tablet (81 mg total) by mouth daily. 03/27/19   Clapacs, Jackquline Denmark, MD  atorvastatin (LIPITOR) 40 MG tablet Take 1 tablet (40 mg  total) by mouth every evening. 03/27/19   Clapacs, Jackquline Denmark, MD  clonazePAM (KLONOPIN) 1 MG tablet Take 1 tablet (1 mg total) by mouth 2 (two) times daily as needed for up to 30 days for anxiety. 03/27/19 04/26/19  Clapacs, Jackquline Denmark, MD  cyclobenzaprine (FLEXERIL) 5 MG tablet Take 1 tablet (5 mg total) by mouth 3 (three) times daily as needed for muscle spasms. 03/27/19   Clapacs, Jackquline Denmark, MD  FLUoxetine (PROZAC) 20 MG capsule TAKE 3 CAPSULES BY MOUTH DAILY 04/18/19   Clapacs, Jackquline Denmark, MD  fluticasone furoate-vilanterol (BREO ELLIPTA) 100-25 MCG/INH AEPB Inhale 1 puff into the lungs daily. 03/27/19   Clapacs, Jackquline Denmark, MD  folic acid (FOLVITE) 1 MG tablet Take 1 tablet (1 mg total) by mouth daily. 03/27/19   Clapacs, Jackquline Denmark, MD  isosorbide mononitrate (IMDUR) 30 MG 24 hr tablet Take 1 tablet (30 mg total) by mouth 2 (two) times daily. 03/27/19   Clapacs, Jackquline Denmark, MD  lisinopril (ZESTRIL) 20 MG tablet Take 1 tablet (20 mg total) by mouth daily. 03/27/19   Clapacs, Jackquline Denmark, MD  lithium carbonate 300 MG capsule Take 1 capsule (300 mg total) by mouth at bedtime. 03/27/19   Clapacs, Jackquline Denmark, MD  methotrexate (RHEUMATREX) 2.5 MG tablet Take 15 mg by mouth once a week.  10/16/18   [provider]  metoprolol tartrate (LOPRESSOR) 25 MG tablet Take 1 tablet (25 mg total) by mouth 2 (two) times daily. 03/27/19   Clapacs, Jackquline Denmark, MD  olopatadine (PATANOL) 0.1 % ophthalmic solution Place 1 drop into both eyes 2 (two) times daily. 03/27/19   Clapacs, Jackquline Denmark, MD  oxyCODONE-acetaminophen (PERCOCET) 7.5-325 MG tablet Take 1 tablet by mouth every 6 (six) hours as needed for severe pain. 10/14/19 10/13/20  Evon Slack, PA-C  pantoprazole (PROTONIX) 40 MG tablet Take 1 tablet (40 mg total) by mouth daily. 03/27/19   Clapacs, Jackquline Denmark, MD  traZODone (DESYREL) 100 MG tablet Take 1 tablet (100 mg total) by mouth at bedtime. 03/27/19   Clapacs, Jackquline Denmark, MD  umeclidinium bromide (INCRUSE ELLIPTA) 62.5 MCG/INH AEPB Inhale 1 puff into the lungs  daily. 03/27/19   Clapacs, Jackquline Denmark, MD    Allergies Prednisone  Family History  Problem Relation Age of Onset  . Celiac disease Sister   . Breast cancer Neg Hx     Social History Social History   Tobacco Use  . Smoking status: Former Smoker    Packs/day: 0.25    Years: 57.00    Pack years: 14.25    Types: Cigarettes    Start date: 1962  . Smokeless tobacco: Never Used  . Tobacco comment: Also VAPES on a daily basis  Substance Use Topics  . Alcohol use: Never  . Drug use: Not Currently    Types: Marijuana     Review of Systems  Constitutional: No fever/chills Eyes: No visual changes. No discharge ENT: No upper respiratory complaints. Cardiovascular: no chest pain. Respiratory: no cough. No SOB. Gastrointestinal: No abdominal pain.  No nausea, no vomiting.  No diarrhea.  No constipation. Genitourinary: Negative for dysuria. No hematuria Musculoskeletal: Ongoing sacral pain Skin: Negative for rash, abrasions, lacerations, ecchymosis. Neurological: Negative for headaches, focal weakness or numbness. 10-point  ROS otherwise negative.  ____________________________________________   PHYSICAL EXAM:  VITAL SIGNS: ED Triage Vitals  Enc Vitals Group     BP 10/30/19 1438 (!) 161/95     Pulse Rate 10/30/19 1438 91     Resp 10/30/19 1438 16     Temp 10/30/19 1438 98.7 F (37.1 C)     Temp Source 10/30/19 1438 Oral     SpO2 10/30/19 1438 91 %     Weight --      Height --      Head Circumference --      Peak Flow --      Pain Score 10/30/19 1434 10     Pain Loc --      Pain Edu? --      Excl. in [place]? --      Constitutional: Alert and oriented. Well appearing and in no acute distress. Eyes: Conjunctivae are normal. PERRL. EOMI. Head: Atraumatic. ENT:      Ears:       Nose: No congestion/rhinnorhea.      Mouth/Throat: Mucous membranes are moist.  Neck: No stridor.    Cardiovascular: Normal rate, regular rhythm. Normal S1 and S2.  Good peripheral  circulation. Respiratory: Normal respiratory effort without tachypnea or retractions. Lungs CTAB. Good air entry to the bases with no decreased or absent breath sounds. Gastrointestinal: Bowel sounds 4 quadrants. Soft and nontender to palpation. No guarding or rigidity. No palpable masses. No distention. No CVA tenderness. Musculoskeletal: Full range of motion to all extremities. No gross deformities appreciated.  No visible signs of trauma to spine, lower extremities.  No evidence of gross erythema or edema extending from the lumbar spine into the superior aspect of the intergluteal cleft.  Patient is able to move both lower extremities appropriately.  Palpation reveals tenderness to palpation over the proximal sacral spine.  Mild tenderness to palpation in the right SI joint extending into the right-sided sciatic notch.  No tenderness to palpation along the left SI joint or sciatic notch.  No tenderness to palpation in the lumbar spine.  No paraspinal muscle tenderness.  Negative straight leg raise bilaterally.  Dorsalis pedis pulse intact and equal bilateral lower extremities.  Sensation intact and equal in all dermatomal distributions.  No evidence of saddle anesthesia on physical exam. Neurologic:  Normal speech and language. No gross focal neurologic deficits are appreciated.  Skin:  Skin is warm, dry and intact. No rash noted. Psychiatric: Mood and affect are normal. Speech and behavior are normal. Patient exhibits appropriate insight and judgement.   ____________________________________________   LABS (all labs ordered are listed, but only abnormal results are displayed)  Labs Reviewed - No data to display ____________________________________________  EKG   ____________________________________________  RADIOLOGY I personally viewed and evaluated these images as part of my medical decision making, as well as reviewing the written report by the radiologist.  CT PELVIS WO  CONTRAST  Result Date: 10/30/2019 CLINICAL DATA:  Pelvic fracture. EXAM: CT PELVIS WITHOUT CONTRAST TECHNIQUE: Multidetector CT imaging of the pelvis was performed following the standard protocol without intravenous contrast. COMPARISON:  None. FINDINGS: Urinary Tract: The urinary bladder appears normal for the degree of distention. Bowel: No small bowel or colonic dilatation within the visualized pelvis. Advanced diverticular disease noted in the sigmoid colon. Vascular/Lymphatic: Atherosclerotic calcification noted in the common and internal iliac arteries bilaterally. No pelvic sidewall lymphadenopathy. Reproductive: The uterus is surgically absent. There is no adnexal mass. Other:  No intraperitoneal free fluid. Musculoskeletal: No evidence  of an acute fracture. Specifically, there is no fracture of the right sacral ala. Degenerative spurring noted in the femoral heads bilaterally. IMPRESSION: 1. No acute bony abnormality. Specifically, no fracture of the right sacrum as questioned on previous lumbar spine x-ray. 2. Sigmoid diverticulosis. Electronically Signed   By: Kennith Center M.D.   On: 10/30/2019 15:48    ____________________________________________    PROCEDURES  Procedure(s) performed:    Procedures    Medications  morphine 4 MG/ML injection 4 mg (has no administration in time range)  ondansetron (ZOFRAN-ODT) disintegrating tablet 8 mg (has no administration in time range)  ketorolac (TORADOL) 30 MG/ML injection 30 mg (has no administration in time range)     ____________________________________________   INITIAL IMPRESSION / ASSESSMENT AND PLAN / ED COURSE  Pertinent labs & imaging results that were available during my care of the patient were reviewed by me and considered in my medical decision making (see chart for details).  Review of the [place] CSRS was performed in accordance of the [facility] prior to dispensing any controlled drugs.           Patient's diagnosis is  consistent with sacral back pain.  Patient presented to the emergency department with ongoing "tailbone" pain.  Patient denies any recent trauma to the area.  She had been assessed on the fifth of this month for similar complaint.  Initial imaging, plain films had been concerning for possible insufficiency fracture.  With ongoing pain, patient was evaluated with CT scan of the pelvis.  No evidence of fracture at this time.  Patient is neurologically intact.  She does have chronic pain and is on chronic pain management.  Patient is also scheduled for physical therapy.  I will provide injection of morphine, Toradol here in the emergency department and recommend ongoing chronic pain meds to manage her chronic pain..  Patient should follow-up with pain management if she feels that she needs different treatment therapies.  Otherwise follow-up primary care as needed.  Patient is given ED precautions to return to the ED for any worsening or new symptoms.     ____________________________________________  FINAL CLINICAL IMPRESSION(S) / ED DIAGNOSES  Final diagnoses:  Sacral back pain      NEW MEDICATIONS STARTED DURING THIS VISIT:  ED Discharge Orders    None          This chart was dictated using voice recognition software/Dragon. Despite best efforts to proofread, errors can occur which can change the meaning. Any change was purely unintentional.    Racheal Patches, PA-C 10/30/19 1619    Minna Antis, MD 10/30/19 2113

## 2019-10-31 ENCOUNTER — Other Ambulatory Visit: Payer: Self-pay | Admitting: Physician Assistant

## 2019-10-31 DIAGNOSIS — R200 Anesthesia of skin: Secondary | ICD-10-CM

## 2019-10-31 DIAGNOSIS — M545 Low back pain, unspecified: Secondary | ICD-10-CM

## 2019-10-31 DIAGNOSIS — R32 Unspecified urinary incontinence: Secondary | ICD-10-CM

## 2019-10-31 DIAGNOSIS — R159 Full incontinence of feces: Secondary | ICD-10-CM

## 2019-11-01 ENCOUNTER — Other Ambulatory Visit: Payer: Self-pay

## 2019-11-01 ENCOUNTER — Ambulatory Visit
Admission: RE | Admit: 2019-11-01 | Discharge: 2019-11-01 | Disposition: A | Discharge status: Home / Self Care | Payer: Medicare HMO | Source: Ambulatory Visit | Attending: Physician Assistant | Admitting: Physician Assistant

## 2019-11-01 DIAGNOSIS — R200 Anesthesia of skin: Secondary | ICD-10-CM | POA: Diagnosis present

## 2019-11-01 DIAGNOSIS — R159 Full incontinence of feces: Secondary | ICD-10-CM | POA: Diagnosis present

## 2019-11-01 DIAGNOSIS — M545 Low back pain, unspecified: Secondary | ICD-10-CM

## 2019-11-01 DIAGNOSIS — R32 Unspecified urinary incontinence: Secondary | ICD-10-CM | POA: Diagnosis present

## 2019-11-01 LAB — POCT I-STAT CREATININE: Creatinine, Ser: 0.9 mg/dL (ref 0.44–1.00)

## 2019-11-01 IMAGING — MR MR LUMBAR SPINE WO/W CM
6 of 7 series · 33 of 48 positions shown · IV contrast (gadavist)
Comparison: None.

CLINICAL DATA: Severe low back pain.   Bowel incontinence.

EXAM:
MRI LUMBAR SPINE WITHOUT AND WITH CONTRAST
TECHNIQUE: Multiplanar and multiecho pulse sequences of the lumbar spine were
obtained without and with intravenous contrast.
CONTRAST:  7.5mL GADAVIST GADOBUTROL 1 MMOL/ML IV SOLN

[Series 9: T2 · sagittal · 4.0mm · 1.02mm/px · 4 of 17 slices shown (1 of 2)]
[im 1/17]
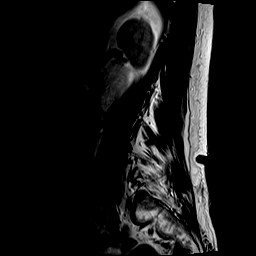
[im 6/17]
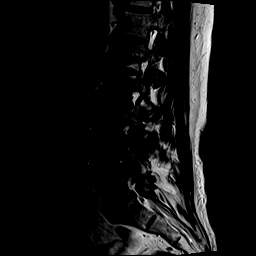
[im 11/17]
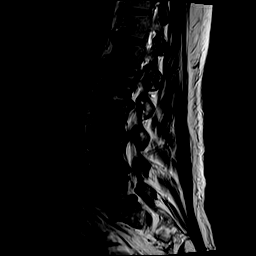
[im 17/17]
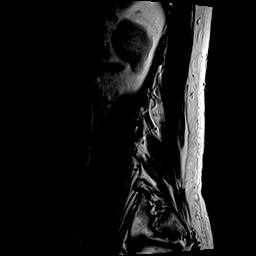

[Series 10: T1 · sagittal · 4.0mm · 1.02mm/px · 4 of 17 slices shown (1 of 2)]
[im 1/17]
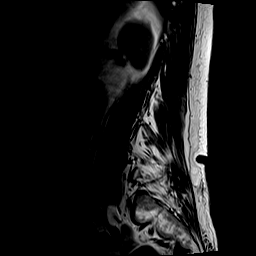
[im 6/17]
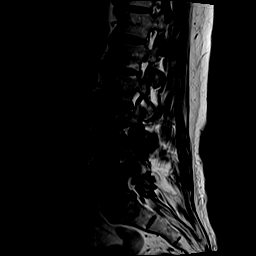
[im 11/17]
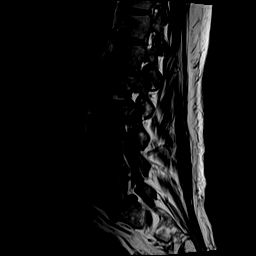
[im 17/17]
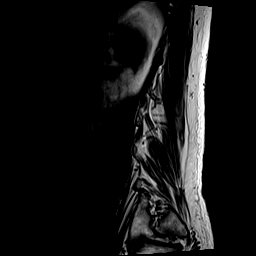

[Series 11: STIR · sagittal · 4.0mm · 0.51mm/px · 4 of 17 slices shown]
[im 1/17]
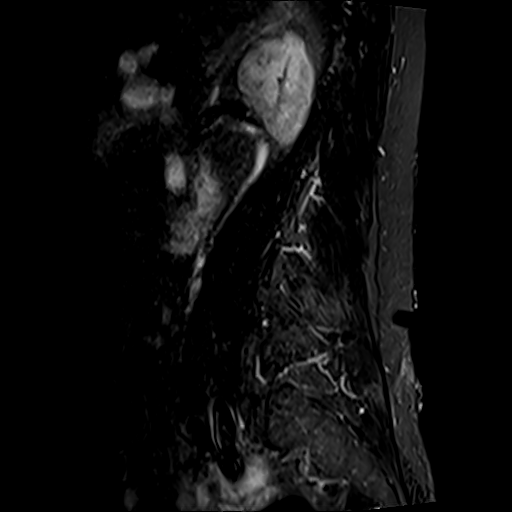
[im 5/17]
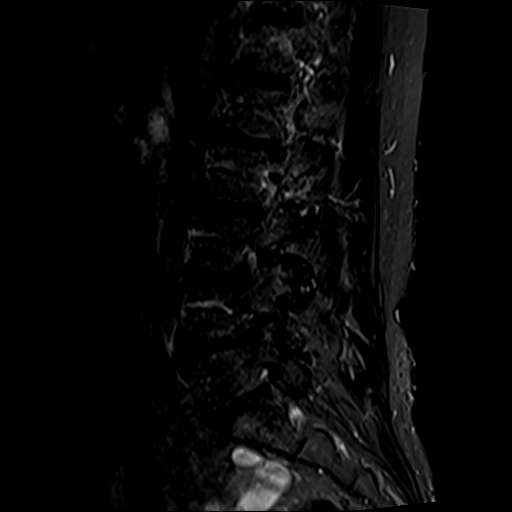
[im 9/17]
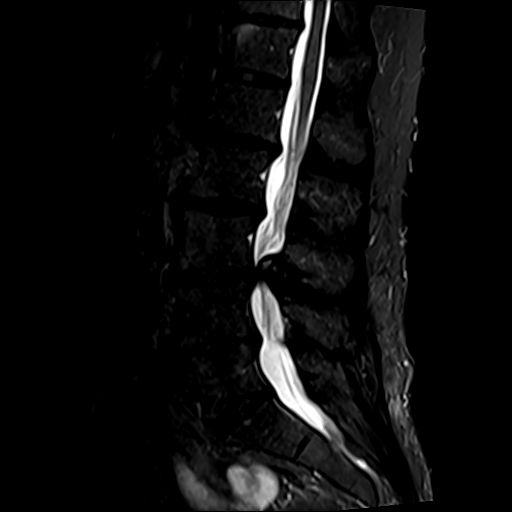
[im 13/17]
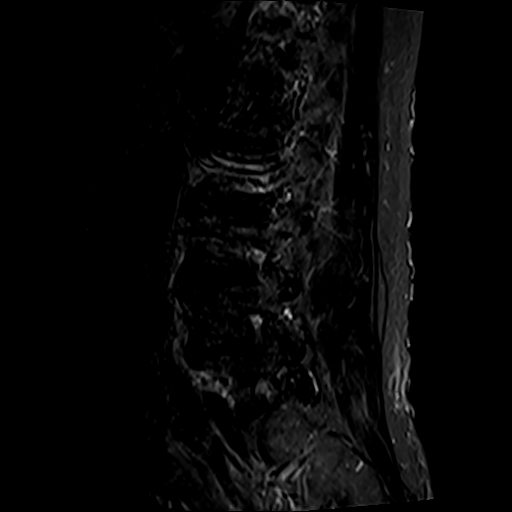

[Series 12: T2 · axial · 4.0mm · 0.78mm/px · z∈[+44,+247]mm · 8 of 34 slices shown (2 of 2)]
[im 1/34]
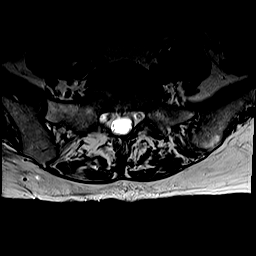
[im 4/34]
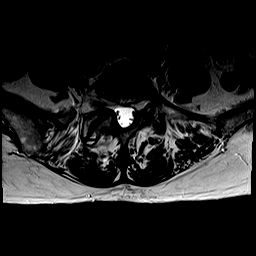
[im 12/34]
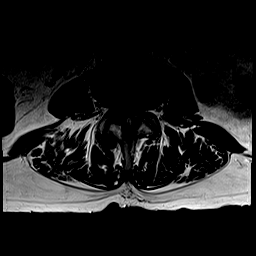
[im 15/34]
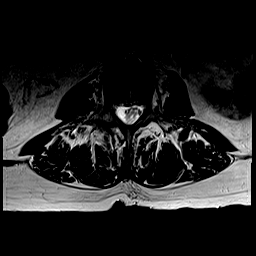
[im 19/34]
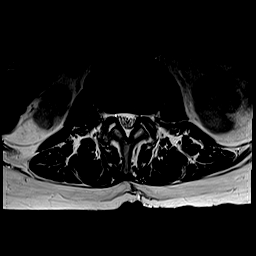
[im 23/34]
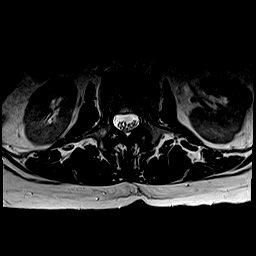
[im 30/34]
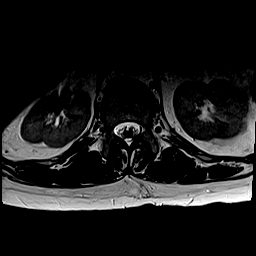
[im 34/34]
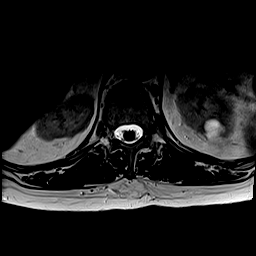

[Series 13: T1 · axial · 4.0mm · 0.39mm/px · z∈[+44,+247]mm · 8 of 34 slices shown (2 of 2)]
[im 1/34]
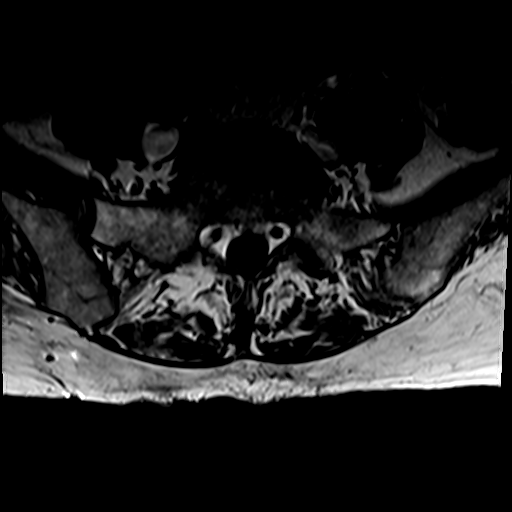
[im 4/34]
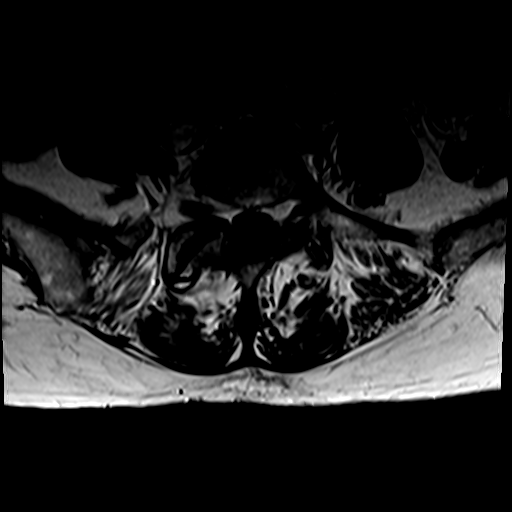
[im 12/34]
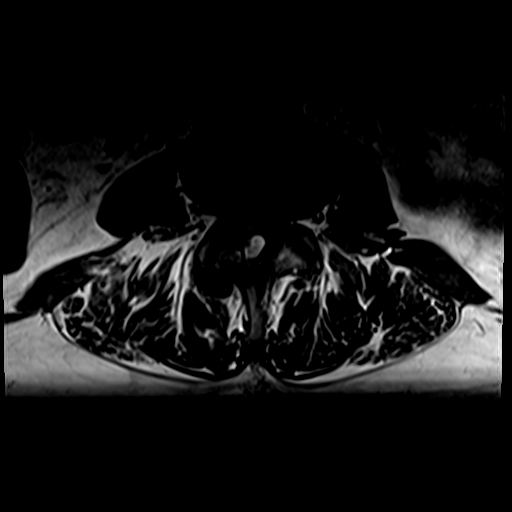
[im 15/34]
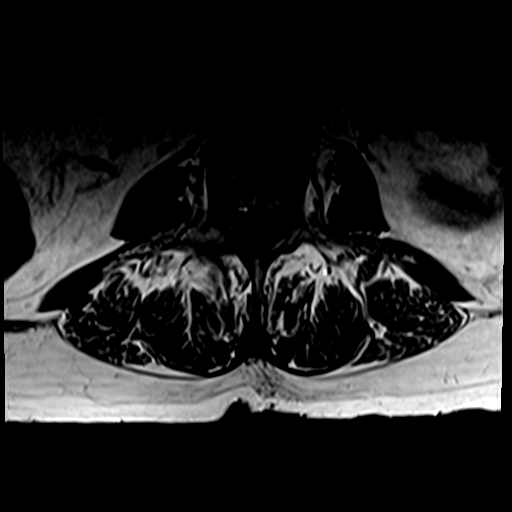
[im 19/34]
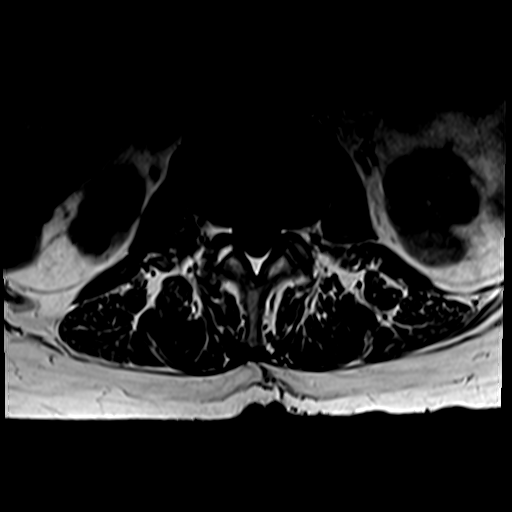
[im 23/34]
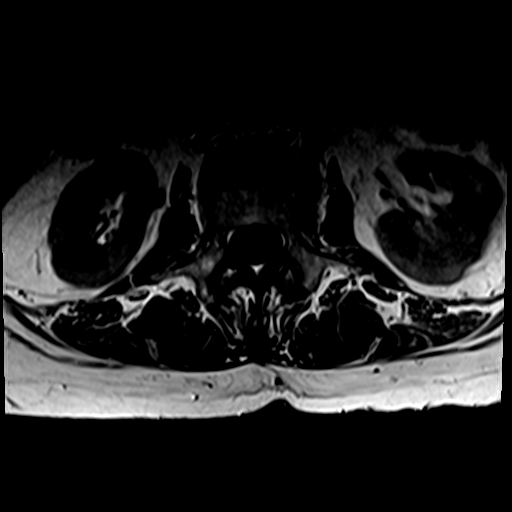
[im 30/34]
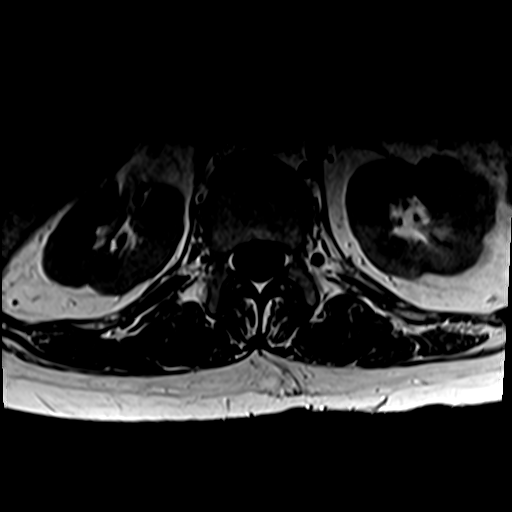
[im 34/34]
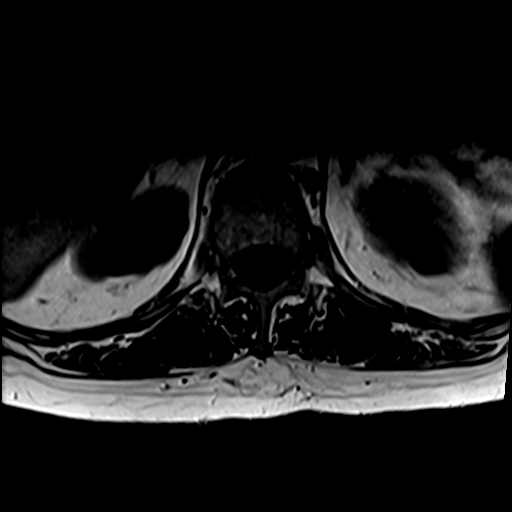

[Series 19: T1 fat-sat post-contrast · sagittal · 4.0mm · 1.02mm/px · 5 of 17 slices shown]
[im 1/17]
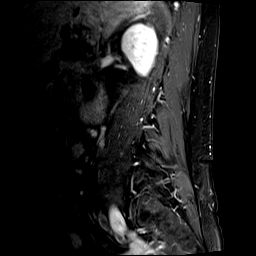
[im 5/17]
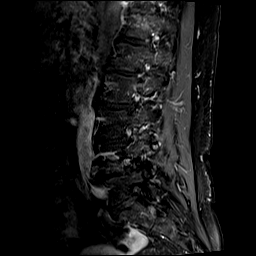
[im 9/17]
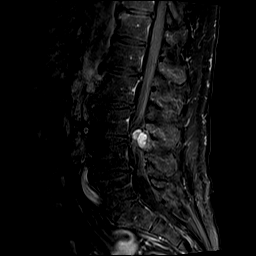
[im 13/17]
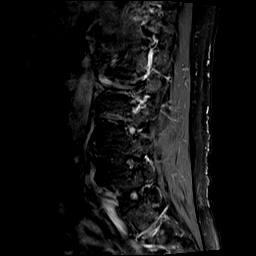
[im 17/17]
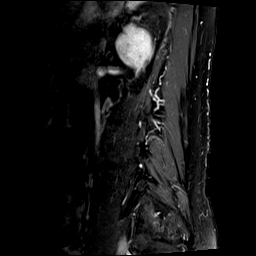

[33 of 48 positions shown; findings below may reference images not displayed]

FINDINGS: Segmentation:  Normal

Alignment:  Slight retrolisthesis L1-2 and L2-3

Vertebrae:  Negative for fracture or mass.

Conus medullaris and cauda equina: Conus extends to the L1-2 level.
Conus and cauda equina appear normal.

Paraspinal and other soft tissues: Negative for paraspinous mass or
adenopathy.

Disc levels:

L1-2: Moderate disc degeneration with diffuse disc bulging and
endplate spurring. Mild facet degeneration and mild spinal stenosis.

L2-3: Diffuse disc bulging with endplate spurring. Bilateral facet
degeneration. Mild narrowing of the canal.

L3-4: Large complex synovial cyst on the right projecting into the
spinal canal. This is hyperintense on T1 prior to contrast. There is
a cluster of 2 or 3 cysts compressing the thecal sac causing severe
spinal stenosis. The cluster of cysts measures approximately 10 x 12
mm and is associated with severe facet degeneration on the right.
There is marked subarticular stenosis on the right.

L4-5: Disc degeneration with disc bulging and spurring, asymmetric
on the right. Severe subarticular and foraminal stenosis on the
right. Mild to moderate spinal stenosis. Bilateral facet
degeneration.

L5-S1: Mild disc and facet degeneration without significant
stenosis.
IMPRESSION: Multilevel degenerative changes.

Degenerative change most severe at L3-4 where there is severe spinal
stenosis. Large complex synovial cyst on the right compressing the
thecal sac and causing severe subarticular stenosis on the right.

These results will be called to the ordering clinician or
representative by the Radiologist Assistant, and communication
documented in the PACS or zVision Dashboard.

## 2019-11-01 MED ORDER — GADOBUTROL 1 MMOL/ML IV SOLN
7.5000 mL | Freq: Once | INTRAVENOUS | Status: AC | PRN
  Administered 2019-11-01: 09:00:00 7.5 mL via INTRAVENOUS

## 2019-11-11 ENCOUNTER — Other Ambulatory Visit: Payer: Self-pay | Admitting: Neurosurgery

## 2019-11-17 ENCOUNTER — Other Ambulatory Visit: Payer: Self-pay

## 2019-11-17 ENCOUNTER — Encounter
Admission: RE | Admit: 2019-11-17 | Discharge: 2019-11-17 | Disposition: A | Discharge status: Home / Self Care | Payer: Medicare HMO | Source: Ambulatory Visit | Attending: Neurosurgery | Admitting: Neurosurgery

## 2019-11-17 HISTORY — DX: Gastro-esophageal reflux disease without esophagitis: K21.9

## 2019-11-17 HISTORY — DX: Unspecified asthma, uncomplicated: J45.909

## 2019-11-17 HISTORY — DX: Dyspnea, unspecified: R06.00

## 2019-11-17 HISTORY — DX: Chronic obstructive pulmonary disease, unspecified: J44.9

## 2019-11-17 NOTE — Pre-Procedure Instructions (Addendum)
Pre-Admit Testing Provider Notification Note  Provider Notified: Dr. Adriana Simas  Notification Mode: Secure Chat  Reason: No orders. "Good afternoon Dr Adriana Simas. I have completed the PAT call for this patient. There are no orders such as consent, antibiotic, etc. I can see the lab orders under lab view that Sharlot Gowda entered needing second sign. Can you sign those and enter the rest of the orders so that I can complete this chart? Thank you. Herbert Seta, RN"   Response: Orders entered/signed.  Additional Information:  Signed: Alvester Morin, RN

## 2019-11-17 NOTE — Patient Instructions (Signed)
Your procedure is scheduled on: Monday 11/24/19.  Report to DAY SURGERY DEPARTMENT LOCATED ON 2ND FLOOR MEDICAL MALL ENTRANCE. To find out your arrival time please call [phone] between 1PM - 3PM on Friday 11/21/19.   Remember: Instructions that are not followed completely may result in serious medical risk, up to and including death, or upon the discretion of your surgeon and anesthesiologist your surgery may need to be rescheduled.      _X__ 1. Do not eat food after midnight the night before your procedure.                 No gum chewing or hard candies. You may drink clear liquids up to 2 hours                 before you are scheduled to arrive for your surgery- DO NOT drink clear                 liquids within 2 hours of the start of your surgery.                 Clear Liquids include:  water, apple juice without pulp, clear carbohydrate                 drink such as Clearfast or Gatorade, Black Coffee or Tea (Do not add                 anything to coffee or tea).   __X__2.  On the morning of surgery brush your teeth with toothpaste and water, you may rinse your mouth with mouthwash if you wish.  Do not swallow any toothpaste or mouthwash.       _X__ 3.  No Alcohol for 24 hours before or after surgery.     _X__ 4.  Do Not Smoke or use e-cigarettes For 24 Hours Prior to Your Surgery.                 Do not use any chewable tobacco products for at least 6 hours prior to                 Surgery.    __X__5.  Notify your doctor if there is any change in your medical condition      (cold, fever, infections).      Do not wear jewelry, make-up, hairpins, clips or nail polish. Do not wear lotions, powders, or perfumes.  Do not shave 48 hours prior to surgery. Men may shave face and neck. Do not bring valuables to the hospital.     [facility] is not responsible for any belongings or valuables.    Contacts, dentures/partials or body piercings may not be worn into surgery.  Bring a case for your contacts, glasses or hearing aids, a denture cup will be supplied.     Patients discharged the day of surgery will not be allowed to drive home.     __X__ Take these medicines the morning of surgery with A SIP OF WATER:     1.  albuterol (VENTOLIN HFA) 108 (90 Base) MCG/ACT inhaler  2. Brexpiprazole (REXULTI) 4 MG TABS  3.FLUoxetine (PROZAC) 20 MG capsule  4. diltiazem (CARDIZEM CD) 240 MG 24 hr capsule  5. omeprazole (PRILOSEC) 40 MG capsule  6. TRELEGY ELLIPTA 100-62.5-25 MCG/INH AEPB  7. tiZANidine (ZANAFLEX) 4 MG tablet if needed  8. oxyCODONE (OXY IR/ROXICODONE) 5 MG immediate release tablet if needed  9. clonazePAM (KLONOPIN) 1 MG tablet  if needed    __X__ Use CHG Soap as directed   __ X__ Use inhalers on the day of surgery.    __X__ Stop Blood Thinners: Aspirin today.   __X__ Stop Anti-inflammatories 7 days before surgery such as Advil, Ibuprofen, Motrin, BC or Goodies Powder, Naprosyn, Naproxen, Aleve, Aspirin, Meloxicam. May take Tylenol if needed for pain or discomfort.    __X__ Don't start taking any new herbal supplements before your procedure.

## 2019-11-20 ENCOUNTER — Encounter
Admission: RE | Admit: 2019-11-20 | Discharge: 2019-11-20 | Disposition: A | Discharge status: Home / Self Care | Payer: Medicare HMO | Source: Ambulatory Visit | Attending: Neurosurgery | Admitting: Neurosurgery

## 2019-11-20 ENCOUNTER — Other Ambulatory Visit: Payer: Self-pay

## 2019-11-20 ENCOUNTER — Encounter: Payer: Self-pay | Admitting: Neurosurgery

## 2019-11-20 DIAGNOSIS — Z01818 Encounter for other preprocedural examination: Secondary | ICD-10-CM | POA: Insufficient documentation

## 2019-11-20 DIAGNOSIS — I10 Essential (primary) hypertension: Secondary | ICD-10-CM | POA: Diagnosis not present

## 2019-11-20 DIAGNOSIS — Z20822 Contact with and (suspected) exposure to covid-19: Secondary | ICD-10-CM | POA: Diagnosis not present

## 2019-11-20 LAB — BASIC METABOLIC PANEL
Anion gap: 12 (ref 5–15)
BUN: 10 mg/dL (ref 8–23)
CO2: 24 mmol/L (ref 22–32)
Calcium: 11.5 mg/dL — ABNORMAL HIGH (ref 8.9–10.3)
Chloride: 104 mmol/L (ref 98–111)
Creatinine, Ser: 0.84 mg/dL (ref 0.44–1.00)
GFR calc Af Amer: >60 mL/min (ref >60)
GFR calc non Af Amer: >60 mL/min (ref >60)
Glucose, Bld: 118 mg/dL — ABNORMAL HIGH (ref 70–99)
Potassium: 4.2 mmol/L (ref 3.5–5.1)
Sodium: 140 mmol/L (ref 135–145)

## 2019-11-20 LAB — URINALYSIS, ROUTINE W REFLEX MICROSCOPIC
Bacteria, UA: NONE SEEN
Bilirubin Urine: NEGATIVE
Glucose, UA: NEGATIVE mg/dL
Hgb urine dipstick: NEGATIVE
Ketones, ur: NEGATIVE mg/dL
Leukocytes,Ua: NEGATIVE
Nitrite: NEGATIVE
Protein, ur: 30 mg/dL — AB
Specific Gravity, Urine: 1.016 (ref 1.005–1.030)
pH: 7 (ref 5.0–8.0)

## 2019-11-20 LAB — TYPE AND SCREEN
ABO/RH(D): O POS
Antibody Screen: NEGATIVE

## 2019-11-20 LAB — CBC
HCT: 50.3 % — ABNORMAL HIGH (ref 36.0–46.0)
Hemoglobin: 16.4 g/dL — ABNORMAL HIGH (ref 12.0–15.0)
MCH: 31.2 pg (ref 26.0–34.0)
MCHC: 32.6 g/dL (ref 30.0–36.0)
MCV: 95.8 fL (ref 80.0–100.0)
Platelets: 316 10*3/uL (ref 150–400)
RBC: 5.25 MIL/uL — ABNORMAL HIGH (ref 3.87–5.11)
RDW: 13.8 % (ref 11.5–15.5)
WBC: 7.9 10*3/uL (ref 4.0–10.5)
nRBC: 0 % (ref 0.0–0.2)

## 2019-11-20 LAB — PROTIME-INR
INR: 1 (ref 0.8–1.2)
Prothrombin Time: 13 seconds (ref 11.4–15.2)

## 2019-11-20 LAB — SARS CORONAVIRUS 2 (TAT 6-24 HRS): SARS Coronavirus 2: NEGATIVE

## 2019-11-20 LAB — APTT: aPTT: 28 seconds (ref 24–36)

## 2019-11-20 LAB — SURGICAL PCR SCREEN
MRSA, PCR: NEGATIVE
Staphylococcus aureus: NEGATIVE

## 2019-11-20 NOTE — Pre-Procedure Instructions (Addendum)
Abnormal EKG 11/19/10. Clearance requested per Dr Karlton Lemon. Faxed info to [employer].  11/20/19 1458 cOPY OF TODAY'S ekg WAS SENT BACK FROM [facility] OFF[facility] WITH NOTE ON ekg PATIENT IS CLEARED FOR SURGERY.

## 2019-11-24 ENCOUNTER — Ambulatory Visit: Payer: Medicare HMO

## 2019-11-24 ENCOUNTER — Observation Stay
Admission: RE | Admit: 2019-11-24 | Discharge: 2019-11-25 | Disposition: A | Discharge status: Home / Self Care | Payer: Medicare HMO | Attending: Neurosurgery | Admitting: Neurosurgery

## 2019-11-24 ENCOUNTER — Ambulatory Visit: Payer: Medicare HMO | Admitting: Anesthesiology

## 2019-11-24 ENCOUNTER — Encounter
Admission: RE | Disposition: A | Discharge status: Home / Self Care | Payer: Self-pay | Source: Home / Self Care | Attending: Neurosurgery

## 2019-11-24 ENCOUNTER — Encounter: Payer: Self-pay | Admitting: Neurosurgery

## 2019-11-24 ENCOUNTER — Other Ambulatory Visit: Payer: Self-pay

## 2019-11-24 DIAGNOSIS — I110 Hypertensive heart disease with heart failure: Secondary | ICD-10-CM | POA: Diagnosis not present

## 2019-11-24 DIAGNOSIS — M1990 Unspecified osteoarthritis, unspecified site: Secondary | ICD-10-CM | POA: Diagnosis not present

## 2019-11-24 DIAGNOSIS — M48061 Spinal stenosis, lumbar region without neurogenic claudication: Secondary | ICD-10-CM | POA: Diagnosis present

## 2019-11-24 DIAGNOSIS — Z7982 Long term (current) use of aspirin: Secondary | ICD-10-CM | POA: Insufficient documentation

## 2019-11-24 DIAGNOSIS — M797 Fibromyalgia: Secondary | ICD-10-CM | POA: Insufficient documentation

## 2019-11-24 DIAGNOSIS — F329 Major depressive disorder, single episode, unspecified: Secondary | ICD-10-CM | POA: Insufficient documentation

## 2019-11-24 DIAGNOSIS — Z888 Allergy status to other drugs, medicaments and biological substances status: Secondary | ICD-10-CM | POA: Insufficient documentation

## 2019-11-24 DIAGNOSIS — K219 Gastro-esophageal reflux disease without esophagitis: Secondary | ICD-10-CM | POA: Insufficient documentation

## 2019-11-24 DIAGNOSIS — M5416 Radiculopathy, lumbar region: Secondary | ICD-10-CM | POA: Insufficient documentation

## 2019-11-24 DIAGNOSIS — I252 Old myocardial infarction: Secondary | ICD-10-CM | POA: Diagnosis not present

## 2019-11-24 DIAGNOSIS — M7138 Other bursal cyst, other site: Secondary | ICD-10-CM | POA: Insufficient documentation

## 2019-11-24 DIAGNOSIS — J449 Chronic obstructive pulmonary disease, unspecified: Secondary | ICD-10-CM | POA: Diagnosis not present

## 2019-11-24 DIAGNOSIS — Z9889 Other specified postprocedural states: Secondary | ICD-10-CM

## 2019-11-24 DIAGNOSIS — Z79899 Other long term (current) drug therapy: Secondary | ICD-10-CM | POA: Insufficient documentation

## 2019-11-24 DIAGNOSIS — Z7951 Long term (current) use of inhaled steroids: Secondary | ICD-10-CM | POA: Diagnosis not present

## 2019-11-24 DIAGNOSIS — F419 Anxiety disorder, unspecified: Secondary | ICD-10-CM | POA: Insufficient documentation

## 2019-11-24 DIAGNOSIS — Z419 Encounter for procedure for purposes other than remedying health state, unspecified: Secondary | ICD-10-CM

## 2019-11-24 HISTORY — PX: LUMBAR LAMINECTOMY/DECOMPRESSION MICRODISCECTOMY: SHX5026

## 2019-11-24 LAB — ABO/RH: ABO/RH(D): O POS

## 2019-11-24 IMAGING — RF DG LUMBAR SPINE 2-3V
1 series · 1 of 1 positions shown · non-contrast
Comparison: Lumbar radiographs [DATE]

CLINICAL DATA: Back surgery

EXAM:
DG C-ARM 1-60 MIN; LUMBAR SPINE - 2-3 VIEW

[Series 1: dg no report - auto finalize · 0.20mm/px · 1 of 1 slices shown]
[im 1/1]
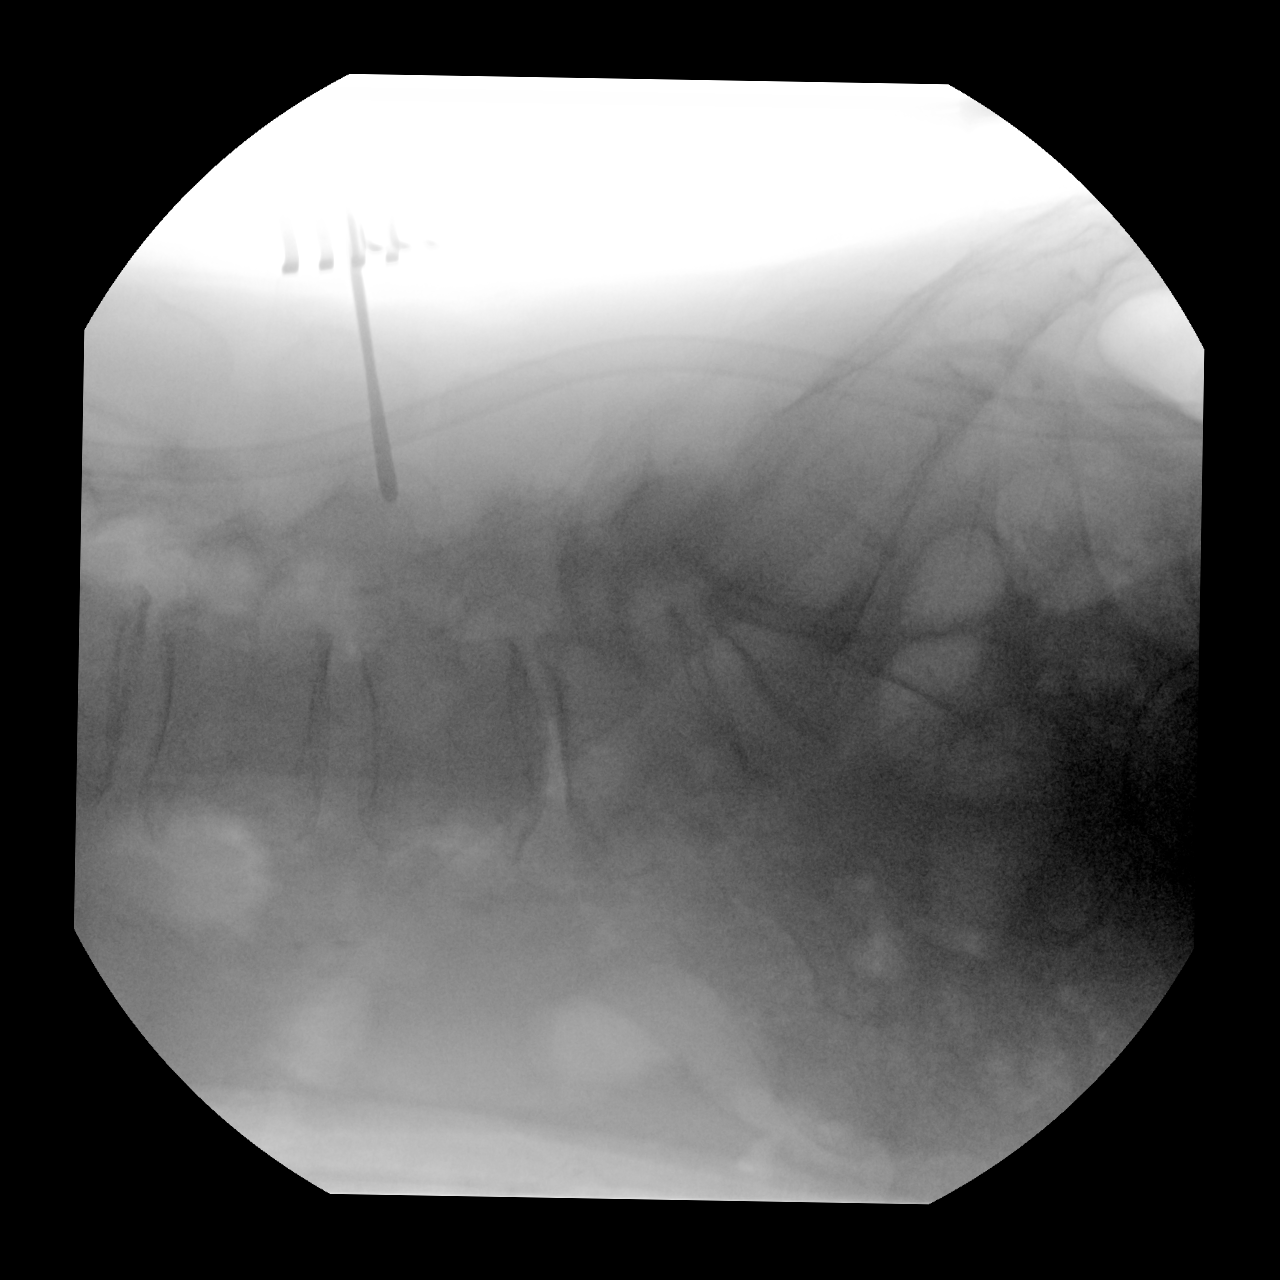

[1 of 1 positions shown; findings below may reference images not displayed]

FINDINGS: Single lateral C-arm images of the lumbar spine is obtained in the
operating room. Based on prior studies, the lumbosacral junction is
L5-S1.

Surgical instrument overlying the spinous process of L3.
IMPRESSION: Spinous process of L3 localized in the operating room.

## 2019-11-24 IMAGING — RF DG C-ARM 1-60 MIN
1 series · 1 of 1 positions shown · non-contrast
Comparison: Lumbar radiographs [DATE]

CLINICAL DATA: Back surgery

EXAM:
DG C-ARM 1-60 MIN; LUMBAR SPINE - 2-3 VIEW

[Series 1: dg no report - auto finalize · 0.20mm/px · 1 of 1 slices shown]
[im 1/1]
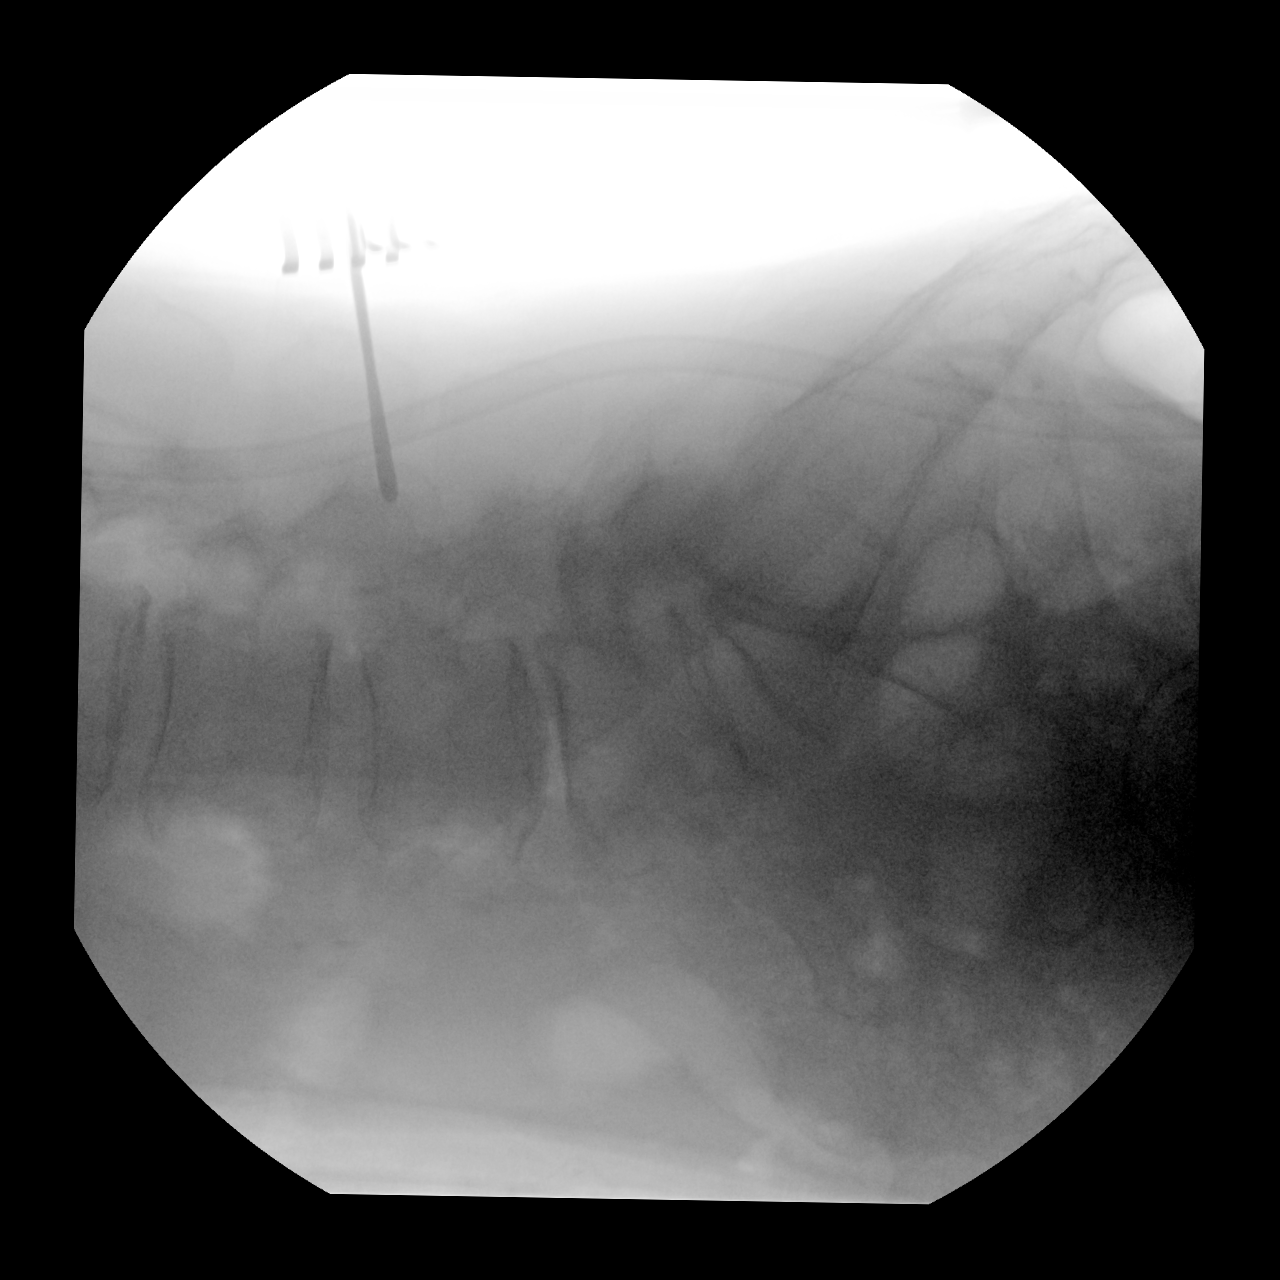

[1 of 1 positions shown; findings below may reference images not displayed]

FINDINGS: Single lateral C-arm images of the lumbar spine is obtained in the
operating room. Based on prior studies, the lumbosacral junction is
L5-S1.

Surgical instrument overlying the spinous process of L3.
IMPRESSION: Spinous process of L3 localized in the operating room.

## 2019-11-24 SURGERY — LUMBAR LAMINECTOMY/DECOMPRESSION MICRODISCECTOMY 2 LEVELS
Anesthesia: General | Laterality: Right

## 2019-11-24 MED ORDER — LACTATED RINGERS IV SOLN: INTRAVENOUS | Status: DC

## 2019-11-24 MED ORDER — FLUOXETINE HCL 20 MG PO CAPS
60.0000 mg | ORAL_CAPSULE | Freq: Every day | ORAL | Status: DC
  Administered 2019-11-25: 60 mg via ORAL
  Filled 2019-11-24 (×2): qty 3

## 2019-11-24 MED ORDER — CLONAZEPAM 1 MG PO TABS: 1.0000 mg | ORAL_TABLET | Freq: Two times a day (BID) | ORAL | Status: DC | PRN

## 2019-11-24 MED ORDER — LIDOCAINE HCL (CARDIAC) PF 100 MG/5ML IV SOSY
PREFILLED_SYRINGE | INTRAVENOUS | Status: DC | PRN
  Administered 2019-11-24: 80 mg via INTRAVENOUS

## 2019-11-24 MED ORDER — ONDANSETRON HCL 4 MG/2ML IJ SOLN
INTRAMUSCULAR | Status: AC
  Filled 2019-11-24: qty 2

## 2019-11-24 MED ORDER — PHENYLEPHRINE HCL (PRESSORS) 10 MG/ML IV SOLN
INTRAVENOUS | Status: AC
  Filled 2019-11-24: qty 1

## 2019-11-24 MED ORDER — ACETAMINOPHEN 500 MG PO TABS
1000.0000 mg | ORAL_TABLET | Freq: Four times a day (QID) | ORAL | Status: AC
  Administered 2019-11-24 – 2019-11-25 (×3): 1000 mg via ORAL
  Filled 2019-11-24 (×3): qty 2

## 2019-11-24 MED ORDER — ACETAMINOPHEN 650 MG RE SUPP: 650.0000 mg | RECTAL | Status: DC | PRN

## 2019-11-24 MED ORDER — SODIUM CHLORIDE 0.9 % IV SOLN: 250.0000 mL | INTRAVENOUS | Status: DC

## 2019-11-24 MED ORDER — SODIUM CHLORIDE 0.9 % IV SOLN: INTRAVENOUS | Status: DC

## 2019-11-24 MED ORDER — SODIUM CHLORIDE 0.9% FLUSH: 3.0000 mL | INTRAVENOUS | Status: DC | PRN

## 2019-11-24 MED ORDER — KETOROLAC TROMETHAMINE 30 MG/ML IJ SOLN
INTRAMUSCULAR | Status: AC
  Filled 2019-11-24: qty 1

## 2019-11-24 MED ORDER — OXYCODONE HCL 5 MG/5ML PO SOLN: 5.0000 mg | Freq: Once | ORAL | Status: DC | PRN

## 2019-11-24 MED ORDER — PHENYLEPHRINE HCL-NACL 20-0.9 MG/250ML-% IV SOLN
INTRAVENOUS | Status: DC | PRN
  Administered 2019-11-24: 50 ug/min via INTRAVENOUS

## 2019-11-24 MED ORDER — SUCCINYLCHOLINE CHLORIDE 20 MG/ML IJ SOLN
INTRAMUSCULAR | Status: DC | PRN
  Administered 2019-11-24: 120 mg via INTRAVENOUS

## 2019-11-24 MED ORDER — CEFAZOLIN SODIUM-DEXTROSE 2-4 GM/100ML-% IV SOLN
INTRAVENOUS | Status: AC
  Filled 2019-11-24: qty 100

## 2019-11-24 MED ORDER — DEXAMETHASONE SODIUM PHOSPHATE 10 MG/ML IJ SOLN
INTRAMUSCULAR | Status: DC | PRN
  Administered 2019-11-24: 10 mg via INTRAVENOUS

## 2019-11-24 MED ORDER — ONDANSETRON HCL 4 MG PO TABS: 4.0000 mg | ORAL_TABLET | Freq: Four times a day (QID) | ORAL | Status: DC | PRN

## 2019-11-24 MED ORDER — METHYLPREDNISOLONE ACETATE 40 MG/ML IJ SUSP
INTRAMUSCULAR | Status: AC
  Filled 2019-11-24: qty 1

## 2019-11-24 MED ORDER — SODIUM CHLORIDE 0.9% FLUSH
3.0000 mL | Freq: Two times a day (BID) | INTRAVENOUS | Status: DC
  Administered 2019-11-24: 3 mL via INTRAVENOUS

## 2019-11-24 MED ORDER — HYDROMORPHONE HCL 1 MG/ML IJ SOLN
INTRAMUSCULAR | Status: DC | PRN
  Administered 2019-11-24 (×3): .2 mg via INTRAVENOUS

## 2019-11-24 MED ORDER — PROPOFOL 10 MG/ML IV BOLUS
INTRAVENOUS | Status: DC | PRN
  Administered 2019-11-24: 50 mg via INTRAVENOUS
  Administered 2019-11-24: 130 mg via INTRAVENOUS
  Administered 2019-11-24: 30 mg via INTRAVENOUS
  Administered 2019-11-24: 40 mg via INTRAVENOUS

## 2019-11-24 MED ORDER — FENTANYL CITRATE (PF) 100 MCG/2ML IJ SOLN
INTRAMUSCULAR | Status: AC
  Administered 2019-11-24: 25 ug via INTRAVENOUS
  Filled 2019-11-24: qty 2

## 2019-11-24 MED ORDER — SENNA 8.6 MG PO TABS
1.0000 | ORAL_TABLET | Freq: Two times a day (BID) | ORAL | Status: DC
  Administered 2019-11-24 – 2019-11-25 (×3): 8.6 mg via ORAL
  Filled 2019-11-24 (×3): qty 1

## 2019-11-24 MED ORDER — PHENYLEPHRINE HCL (PRESSORS) 10 MG/ML IV SOLN
INTRAVENOUS | Status: DC | PRN
  Administered 2019-11-24 (×2): 200 ug via INTRAVENOUS
  Administered 2019-11-24: 100 ug via INTRAVENOUS
  Administered 2019-11-24: 200 ug via INTRAVENOUS
  Administered 2019-11-24: 100 ug via INTRAVENOUS
  Administered 2019-11-24: 200 ug via INTRAVENOUS
  Administered 2019-11-24: 100 ug via INTRAVENOUS
  Administered 2019-11-24: 200 ug via INTRAVENOUS

## 2019-11-24 MED ORDER — SODIUM CHLORIDE 0.9% FLUSH: 3.0000 mL | Freq: Two times a day (BID) | INTRAVENOUS | Status: DC

## 2019-11-24 MED ORDER — KETOROLAC TROMETHAMINE 30 MG/ML IJ SOLN
30.0000 mg | Freq: Once | INTRAMUSCULAR | Status: AC
  Administered 2019-11-24: 30 mg via INTRAVENOUS

## 2019-11-24 MED ORDER — SEVOFLURANE IN SOLN
RESPIRATORY_TRACT | Status: AC
  Filled 2019-11-24: qty 250

## 2019-11-24 MED ORDER — LISINOPRIL 20 MG PO TABS
20.0000 mg | ORAL_TABLET | Freq: Every day | ORAL | Status: DC
  Administered 2019-11-25: 20 mg via ORAL
  Filled 2019-11-24: qty 1

## 2019-11-24 MED ORDER — SUCCINYLCHOLINE CHLORIDE 20 MG/ML IJ SOLN
INTRAMUSCULAR | Status: AC
  Filled 2019-11-24: qty 1

## 2019-11-24 MED ORDER — PROPOFOL 10 MG/ML IV BOLUS
INTRAVENOUS | Status: AC
  Filled 2019-11-24: qty 20

## 2019-11-24 MED ORDER — METHYLPREDNISOLONE ACETATE 40 MG/ML IJ SUSP
INTRAMUSCULAR | Status: DC | PRN
  Administered 2019-11-24: 40 mg

## 2019-11-24 MED ORDER — KETAMINE HCL 50 MG/ML IJ SOLN
INTRAMUSCULAR | Status: AC
  Filled 2019-11-24: qty 10

## 2019-11-24 MED ORDER — MAGNESIUM CITRATE PO SOLN
1.0000 | Freq: Once | ORAL | Status: DC | PRN
  Filled 2019-11-24: qty 296

## 2019-11-24 MED ORDER — PHENOL 1.4 % MT LIQD
1.0000 | OROMUCOSAL | Status: DC | PRN
  Filled 2019-11-24: qty 177

## 2019-11-24 MED ORDER — BREXPIPRAZOLE 1 MG PO TABS
4.0000 mg | ORAL_TABLET | Freq: Every day | ORAL | Status: DC
  Administered 2019-11-25: 4 mg via ORAL
  Filled 2019-11-24: qty 4

## 2019-11-24 MED ORDER — ALBUTEROL SULFATE (2.5 MG/3ML) 0.083% IN NEBU: 2.5000 mg | INHALATION_SOLUTION | Freq: Four times a day (QID) | RESPIRATORY_TRACT | Status: DC | PRN

## 2019-11-24 MED ORDER — MENTHOL 3 MG MT LOZG
1.0000 | LOZENGE | OROMUCOSAL | Status: DC | PRN
  Filled 2019-11-24: qty 9

## 2019-11-24 MED ORDER — PANTOPRAZOLE SODIUM 40 MG PO TBEC
40.0000 mg | DELAYED_RELEASE_TABLET | Freq: Every day | ORAL | Status: DC
  Administered 2019-11-25: 40 mg via ORAL
  Filled 2019-11-24: qty 1

## 2019-11-24 MED ORDER — CEFAZOLIN SODIUM-DEXTROSE 2-4 GM/100ML-% IV SOLN
2.0000 g | Freq: Once | INTRAVENOUS | Status: AC
  Administered 2019-11-24 (×2): 2 g via INTRAVENOUS

## 2019-11-24 MED ORDER — OXYCODONE HCL 5 MG PO TABS
5.0000 mg | ORAL_TABLET | ORAL | Status: DC | PRN
  Administered 2019-11-25: 5 mg via ORAL
  Filled 2019-11-24: qty 1

## 2019-11-24 MED ORDER — ONDANSETRON HCL 4 MG/2ML IJ SOLN: 4.0000 mg | Freq: Four times a day (QID) | INTRAMUSCULAR | Status: DC | PRN

## 2019-11-24 MED ORDER — HYDROMORPHONE HCL 1 MG/ML IJ SOLN
INTRAMUSCULAR | Status: AC
  Filled 2019-11-24: qty 1

## 2019-11-24 MED ORDER — THROMBIN 5000 UNITS EX SOLR
CUTANEOUS | Status: AC
  Filled 2019-11-24: qty 5000

## 2019-11-24 MED ORDER — EPHEDRINE SULFATE 50 MG/ML IJ SOLN
INTRAMUSCULAR | Status: AC
  Filled 2019-11-24: qty 1

## 2019-11-24 MED ORDER — FENTANYL CITRATE (PF) 250 MCG/5ML IJ SOLN
INTRAMUSCULAR | Status: AC
  Filled 2019-11-24: qty 5

## 2019-11-24 MED ORDER — DEXAMETHASONE SODIUM PHOSPHATE 10 MG/ML IJ SOLN
INTRAMUSCULAR | Status: AC
  Filled 2019-11-24: qty 1

## 2019-11-24 MED ORDER — LIDOCAINE HCL (PF) 2 % IJ SOLN
INTRAMUSCULAR | Status: AC
  Filled 2019-11-24: qty 10

## 2019-11-24 MED ORDER — SODIUM CHLORIDE (PF) 0.9 % IJ SOLN
INTRAMUSCULAR | Status: AC
  Filled 2019-11-24: qty 10

## 2019-11-24 MED ORDER — GELATIN ABSORBABLE 12-7 MM EX MISC
CUTANEOUS | Status: AC
  Filled 2019-11-24: qty 1

## 2019-11-24 MED ORDER — KETAMINE HCL 50 MG/ML IJ SOLN
INTRAMUSCULAR | Status: DC | PRN
  Administered 2019-11-24: 30 mg via INTRAMUSCULAR
  Administered 2019-11-24: 20 mg via INTRAMUSCULAR

## 2019-11-24 MED ORDER — ROCURONIUM BROMIDE 50 MG/5ML IV SOLN
INTRAVENOUS | Status: AC
  Filled 2019-11-24: qty 1

## 2019-11-24 MED ORDER — BUPIVACAINE-EPINEPHRINE (PF) 0.5% -1:200000 IJ SOLN
INTRAMUSCULAR | Status: AC
  Filled 2019-11-24: qty 30

## 2019-11-24 MED ORDER — FENTANYL CITRATE (PF) 100 MCG/2ML IJ SOLN
INTRAMUSCULAR | Status: DC | PRN
  Administered 2019-11-24: 50 ug via INTRAVENOUS
  Administered 2019-11-24: 100 ug via INTRAVENOUS
  Administered 2019-11-24 (×2): 50 ug via INTRAVENOUS

## 2019-11-24 MED ORDER — DILTIAZEM HCL ER COATED BEADS 240 MG PO CP24
240.0000 mg | ORAL_CAPSULE | Freq: Every day | ORAL | Status: DC
  Administered 2019-11-25: 240 mg via ORAL
  Filled 2019-11-24: qty 1

## 2019-11-24 MED ORDER — FLUTICASONE-UMECLIDIN-VILANT 100-62.5-25 MCG/INH IN AEPB: 2.0000 | INHALATION_SPRAY | Freq: Every day | RESPIRATORY_TRACT | Status: DC

## 2019-11-24 MED ORDER — OXYCODONE HCL 5 MG PO TABS: 5.0000 mg | ORAL_TABLET | Freq: Once | ORAL | Status: DC | PRN

## 2019-11-24 MED ORDER — METHOCARBAMOL 1000 MG/10ML IJ SOLN
500.0000 mg | Freq: Four times a day (QID) | INTRAVENOUS | Status: DC
  Administered 2019-11-24: 500 mg via INTRAVENOUS
  Filled 2019-11-24 (×10): qty 5

## 2019-11-24 MED ORDER — CEFAZOLIN SODIUM-DEXTROSE 2-3 GM-%(50ML) IV SOLR
INTRAVENOUS | Status: DC | PRN
  Administered 2019-11-24: 2 g via INTRAVENOUS

## 2019-11-24 MED ORDER — ONDANSETRON HCL 4 MG/2ML IJ SOLN
INTRAMUSCULAR | Status: DC | PRN
  Administered 2019-11-24: 4 mg via INTRAVENOUS

## 2019-11-24 MED ORDER — HYDROMORPHONE HCL 1 MG/ML IJ SOLN: 0.5000 mg | INTRAMUSCULAR | Status: DC | PRN

## 2019-11-24 MED ORDER — BISACODYL 5 MG PO TBEC: 5.0000 mg | DELAYED_RELEASE_TABLET | Freq: Every day | ORAL | Status: DC | PRN

## 2019-11-24 MED ORDER — METHOCARBAMOL 500 MG PO TABS
500.0000 mg | ORAL_TABLET | Freq: Four times a day (QID) | ORAL | Status: DC
  Administered 2019-11-24 – 2019-11-25 (×4): 500 mg via ORAL
  Filled 2019-11-24 (×4): qty 1

## 2019-11-24 MED ORDER — FOLIC ACID 1 MG PO TABS
1.0000 mg | ORAL_TABLET | Freq: Every day | ORAL | Status: DC
  Administered 2019-11-25: 1 mg via ORAL
  Filled 2019-11-24: qty 1

## 2019-11-24 MED ORDER — POLYETHYLENE GLYCOL 3350 17 G PO PACK: 17.0000 g | PACK | Freq: Every day | ORAL | Status: DC | PRN

## 2019-11-24 MED ORDER — OXYCODONE HCL 5 MG PO TABS
10.0000 mg | ORAL_TABLET | ORAL | Status: DC | PRN
  Administered 2019-11-24: 10 mg via ORAL
  Filled 2019-11-24: qty 2

## 2019-11-24 MED ORDER — FLUTICASONE FUROATE-VILANTEROL 100-25 MCG/INH IN AEPB
2.0000 | INHALATION_SPRAY | Freq: Every day | RESPIRATORY_TRACT | Status: DC
  Administered 2019-11-25: 2 via RESPIRATORY_TRACT
  Filled 2019-11-24: qty 28

## 2019-11-24 MED ORDER — UMECLIDINIUM BROMIDE 62.5 MCG/INH IN AEPB
2.0000 | INHALATION_SPRAY | Freq: Every day | RESPIRATORY_TRACT | Status: DC
  Administered 2019-11-25: 2 via RESPIRATORY_TRACT
  Filled 2019-11-24: qty 7

## 2019-11-24 MED ORDER — LITHIUM CARBONATE 300 MG PO CAPS
300.0000 mg | ORAL_CAPSULE | Freq: Every evening | ORAL | Status: DC
  Administered 2019-11-24: 300 mg via ORAL
  Filled 2019-11-24 (×2): qty 1

## 2019-11-24 MED ORDER — THROMBIN 5000 UNITS EX SOLR
CUTANEOUS | Status: DC | PRN
  Administered 2019-11-24: 5000 [IU] via TOPICAL

## 2019-11-24 MED ORDER — FENTANYL CITRATE (PF) 100 MCG/2ML IJ SOLN
25.0000 ug | INTRAMUSCULAR | Status: AC | PRN
  Administered 2019-11-24 (×4): 25 ug via INTRAVENOUS

## 2019-11-24 MED ORDER — ACETAMINOPHEN 325 MG PO TABS: 650.0000 mg | ORAL_TABLET | ORAL | Status: DC | PRN

## 2019-11-24 MED ORDER — BUPIVACAINE-EPINEPHRINE (PF) 0.5% -1:200000 IJ SOLN
INTRAMUSCULAR | Status: DC | PRN
  Administered 2019-11-24: 10 mL

## 2019-11-24 MED ORDER — EPHEDRINE SULFATE 50 MG/ML IJ SOLN
INTRAMUSCULAR | Status: DC | PRN
  Administered 2019-11-24 (×2): 5 mg via INTRAVENOUS

## 2019-11-24 SURGICAL SUPPLY — 66 items
BLADE BOVIE TIP EXT 4 (BLADE) ×3 IMPLANT
BUR NEURO DRILL SOFT 3.0X3.8M (BURR) ×3 IMPLANT
CANISTER SUCT 1200ML W/VALVE (MISCELLANEOUS) ×3 IMPLANT
CHLORAPREP W/TINT 26 (MISCELLANEOUS) ×3 IMPLANT
CNTNR SPEC 2.5X3XGRAD LEK (MISCELLANEOUS)
CONT SPEC 4OZ STER OR WHT (MISCELLANEOUS)
CONTAINER SPEC 2.5X3XGRAD LEK (MISCELLANEOUS) IMPLANT
COUNTER NEEDLE 20/40 LG (NEEDLE) ×3 IMPLANT
COVER LIGHT HANDLE STERIS (MISCELLANEOUS) ×6 IMPLANT
COVER WAND RF STERILE (DRAPES) ×3 IMPLANT
DERMABOND ADVANCED (GAUZE/BANDAGES/DRESSINGS) ×2
DERMABOND ADVANCED .7 DNX12 (GAUZE/BANDAGES/DRESSINGS) ×1 IMPLANT
DRAPE C-ARM 42X70 (DRAPES) ×6 IMPLANT
DRAPE C-ARM 42X72 X-RAY (DRAPES) ×6 IMPLANT
DRAPE LAPAROTOMY 100X77 ABD (DRAPES) ×3 IMPLANT
DRAPE MICROSCOPE SPINE 48X150 (DRAPES) IMPLANT
DRAPE POUCH INSTRU U-SHP 10X18 (DRAPES) ×3 IMPLANT
DRAPE SURG 17X11 SM STRL (DRAPES) ×3 IMPLANT
DRSG TEGADERM 4X4.75 (GAUZE/BANDAGES/DRESSINGS) ×3 IMPLANT
DRSG TELFA 3X8 NADH (GAUZE/BANDAGES/DRESSINGS) ×3 IMPLANT
DURASEAL APPLICATOR TIP (TIP) IMPLANT
DURASEAL SPINE SEALANT 3ML (MISCELLANEOUS) IMPLANT
ELECT CAUTERY BLADE TIP 2.5 (TIP) ×3
ELECT EZSTD 165MM 6.5IN (MISCELLANEOUS) ×3
ELECT REM PT RETURN 9FT ADLT (ELECTROSURGICAL) ×3
ELECTRODE CAUTERY BLDE TIP 2.5 (TIP) ×1 IMPLANT
ELECTRODE EZSTD 165MM 6.5IN (MISCELLANEOUS) ×1 IMPLANT
ELECTRODE REM PT RTRN 9FT ADLT (ELECTROSURGICAL) ×1 IMPLANT
GAUZE SPONGE 4X4 12PLY STRL (GAUZE/BANDAGES/DRESSINGS) ×3 IMPLANT
GLOVE BIO SURGEON STRL SZ 6.5 (GLOVE) ×2 IMPLANT
GLOVE BIO SURGEONS STRL SZ 6.5 (GLOVE) ×1
GLOVE BIOGEL PI IND STRL 7.0 (GLOVE) ×2 IMPLANT
GLOVE BIOGEL PI IND STRL 8 (GLOVE) ×1 IMPLANT
GLOVE BIOGEL PI INDICATOR 7.0 (GLOVE) ×4
GLOVE BIOGEL PI INDICATOR 8 (GLOVE) ×2
GLOVE INDICATOR 7.0 STRL GRN (GLOVE) ×3 IMPLANT
GLOVE INDICATOR 8.0 STRL GRN (GLOVE) ×3 IMPLANT
GLOVE SURG SYN 7.0 (GLOVE) ×12 IMPLANT
GLOVE SURG SYN 8.0 (GLOVE) ×6 IMPLANT
GOWN STRL REUS W/ TWL LRG LVL3 (GOWN DISPOSABLE) ×1 IMPLANT
GOWN STRL REUS W/ TWL XL LVL3 (GOWN DISPOSABLE) ×1 IMPLANT
GOWN STRL REUS W/TWL LRG LVL3 (GOWN DISPOSABLE) ×2
GOWN STRL REUS W/TWL MED LVL3 (GOWN DISPOSABLE) ×3 IMPLANT
GOWN STRL REUS W/TWL XL LVL3 (GOWN DISPOSABLE) ×2
GRADUATE 1200CC STRL 31836 (MISCELLANEOUS) ×3 IMPLANT
KIT TURNOVER KIT A (KITS) ×3 IMPLANT
KIT WILSON FRAME (KITS) ×3 IMPLANT
MARKER SKIN DUAL TIP RULER LAB (MISCELLANEOUS) ×6 IMPLANT
NDL SAFETY ECLIPSE 18X1.5 (NEEDLE) ×1 IMPLANT
NEEDLE HYPO 18GX1.5 SHARP (NEEDLE) ×2
NEEDLE HYPO 22GX1.5 SAFETY (NEEDLE) ×3 IMPLANT
NS IRRIG 1000ML POUR BTL (IV SOLUTION) ×3 IMPLANT
PACK LAMINECTOMY NEURO (CUSTOM PROCEDURE TRAY) ×3 IMPLANT
PAD ARMBOARD 7.5X6 YLW CONV (MISCELLANEOUS) ×3 IMPLANT
SPOGE SURGIFLO 8M (HEMOSTASIS)
SPONGE SURGIFLO 8M (HEMOSTASIS) IMPLANT
STAPLER SKIN PROX 35W (STAPLE) ×3 IMPLANT
SUT NURALON 4 0 TR CR/8 (SUTURE) IMPLANT
SUT POLYSORB 2-0 5X18 GS-10 (SUTURE) ×15 IMPLANT
SUT VIC AB 0 CT1 18XCR BRD 8 (SUTURE) ×2 IMPLANT
SUT VIC AB 0 CT1 8-18 (SUTURE) ×4
SYR 20ML LL LF (SYRINGE) ×3 IMPLANT
TOWEL OR 17X26 4PK STRL BLUE (TOWEL DISPOSABLE) ×9 IMPLANT
TRAY FOLEY MTR SLVR 16FR STAT (SET/KITS/TRAYS/PACK) ×3 IMPLANT
TUBING CONNECTING 10 (TUBING) ×2 IMPLANT
TUBING CONNECTING 10' (TUBING) ×1

## 2019-11-24 NOTE — H&P (Signed)
Teresa Lang is an 64 y.o. female.   Chief Complaint: Right leg pain HPI: Teresa Lang is here for evaluation of ongoing symptoms of right leg pain that she says started weeks ago. She denies any symptoms like this previously and denies any left leg symptoms. She does state that the pain will be sharp and go down the lateral side of the thigh into the calf. There is some associated numbness. She has been on a steroid taper and taking over-the-counter medications without any significant relief. She has had physical therapy which did not help. She states the pain is debilitating. She does not remember any inciting event. She is using a cane to help with assistance. She had a MRI of the lumbar spine and is here for review   Past Medical History:  Diagnosis Date  . Allergy    seasonal  . Anxiety   . Arthritis   . Asthma   . COPD (chronic obstructive pulmonary disease) ([place])   . Depression   . Dyspnea   . GERD (gastroesophageal reflux disease)   . Hyperlipidemia   . Hypertension     Past Surgical History:  Procedure Laterality Date  . ABDOMINAL HYSTERECTOMY  1983  . CERVICAL DISC SURGERY    . CHOLECYSTECTOMY  1983  . SPINE SURGERY  beginning 1994   laminectomies, fusions    Family History  Problem Relation Age of Onset  . Celiac disease Sister   . Breast cancer Neg Hx    Social History:  reports that she has quit smoking. Her smoking use included cigarettes. She started smoking about 59 years ago. She has a 14.25 pack-year smoking history. She has never used smokeless tobacco. She reports previous drug use. Drug: Marijuana. She reports that she does not drink alcohol.  Allergies:  Allergies  Allergen Reactions  . Prednisone Anxiety    Medications Prior to Admission  Medication Sig Dispense Refill  . albuterol (VENTOLIN HFA) 108 (90 Base) MCG/ACT inhaler Inhale 2 puffs into the lungs every 6 (six) hours as needed for wheezing or shortness of breath. 6.7 g 1  . aspirin  EC 81 MG tablet Take 1 tablet (81 mg total) by mouth daily. 30 tablet 1  . Brexpiprazole (REXULTI) 4 MG TABS Take 4 mg by mouth daily. Rexulti    . diltiazem (CARDIZEM CD) 240 MG 24 hr capsule Take 240 mg by mouth daily.    [name] Kitchen FLUoxetine (PROZAC) 20 MG capsule TAKE 3 CAPSULES BY MOUTH DAILY (Patient taking differently: Take 60 mg by mouth daily. ) 90 capsule 1  . folic acid (FOLVITE) 1 MG tablet Take 1 tablet (1 mg total) by mouth daily. 30 tablet 1  . lisinopril (ZESTRIL) 20 MG tablet Take 1 tablet (20 mg total) by mouth daily. 30 tablet 1  . lithium carbonate 300 MG capsule Take 1 capsule (300 mg total) by mouth at bedtime. (Patient taking differently: Take 300 mg by mouth every evening. ) 30 capsule 1  . methotrexate (RHEUMATREX) 2.5 MG tablet Take 17.5 mg by mouth once a week. Sunday evening    . omeprazole (PRILOSEC) 40 MG capsule Take 40 mg by mouth daily as needed (Reflux).    [name] Kitchen oxyCODONE (OXY IR/ROXICODONE) 5 MG immediate release tablet Take 5 mg by mouth every 8 (eight) hours as needed.    . potassium chloride (KLOR-CON) 10 MEQ tablet Take 10 mEq by mouth daily.    [name] Kitchen tiZANidine (ZANAFLEX) 4 MG tablet Take 4 mg by mouth 2 (two)  times daily as needed for muscle spasms.     . TRELEGY ELLIPTA 100-62.5-25 MCG/INH AEPB Inhale 2 puffs into the lungs daily.    . clonazePAM (KLONOPIN) 1 MG tablet Take 1 tablet (1 mg total) by mouth 2 (two) times daily as needed for up to 30 days for anxiety. 60 tablet 1  . cyclobenzaprine (FLEXERIL) 5 MG tablet Take 1 tablet (5 mg total) by mouth 3 (three) times daily as needed for muscle spasms. (Patient not taking: Reported on 11/12/2019) 30 tablet 0  . diclofenac Sodium (VOLTAREN) 1 % GEL Apply topically 4 (four) times daily.    . fluticasone furoate-vilanterol (BREO ELLIPTA) 100-25 MCG/INH AEPB Inhale 1 puff into the lungs daily. (Patient not taking: Reported on 11/12/2019) 1 each 1  . Multiple Vitamin (MULTIVITAMIN) tablet Take 1 tablet by mouth daily.    [name] Kitchen  olopatadine (PATANOL) 0.1 % ophthalmic solution Place 1 drop into both eyes 2 (two) times daily. (Patient not taking: Reported on 11/12/2019) 5 mL 1  . oxyCODONE-acetaminophen (PERCOCET) 7.5-325 MG tablet Take 1 tablet by mouth every 6 (six) hours as needed for severe pain. (Patient not taking: Reported on 11/12/2019) 20 tablet 0  . pantoprazole (PROTONIX) 40 MG tablet Take 1 tablet (40 mg total) by mouth daily. (Patient not taking: Reported on 11/12/2019) 30 tablet 1  . umeclidinium bromide (INCRUSE ELLIPTA) 62.5 MCG/INH AEPB Inhale 1 puff into the lungs daily. (Patient not taking: Reported on 11/12/2019) 1 each 1    No results found for this or any previous visit (from the past 48 hour(s)). No results found.  Review of Systems neral ROS: Negative Psychological ROS: Negative Ophthalmic ROS: Negative ENT ROS: Negative Hematological and Lymphatic ROS: Negative  Endocrine ROS: Negative Respiratory ROS: Negative Cardiovascular ROS: Negative Gastrointestinal ROS: Negative Genito-Urinary ROS: Negative Musculoskeletal ROS: Positive for back pain Neurological ROS: Positive for right leg pain numbness, weakness Dermatological ROS: Negative  Blood pressure (!) 141/81, pulse 95, temperature 98.8 F (37.1 C), temperature source Tympanic, resp. rate 16, SpO2 96 %. Physical Exam  General appearance: Alert, cooperative, in no acute distress Head: Normocephalic, atraumatic Eyes: Normal, EOM intact Oropharynx: Wearing facemask CV: Regular rate Pulm: Clear to auscultation Back: Tenderness to palpation of the midline and right paramedian region of the lumbar spine Ext: No edema in LE bilaterally  Neurologic exam:  Mental status: alertness: alert, affect: normal Speech: fluent and clear Motor:strength symmetric 5/5 in bilateral lower extremities with the exception of some trace weakness in right dorsiflexion Sensory: intact to light touch in bilateral lower extremities with exception of decreased light  touch over the lateral thigh and calf of the right leg Reflexes: 2+ at left patella, 1+ at right patella Gait: Severely antalgic gait   Imaging: MRI lumbar spine: There is a normal without curvature. The alignment appears well-maintained. There is significant degenerative disease noted throughout the lumbar spine including osteophytes and facet arthropathy. There is severe degeneration of the L4-5 disc space. There is a large synovial cyst/hypertrophied joint on the right at L3-4 which causes severe central stenosis and lateral recess stenosis on the right. There is facet hypertrophy and disc bulging at right L4-5 which also causes severe lateral recess stenosis.  Assessment/Plan 1. Diagnosis: Lumbar stenosis, right L5 4 and 5 radiculopathy  2. Plan -We will start planning for a L3-4 laminectomy and a right L4-5 hemilaminectomy   Lucy Chris, MD 11/24/2019, 6:37 AM

## 2019-11-24 NOTE — Anesthesia Postprocedure Evaluation (Signed)
Anesthesia Post Note  Patient: Teresa Lang  Procedure(s) Performed: OPEN L3/4 LAMINECTOMY WITH CYST REMOVAL (Right )  Patient location during evaluation: PACU Anesthesia Type: General Level of consciousness: awake and alert Pain management: pain level controlled Vital Signs Assessment: post-procedure vital signs reviewed and stable Respiratory status: spontaneous breathing, nonlabored ventilation, respiratory function stable and patient connected to nasal cannula oxygen Cardiovascular status: blood pressure returned to baseline and stable Postop Assessment: no apparent nausea or vomiting Anesthetic complications: no     Last Vitals:  Vitals:   11/24/19 1411 11/24/19 1416  BP: (!) 98/57 112/72  Pulse: 93 97  Resp: 16   Temp:  37.1 C  SpO2: 95%     Last Pain:  Vitals:   11/24/19 1416  TempSrc: Oral  PainSc:                  Lenard Simmer

## 2019-11-24 NOTE — Transfer of Care (Signed)
Immediate Anesthesia Transfer of Care Note  Patient: Teresa Lang  Procedure(s) Performed: OPEN L3/4 LAMINECTOMY WITH CYST REMOVAL (Right )  Patient Location: PACU  Anesthesia Type:General  Level of Consciousness: drowsy  Airway & Oxygen Therapy: Patient Spontanous Breathing and Patient connected to face mask oxygen  Post-op Assessment: Report given to RN  Post vital signs: stable  Last Vitals:  Vitals Value Taken Time  BP 138/79 11/24/19 1149  Temp 37.2 C 11/24/19 1149  Pulse 100 11/24/19 1152  Resp 12 11/24/19 1152  SpO2 100 % 11/24/19 1152  Vitals shown include unvalidated device data.  Last Pain:  Vitals:   11/24/19 0635  TempSrc: Tympanic  PainSc: 8          Complications: No apparent anesthesia complications

## 2019-11-24 NOTE — Anesthesia Preprocedure Evaluation (Signed)
Anesthesia Evaluation  Patient identified by MRN, date of birth, ID band Patient awake    Reviewed: Allergy & Precautions, H&P , NPO status , Patient's Chart, lab work & pertinent test results  History of Anesthesia Complications Negative for: history of anesthetic complications  Airway Mallampati: III  TM Distance: >3 FB Neck ROM: limited    Dental  (+) Chipped, Poor Dentition, Missing, Edentulous Upper   Pulmonary shortness of breath and with exertion, asthma , COPD, former smoker,           Cardiovascular Exercise Tolerance: Good hypertension, (-) angina+CHF       Neuro/Psych PSYCHIATRIC DISORDERS  Neuromuscular disease    GI/Hepatic Neg liver ROS, GERD  Medicated and Controlled,  Endo/Other  negative endocrine ROS  Renal/GU      Musculoskeletal  (+) Arthritis , Fibromyalgia -  Abdominal   Peds  Hematology negative hematology ROS (+)   Anesthesia Other Findings Past Medical History: No date: Allergy     Comment:  seasonal No date: Anxiety No date: Arthritis No date: Asthma No date: COPD (chronic obstructive pulmonary disease) (HCC) No date: Depression No date: Dyspnea No date: GERD (gastroesophageal reflux disease) No date: Hyperlipidemia No date: Hypertension  Past Surgical History: 1983: ABDOMINAL HYSTERECTOMY No date: CERVICAL DISC SURGERY 1983: CHOLECYSTECTOMY beginning 11: SPINE SURGERY     Comment:  laminectomies, fusions     Reproductive/Obstetrics negative OB ROS                             Anesthesia Physical Anesthesia Plan  ASA: III  Anesthesia Plan: General ETT   Post-op Pain Management:    Induction: Intravenous  PONV Risk Score and Plan: Ondansetron, Dexamethasone, Midazolam and Treatment may vary due to age or medical condition  Airway Management Planned: Oral ETT  Additional Equipment:   Intra-op Plan:   Post-operative Plan: Extubation  in OR  Informed Consent: I have reviewed the patients History and Physical, chart, labs and discussed the procedure including the risks, benefits and alternatives for the proposed anesthesia with the patient or authorized representative who has indicated his/her understanding and acceptance.     Dental Advisory Given  Plan Discussed with: Anesthesiologist, CRNA and Surgeon  Anesthesia Plan Comments: (Patient consented for risks of anesthesia including but not limited to:  - adverse reactions to medications - damage to teeth, lips or other oral mucosa - sore throat or hoarseness - Damage to heart, brain, lungs or loss of life  Patient voiced understanding.)        Anesthesia Quick Evaluation

## 2019-11-24 NOTE — Anesthesia Procedure Notes (Signed)
Procedure Name: Intubation Date/Time: 11/24/2019 7:27 AM Performed by: Rodney Booze, CRNA Pre-anesthesia Checklist: Patient identified, Emergency Drugs available, Suction available, Patient being monitored and Timeout performed Patient Re-evaluated:Patient Re-evaluated prior to induction Oxygen Delivery Method: Circle system utilized Preoxygenation: Pre-oxygenation with 100% oxygen Induction Type: IV induction Ventilation: Oral airway inserted - appropriate to patient size Laryngoscope Size: Hyacinth Meeker and 2 Grade View: Grade I Tube type: Oral Tube size: 7.0 mm Number of attempts: 1 Airway Equipment and Method: Stylet Placement Confirmation: ETT inserted through vocal cords under direct vision,  positive ETCO2,  CO2 detector and breath sounds checked- equal and bilateral Secured at: 20 cm Tube secured with: Tape Dental Injury: Teeth and Oropharynx as per pre-operative assessment

## 2019-11-24 NOTE — Interval H&P Note (Signed)
History and Physical Interval Note:  11/24/2019 6:41 AM  [name]  has presented today for surgery, with the diagnosis of lumbar stenosis, lumbar radiculopathy.  The various methods of treatment have been discussed with the patient and family. After consideration of risks, benefits and other options for treatment, the patient has consented to  Procedure(s): OPEN L3/4 LAMINECTOMY, RIGHT L4/5 HEMILAMINECTOMY + DISCECTOMY (Right) as a surgical intervention.  The patient's history has been reviewed, patient examined, no change in status, stable for surgery.  I have reviewed the patient's chart and labs.  Questions were answered to the patient's satisfaction.     Teresa Lang

## 2019-11-24 NOTE — Op Note (Signed)
Operative Note  SURGERY DATE:11/24/2019  PRE-OP DIAGNOSIS: Lumbar Stenosis withLumbar Radiculopathy(m48.062)  POST-OP DIAGNOSIS:Post-Op Diagnosis Codes: Lumbar Stenosis withLumbar Radiculopathy(m48.062) Synovial Cyst  Procedure(s) with comments: L3/4 Laminectomy, Lateral recess decompression, Synovial Cyst resection   SURGEON:  * Nathaniel Man, MD   Ivar Drape, PA, Assistant  ANESTHESIA:General  OPERATIVE FINDINGS: Large right sided Synovial Cystwith Central andLateral Recess Stenosis,Compressionat L3/4  OPERATIVE REPORT:   Indication: Ms. Griffiths presented to the clinic on2/2with ongoing right leg pain.MRI revealed stenosis at L3/4with a large multi-lobed cyst.She had been through PT and steroids without improvement. Lumbardecompression with cyst removal was discussed to relieve symptoms.Therisks of surgery were explained to include hematoma, infection, damage to nerve roots, CSF leak, weakness, numbness, pain, need for future surgery including fusion, heart attack, and stroke. She elected to proceed with surgery for symptom relief.   Procedure The patient was brought to the OR after informed consent was obtained.She was given general anesthesia and intubated by the anesthesia service. Vascular access lines were placed.The patient was then placed prone on a Wilson frameensuring all pressure points were padded. A time-out was performed per protocol.   The patient was sterilely prepped and draped.Amidlineincision wasfound using fluoroscopy andmarked andinstilled withlocal anesthetic with epinephrine. The skin was opened sharply and the dissection taken to the fascia. This was incised and cautery was used to dissect the subperiosteal plane to expose the spinous processes and lamina of L3-4. Retractors were inserted and hemostasis was achieved. X-ray was used to confirm location.  Next, a matchstickdrill bit was used to remove  the entireL3lamina and superiorL4lamina to expose the ligament. There was immediately seen a large discolored cyst on the right after ligament removal. We removed the ligament above and below until normal dura seen. We then went laterally on the left until there was normal dura seen here. Next, a plane was found in the epidural fat layer where we could gently dissect the cyst from the dura starting medially. This stripped without any dural tears but was very adhesive. Once a good portion was freed, the center was decompressed and chronic hematoma was seen to egress. Next, we started to free the cyst from the facet and lateral recess on the right. Once this was done, we continued with trying to remove the cyst wall from multiple directions. We ultimately shaved it down to a small amount along the right lateral border which could not be removed. We ensured no obvious cyst remained along the facets using curettes and rongeurs. The dura was seen to expand as this was performed. The blunt tool waspassedout thelateral recesses bilaterallyand found to be free as well.The bone edges were smooth and no obvious compression remainedcentrally.No CSF was seen.  Once the dura appeared free from all the bone edges, hemostasis was obtained with Floseal and cautery. The wound was irrigated profusely. Thefasciawas then closed using 0 vicryl.Next, multiple subcutaneous and dermal layers were closed with 2-0 vicryl until the epidermis was well approximated. The skin was closed with staples.  The patient was returned to supine position and extubated by the anesthesia service. The patient was then taken to the PACU for post-operative care where she was moving extremities symmetrically.   ESTIMATED BLOOD LOSS: 100cc  SPECIMENS None  IMPLANT None   I performed the case in its entiretywith the assistance of Ivar Drape, [place],  Lucy Chris, MD [phone]

## 2019-11-25 DIAGNOSIS — M48061 Spinal stenosis, lumbar region without neurogenic claudication: Secondary | ICD-10-CM | POA: Diagnosis not present

## 2019-11-25 LAB — SURGICAL PATHOLOGY

## 2019-11-25 MED ORDER — OXYCODONE HCL 5 MG PO TABS: 5.0000 mg | ORAL_TABLET | ORAL | 0 refills | Status: DC | PRN

## 2019-11-25 MED ORDER — METHOCARBAMOL 500 MG PO TABS: 500.0000 mg | ORAL_TABLET | Freq: Four times a day (QID) | ORAL | 0 refills | Status: DC | PRN

## 2019-11-25 NOTE — Progress Notes (Signed)
   11/24/19 1707  Clinical Encounter Type  Visited With Patient  Visit Type Initial  Referral From Nurse  Consult/Referral To Chaplain  Spiritual Encounters  Spiritual Needs Emotional;Other (Comment)  CH completed AD education for patient. Pt shared with Thereasa Parkin that she didn't trust her support system to make decisions for her if she couldn't make them for herself. CH shared that she could complete living will that dictates to nurses/physicians in advance what kind of care she would like to receive and she didn't need a health care agent. CH also shared that completing form in hospital was not possible but she could complete in community and bring to hospital. Pt expressed relief with education. No further needs expressed at this time.

## 2019-11-25 NOTE — TOC Initial Note (Signed)
Transition of Care [facility]) - Initial/Assessment Note    Patient Details  Name: Teresa Lang MRN: [id number] Date of Birth: 31-Aug-1956  Transition of [facility]) CM/SW Contact:    Elease Hashimoto, LCSW Phone Number: 11/25/2019, 10:10 AM  Clinical Narrative:   Met with pt who reports she lives with her sister but her sister is not there much. She is a Mining engineer and has numerous hours. Pt also has two dogs one which has to be taken out on a leach, but she voiced sister is doing this. Pt was surprised how well she did in PT session, but doesn't feel ready to go home today. Made aware MD has written a DC order. She did have [facility] care following her and have contacted them to resume their services. Pt has a standard walker she just got from MD office and a bsc. Bedside RN to call MD to inform of pt's concerns. Will await PT and OT evaluation and work on best plan for pt.             Expected Discharge Plan: [place] Services Barriers to Discharge: Other (comment)(Pt doesn't ready to go today, wants to move better before going)   Patient Goals and CMS Choice Patient states their goals for this hospitalization and ongoing recovery are:: I don't feel ready to go home today I need to be able to move better I have two dogs at home      Expected Discharge Plan and Services Expected Discharge Plan: [place] In-house Referral: Clinical Social Work     Living arrangements for the past 2 months: [place] Expected Discharge Date: 11/25/19                         HH Arranged: RN, PT, OT, Nurse's Aide [place] Agency: [place] Date [place]: 11/25/19 Time [place]: 1008 Representative spoke with at [place]: [place] Arrangements/Services Living arrangements for the past 2 months: [place] with:: Relatives(sister)   Do you feel safe going back to the place where you live?:  Yes      Need for Family Participation in Patient Care: No (Comment) Care giver support system in place?: No (comment) Current home services: DME(has a standard walker and bsc)    Activities of Daily Living Home Assistive Devices/Equipment: Dentures (specify type), Cane (specify quad or straight), Eyeglasses, Walker (specify type), Bedside commode/3-in-1 ADL Screening (condition at time of admission) Patient's cognitive ability adequate to safely complete daily activities?: Yes Is the patient deaf or have difficulty hearing?: No Does the patient have difficulty seeing, even when wearing glasses/contacts?: No Does the patient have difficulty concentrating, remembering, or making decisions?: No Patient able to express need for assistance with ADLs?: Yes Does the patient have difficulty dressing or bathing?: No Independently performs ADLs?: Yes (appropriate for developmental age) Does the patient have difficulty walking or climbing stairs?: Yes Weakness of Legs: Both Weakness of Arms/Hands: Both  Permission Sought/Granted   Permission granted to share information with : Yes, Verbal Permission Granted  Share Information with NAME: Lelan Pons  Permission granted to share info w AGENCY: [facility]        Emotional Assessment Appearance:: Appears stated age Attitude/Demeanor/Rapport: Gracious, Other (comment)(tearful) Affect (typically observed): Accepting, Anxious Orientation: : Oriented to Self, Oriented to Place, Oriented to  Time, Oriented to Situation  Admission diagnosis:  S/P lumbar discectomy [Z98.890] Patient Active Problem List   Diagnosis Date Noted  . S/P lumbar discectomy 11/24/2019  . Alcohol abuse 03/21/2019  . 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 Angina at rest (HCC) 12/08/2018  . MDD (major depressive disorder), recurrent severe, without psychosis (HCC) 11/14/2018  . Anxiety 11/14/2018  . Tobacco use  disorder 10/16/2018  . Chest pain at rest 04/15/2018  . Myofascial pain dysfunction syndrome 03/21/2018  . Cervico-occipital neuralgia of right side 03/21/2018  . Cervical post-laminectomy syndrome 02/18/2018  . Lumbar spondylosis 02/18/2018  . Encounter for long-term (current) use of high-risk medication 02/15/2018  . Chronic upper back pain (Primary Area of Pain) (right) 01/02/2018  . Occipital neuralgia of right side (Secondary Area of Pain) 01/02/2018  . Chronic neck pain (Tertiary Area of Pain)(R) 01/02/2018  . Chronic bilateral low back pain without sciatica (Fourth Area of Pain) 01/02/2018  . Chronic generalized pain 01/02/2018  . Chronic pain syndrome 01/02/2018  . Long term current use of opiate analgesic 01/02/2018  . Pharmacologic therapy 01/02/2018  . Disorder of skeletal system 01/02/2018  . Problems influencing health status 01/02/2018  . PTSD (post-traumatic stress disorder) 12/14/2017  . Fibromyalgia 12/14/2017  . Hyperlipemia, mixed 12/14/2017  . Hypertension, essential 12/14/2017  . Major depressive disorder, recurrent episode, moderate degree (HCC) 12/14/2017  . Pain of cervical spine 12/14/2017  . Rheumatoid arthritis (HCC) 12/14/2017  . Rheumatoid arthritis involving multiple sites with positive rheumatoid factor (HCC) 12/14/2017   PCP:  McLaughlin, Miriam K, MD Pharmacy:   [id number] - [place], [place] - [address] [place] [address] Phone: [phone] Fax: [phone]  [facility] [place], [place] - [address] [place] [address] Phone: [phone] Fax: [phone]     Social Determinants of Health (SDOH) Interventions    Readmission Risk Interventions No flowsheet data found.  

## 2019-11-25 NOTE — Evaluation (Signed)
Physical Therapy Evaluation Patient Details Name: Teresa Lang MRN: [id number] DOB: 17-Jan-1956 Today's Date: 11/25/2019   History of Present Illness  Pt is a 64 yo female s/p L3/4 Laminectomy, Lateral recess decompression, Synovial Cyst resection. PMH of SOB with exertion, asthma, COPD, former smoker, HTN, CHF.    Clinical Impression  Patient alert, on 2L via [place], per RN okay to trial room air. The patient reported 3-4/10 back pain during session, especially with transitional movements. Pt stated that she lives with her sister, previously independent/modI for ADLs, ambulates with SPC, does not drive (sister performs grocery shopping/errands).   The patient was educated on and performed log rolling technique with step by step verbal cueing, supervision. Sit <> stand with CGA/supervision and SPC. The patient ambulated ~141ft with SPC and CGA/supervision. No LOB noted, overall steady, decreased gait velocity. The patient and PT also discussed back precautions, pt verbalized understanding. Pt also reported improved RLE sensation compared to prior to surgery, MMT WFLs (RLE MMT painful).  Overall the patient demonstrated deficits (see "PT Problem List") that impede the patient's functional abilities, safety, and mobility and would benefit from skilled PT intervention. Recommendation is HHPT. Upon discussion of discharge, pt became tearful. Expressed fear of going home for a multitude of reasons. PT provided active listening, encouragement, and education as needed (especially regarding pets). RN notified of pt's status.     Follow Up Recommendations Home health PT    Equipment Recommendations  None recommended by PT    Recommendations for Other Services       Precautions / Restrictions Precautions Precautions: Fall Precaution Comments: back precautions Restrictions Weight Bearing Restrictions: No      Mobility  Bed Mobility Overal bed mobility: Needs Assistance Bed Mobility:  Rolling;Sidelying to Sit Rolling: Supervision Sidelying to sit: Supervision;HOB elevated       General bed mobility comments: educated on log rolling technique  Transfers Overall transfer level: Needs assistance Equipment used: Straight cane Transfers: Sit to/from Stand Sit to Stand: Supervision;Min guard            Ambulation/Gait Ambulation/Gait assistance: Supervision;Min guard Gait Distance (Feet): 100 Feet Assistive device: Straight cane       General Gait Details: no LOB noted, decreased gait velocity, steady  Stairs            Wheelchair Mobility    Modified Rankin (Stroke Patients Only)       Balance Overall balance assessment: Modified Independent                                           Pertinent Vitals/Pain Pain Assessment: 0-10 Pain Score: 4  Pain Location: low back Pain Descriptors / Indicators: Grimacing;Pressure Pain Intervention(s): Limited activity within patient's tolerance;Monitored during session;Premedicated before session;Repositioned    Home Living Family/patient expects to be discharged to:: Private residence Living Arrangements: Other relatives;Other (Comment)(sister) Available Help at Discharge: Family;Available PRN/intermittently;Other (Comment)(sister plans on staying home through the rest of the day) Type of Home: House Home Access: Ramped entrance     Home Layout: One level Home Equipment: Grab bars - tub/shower;Bedside commode;Walker - standard;Cane - single point      Prior Function Level of Independence: Independent         Comments: sister provides driving/errands     Hand Dominance   Dominant Hand: Right    Extremity/Trunk Assessment   Upper Extremity Assessment Upper Extremity  Assessment: Generalized weakness    Lower Extremity Assessment Lower Extremity Assessment: RLE deficits/detail;LLE deficits/detail RLE Deficits / Details: grossly 4/5 except hip flexion 3+/5 and  painful LLE Deficits / Details: grossly 4/5    Cervical / Trunk Assessment Cervical / Trunk Assessment: Normal(s/p lumbar surgery)  Communication   Communication: No difficulties  Cognition Arousal/Alertness: Awake/alert Behavior During Therapy: WFL for tasks assessed/performed Overall Cognitive Status: Within Functional Limits for tasks assessed                                 General Comments: pt emotional about discharge, active listening and encouragement provided, education as indicated as well      General Comments      Exercises Other Exercises Other Exercises: educated on back precautions, pet management upon returning home   Assessment/Plan    PT Assessment Patient needs continued PT services  PT Problem List Decreased strength;Decreased range of motion;Decreased activity tolerance;Decreased balance;Pain       PT Treatment Interventions DME instruction;Therapeutic exercise;Gait training;Balance training;Neuromuscular re-education;Functional mobility training;Patient/family education;Therapeutic activities    PT Goals (Current goals can be found in the Care Plan section)  Acute Rehab PT Goals Patient Stated Goal: to be safe PT Goal Formulation: With patient Time For Goal Achievement: 12/09/19 Potential to Achieve Goals: Good    Frequency 7X/week   Barriers to discharge        Co-evaluation               AM-PAC PT "6 Clicks" Mobility  Outcome Measure Help needed turning from your back to your side while in a flat bed without using bedrails?: None Help needed moving from lying on your back to sitting on the side of a flat bed without using bedrails?: None Help needed moving to and from a bed to a chair (including a wheelchair)?: None Help needed standing up from a chair using your arms (e.g., wheelchair or bedside chair)?: None Help needed to walk in hospital room?: A Little Help needed climbing 3-5 steps with a railing? : A Little 6  Click Score: 22    End of Session Equipment Utilized During Treatment: Gait belt Activity Tolerance: Patient tolerated treatment well Patient left: in chair;with chair alarm set;with call bell/phone within reach Nurse Communication: Mobility status PT Visit Diagnosis: Other abnormalities of gait and mobility (R26.89);Muscle weakness (generalized) (M62.81);Pain Pain - Right/Left: (midline) Pain - part of body: (low back)    Time: 7622-6333 PT Time Calculation (min) (ACUTE ONLY): 35 min   Charges:   PT Evaluation $PT Eval Low Complexity: 1 Low PT Treatments $Therapeutic Exercise: 23-37 mins        Olga Coaster PT, DPT 12:06 PM,11/25/19

## 2019-11-25 NOTE — Discharge Instructions (Signed)
Your surgeon has performed an operation on your lumbar spine (low back) to relieve pressure on one or more nerves. Many times, patients feel better immediately after surgery and can "overdo it." Even if you feel well, it is important that you follow these activity guidelines. If you do not let your back heal properly from the surgery, you can increase the chance of a disc herniation and/or return of your symptoms. The following are instructions to help in your recovery once you have been discharged from the hospital.  * Do not take anti-inflammatory medications for 3 days after surgery (naproxen [Aleve], ibuprofen [Advil, Motrin], celecoxib [Celebrex], etc.)  Activity    No bending, lifting, or twisting ("BLT"). Avoid lifting objects heavier than 10 pounds (gallon milk jug).  Where possible, avoid household activities that involve lifting, bending, pushing, or pulling such as laundry, vacuuming, grocery shopping, and childcare. Try to arrange for help from friends and family for these activities while your back heals.  Increase physical activity slowly as tolerated.  Taking short walks is encouraged, but avoid strenuous exercise. Do not jog, run, bicycle, lift weights, or participate in any other exercises unless specifically allowed by your doctor. Avoid prolonged sitting, including car rides.  Talk to your doctor before resuming sexual activity.  You should not drive until cleared by your doctor.  Until released by your doctor, you should not return to work or school.  You should rest at home and let your body heal.   You may shower two days after your surgery.  After showering, lightly dab your incision dry. Do not take a tub bath or go swimming for 3 weeks, or until approved by your doctor at your follow-up appointment.  If you smoke, we strongly recommend that you quit.  Smoking has been proven to interfere with normal healing in your back and will dramatically reduce the success rate of  your surgery. Please contact [facility] (800-QUIT-NOW) and use the resources at www.[facility].com for assistance in stopping smoking.  Surgical Incision   If you have a dressing on your incision, you may remove it three days after your surgery. Keep your incision area clean and dry.  If you have staples or stitches on your incision, you should have a follow up scheduled for removal. If you do not have staples or stitches, you will have steri-strips (small pieces of surgical tape) or Dermabond glue. The steri-strips/glue should begin to peel away within about a week (it is fine if the steri-strips fall off before then). If the strips are still in place one week after your surgery, you may gently remove them.  Diet            You may return to your usual diet. Be sure to stay hydrated.  When to Contact Us  Although your surgery and recovery will likely be uneventful, you may have some residual numbness, aches, and pains in your back and/or legs. This is normal and should improve in the next few weeks.  However, should you experience any of the following, contact us immediately: . New numbness or weakness . Pain that is progressively getting worse, and is not relieved by your pain medications or rest . Bleeding, redness, swelling, pain, or drainage from surgical incision . Chills or flu-like symptoms . Fever greater than 101.0 F (38.3 C) . Problems with bowel or bladder functions . Difficulty breathing or shortness of breath . Warmth, tenderness, or swelling in your calf  Contact Information . During office hours (Monday-Friday   9 am to 5 pm), please call your physician at [phone] . After hours and weekends, please call [phone] and an answering service will put you in touch with either Dr. Cook or Dr. Yarbrough.  . For a life-threatening emergency, call 911  

## 2019-11-25 NOTE — TOC Transition Note (Signed)
Transition of Care [facility]) - CM/SW Discharge Note   Patient Details  Name: Teresa Lang MRN: [id number] Date of Birth: 11-18-1955  [facility]) CM/SW Contact:  Lucy Chris, LCSW Phone Number: 11/25/2019, 11:53 AM   Clinical Narrative:   MD and PA feel pt is ready for discharge home today.PT reports she did well in PT session. Have contacted [facility] care for follow up and have asked for orders for PT from MD. Pt has all equipment already. Pt is aware and plans to leave later today, will contact sister regarding discharge and she will transport home via personal car. Bedside RN aware of the plan. Pt knows how well she did and feels once home she may calm down and establish a routine. She plans to call sister to see where she is and when she can come and pick her up to take home.    Final next level of care: Home w Home Health Services Barriers to Discharge: Barriers Resolved   Patient Goals and CMS Choice Patient states their goals for this hospitalization and ongoing recovery are:: I don't feel ready to go home today I need to be able to move better I have two dogs at home      Discharge Placement                Patient to be transferred to facility by: Sister to transport home Name of family member notified: sister Patient and family notified of of transfer: 11/25/19  Discharge Plan and Services In-house Referral: Clinical Social Work                 have taken out [facility] aide, RN and OT not needed.       HH Arranged: RN, PT, OT, Nurse's Aide HH Agency: [facility] Date [facility] Agency Contacted: 11/25/19 Time HH Agency Contacted: 1008 Representative spoke with at [facility] Agency: marie  Social Determinants of Health (SDOH) Interventions     Readmission Risk Interventions No flowsheet data found.

## 2019-11-25 NOTE — Discharge Summary (Signed)
Procedure: Open L3/4 laminectomy and cyst removal  Procedure date: 11/24/2019 Diagnosis: lumbar radiculopathy  History: Teresa Lang is s/p L3/4 laminectomy and cyst removal for lumbar radiculopathy  POD1: She is recovering well.  Complains of back pain, 5/10, but lower extremity symptoms have resolved.  States that postop pain medications are adequately controlling pain at this time.  Eating, voiding, and ambulating with walker without issue. Evaluated by PT with recommendations for home PT services.  Physical Exam: Vitals:   11/25/19 0426 11/25/19 0800  BP: 139/73 (!) 157/80  Pulse: 94 90  Resp: 17 16  Temp: 98.5 F (36.9 C) 98.3 F (36.8 C)  SpO2: 95% 97%    General: Alert and oriented Strength:5/5 throughout lower extremities Sensation: intact and symmetric throughout lower extremities Skin: Scant amount of drainage noted on dressing.  Data:  Recent Labs  Lab 11/20/19 0804  NA 140  K 4.2  CL 104  CO2 24  BUN 10  CREATININE 0.84  GLUCOSE 118*  CALCIUM 11.5*   No results for input(s): AST, ALT, ALKPHOS in the last 168 hours.  Invalid input(s): TBILI   Recent Labs  Lab 11/20/19 0804  WBC 7.9  HGB 16.4*  HCT 50.3*  PLT 316   Recent Labs  Lab 11/20/19 0804  APTT 28  INR 1.0         Other tests/results: No imaging reviewed  Assessment/Plan:  Teresa Lang is POD1 s/p L3-4 laminectomy and cyst removal for lumbar radiculopathy.  We will continue postop pain control with Tylenol, muscle relaxer, and pain medication as needed.  Home PT recommended and ordered for d/c. She is scheduled to follow-up in clinic in approximately 2 weeks to monitor progress.  Advised to contact office if any questions or concerns arise before then.  Ivar Drape PA-C Department of Neurosurgery

## 2019-11-25 NOTE — Progress Notes (Signed)
Received MD order to discharge patient to home with home health , reviewed home meds, discharge instructions, prescriptions and follow up appointment with patient and patient verbalized understanding

## 2019-11-25 NOTE — Evaluation (Signed)
Occupational Therapy Evaluation Patient Details Name: Judy Pollman MRN: [id number] DOB: 1956/06/22 Today's Date: 11/25/2019    History of Present Illness Pt is a 64 yo female s/p L3/4 Laminectomy, Lateral recess decompression, Synovial Cyst resection. PMH of SOB with exertion, asthma, COPD, former smoker, HTN, CHF.   Clinical Impression   Patient seen this date for occupational therapy evaluation.  Patient supine in bed, resting, upon entry.  Agreeable to therapy.  Patient's plan is to return home with assistance from family.  Therapist provided education regarding back precautions (BLT) during functional activities, donning/doffing TLSO, and use of adaptive equipment to perform ADLs and adhere to precautions.  Patient verbalized understanding of all education and wanting to be safe when she returned home.  Based on performance, recommending HH OT at discharge for continued treatment regarding adaptive equipment and compensatory techniques.    Follow Up Recommendations  Home health OT    Equipment Recommendations  Other (comment)(reacher)    Recommendations for Other Services       Precautions / Restrictions Precautions Precautions: Fall;Back Precaution Comments: Verbally/visually discussed BLT precautions Required Braces or Orthoses: Other Brace Other Brace: TLSO Restrictions Weight Bearing Restrictions: No      Mobility Bed Mobility Overal bed mobility: Needs Assistance Bed Mobility: Rolling;Sidelying to Sit Rolling: Supervision Sidelying to sit: Supervision;HOB elevated       General bed mobility comments: Educated patient on importance of using log rolling to protect back  Transfers Overall transfer level: Needs assistance Equipment used: Straight cane Transfers: Sit to/from Stand Sit to Stand: Supervision;Min guard              Balance Overall balance assessment: Modified Independent                                         ADL  either performed or assessed with clinical judgement   ADL                                         General ADL Comments: Completed education with patient regarding use of reacher and adaptive equipment to perform LB dressing.  Patient verbalized understanding and stated she has a Sports administrator at home.     Vision Patient Visual Report: No change from baseline       Perception     Praxis      Pertinent Vitals/Pain Pain Assessment: 0-10 Pain Score: 3  Pain Location: low back Pain Descriptors / Indicators: Grimacing;Pressure Pain Intervention(s): Monitored during session     Hand Dominance Right   Extremity/Trunk Assessment Upper Extremity Assessment Upper Extremity Assessment: Generalized weakness   Lower Extremity Assessment Lower Extremity Assessment: Defer to PT evaluation RLE Deficits / Details: grossly 4/5 except hip flexion 3+/5 and painful LLE Deficits / Details: grossly 4/5   Cervical / Trunk Assessment Cervical / Trunk Assessment: Normal(s/p lumbar surgery)   Communication Communication Communication: No difficulties   Cognition Arousal/Alertness: Awake/alert Behavior During Therapy: WFL for tasks assessed/performed Overall Cognitive Status: Within Functional Limits for tasks assessed                                    General Comments       Exercises Other Exercises Other Exercises: educated  on back precautions   Shoulder Instructions      Home Living Family/patient expects to be discharged to:: Private residence Living Arrangements: Other relatives;Other (Comment)(sister) Available Help at Discharge: Family;Available PRN/intermittently;Other (Comment) Type of Home: House Home Access: Ramped entrance     Home Layout: One level     Bathroom Shower/Tub: Teacher, early years/pre: Standard     Home Equipment: Grab bars - tub/shower;Bedside commode;Walker - standard;Cane - single point          Prior  Functioning/Environment Level of Independence: Independent        Comments: sister provides driving/errands        OT Problem List: Pain;Decreased activity tolerance;Other (comment)(education concerning back precautions)      OT Treatment/Interventions: Self-care/ADL training;Therapeutic activities;DME and/or AE instruction;Patient/family education    OT Goals(Current goals can be found in the care plan section) Acute Rehab OT Goals Patient Stated Goal: Do what I can OT Goal Formulation: With patient Time For Goal Achievement: 12/09/19 Potential to Achieve Goals: Good  OT Frequency: Min 2X/week   Barriers to D/C:            Co-evaluation              AM-PAC OT "6 Clicks" Daily Activity     Outcome Measure Help from another person eating meals?: None Help from another person taking care of personal grooming?: None Help from another person toileting, which includes using toliet, bedpan, or urinal?: A Little Help from another person bathing (including washing, rinsing, drying)?: A Little Help from another person to put on and taking off regular upper body clothing?: A Little Help from another person to put on and taking off regular lower body clothing?: A Little 6 Click Score: 20   End of Session Equipment Utilized During Treatment: Other (comment)(TLSO)  Activity Tolerance: Patient tolerated treatment well Patient left: in bed;with call bell/phone within reach;with nursing/sitter in room  OT Visit Diagnosis: Pain;Muscle weakness (generalized) (M62.81)                Time: 9373-4287 OT Time Calculation (min): 11 min Charges:  OT General Charges $OT Visit: 1 Visit OT Evaluation $OT Eval Low Complexity: 1 Low OT Treatments $Self Care/Home Management : 8-22 mins  Baldomero Lamy, MS, OTR/L 11/25/19, 3:41 PM

## 2019-11-25 NOTE — TOC Progression Note (Signed)
Transition of Care [facility]) - Progression Note    Patient Details  Name: Teresa Lang MRN: [id number] Date of Birth: Jul 11, 1956  [facility]) CM/SW Contact  Ellia Knowlton, Lemar Livings, LCSW Phone Number: 11/25/2019, 10:26 AM  Clinical Narrative:   Marval Regal for surgeon to express pt's concerns of not feeling ready to go home today. She will let MD know, but main issue was pt moving and PT reports she is moving well and recommend HHPT. Will get order for home health and ask for aide along with OT, PT. Pt has all needed equipment. Chris-bedside RN aware of plan and will not discharge until MD see's pt.    Expected Discharge Plan: Home w Home Health Services Barriers to Discharge: Other (comment)(Pt doesn't ready to go today, wants to move better before going)  Expected Discharge Plan and Services Expected Discharge Plan: Home w Home Health Services In-house Referral: Clinical Social Work     Living arrangements for the past 2 months: Single Family Home Expected Discharge Date: 11/25/19                         HH Arranged: RN, PT, OT, Nurse's Aide HH Agency: [facility] Date [facility] Agency Contacted: 11/25/19 Time HH Agency Contacted: [phone] Representative spoke with at [facility] Agency: marie   Social Determinants of Health (SDOH) Interventions    Readmission Risk Interventions No flowsheet data found.

## 2020-03-09 ENCOUNTER — Other Ambulatory Visit: Payer: Self-pay | Admitting: Physician Assistant

## 2020-03-09 DIAGNOSIS — Z1231 Encounter for screening mammogram for malignant neoplasm of breast: Secondary | ICD-10-CM

## 2020-03-11 DIAGNOSIS — M19012 Primary osteoarthritis, left shoulder: Secondary | ICD-10-CM | POA: Insufficient documentation

## 2020-03-18 ENCOUNTER — Ambulatory Visit
Admission: RE | Admit: 2020-03-18 | Discharge: 2020-03-18 | Disposition: A | Discharge status: Home / Self Care | Payer: Medicare HMO | Source: Ambulatory Visit | Attending: Physician Assistant | Admitting: Physician Assistant

## 2020-03-18 DIAGNOSIS — Z1231 Encounter for screening mammogram for malignant neoplasm of breast: Secondary | ICD-10-CM | POA: Diagnosis not present

## 2020-03-18 IMAGING — MG DIGITAL SCREENING BILAT W/ TOMO W/ CAD
6 of 12 series · 6 of 36 positions shown · non-contrast
Comparison: Previous exam(s).

CLINICAL DATA: Screening.

EXAM:
DIGITAL SCREENING BILATERAL MAMMOGRAM WITH TOMO AND CAD

[R CC synth-2D]
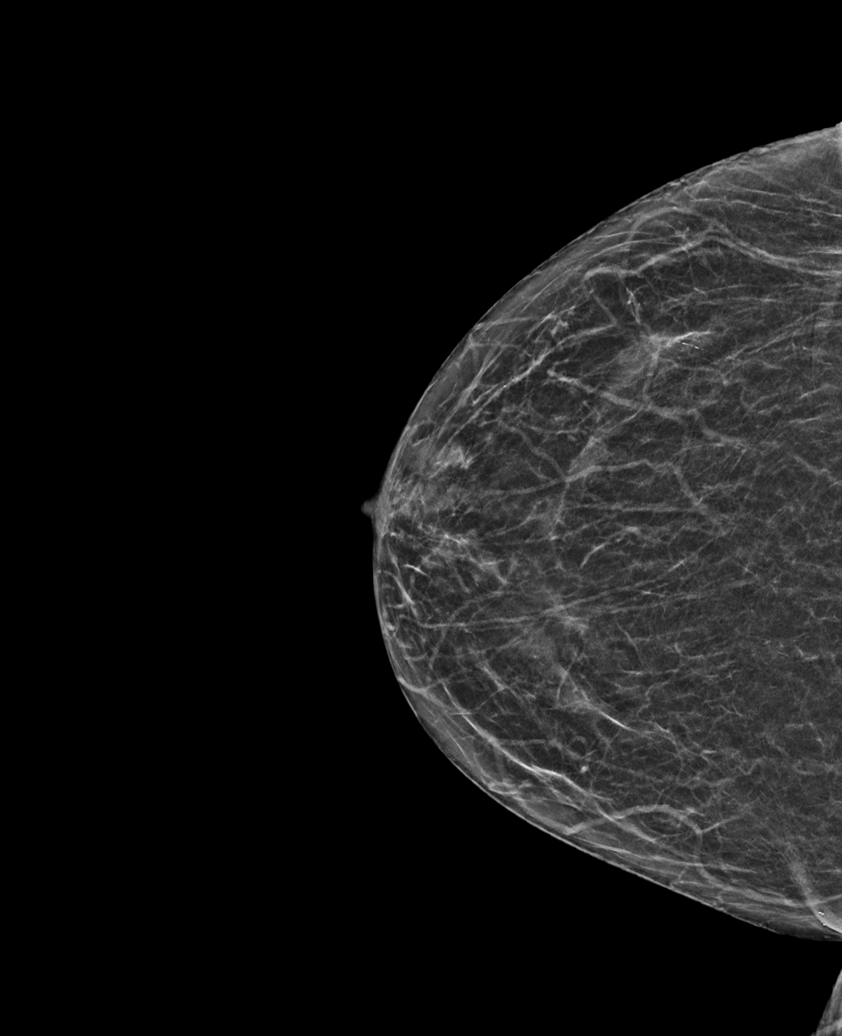

[L MLO synth-2D (1 of 2)]
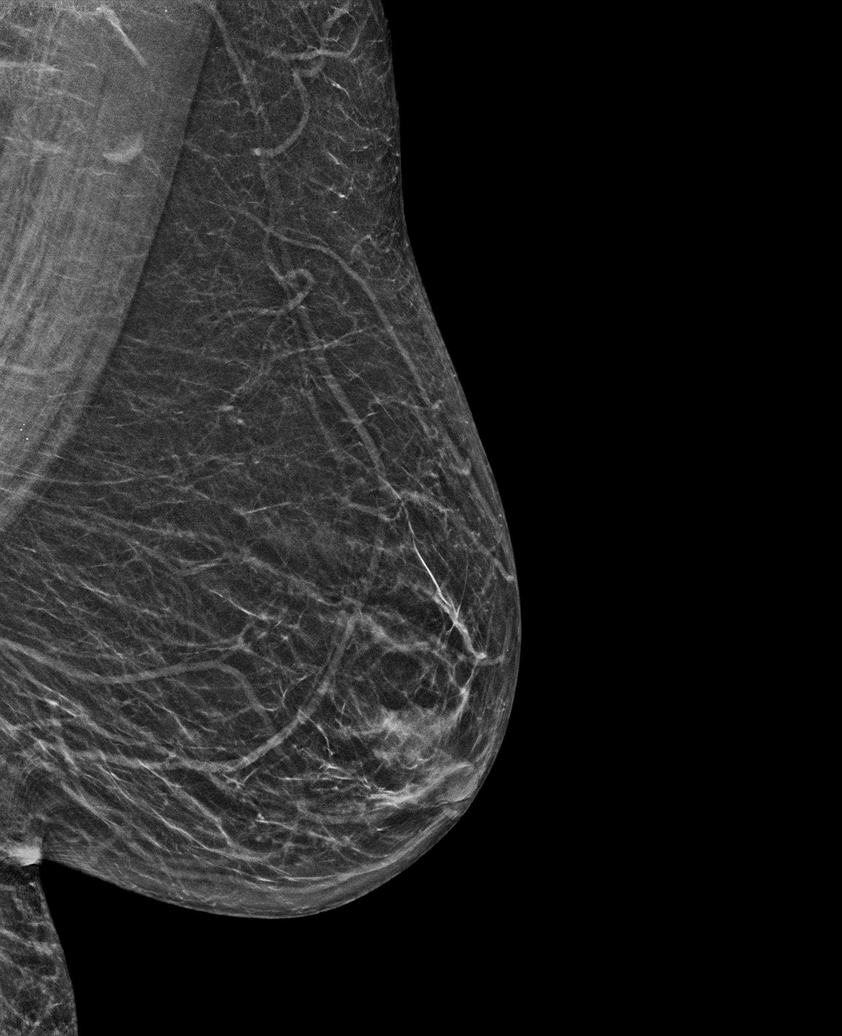

[L CC synth-2D (1 of 2)]
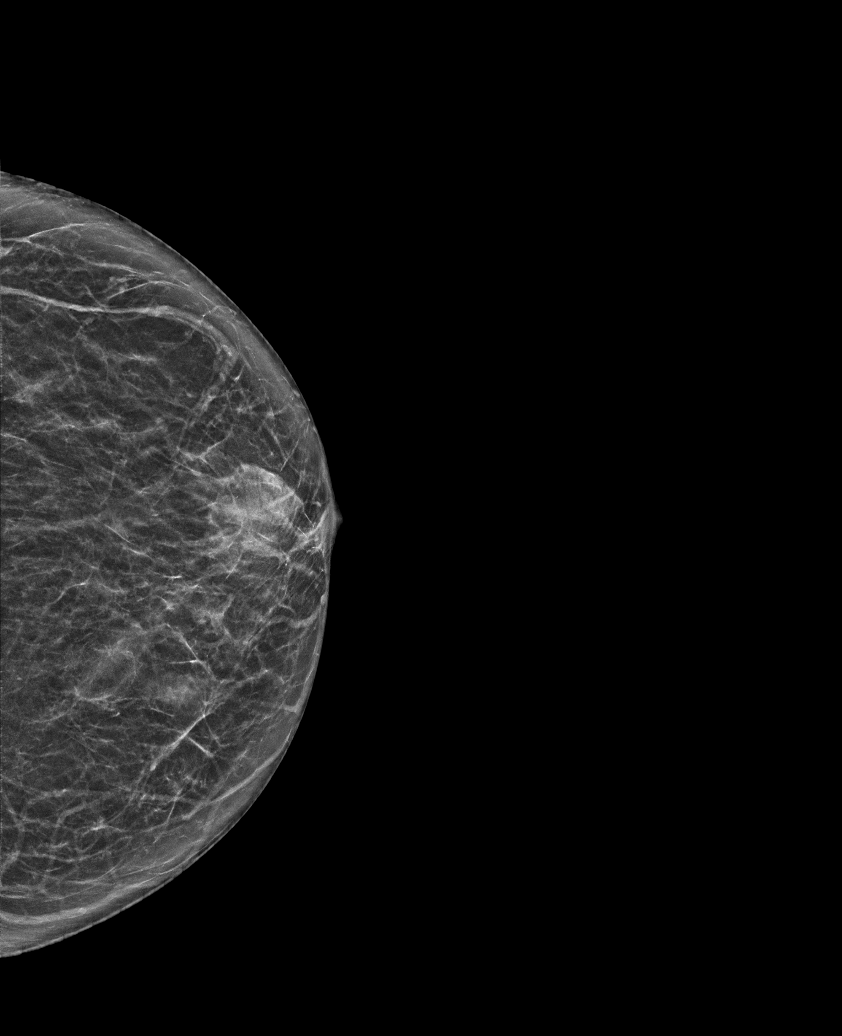

[R MLO synth-2D]
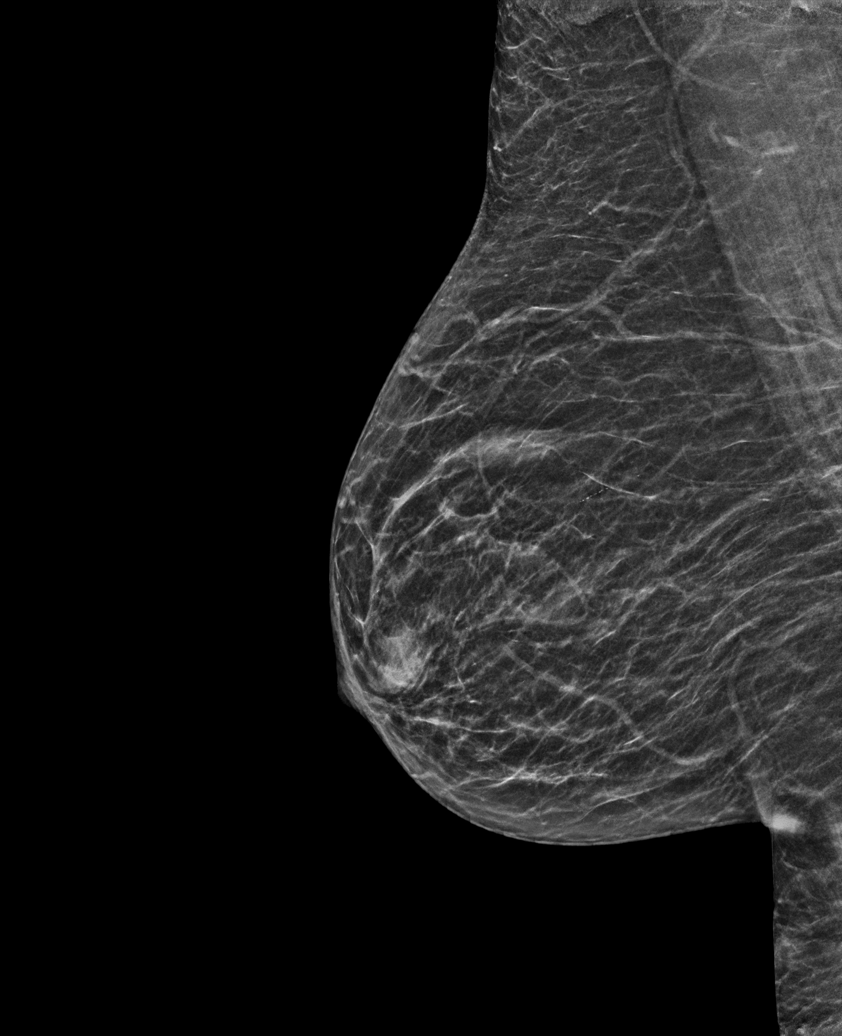

[L MLO synth-2D (2 of 2)]
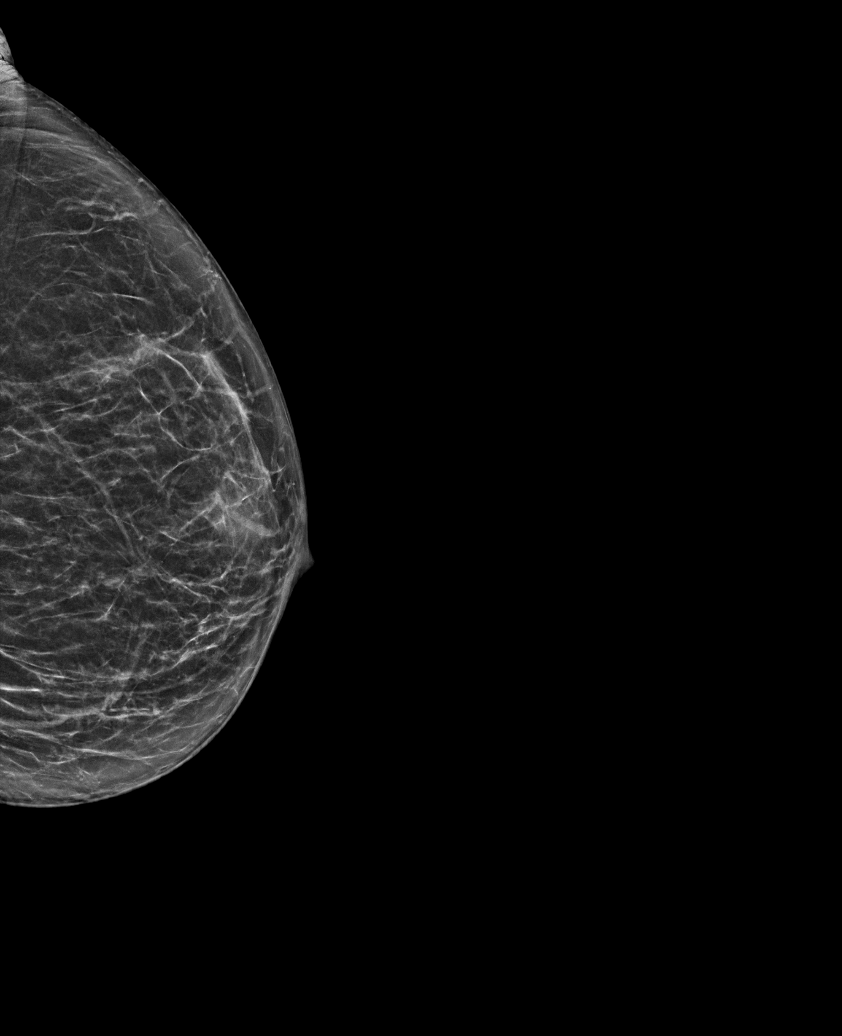

[L CC synth-2D (2 of 2)]
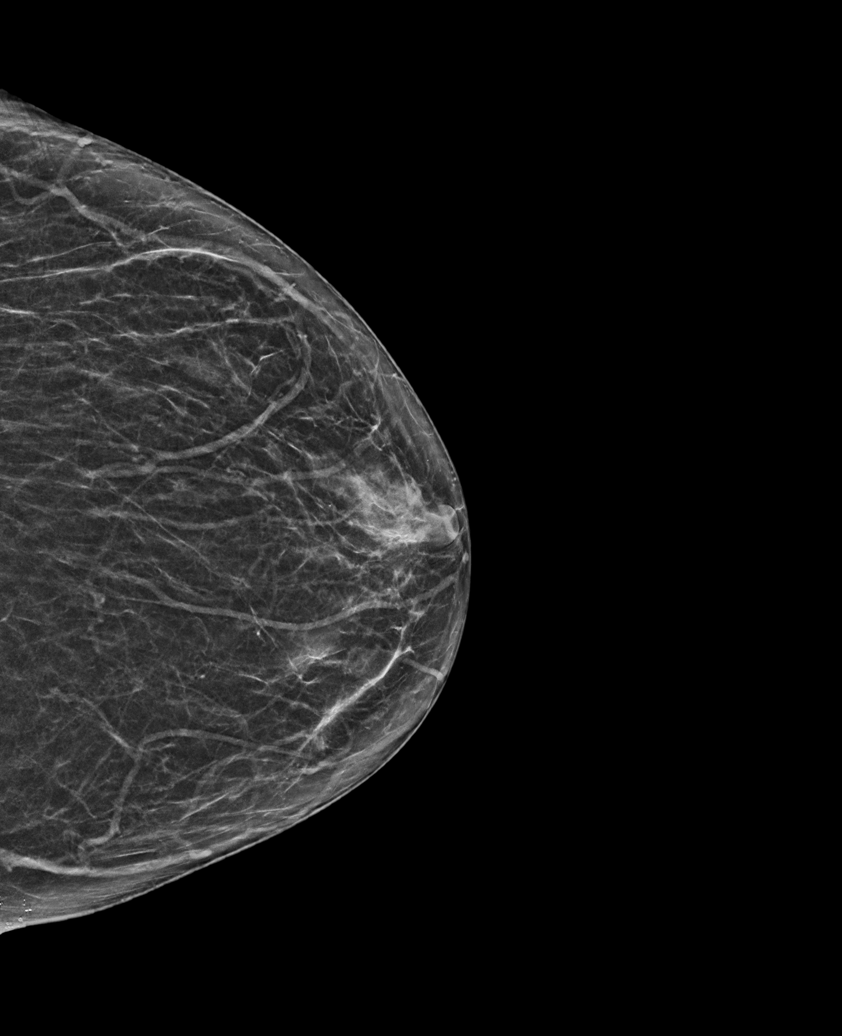

[6 of 36 positions shown; findings below may reference images not displayed]

ACR Breast Density Category b: There are scattered areas of
fibroglandular density.
FINDINGS: There are no findings suspicious for malignancy. Images were
processed with CAD.
IMPRESSION: No mammographic evidence of malignancy. A result letter of this
screening mammogram will be mailed directly to the patient.

RECOMMENDATION:
Screening mammogram in one year. (Code:[TQ])

BI-RADS CATEGORY  1: Negative.

## 2020-04-22 DIAGNOSIS — M8589 Other specified disorders of bone density and structure, multiple sites: Secondary | ICD-10-CM | POA: Diagnosis present

## 2020-05-14 ENCOUNTER — Other Ambulatory Visit: Payer: Self-pay

## 2020-05-14 DIAGNOSIS — Z20822 Contact with and (suspected) exposure to covid-19: Secondary | ICD-10-CM | POA: Diagnosis not present

## 2020-05-14 DIAGNOSIS — J45909 Unspecified asthma, uncomplicated: Secondary | ICD-10-CM | POA: Diagnosis not present

## 2020-05-14 DIAGNOSIS — F331 Major depressive disorder, recurrent, moderate: Secondary | ICD-10-CM | POA: Diagnosis not present

## 2020-05-14 DIAGNOSIS — Z7951 Long term (current) use of inhaled steroids: Secondary | ICD-10-CM | POA: Diagnosis not present

## 2020-05-14 DIAGNOSIS — G894 Chronic pain syndrome: Secondary | ICD-10-CM | POA: Diagnosis not present

## 2020-05-14 DIAGNOSIS — T402X1A Poisoning by other opioids, accidental (unintentional), initial encounter: Secondary | ICD-10-CM | POA: Insufficient documentation

## 2020-05-14 DIAGNOSIS — Z87891 Personal history of nicotine dependence: Secondary | ICD-10-CM | POA: Diagnosis not present

## 2020-05-14 DIAGNOSIS — Z79899 Other long term (current) drug therapy: Secondary | ICD-10-CM | POA: Diagnosis not present

## 2020-05-14 DIAGNOSIS — J449 Chronic obstructive pulmonary disease, unspecified: Secondary | ICD-10-CM | POA: Diagnosis not present

## 2020-05-14 DIAGNOSIS — I10 Essential (primary) hypertension: Secondary | ICD-10-CM | POA: Diagnosis not present

## 2020-05-14 DIAGNOSIS — F332 Major depressive disorder, recurrent severe without psychotic features: Secondary | ICD-10-CM | POA: Diagnosis not present

## 2020-05-14 LAB — COMPREHENSIVE METABOLIC PANEL
ALT: 22 U/L (ref 0–44)
AST: 27 U/L (ref 15–41)
Albumin: 4.4 g/dL (ref 3.5–5.0)
Alkaline Phosphatase: 70 U/L (ref 38–126)
Anion gap: 12 (ref 5–15)
BUN: 18 mg/dL (ref 8–23)
CO2: 24 mmol/L (ref 22–32)
Calcium: 10.9 mg/dL — ABNORMAL HIGH (ref 8.9–10.3)
Chloride: 103 mmol/L (ref 98–111)
Creatinine, Ser: 1.32 mg/dL — ABNORMAL HIGH (ref 0.44–1.00)
GFR calc Af Amer: 49 mL/min — ABNORMAL LOW (ref >60)
GFR calc non Af Amer: 42 mL/min — ABNORMAL LOW (ref >60)
Glucose, Bld: 124 mg/dL — ABNORMAL HIGH (ref 70–99)
Potassium: 4 mmol/L (ref 3.5–5.1)
Sodium: 139 mmol/L (ref 135–145)
Total Bilirubin: 1.1 mg/dL (ref 0.3–1.2)
Total Protein: 7.6 g/dL (ref 6.5–8.1)

## 2020-05-14 LAB — URINE DRUG SCREEN, QUALITATIVE (ARMC ONLY)
Amphetamines, Ur Screen: NOT DETECTED
Barbiturates, Ur Screen: NOT DETECTED
Benzodiazepine, Ur Scrn: NOT DETECTED
Cannabinoid 50 Ng, Ur ~~LOC~~: NOT DETECTED
Cocaine Metabolite,Ur ~~LOC~~: NOT DETECTED
MDMA (Ecstasy)Ur Screen: NOT DETECTED
Methadone Scn, Ur: NOT DETECTED
Opiate, Ur Screen: POSITIVE — AB
Phencyclidine (PCP) Ur S: NOT DETECTED
Tricyclic, Ur Screen: NOT DETECTED

## 2020-05-14 LAB — CBC
HCT: 45.5 % (ref 36.0–46.0)
Hemoglobin: 14.9 g/dL (ref 12.0–15.0)
MCH: 31.4 pg (ref 26.0–34.0)
MCHC: 32.7 g/dL (ref 30.0–36.0)
MCV: 95.8 fL (ref 80.0–100.0)
Platelets: 285 10*3/uL (ref 150–400)
RBC: 4.75 MIL/uL (ref 3.87–5.11)
RDW: 13.7 % (ref 11.5–15.5)
WBC: 7 10*3/uL (ref 4.0–10.5)
nRBC: 0 % (ref 0.0–0.2)

## 2020-05-14 LAB — ETHANOL: Alcohol, Ethyl (B): <10 mg/dL (ref <10)

## 2020-05-14 LAB — LITHIUM LEVEL: Lithium Lvl: 0.24 mmol/L — ABNORMAL LOW (ref 0.60–1.20)

## 2020-05-14 LAB — ACETAMINOPHEN LEVEL: Acetaminophen (Tylenol), Serum: <10 ug/mL — ABNORMAL LOW (ref 10–30)

## 2020-05-14 NOTE — ED Notes (Signed)
Poison control called to check on pt.

## 2020-05-14 NOTE — ED Triage Notes (Signed)
Pt arrives POV for reports of taking 20 5mg  oxycodone around 11 this morning. When this RN asked patient why she took the pills she states, "I thought I needed them". When this RN asks pt if she was trying to kill herself she states no. Pt tearful and shaky during triage. A&Ox4, skin warm and dry, speech clear.

## 2020-05-14 NOTE — ED Triage Notes (Deleted)
Pt arrives via POV for reports of taking 20 5mg  oxycodones around 11 this morning. When this RN asked patient why she took the pills she states, "I thought I needed them". When this RN asks pt if she was trying to kill herself she states no. Pt tearful and shaky during triage. Pt A&Ox4, skin warm and dry, speech clear.

## 2020-05-14 NOTE — ED Notes (Addendum)
Spoke with poison control who states pt needs EKG, basic labs, tylenol level, lithium level. Per poison control pt is beyond the point of needing to be concerned with CNS depression and to treat situation as a suicide attempt. Dr. Derrill Kay informed.

## 2020-05-14 NOTE — ED Notes (Addendum)
Pt dressed out in burgundy scrubs, belongings placed in pt labeled belongings bag  1 pair white tennis shoes  1 pair grey sweat pants 1 pair black socks 1 grey [place] tshirt  1 white bra 1 pair pink panties 1 pink vape pen 1 purple cell phone

## 2020-05-15 ENCOUNTER — Emergency Department
Admission: EM | Admit: 2020-05-15 | Discharge: 2020-05-15 | Disposition: A | Discharge status: Other Acute Inpatient Hospital | Payer: Medicare HMO | Attending: Emergency Medicine | Admitting: Emergency Medicine

## 2020-05-15 DIAGNOSIS — F17200 Nicotine dependence, unspecified, uncomplicated: Secondary | ICD-10-CM | POA: Diagnosis present

## 2020-05-15 DIAGNOSIS — F32A Depression, unspecified: Secondary | ICD-10-CM

## 2020-05-15 DIAGNOSIS — F333 Major depressive disorder, recurrent, severe with psychotic symptoms: Secondary | ICD-10-CM | POA: Diagnosis present

## 2020-05-15 DIAGNOSIS — R52 Pain, unspecified: Secondary | ICD-10-CM | POA: Diagnosis present

## 2020-05-15 DIAGNOSIS — F4310 Post-traumatic stress disorder, unspecified: Secondary | ICD-10-CM | POA: Diagnosis present

## 2020-05-15 DIAGNOSIS — G8929 Other chronic pain: Secondary | ICD-10-CM | POA: Diagnosis present

## 2020-05-15 DIAGNOSIS — F419 Anxiety disorder, unspecified: Secondary | ICD-10-CM | POA: Diagnosis present

## 2020-05-15 DIAGNOSIS — M542 Cervicalgia: Secondary | ICD-10-CM | POA: Diagnosis present

## 2020-05-15 DIAGNOSIS — T40604A Poisoning by unspecified narcotics, undetermined, initial encounter: Secondary | ICD-10-CM

## 2020-05-15 DIAGNOSIS — F332 Major depressive disorder, recurrent severe without psychotic features: Secondary | ICD-10-CM | POA: Diagnosis present

## 2020-05-15 DIAGNOSIS — F331 Major depressive disorder, recurrent, moderate: Secondary | ICD-10-CM | POA: Diagnosis present

## 2020-05-15 LAB — SARS CORONAVIRUS 2 BY RT PCR (HOSPITAL ORDER, PERFORMED IN ~~LOC~~ HOSPITAL LAB): SARS Coronavirus 2: NEGATIVE

## 2020-05-15 MED ORDER — LORAZEPAM 2 MG PO TABS
2.0000 mg | ORAL_TABLET | Freq: Once | ORAL | Status: AC
  Administered 2020-05-15: 2 mg via ORAL
  Filled 2020-05-15: qty 1

## 2020-05-15 MED ORDER — NICOTINE 7 MG/24HR TD PT24
7.0000 mg | MEDICATED_PATCH | Freq: Once | TRANSDERMAL | Status: DC
  Administered 2020-05-15: 7 mg via TRANSDERMAL
  Filled 2020-05-15: qty 1

## 2020-05-15 MED ORDER — LORAZEPAM 0.5 MG PO TABS
0.5000 mg | ORAL_TABLET | Freq: Once | ORAL | Status: AC
  Administered 2020-05-15: 0.5 mg via ORAL
  Filled 2020-05-15: qty 1

## 2020-05-15 NOTE — ED Notes (Signed)
[facility], patient and driver received transfer papers. Patient received belongings and verbalized she has received all of her belongings. Patient appropriate and cooperative, Denies SI/HI AVH. Vital signs taken. NAD noted.

## 2020-05-15 NOTE — BH Assessment (Addendum)
Assessment Note  Teresa Lang is an 64 y.o. female. Per triage note: Pt arrives POV for reports of taking 20 5mg  oxycodone around 11 this morning. When this RN asked patient why she took the pills she states, "I thought I needed them".   Pt was sitting quietly in waiting room upon this writer's arrival. Patient initially had a euthymic mood and affect. The patient was in scrubs and had a disheveled appearance. Patient states that she currently lives with her sister. When asked what brought her to the ED the patient stated, "I guess overdoing my medication. I've been able to keep up with my medication; except for the oxycodone."The patient initially minimized her pill consumption and suggested that she just wanted to sleep. The patient laughed while explaining her sister's reaction to her taking oxycodone. The patient stated, "I guess my sister got pissed off." Patient states that she is estranged from her son and daughter. The patient became tearful while explaining her familial conflict and upon being probed about depression symptoms. The patient reported that she hasn't talked to her children in 3 years and identified this as her source of stress.  As the interview progressed the patient admitted that the pill ingestion was in fact an overdose attempt. The patient reported that she sees a psychiatrist at [facility]. The patient expressed feelings of sadness/loss since her therapist had gotten another job and had recently terminated the therapy relationship. The patient currently denies SI/HI/AV/H. The patient explained that she has had previous inpatient hospitalizations. The patient reported symptoms of depression/anxiety. Patient reported that she has poor sleep and a fair appetite.    Diagnosis: 296.33 Major depressive disorder, Recurrent episode, Severe  Past Medical History:  Past Medical History:  Diagnosis Date  . Allergy    seasonal  . Anxiety   . Arthritis   . Asthma   . COPD (chronic  obstructive pulmonary disease) (HCC)   . Depression   . Dyspnea   . GERD (gastroesophageal reflux disease)   . Hyperlipidemia   . Hypertension     Past Surgical History:  Procedure Laterality Date  . ABDOMINAL HYSTERECTOMY  1983  . CERVICAL DISC SURGERY    . CHOLECYSTECTOMY  1983  . LUMBAR LAMINECTOMY/DECOMPRESSION MICRODISCECTOMY Right 11/24/2019   Procedure: OPEN L3/4 LAMINECTOMY WITH CYST REMOVAL;  Surgeon: Lucy Chris, MD;  Location: [facility] ORS;  Service: Neurosurgery;  Laterality: Right;  . SPINE SURGERY  beginning 1994   laminectomies, fusions    Family History:  Family History  Problem Relation Age of Onset  . Celiac disease Sister   . Breast cancer Neg Hx     Social History:  reports that she has quit smoking. Her smoking use included cigarettes. She started smoking about 59 years ago. She has a 14.25 pack-year smoking history. She has never used smokeless tobacco. She reports previous drug use. Drug: Marijuana. She reports that she does not drink alcohol.  Additional Social History:  Alcohol / Drug Use Pain Medications: See PTA Prescriptions: See PTA History of alcohol / drug use?: No history of alcohol / drug abuse  CIWA: CIWA-Ar BP: 112/83 Pulse Rate: 80 COWS:    Allergies:  Allergies  Allergen Reactions  . Prednisone Anxiety    Home Medications: (Not in a hospital admission)   OB/GYN Status:  No LMP recorded. Patient has had a hysterectomy.  General Assessment Data Location of Assessment: [facility] ED TTS Assessment: In system Is this a Tele or Face-to-Face Assessment?: Face-to-Face Is this an Initial  Assessment or a Re-assessment for this encounter?: Initial Assessment Patient Accompanied by:: N/A Language Other than English: No Living Arrangements: Other (Comment) What gender do you identify as?: Female Date Telepsych consult ordered in [facility]: 05/15/20 Marital status: Single Maiden name: N/A Pregnancy Status: Unknown Living Arrangements: Other  relatives, Other (Comment) Can pt return to current living arrangement?: Yes Admission Status: Voluntary Is patient capable of signing voluntary admission?: Yes Referral Source: Self/Family/Friend Insurance type: Youth worker Exam [facility] Walk-in ONLY) Medical Exam completed: Yes  Crisis Care Plan Living Arrangements: Other relatives, Other (Comment) Legal Guardian: Other: (Self) Name of Psychiatrist: [facility] Name of Therapist: [facility] (Ended relationship with therapist on 05-14-20)  Education Status Is patient currently in school?: No Is the patient employed, unemployed or receiving disability?: Unemployed  Risk to self with the past 6 months Suicidal Ideation: No Has patient been a risk to self within the past 6 months prior to admission? : Yes Suicidal Intent: No Has patient had any suicidal intent within the past 6 months prior to admission? : Yes Is patient at risk for suicide?: Yes Suicidal Plan?: No Has patient had any suicidal plan within the past 6 months prior to admission? : Yes Access to Means: Yes Specify Access to Suicidal Means: Patient overdosed on prescription medications What has been your use of drugs/alcohol within the last 12 months?: n/a Previous Attempts/Gestures: Yes Other Self Harm Risks: Worsening Depression Triggers for Past Attempts: None known Intentional Self Injurious Behavior:  (Attempted OD) Family Suicide History: Unknown Recent stressful life event(s): Conflict (Comment) Persecutory voices/beliefs?: No Depression: Yes Depression Symptoms: Tearfulness, Feeling worthless/self pity Substance abuse history and/or treatment for substance abuse?: No Suicide prevention information given to non-admitted patients: Not applicable  Risk to Others within the past 6 months Homicidal Ideation: No Does patient have any lifetime risk of violence toward others beyond the six months prior to admission? : No Thoughts of Harm to Others: No Current  Homicidal Intent: No Current Homicidal Plan: No Access to Homicidal Means: No Identified Victim: n/a History of harm to others?: No Assessment of Violence: None Noted Violent Behavior Description: n/a Does patient have access to weapons?: No Criminal Charges Pending?: No Does patient have a court date: No Is patient on probation?: No  Psychosis Hallucinations: None noted Delusions: None noted  Mental Status Report Appearance/Hygiene: Poor hygiene, In scrubs Eye Contact: Poor Motor Activity: Unsteady Speech: Unremarkable Level of Consciousness: Alert Mood: Depressed, Anxious Affect: Anxious, Depressed Anxiety Level: Moderate Thought Processes: Relevant, Coherent Judgement: Impaired Orientation: Person, Place, Time, Situation Obsessive Compulsive Thoughts/Behaviors: None  Cognitive Functioning Concentration: Good Memory: Recent Intact, Remote Intact Is patient IDD: No Insight: Poor Impulse Control: Poor Appetite: Fair Have you had any weight changes? : No Change Sleep: Decreased Vegetative Symptoms: None  ADLScreening [facility]) Patient's cognitive ability adequate to safely complete daily activities?: Yes Patient able to express need for assistance with ADLs?: Yes Independently performs ADLs?: Yes (appropriate for developmental age)  Prior Inpatient Therapy Prior Inpatient Therapy: Yes Prior Therapy Dates: 03/20/19 Prior Therapy Facilty/Provider(s): [facility] Reason for Treatment: Depression  Prior Outpatient Therapy Prior Outpatient Therapy: Yes Prior Therapy Facilty/Provider(s): [facility] Reason for Treatment: Depression Does patient have an ACCT team?: No Does patient have Intensive In-House Services?  : No Does patient have [facility] services? : No Does patient have P4CC services?: No  ADL Screening (condition at time of admission) Patient's cognitive ability adequate to safely complete daily activities?: Yes Is the patient deaf or have difficulty  hearing?: No Does the patient have difficulty seeing, even when wearing glasses/contacts?: No Does the patient have difficulty concentrating, remembering, or making decisions?: No Patient able to express need for assistance with ADLs?: Yes Does the patient have difficulty dressing or bathing?: No Independently performs ADLs?: Yes (appropriate for developmental age) Does the patient have difficulty walking or climbing stairs?: No Weakness of Legs: None Weakness of Arms/Hands: None  Home Assistive Devices/Equipment Home Assistive Devices/Equipment: None  Therapy Consults (therapy consults require a physician order) PT Evaluation Needed: No OT Evalulation Needed: No SLP Evaluation Needed: No   Values / Beliefs Cultural Requests During Hospitalization: None Spiritual Requests During Hospitalization: None Consults Spiritual Care Consult Needed: No Transition of Care Team Consult Needed: No Advance Directives (For Healthcare) Does Patient Have a Medical Advance Directive?: No          Disposition: Per Annice Pih, NP patient is recommended for inpatient treatment.  Disposition Initial Assessment Completed for this Encounter: Yes  On Site Evaluation by:   Reviewed with Physician:    Foy Guadalajara 05/15/2020 4:24 AM

## 2020-05-15 NOTE — ED Notes (Signed)
Patient gave her sister French Ana her cell phone and her vape pen to take home

## 2020-05-15 NOTE — ED Notes (Signed)
Patient asked why she came to the hospital today by this RN. Patient reports that she took over 5 of her pain pills ("oxy"). patient asked if she was trying to hurt herself; patient denied SI. Patient stated: "I was just trying to get some rest". Patient reports she is on the medication for chronic pain.

## 2020-05-15 NOTE — BH Assessment (Signed)
Late Entry- Writer spoke with the patient to complete an updated/reassessment. Patient reports of having increase symptoms of depression. She also reports of having a lack of sleep. While sharing this, patient became tearful, anxious and started to have a panic attack. Writer was able to help patient work through it by using her coping skill of deep breathing and practicing other mindful exercisers. She reported she was starting to have an increase in chest pains and was concerned because it felt similar to when she had a heart attack in the past. Writer informed ER MD (Dr. Michiel Sites).   Writer checked with patient throughout the morning and she reported she was doing well. When her sister visited, Clinical research associate provided both the patient and the sister information for [facility], as well as contact information.

## 2020-05-15 NOTE — ED Notes (Signed)
Hourly rounding reveals patient laying in hallway bed with eyes open. No complaints, stable, in no acute distress. Q15 minute rounds and monitoring via [facility]over to continue.

## 2020-05-15 NOTE — BH Assessment (Addendum)
Referral information for Psychiatric Hospitalization faxed to;    [facility] [phone] -[phone]) [phone]   [facility] [phone] -or- [phone]),    [facility] [phone] or [phone]) Lanito reported patient is being staffed and will call back.   [facility] [phone] or [phone]),   [facility] [phone]),   [facility] [phone]),   [facility] [phone] or [phone])  [facility] [phone] or [phone]),   [facility] [phone]).

## 2020-05-15 NOTE — ED Provider Notes (Signed)
[facility] Emergency Department Provider Note   ____________________________________________   First MD Initiated Contact with Patient 05/15/20 [phone]     (approximate)  I have reviewed the triage vital signs and the nursing notes.   HISTORY  Chief Complaint Drug Overdose    HPI Teresa Lang is a 64 y.o. female who presents to the ED status post oxycodone overdose.  Patient reports taking 25 mg oxycodone around 11 AM.  She told the triage nurse "I thought I needed them".  Patient denies SI although she is tearful.  Denies HI/AH/VH.  Voices no medical complaints.     Past Medical History:  Diagnosis Date  . Allergy    seasonal  . Anxiety   . Arthritis   . Asthma   . COPD (chronic obstructive pulmonary disease) (HCC)   . Depression   . Dyspnea   . GERD (gastroesophageal reflux disease)   . Hyperlipidemia   . Hypertension     Patient Active Problem List   Diagnosis Date Noted  . S/P lumbar discectomy 11/24/2019  . Alcohol abuse 03/21/2019  . 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 Angina at rest [facility] 12/08/2018  . MDD (major depressive disorder), recurrent severe, without psychosis (HCC) 11/14/2018  . Anxiety 11/14/2018  . Tobacco use disorder 10/16/2018  . Chest pain at rest 04/15/2018  . Myofascial pain dysfunction syndrome 03/21/2018  . Cervico-occipital neuralgia of right side 03/21/2018  . Cervical post-laminectomy syndrome 02/18/2018  . Lumbar spondylosis 02/18/2018  . Encounter for long-term (current) use of high-risk medication 02/15/2018  . Chronic upper back pain (Primary Area of Pain) (right) 01/02/2018  . Occipital neuralgia of right side (Secondary Area of Pain) 01/02/2018  . Chronic neck pain (Tertiary Area of Pain)(R) 01/02/2018  . Chronic bilateral low back pain without sciatica (Fourth Area of Pain) 01/02/2018  . Chronic generalized pain 01/02/2018  . Chronic pain  syndrome 01/02/2018  . Long term current use of opiate analgesic 01/02/2018  . Pharmacologic therapy 01/02/2018  . Disorder of skeletal system 01/02/2018  . Problems influencing health status 01/02/2018  . PTSD (post-traumatic stress disorder) 12/14/2017  . Fibromyalgia 12/14/2017  . Hyperlipemia, mixed 12/14/2017  . Hypertension, essential 12/14/2017  . Major depressive disorder, recurrent episode, moderate degree (HCC) 12/14/2017  . Pain of cervical spine 12/14/2017  . Rheumatoid arthritis (HCC) 12/14/2017  . Rheumatoid arthritis involving multiple sites with positive rheumatoid factor (HCC) 12/14/2017    Past Surgical History:  Procedure Laterality Date  . ABDOMINAL HYSTERECTOMY  1983  . CERVICAL DISC SURGERY    . CHOLECYSTECTOMY  1983  . LUMBAR LAMINECTOMY/DECOMPRESSION MICRODISCECTOMY Right 11/24/2019   Procedure: OPEN L3/4 LAMINECTOMY WITH CYST REMOVAL;  Surgeon: Lucy Chris, MD;  Location: [facility] ORS;  Service: Neurosurgery;  Laterality: Right;  . SPINE SURGERY  beginning 1994   laminectomies, fusions    Prior to Admission medications   Medication Sig Start Date End Date Taking? Authorizing Provider  albuterol (VENTOLIN HFA) 108 (90 Base) MCG/ACT inhaler Inhale 2 puffs into the lungs every 6 (six) hours as needed for wheezing or shortness of breath. 03/27/19   Clapacs, Jackquline Denmark, MD  Brexpiprazole (REXULTI) 4 MG TABS Take 4 mg by mouth daily. Rexulti    [provider]  clonazePAM (KLONOPIN) 1 MG tablet Take 1 tablet (1 mg total) by mouth 2 (two) times daily as needed for up to 30 days for anxiety. 03/27/19 04/26/19  Clapacs, Jackquline Denmark, MD  diclofenac Sodium (  VOLTAREN) 1 % GEL Apply topically 4 (four) times daily.    [provider]  diltiazem (CARDIZEM CD) 240 MG 24 hr capsule Take 240 mg by mouth daily. 08/21/19   [provider]  FLUoxetine (PROZAC) 20 MG capsule TAKE 3 CAPSULES BY MOUTH DAILY Patient taking differently: Take 60 mg by mouth daily.  04/18/19    Clapacs, Jackquline Denmark, MD  fluticasone furoate-vilanterol (BREO ELLIPTA) 100-25 MCG/INH AEPB Inhale 1 puff into the lungs daily. Patient not taking: Reported on 11/12/2019 03/27/19   Clapacs, Jackquline Denmark, MD  folic acid (FOLVITE) 1 MG tablet Take 1 tablet (1 mg total) by mouth daily. 03/27/19   Clapacs, Jackquline Denmark, MD  lisinopril (ZESTRIL) 20 MG tablet Take 1 tablet (20 mg total) by mouth daily. 03/27/19   Clapacs, Jackquline Denmark, MD  lithium carbonate 300 MG capsule Take 1 capsule (300 mg total) by mouth at bedtime. Patient taking differently: Take 300 mg by mouth every evening.  03/27/19   Clapacs, Jackquline Denmark, MD  methocarbamol (ROBAXIN) 500 MG tablet Take 1 tablet (500 mg total) by mouth every 6 (six) hours as needed for muscle spasms. 11/25/19   Ivar Drape, PA-C  methotrexate (RHEUMATREX) 2.5 MG tablet Take 17.5 mg by mouth once a week. Sunday evening 10/16/18   [provider]  Multiple Vitamin (MULTIVITAMIN) tablet Take 1 tablet by mouth daily.    [provider]  olopatadine (PATANOL) 0.1 % ophthalmic solution Place 1 drop into both eyes 2 (two) times daily. Patient not taking: Reported on 11/12/2019 03/27/19   Clapacs, Jackquline Denmark, MD  omeprazole (PRILOSEC) 40 MG capsule Take 40 mg by mouth daily as needed (Reflux).    [provider]  oxyCODONE (OXY IR/ROXICODONE) 5 MG immediate release tablet Take 5 mg by mouth every 8 (eight) hours as needed. 10/29/19   [provider]  oxyCODONE (OXY IR/ROXICODONE) 5 MG immediate release tablet Take 1 tablet (5 mg total) by mouth every 3 (three) hours as needed for moderate pain ((score 4 to 6)). 11/25/19   Ivar Drape, PA-C  pantoprazole (PROTONIX) 40 MG tablet Take 1 tablet (40 mg total) by mouth daily. Patient not taking: Reported on 11/12/2019 03/27/19   Clapacs, Jackquline Denmark, MD  potassium chloride (KLOR-CON) 10 MEQ tablet Take 10 mEq by mouth daily. 11/07/19   [provider]  TRELEGY ELLIPTA 100-62.5-25 MCG/INH AEPB Inhale 2 puffs into the lungs  daily. 05/22/19   [provider]  umeclidinium bromide (INCRUSE ELLIPTA) 62.5 MCG/INH AEPB Inhale 1 puff into the lungs daily. Patient not taking: Reported on 11/12/2019 03/27/19   Clapacs, Jackquline Denmark, MD    Allergies Prednisone  Family History  Problem Relation Age of Onset  . Celiac disease Sister   . Breast cancer Neg Hx     Social History Social History   Tobacco Use  . Smoking status: Former Smoker    Packs/day: 0.25    Years: 57.00    Pack years: 14.25    Types: Cigarettes    Start date: 1962  . Smokeless tobacco: Never Used  . Tobacco comment: Also VAPES on a daily basis  Vaping Use  . Vaping Use: Every day  Substance Use Topics  . Alcohol use: Never  . Drug use: Not Currently    Types: Marijuana    Review of Systems  Constitutional: Positive for opiate overdose.  No fever/chills Eyes: No visual changes. ENT: No sore throat. Cardiovascular: Denies chest pain. Respiratory: Denies shortness of breath. Gastrointestinal: No  abdominal pain.  No nausea, no vomiting.  No diarrhea.  No constipation. Genitourinary: Negative for dysuria. Musculoskeletal: Negative for back pain. Skin: Negative for rash. Neurological: Negative for headaches, focal weakness or numbness.   ____________________________________________   PHYSICAL EXAM:  VITAL SIGNS: ED Triage Vitals  Enc Vitals Group     BP 05/14/20 1733 (!) 120/92     Pulse Rate 05/14/20 1733 97     Resp 05/14/20 1733 18     Temp 05/14/20 1733 98.7 F (37.1 C)     Temp Source 05/14/20 1733 Oral     SpO2 05/14/20 1733 93 %     Weight 05/14/20 1739 168 lb (76.2 kg)     Height 05/14/20 1739 5\' 6"  (1.676 m)     Head Circumference --      Peak Flow --      Pain Score 05/14/20 1739 0     Pain Loc --      Pain Edu? --      Excl. in GC? --     Constitutional: Alert and oriented.  Disheveled appearing and in no acute distress. Eyes: Conjunctivae are normal. PERRL. EOMI. Head: Atraumatic. Nose: No  congestion/rhinnorhea. Mouth/Throat: Mucous membranes are mildly dry.   Neck: No stridor.   Cardiovascular: Normal rate, regular rhythm. Grossly normal heart sounds.  Good peripheral circulation. Respiratory: Normal respiratory effort.  No retractions. Lungs CTAB. Gastrointestinal: Soft and nontender to light or deep palpation. No distention. No abdominal bruits. No CVA tenderness. Musculoskeletal: No lower extremity tenderness nor edema.  No joint effusions. Neurologic:  Normal speech and language. No gross focal neurologic deficits are appreciated. No gait instability. Skin:  Skin is warm, dry and intact. No rash noted. Psychiatric: Mood and affect are tearful. Speech and behavior are normal.  ____________________________________________   LABS (all labs ordered are listed, but only abnormal results are displayed)  Labs Reviewed  COMPREHENSIVE METABOLIC PANEL - Abnormal; Notable for the following components:      Result Value   Glucose, Bld 124 (*)    Creatinine, Ser 1.32 (*)    Calcium 10.9 (*)    GFR calc non Af Amer 42 (*)    GFR calc Af Amer 49 (*)    All other components within normal limits  URINE DRUG SCREEN, QUALITATIVE ([facility] ONLY) - Abnormal; Notable for the following components:   Opiate, Ur Screen POSITIVE (*)    All other components within normal limits  ACETAMINOPHEN LEVEL - Abnormal; Notable for the following components:   Acetaminophen (Tylenol), Serum <10 (*)    All other components within normal limits  LITHIUM LEVEL - Abnormal; Notable for the following components:   Lithium Lvl 0.24 (*)    All other components within normal limits  SARS CORONAVIRUS 2 BY RT PCR (HOSPITAL ORDER, PERFORMED IN [facility])  ETHANOL  CBC   ____________________________________________  EKG  ED ECG REPORT I, Aria Jarrard J, the attending physician, personally viewed and interpreted this ECG.   Date: 05/15/2020  EKG Time: 1754  Rate: 88  Rhythm: normal EKG,  normal sinus rhythm  Axis: Normal  Intervals:none  ST&T Change: Nonspecific  ____________________________________________  RADIOLOGY  ED MD interpretation: None  Official radiology report(s): No results found.  ____________________________________________   PROCEDURES  Procedure(s) performed (including Critical Care):  Procedures   ____________________________________________   INITIAL IMPRESSION / ASSESSMENT AND PLAN / ED COURSE  As part of my medical decision making, I reviewed the following data within the electronic MEDICAL RECORD NUMBER  Nursing notes reviewed and incorporated, Labs reviewed, EKG interpreted, Old chart reviewed, A consult was requested and obtained from this/these consultant(s) Psychiatry and Notes from prior ED visits     Cloma Rahrig was evaluated in Emergency Department on 05/15/2020 for the symptoms described in the history of present illness. She was evaluated in the context of the global COVID-19 pandemic, which necessitated consideration that the patient might be at risk for infection with the SARS-CoV-2 virus that causes COVID-19. Institutional protocols and algorithms that pertain to the evaluation of patients at risk for COVID-19 are in a state of rapid change based on information released by regulatory bodies including the CDC and federal and state organizations. These policies and algorithms were followed during the patient's care in the ED.    64 year old female who presents status post opiate overdose.  Laboratory results demonstrate mild AKI; patient has been hydrating orally.  Awaiting psychiatric evaluation.   Clinical Course as of May 15 709  Sat May 15, 2020  0249 Patient seen by psychiatric NP who recommends admission. The patient has been placed in psychiatric observation due to the need to provide a safe environment for the patient while obtaining psychiatric consultation and evaluation, as well as ongoing medical and medication  management to treat the patient's condition. The patient has not been placed under full IVC at this time.     [JS]    Clinical Course User Index [JS] Irean Hong, MD     ____________________________________________   FINAL CLINICAL IMPRESSION(S) / ED DIAGNOSES  Final diagnoses:  Opiate overdose, undetermined intent, initial encounter [facility])  Depression, unspecified depression type     ED Discharge Orders    None       Note:  This document was prepared using Dragon voice recognition software and may include unintentional dictation errors.   Irean Hong, MD 05/15/20 [phone]

## 2020-05-15 NOTE — BH Assessment (Signed)
Patient has been accepted to [facility].  Patient assigned to the 900 unit Accepting physician is Dr. Missy Sabins.  Call report to [phone].  Representative was [employer].   ER Staff is aware of it:  Ronnie, ER Sharol Given, Patient's Nurse   Bed will be available after 7pm.  Address: [address]  [place], [place] [address]

## 2020-05-15 NOTE — ED Notes (Signed)
Patient asked if it was possible for her to have an Ativan for the ride to [facility] due to anxiety, patient has been having some anxiety off and on throughout the shift

## 2020-05-15 NOTE — ED Notes (Signed)
Patient sister French Ana is visiting

## 2020-05-15 NOTE — Consult Note (Signed)
[facility] Face-to-Face Psychiatry Consult   Reason for Consult: Intentional drug overdose Referring Physician: Dr. Dolores Frame Patient Identification: Teresa Lang MRN:  [id number] Principal Diagnosis: <principal problem not specified> Diagnosis:  Active Problems:   PTSD (post-traumatic stress disorder)   Major depressive disorder, recurrent episode, moderate degree (HCC)   Pain of cervical spine   Chronic bilateral low back pain without sciatica (Fourth Area of Pain)   Chronic generalized pain   Tobacco use disorder   MDD (major depressive disorder), recurrent severe, without psychosis (HCC)   Anxiety   MDD (major depressive disorder), recurrent, severe, with psychosis (HCC)   Total Time spent with patient: 45 minutes  Subjective: "I took a few (5 pills) of my pain medication with the intention of going to sleep." Trany Lang is a 64 y.o. female patient presented to [facility] ED via POV voluntarily. Per the ED triage nurse note, it was reported that the patient took 20 5mg  oxycodone around 11 this morning (08.06.21). The patient was asked why she took the pills? She states, "I thought I needed them." When the RN asks the patient if she was trying to kill herself, she expresses no. The patient was tearful and shaky during triage. A&Ox4, skin is warm and dry, speech clear.  During the patient's initial psychiatric assessment, she voiced that she lost her father about two months ago and felt depressed.  She expressed she has been dealing with some family dynamics with her two children.  When she began to talk about it, she began to cry, stating, "I don't want to get into it." The patient was seen face-to-face by this provider; the chart was reviewed and consulted with Dr. 10.06.21 on 05/15/2020 due to the patient's care. It was discussed with the EDP that the patient does meet the criteria to be admitted to the psychiatric inpatient unit.  On evaluation, the patient is alert and oriented x 4,  calm, emotional, tearful, cooperative, and mood-congruent with affect.  The patient does not appear to be responding to internal or external stimuli. Neither is the patient presenting with any delusional thinking. The patient denies auditory or visual hallucinations. The patient denies suicidal, homicidal, or self-harm ideations.  She states, "I wanted to go to sleep."  The patient is not presenting with any psychotic or paranoid behaviors. During an encounter with the patient, she was able to answer questions appropriately.  Plan: The patient is a safety risk to herself and requires psychiatric inpatient admission for stabilization and treatment.  HPI:    Past Psychiatric History:  Anxiety Depression  Risk to Self:  Yes Risk to Others:  No Prior Inpatient Therapy:  Yes Prior Outpatient Therapy:   Yes  Past Medical History:  Past Medical History:  Diagnosis Date  . Allergy    seasonal  . Anxiety   . Arthritis   . Asthma   . COPD (chronic obstructive pulmonary disease) (HCC)   . Depression   . Dyspnea   . GERD (gastroesophageal reflux disease)   . Hyperlipidemia   . Hypertension     Past Surgical History:  Procedure Laterality Date  . ABDOMINAL HYSTERECTOMY  1983  . CERVICAL DISC SURGERY    . CHOLECYSTECTOMY  1983  . LUMBAR LAMINECTOMY/DECOMPRESSION MICRODISCECTOMY Right 11/24/2019   Procedure: OPEN L3/4 LAMINECTOMY WITH CYST REMOVAL;  Surgeon: 11/26/2019, MD;  Location: [facility] ORS;  Service: Neurosurgery;  Laterality: Right;  . SPINE SURGERY  beginning 1994   laminectomies, fusions   Family History:  Family  History  Problem Relation Age of Onset  . Celiac disease Sister   . Breast cancer Neg Hx    Family Psychiatric  History: Maternal- depression, and bipolar Social History:  Social History   Substance and Sexual Activity  Alcohol Use Never     Social History   Substance and Sexual Activity  Drug Use Not Currently  . Types: Marijuana    Social History    Socioeconomic History  . Marital status: Widowed    Spouse name: Not on file  . Number of children: Not on file  . Years of education: Not on file  . Highest education level: Not on file  Occupational History  . Occupation: disability  Tobacco Use  . Smoking status: Former Smoker    Packs/day: 0.25    Years: 57.00    Pack years: 14.25    Types: Cigarettes    Start date: 1962  . Smokeless tobacco: Never Used  . Tobacco comment: Also VAPES on a daily basis  Vaping Use  . Vaping Use: Every day  Substance and Sexual Activity  . Alcohol use: Never  . Drug use: Not Currently    Types: Marijuana  . Sexual activity: Not on file  Other Topics Concern  . Not on file  Social History Narrative   Lives with sister   Social Determinants of Health   Financial Resource Strain:   . Difficulty of Paying Living Expenses:   Food Insecurity:   . Worried About Programme researcher, broadcasting/film/video in the Last Year:   . Barista in the Last Year:   Transportation Needs:   . Freight forwarder (Medical):   [name] Kitchen Lack of Transportation (Non-Medical):   Physical Activity:   . Days of Exercise per Week:   . Minutes of Exercise per Session:   Stress:   . Feeling of Stress :   Social Connections:   . Frequency of Communication with Friends and Family:   . Frequency of Social Gatherings with Friends and Family:   . Attends Religious Services:   . Active Member of Clubs or Organizations:   . Attends Banker Meetings:   [name] Kitchen Marital Status:    Additional Social History:    Allergies:   Allergies  Allergen Reactions  . Prednisone Anxiety    Labs:  Results for orders placed or performed during the hospital encounter of 05/14/20 (from the past 48 hour(s))  Comprehensive metabolic panel     Status: Abnormal   Collection Time: 05/14/20  6:00 PM  Result Value Ref Range   Sodium 139 135 - 145 mmol/L   Potassium 4.0 3.5 - 5.1 mmol/L   Chloride 103 98 - 111 mmol/L   CO2 24 22 - 32  mmol/L   Glucose, Bld 124 (H) 70 - 99 mg/dL    Comment: Glucose reference range applies only to samples taken after fasting for at least 8 hours.   BUN 18 8 - 23 mg/dL   Creatinine, Ser 4.09 (H) 0.44 - 1.00 mg/dL   Calcium 81.1 (H) 8.9 - 10.3 mg/dL   Total Protein 7.6 6.5 - 8.1 g/dL   Albumin 4.4 3.5 - 5.0 g/dL   AST 27 15 - 41 U/L   ALT 22 0 - 44 U/L   Alkaline Phosphatase 70 38 - 126 U/L   Total Bilirubin 1.1 0.3 - 1.2 mg/dL   GFR calc non Af Amer 42 (L) >60 mL/min   GFR calc Af Amer 49 (L) >60  mL/min   Anion gap 12 5 - 15    Comment: Performed at [facility], [address] [place], [place] [address]  Ethanol     Status: None   Collection Time: 05/14/20  6:00 PM  Result Value Ref Range   Alcohol, Ethyl (B) <10 <10 mg/dL    Comment: (NOTE) Lowest detectable limit for serum alcohol is 10 mg/dL.  For medical purposes only. Performed at [facility], [address] [place], [place] [address]   cbc     Status: None   Collection Time: 05/14/20  6:00 PM  Result Value Ref Range   WBC 7.0 4.0 - 10.5 K/uL   RBC 4.75 3.87 - 5.11 MIL/uL   Hemoglobin 14.9 12.0 - 15.0 g/dL   HCT 16.6 36 - 46 %   MCV 95.8 80.0 - 100.0 fL   MCH 31.4 26.0 - 34.0 pg   MCHC 32.7 30.0 - 36.0 g/dL   RDW 06.3 01.6 - 01.0 %   Platelets 285 150 - 400 K/uL   nRBC 0.0 0.0 - 0.2 %    Comment: Performed at [facility], [address] [place], [place] [address]  Acetaminophen level     Status: Abnormal   Collection Time: 05/14/20  6:00 PM  Result Value Ref Range   Acetaminophen (Tylenol), Serum <10 (L) 10 - 30 ug/mL    Comment: (NOTE) Therapeutic concentrations vary significantly. A range of 10-30 ug/mL  may be an effective concentration for many patients. However, some  are best treated at concentrations outside of this range. Acetaminophen concentrations >150 ug/mL at 4 hours after ingestion  and >50 ug/mL at 12 hours after ingestion are often associated with  toxic  reactions.  Performed at [facility], [address] [place], [place] [address]   Lithium level     Status: Abnormal   Collection Time: 05/14/20  6:00 PM  Result Value Ref Range   Lithium Lvl 0.24 (L) 0.60 - 1.20 mmol/L    Comment: Performed at [facility], [address], [place], [place] [address]  Urine Drug Screen, Qualitative     Status: Abnormal   Collection Time: 05/14/20  6:04 PM  Result Value Ref Range   Tricyclic, Ur Screen NONE DETECTED NONE DETECTED   Amphetamines, Ur Screen NONE DETECTED NONE DETECTED   MDMA (Ecstasy)Ur Screen NONE DETECTED NONE DETECTED   Cocaine Metabolite,Ur [place] NONE DETECTED NONE DETECTED   Opiate, Ur Screen POSITIVE (A) NONE DETECTED   Phencyclidine (PCP) Ur S NONE DETECTED NONE DETECTED   Cannabinoid 50 Ng, Ur [place] NONE DETECTED NONE DETECTED   Barbiturates, Ur Screen NONE DETECTED NONE DETECTED   Benzodiazepine, Ur Scrn NONE DETECTED NONE DETECTED   Methadone Scn, Ur NONE DETECTED NONE DETECTED    Comment: (NOTE) Tricyclics + metabolites, urine    Cutoff 1000 ng/mL Amphetamines + metabolites, urine  Cutoff 1000 ng/mL MDMA (Ecstasy), urine              Cutoff 500 ng/mL Cocaine Metabolite, urine          Cutoff 300 ng/mL Opiate + metabolites, urine        Cutoff 300 ng/mL Phencyclidine (PCP), urine         Cutoff 25 ng/mL Cannabinoid, urine                 Cutoff 50 ng/mL Barbiturates + metabolites, urine  Cutoff 200 ng/mL Benzodiazepine, urine  Cutoff 200 ng/mL Methadone, urine                   Cutoff 300 ng/mL  The urine drug screen provides only a preliminary, unconfirmed analytical test result and should not be used for non-medical purposes. Clinical consideration and professional judgment should be applied to any positive drug screen result due to possible interfering substances. A more specific alternate chemical method must be used in order to obtain a confirmed analytical result. Gas chromatography /  mass spectrometry (GC/MS) is the preferred confirm atory method. Performed at [facility], [address] [place], [place] [address]     No current facility-administered medications for this encounter.   Current Outpatient Medications  Medication Sig Dispense Refill  . albuterol (VENTOLIN HFA) 108 (90 Base) MCG/ACT inhaler Inhale 2 puffs into the lungs every 6 (six) hours as needed for wheezing or shortness of breath. 6.7 g 1  . Brexpiprazole (REXULTI) 4 MG TABS Take 4 mg by mouth daily. Rexulti    . clonazePAM (KLONOPIN) 1 MG tablet Take 1 tablet (1 mg total) by mouth 2 (two) times daily as needed for up to 30 days for anxiety. 60 tablet 1  . diclofenac Sodium (VOLTAREN) 1 % GEL Apply topically 4 (four) times daily.    [name] Kitchen diltiazem (CARDIZEM CD) 240 MG 24 hr capsule Take 240 mg by mouth daily.    [name] Kitchen FLUoxetine (PROZAC) 20 MG capsule TAKE 3 CAPSULES BY MOUTH DAILY (Patient taking differently: Take 60 mg by mouth daily. ) 90 capsule 1  . fluticasone furoate-vilanterol (BREO ELLIPTA) 100-25 MCG/INH AEPB Inhale 1 puff into the lungs daily. (Patient not taking: Reported on 11/12/2019) 1 each 1  . folic acid (FOLVITE) 1 MG tablet Take 1 tablet (1 mg total) by mouth daily. 30 tablet 1  . lisinopril (ZESTRIL) 20 MG tablet Take 1 tablet (20 mg total) by mouth daily. 30 tablet 1  . lithium carbonate 300 MG capsule Take 1 capsule (300 mg total) by mouth at bedtime. (Patient taking differently: Take 300 mg by mouth every evening. ) 30 capsule 1  . methocarbamol (ROBAXIN) 500 MG tablet Take 1 tablet (500 mg total) by mouth every 6 (six) hours as needed for muscle spasms. 40 tablet 0  . methotrexate (RHEUMATREX) 2.5 MG tablet Take 17.5 mg by mouth once a week. Sunday evening    . Multiple Vitamin (MULTIVITAMIN) tablet Take 1 tablet by mouth daily.    [name] Kitchen olopatadine (PATANOL) 0.1 % ophthalmic solution Place 1 drop into both eyes 2 (two) times daily. (Patient not taking: Reported on 11/12/2019) 5 mL 1   . omeprazole (PRILOSEC) 40 MG capsule Take 40 mg by mouth daily as needed (Reflux).    [name] Kitchen oxyCODONE (OXY IR/ROXICODONE) 5 MG immediate release tablet Take 5 mg by mouth every 8 (eight) hours as needed.    [name] Kitchen oxyCODONE (OXY IR/ROXICODONE) 5 MG immediate release tablet Take 1 tablet (5 mg total) by mouth every 3 (three) hours as needed for moderate pain ((score 4 to 6)). 30 tablet 0  . pantoprazole (PROTONIX) 40 MG tablet Take 1 tablet (40 mg total) by mouth daily. (Patient not taking: Reported on 11/12/2019) 30 tablet 1  . potassium chloride (KLOR-CON) 10 MEQ tablet Take 10 mEq by mouth daily.    . TRELEGY ELLIPTA 100-62.5-25 MCG/INH AEPB Inhale 2 puffs into the lungs daily.    [name] Kitchen umeclidinium bromide (INCRUSE ELLIPTA) 62.5 MCG/INH AEPB Inhale 1 puff into the lungs daily. (Patient not  taking: Reported on 11/12/2019) 1 each 1    Musculoskeletal: Strength & Muscle Tone: within normal limits Gait & Station: normal Patient leans: N/A  Psychiatric Specialty Exam: Physical Exam Psychiatric:        Attention and Perception: Attention and perception normal.        Mood and Affect: Mood is anxious and depressed. Affect is blunt, flat and tearful.        Speech: Speech is delayed.        Behavior: Behavior is withdrawn. Behavior is cooperative.        Thought Content: Thought content includes suicidal ideation.        Cognition and Memory: Cognition and memory normal.        Judgment: Judgment is impulsive.     Review of Systems  Psychiatric/Behavioral: Positive for sleep disturbance and suicidal ideas. The patient is nervous/anxious.   All other systems reviewed and are negative.   Blood pressure 112/83, pulse 80, temperature 98.7 F (37.1 C), temperature source Oral, resp. rate 16, height 5\' 6"  (1.676 m), weight 76.2 kg, SpO2 94 %.Body mass index is 27.12 kg/m.  General Appearance: Casual and Guarded  Eye Contact:  Good  Speech:  Clear and Coherent  Volume:  Decreased  Mood:  Anxious,  Depressed, Hopeless and Worthless  Affect:  Blunt, Congruent, Depressed, Flat and Tearful  Thought Process:  Coherent  Orientation:  Full (Time, Place, and Person)  Thought Content:  Logical and Rumination  Suicidal Thoughts:  Yes.  without intent/plan  Homicidal Thoughts:  No  Memory:  Immediate;   Good Recent;   Good Remote;   Good  Judgement:  Fair  Insight:  Fair  Psychomotor Activity:  Normal  Concentration:  Concentration: Good and Attention Span: Good  Recall:  Good  Fund of Knowledge:  Good  Language:  Good  Akathisia:  Negative  Handed:  Right  AIMS (if indicated):     Assets:  Communication Skills Desire for Improvement Physical Health Resilience Social Support  ADL's:  Intact  Cognition:  WNL  Sleep:    Insomnia     Treatment Plan Summary: Plan The patient is a safety risk to self and requires psychiatric inpatient admission for stabilization and treatment.  Disposition: Recommend psychiatric Inpatient admission when medically cleared.  , NP 05/15/2020 3:53 AM

## 2020-05-15 NOTE — ED Notes (Signed)
Patients sister spoke with this RN as she was walking out and said that she knows her sister did not try and hurt herself she was taking the pain medication to help with her headache, patient sister French Ana wanted to take her home or take her to [facility]

## 2020-06-25 DIAGNOSIS — M791 Myalgia, unspecified site: Secondary | ICD-10-CM | POA: Insufficient documentation

## 2020-08-09 ENCOUNTER — Ambulatory Visit (INDEPENDENT_AMBULATORY_CARE_PROVIDER_SITE_OTHER): Payer: Medicare HMO | Admitting: Urology

## 2020-08-09 ENCOUNTER — Other Ambulatory Visit: Payer: Self-pay

## 2020-08-09 VITALS — BP 154/90 | HR 80 | Ht 65.0 in | Wt 170.0 lb

## 2020-08-09 DIAGNOSIS — R3129 Other microscopic hematuria: Secondary | ICD-10-CM | POA: Diagnosis not present

## 2020-08-09 DIAGNOSIS — R8290 Unspecified abnormal findings in urine: Secondary | ICD-10-CM | POA: Diagnosis not present

## 2020-08-09 DIAGNOSIS — R350 Frequency of micturition: Secondary | ICD-10-CM | POA: Diagnosis not present

## 2020-08-09 LAB — BLADDER SCAN AMB NON-IMAGING: Scan Result: 0

## 2020-08-09 NOTE — Progress Notes (Signed)
08/09/2020 8:21 AM   Teresa Lang [birth year] [id number]  Referring provider: Patrice Paradise, MD [address] [facility] [place],  [place] [address]  No chief complaint on file.   HPI: I was consulted to assess the patient is urinary incontinence.  She does not leak with coughing sneezing bending or lifting.  She has urge incontinence.  I think she is small volume bedwetting.  She wears 5 pads a day moderately wet or soaked.  She gets up 4-5 times at night.  She can hold urination for many hours and some days she does not void at all.  She does feel like she needs to urinate first thing in the morning  For 2 months her urine is dark with an odor.  She has had 5 neck operations from previous accident and a hysterectomy is prone to constipation.  Has a smoking history  No previous bladder surgery.  No history of infections or kidney stones.  No previous treatment   PMH: Past Medical History:  Diagnosis Date  . Allergy    seasonal  . Anxiety   . Arthritis   . Asthma   . COPD (chronic obstructive pulmonary disease) (HCC)   . Depression   . Dyspnea   . GERD (gastroesophageal reflux disease)   . Hyperlipidemia   . Hypertension     Surgical History: Past Surgical History:  Procedure Laterality Date  . ABDOMINAL HYSTERECTOMY  1983  . CERVICAL DISC SURGERY    . CHOLECYSTECTOMY  1983  . LUMBAR LAMINECTOMY/DECOMPRESSION MICRODISCECTOMY Right 11/24/2019   Procedure: OPEN L3/4 LAMINECTOMY WITH CYST REMOVAL;  Surgeon: Lucy Chris, MD;  Location: [facility] ORS;  Service: Neurosurgery;  Laterality: Right;  . SPINE SURGERY  beginning 1994   laminectomies, fusions    Home Medications:  Allergies as of 08/09/2020      Reactions   Prednisone Anxiety      Medication List       Accurate as of August 09, 2020  8:21 AM. If you have any questions, ask your nurse or doctor.        albuterol 108 (90 Base) MCG/ACT inhaler Commonly known as: VENTOLIN  HFA Inhale 2 puffs into the lungs every 6 (six) hours as needed for wheezing or shortness of breath.   clonazePAM 1 MG tablet Commonly known as: KLONOPIN Take 1 tablet (1 mg total) by mouth 2 (two) times daily as needed for up to 30 days for anxiety.   diclofenac Sodium 1 % Gel Commonly known as: VOLTAREN Apply topically 4 (four) times daily.   diltiazem 240 MG 24 hr capsule Commonly known as: CARDIZEM CD Take 240 mg by mouth daily.   FLUoxetine 20 MG capsule Commonly known as: PROZAC TAKE 3 CAPSULES BY MOUTH DAILY   fluticasone furoate-vilanterol 100-25 MCG/INH Aepb Commonly known as: BREO ELLIPTA Inhale 1 puff into the lungs daily.   folic acid 1 MG tablet Commonly known as: FOLVITE Take 1 tablet (1 mg total) by mouth daily.   lisinopril 20 MG tablet Commonly known as: ZESTRIL Take 1 tablet (20 mg total) by mouth daily.   lithium carbonate 300 MG capsule Take 1 capsule (300 mg total) by mouth at bedtime. What changed: when to take this   methocarbamol 500 MG tablet Commonly known as: ROBAXIN Take 1 tablet (500 mg total) by mouth every 6 (six) hours as needed for muscle spasms.   methotrexate 2.5 MG tablet Commonly known as: RHEUMATREX Take 17.5 mg by mouth once  a week. Sunday evening   multivitamin tablet Take 1 tablet by mouth daily.   olopatadine 0.1 % ophthalmic solution Commonly known as: PATANOL Place 1 drop into both eyes 2 (two) times daily.   omeprazole 40 MG capsule Commonly known as: PRILOSEC Take 40 mg by mouth daily as needed (Reflux).   oxyCODONE 5 MG immediate release tablet Commonly known as: Oxy IR/ROXICODONE Take 5 mg by mouth every 8 (eight) hours as needed.   oxyCODONE 5 MG immediate release tablet Commonly known as: Oxy IR/ROXICODONE Take 1 tablet (5 mg total) by mouth every 3 (three) hours as needed for moderate pain ((score 4 to 6)).   pantoprazole 40 MG tablet Commonly known as: PROTONIX Take 1 tablet (40 mg total) by mouth  daily.   potassium chloride 10 MEQ tablet Commonly known as: KLOR-CON Take 10 mEq by mouth daily.   Rexulti 4 MG Tabs Generic drug: Brexpiprazole Take 4 mg by mouth daily. Rexulti   Trelegy Ellipta 100-62.5-25 MCG/INH Aepb Generic drug: Fluticasone-Umeclidin-Vilant Inhale 2 puffs into the lungs daily.   umeclidinium bromide 62.5 MCG/INH Aepb Commonly known as: INCRUSE ELLIPTA Inhale 1 puff into the lungs daily.       Allergies:  Allergies  Allergen Reactions  . Prednisone Anxiety    Family History: Family History  Problem Relation Age of Onset  . Celiac disease Sister   . Breast cancer Neg Hx     Social History:  reports that she has quit smoking. Her smoking use included cigarettes. She started smoking about 59 years ago. She has a 14.25 pack-year smoking history. She has never used smokeless tobacco. She reports previous drug use. Drug: Marijuana. She reports that she does not drink alcohol.  ROS:                                        Physical Exam: There were no vitals taken for this visit.  Constitutional:  Alert and oriented, No acute distress. HEENT: [place] AT, moist mucus membranes.  Trachea midline, no masses. Cardiovascular: No clubbing, cyanosis, or edema. Respiratory: Normal respiratory effort, no increased work of breathing. GI: Abdomen is soft, nontender, nondistended, no abdominal masses GU: Very well supported bladder neck and no prolapse.  No stress incontinence with a light cough Skin: No rashes, bruises or suspicious lesions. Lymph: No cervical or inguinal adenopathy. Neurologic: Grossly intact, no focal deficits, moving all 4 extremities. Psychiatric: Normal mood and affect.  Laboratory Data: Lab Results  Component Value Date   WBC 7.0 05/14/2020   HGB 14.9 05/14/2020   HCT 45.5 05/14/2020   MCV 95.8 05/14/2020   PLT 285 05/14/2020    Lab Results  Component Value Date   CREATININE 1.32 (H) 05/14/2020    No  results found for: PSA  No results found for: TESTOSTERONE  Lab Results  Component Value Date   HGBA1C 5.5 03/21/2019    Urinalysis    Component Value Date/Time   COLORURINE AMBER (A) 11/20/2019 0804   APPEARANCEUR CLOUDY (A) 11/20/2019 0804   LABSPEC 1.016 11/20/2019 0804   PHURINE 7.0 11/20/2019 0804   GLUCOSEU NEGATIVE 11/20/2019 0804   HGBUR NEGATIVE 11/20/2019 0804   BILIRUBINUR NEGATIVE 11/20/2019 0804   KETONESUR NEGATIVE 11/20/2019 0804   PROTEINUR 30 (A) 11/20/2019 0804   NITRITE NEGATIVE 11/20/2019 0804   LEUKOCYTESUR NEGATIVE 11/20/2019 0804    Pertinent Imaging:   Assessment & Plan:  Patient has urge incontinence.  She voids very infrequently.  She has significant nocturia.  Her urine looked infected today.  She had microscopic hematuria.  The patient is a bit vague and spoke about dark urine.  Hopefully she just has a bladder infection but I I will do a hematuria work-up.  Ordered a CT scan is to come back for cystoscopy.  We will then start treating her urinary incontinence.  Call if urine culture is positive  1. Abnormal finding on urinalysis  - Urinalysis, Complete   No follow-ups on file.  Martina Sinner, MD  [facility] [address], [address] [place], [place] [address] [phone]

## 2020-08-09 NOTE — Patient Instructions (Signed)
Cystoscopy Cystoscopy is a procedure that is used to help diagnose and sometimes treat conditions that affect the lower urinary tract. The lower urinary tract includes the bladder and the urethra. The urethra is the tube that drains urine from the bladder. Cystoscopy is done using a thin, tube-shaped instrument with a light and camera at the end (cystoscope). The cystoscope may be hard or flexible, depending on the goal of the procedure. The cystoscope is inserted through the urethra, into the bladder. Cystoscopy may be recommended if you have:  Urinary tract infections that keep coming back.  Blood in the urine (hematuria).  An inability to control when you urinate (urinary incontinence) or an overactive bladder.  Unusual cells found in a urine sample.  A blockage in the urethra, such as a urinary stone.  Painful urination.  An abnormality in the bladder found during an intravenous pyelogram (IVP) or CT scan. Cystoscopy may also be done to remove a sample of tissue to be examined under a microscope (biopsy). What are the risks? Generally, this is a safe procedure. However, problems may occur, including:  Infection.  Bleeding.  What happens during the procedure?  1. You will be given one or more of the following: ? A medicine to numb the area (local anesthetic). 2. The area around the opening of your urethra will be cleaned. 3. The cystoscope will be passed through your urethra into your bladder. 4. Germ-free (sterile) fluid will flow through the cystoscope to fill your bladder. The fluid will stretch your bladder so that your health care provider can clearly examine your bladder walls. 5. Your doctor will look at the urethra and bladder. 6. The cystoscope will be removed The procedure may vary among health care providers  What can I expect after the procedure? After the procedure, it is common to have: 1. Some soreness or pain in your abdomen and urethra. 2. Urinary symptoms.  These include: ? Mild pain or burning when you urinate. Pain should stop within a few minutes after you urinate. This may last for up to 1 week. ? A small amount of blood in your urine for several days. ? Feeling like you need to urinate but producing only a small amount of urine. Follow these instructions at home: General instructions  Return to your normal activities as told by your health care provider.   Do not drive for 24 hours if you were given a sedative during your procedure.  Watch for any blood in your urine. If the amount of blood in your urine increases, call your health care provider.  If a tissue sample was removed for testing (biopsy) during your procedure, it is up to you to get your test results. Ask your health care provider, or the department that is doing the test, when your results will be ready.  Drink enough fluid to keep your urine pale yellow.  Keep all follow-up visits as told by your health care provider. This is important. Contact a health care provider if you:  Have pain that gets worse or does not get better with medicine, especially pain when you urinate.  Have trouble urinating.  Have more blood in your urine. Get help right away if you:  Have blood clots in your urine.  Have abdominal pain.  Have a fever or chills.  Are unable to urinate. Summary  Cystoscopy is a procedure that is used to help diagnose and sometimes treat conditions that affect the lower urinary tract.  Cystoscopy is done using   a thin, tube-shaped instrument with a light and camera at the end.  After the procedure, it is common to have some soreness or pain in your abdomen and urethra.  Watch for any blood in your urine. If the amount of blood in your urine increases, call your health care provider.  If you were prescribed an antibiotic medicine, take it as told by your health care provider. Do not stop taking the antibiotic even if you start to feel better. This  information is not intended to replace advice given to you by your health care provider. Make sure you discuss any questions you have with your health care provider. Document Revised: 09/17/2018 Document Reviewed: 09/17/2018 Elsevier Patient Education  2020 Elsevier Inc.   

## 2020-08-10 LAB — URINALYSIS, COMPLETE
Bilirubin, UA: NEGATIVE
Glucose, UA: NEGATIVE
Nitrite, UA: NEGATIVE
Specific Gravity, UA: 1.02 (ref 1.005–1.030)
Urobilinogen, Ur: 1 mg/dL (ref 0.2–1.0)
pH, UA: 6.5 (ref 5.0–7.5)

## 2020-08-10 LAB — BASIC METABOLIC PANEL
BUN/Creatinine Ratio: 12 (ref 12–28)
BUN: 11 mg/dL (ref 8–27)
CO2: 25 mmol/L (ref 20–29)
Calcium: 11.1 mg/dL — ABNORMAL HIGH (ref 8.7–10.3)
Chloride: 102 mmol/L (ref 96–106)
Creatinine, Ser: 0.9 mg/dL (ref 0.57–1.00)
GFR calc Af Amer: 78 mL/min/{1.73_m2} (ref >59)
GFR calc non Af Amer: 67 mL/min/{1.73_m2} (ref >59)
Glucose: 104 mg/dL — ABNORMAL HIGH (ref 65–99)
Potassium: 4.3 mmol/L (ref 3.5–5.2)
Sodium: 141 mmol/L (ref 134–144)

## 2020-08-10 LAB — MICROSCOPIC EXAMINATION: WBC, UA: >30 /hpf — AB (ref 0–5)

## 2020-08-13 LAB — CULTURE, URINE COMPREHENSIVE

## 2020-08-23 ENCOUNTER — Other Ambulatory Visit: Payer: Medicare HMO | Admitting: Urology

## 2020-08-31 ENCOUNTER — Ambulatory Visit
Admission: RE | Admit: 2020-08-31 | Discharge: 2020-08-31 | Disposition: A | Discharge status: Home / Self Care | Payer: Medicare HMO | Source: Ambulatory Visit | Attending: Urology | Admitting: Urology

## 2020-08-31 ENCOUNTER — Other Ambulatory Visit: Payer: Self-pay

## 2020-08-31 DIAGNOSIS — R3129 Other microscopic hematuria: Secondary | ICD-10-CM | POA: Diagnosis present

## 2020-08-31 IMAGING — CT CT ABD-PEL WO/W CM
3 of 12 series · 11 of 46 positions shown, 17 images · IV contrast (omnipaque)
Comparison: CT pelvis [DATE]

CLINICAL DATA: Hematuria.

EXAM:
CT ABDOMEN AND PELVIS WITHOUT AND WITH CONTRAST
TECHNIQUE: Multidetector CT imaging of the abdomen and pelvis was performed
following the standard protocol before and following the bolus
administration of intravenous contrast.
CONTRAST:  125mL OMNIPAQUE IOHEXOL 300 MG/ML  SOLN

[Series 2: abd without pre 5.00 · axial · non-contrast · 0.76mm/px · z∈[-1486,-1421]mm · 2 of 89 slices shown]
[im 13/89  soft-tissue]
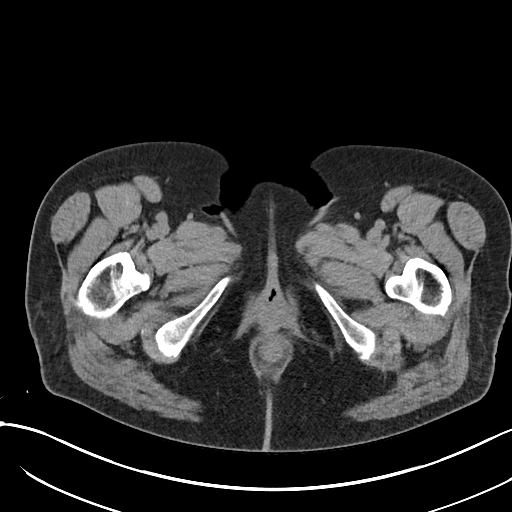
[im 26/89  soft-tissue]
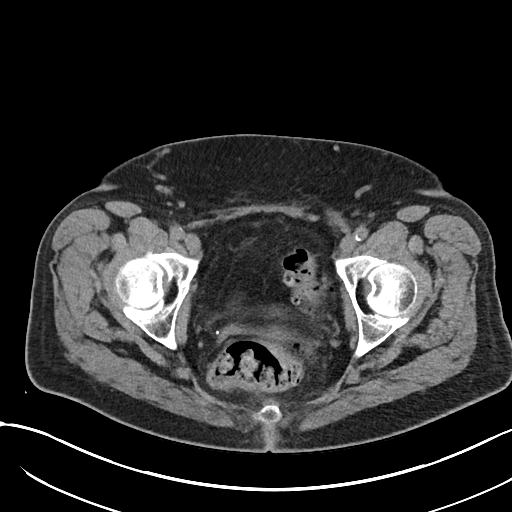

[Series 17: axial delay delay prone 5.00 · axial · delayed · 0.74mm/px · z∈[-1440,-1090]mm · 7 of 94 slices shown, 12 images]
[im 12/94  soft-tissue]
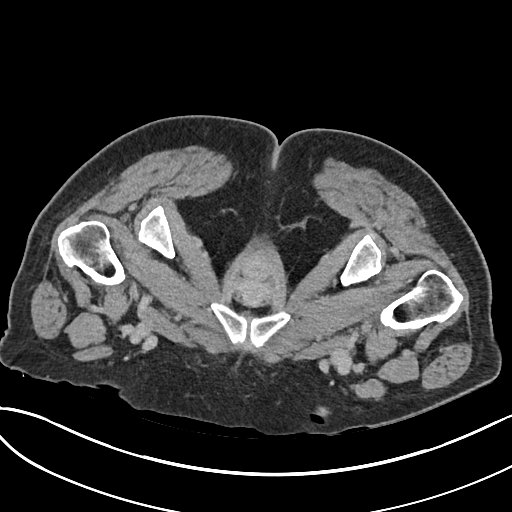
[im 12/94  bone]
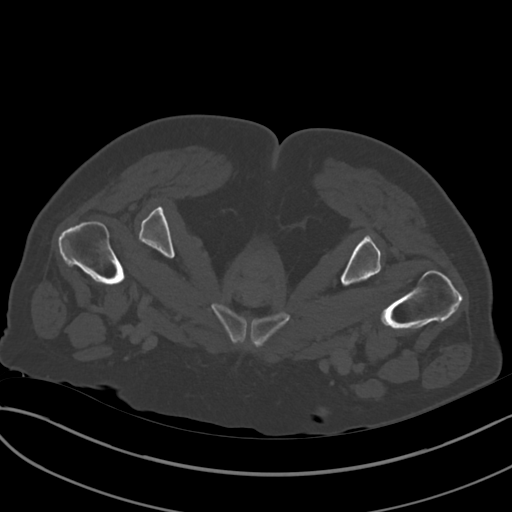
[im 24/94  soft-tissue]
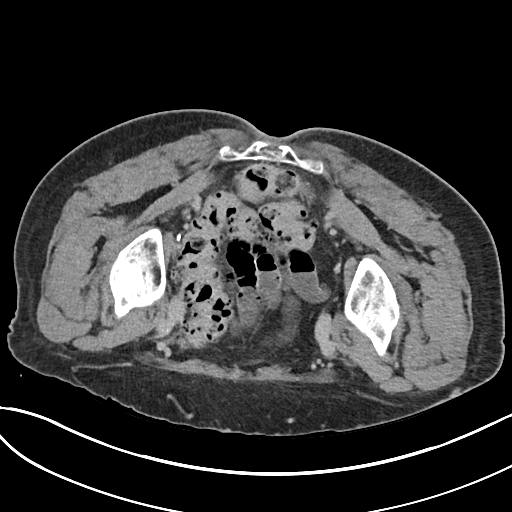
[im 35/94  soft-tissue]
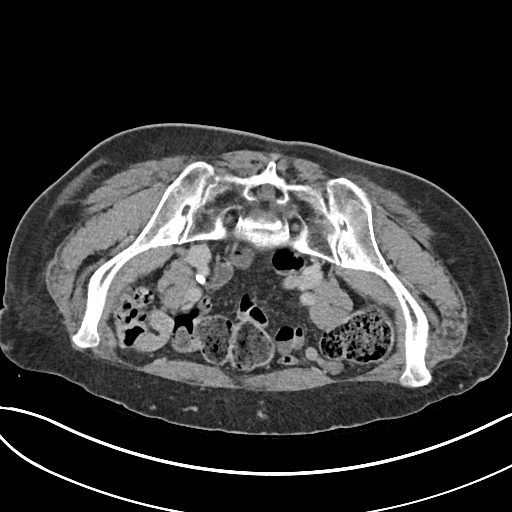
[im 47/94  soft-tissue]
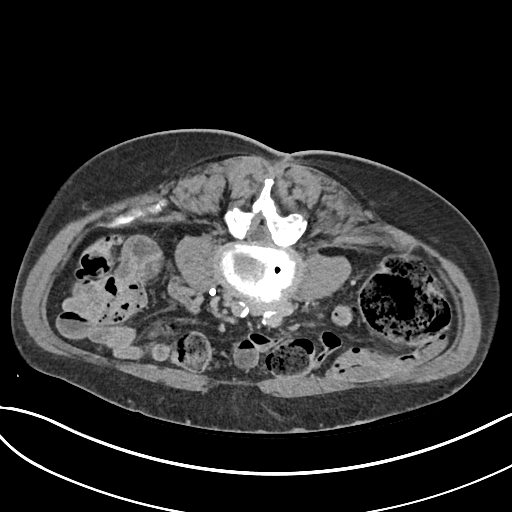
[im 47/94  lung]
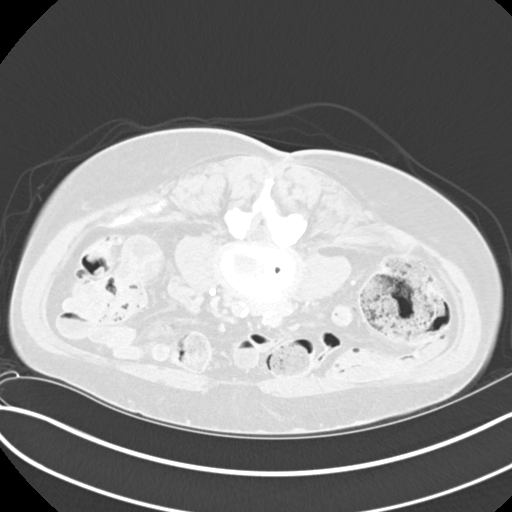
[im 59/94  soft-tissue]
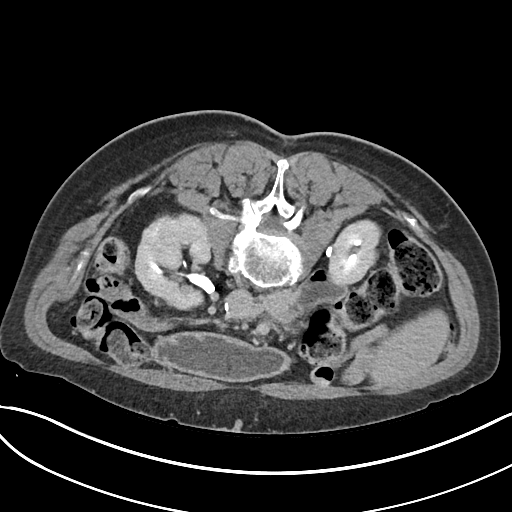
[im 59/94  lung]
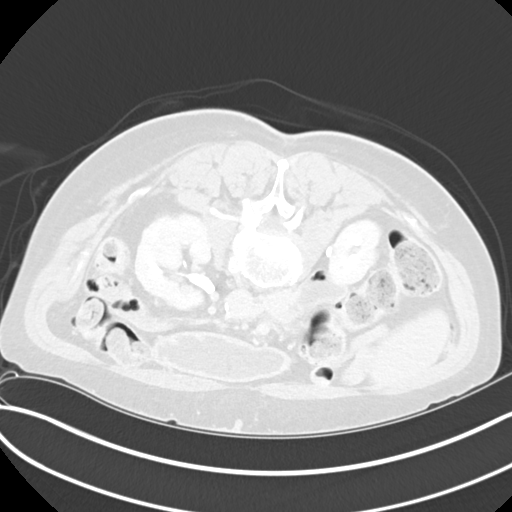
[im 70/94  soft-tissue]
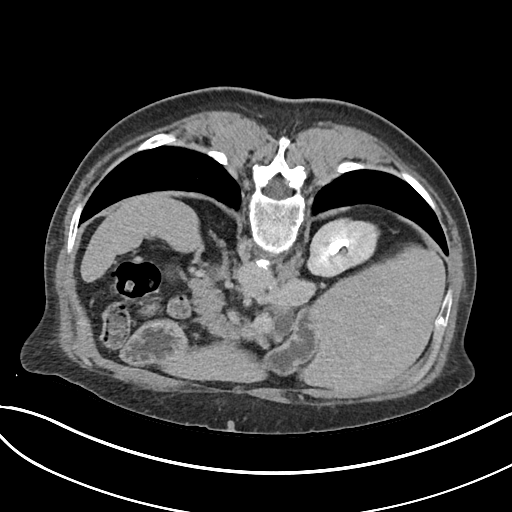
[im 70/94  lung]
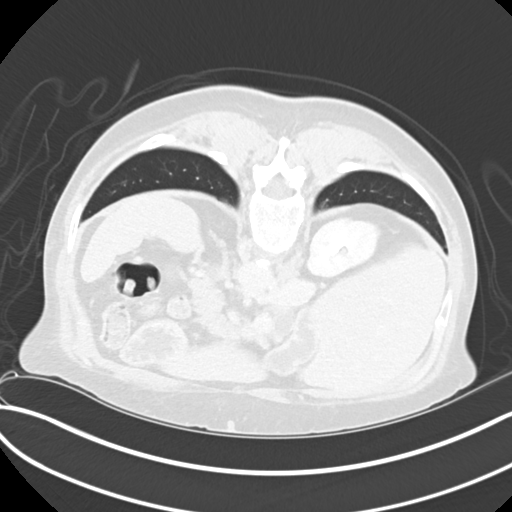
[im 82/94  soft-tissue]
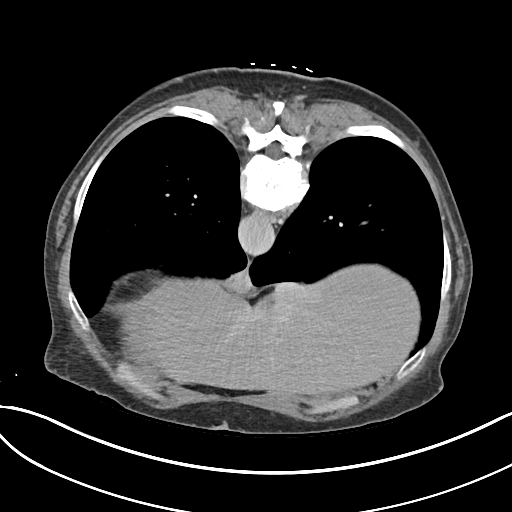
[im 82/94  lung]
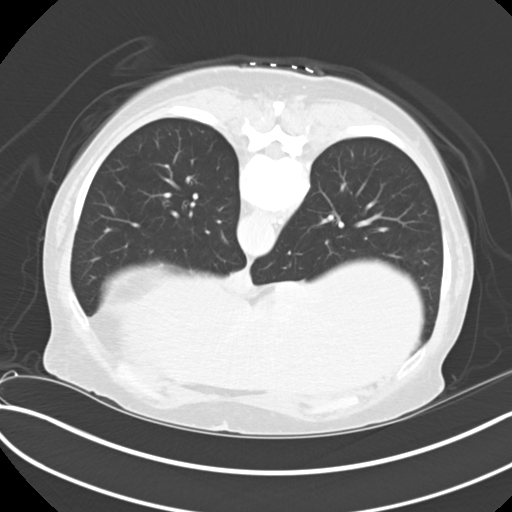

[Series 20: cor delay delay prone 2.00 cor · coronal · delayed · 0.74mm/px · 2 of 145 slices shown, 3 images]
[im 49/145  soft-tissue]
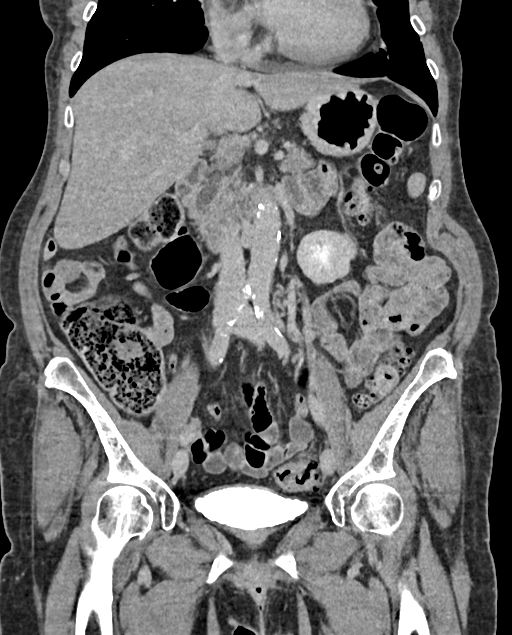
[im 49/145  bone]
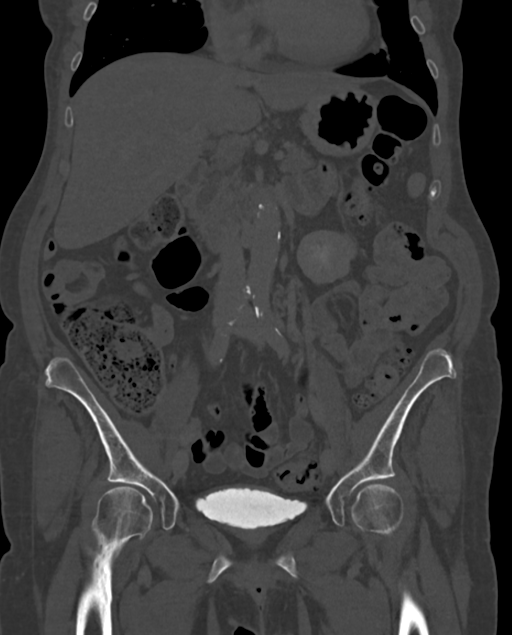
[im 97/145  soft-tissue]
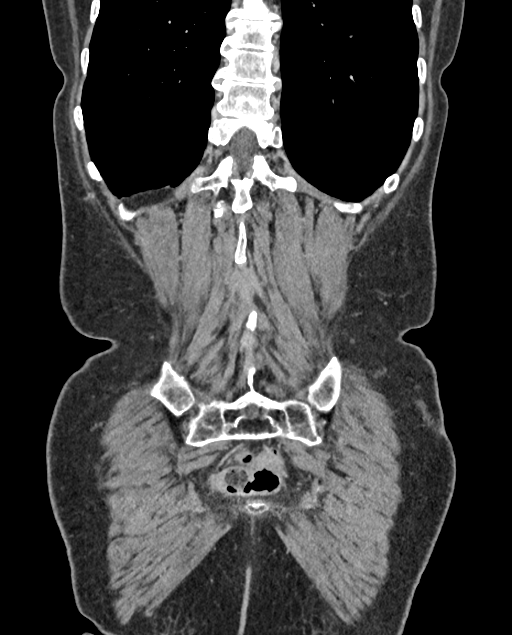

[11 of 46 positions shown; findings below may reference images not displayed]

FINDINGS: Lower chest: The lung bases are clear of acute process. No pleural
effusion or pulmonary lesions. The heart is normal in size. No
pericardial effusion. The distal esophagus and aorta are
unremarkable.

Hepatobiliary: No hepatic lesions or intrahepatic biliary
dilatation. The gallbladder is surgically absent. Mild central
intrahepatic biliary dilatation and moderate common bile duct
dilatation.

Pancreas: No mass, inflammation or ductal dilatation.

Spleen: Normal size.  No focal lesions.

Adrenals/Urinary Tract: The adrenal glands are unremarkable.

Renal cortical scarring changes versus fetal lobulations. Normal
renal cortical thickness. There is a simple upper pole left renal
cyst. No worrisome renal lesions or renal calculi. No obstructing
ureteral calculi or bladder calculi.

The delayed images do not demonstrate any significant collecting
system abnormalities.

The bladder is unremarkable. No bladder mass or asymmetric bladder
wall thickening.

Stomach/Bowel: The stomach, duodenum, small bowel and colon are
grossly normal without oral contrast. No acute inflammatory changes,
mass lesions or obstructive findings. Significant descending colon
and sigmoid colon diverticulosis but no findings for acute
diverticulitis. The terminal ileum is normal. The appendix is
normal.

Vascular/Lymphatic: Moderate aortic and iliac artery calcifications
but no aneurysm or dissection. The branch vessels are patent. The
major venous structures are patent. No mesenteric or retroperitoneal
mass or adenopathy.

Reproductive: Surgically absent.

Other: No pelvic mass or adenopathy. No free pelvic fluid
collections. No inguinal mass or adenopathy. No abdominal wall
hernia or subcutaneous lesions.

Musculoskeletal: No significant bony findings. Evidence of previous
back surgery. The hips are normally located. No significant
degenerative changes.
IMPRESSION: 1. No CT findings to account for the patient's hematuria. No renal,
ureteral or bladder calculi or mass.
2. Status post cholecystectomy with associated biliary dilatation.
3. Colonic diverticulosis without findings for acute diverticulitis.
4. Aortoiliac atherosclerosis.

Aortic Atherosclerosis ([G1]-[G1]).

## 2020-08-31 MED ORDER — IOHEXOL 300 MG/ML  SOLN
125.0000 mL | Freq: Once | INTRAMUSCULAR | Status: AC | PRN
  Administered 2020-08-31: 125 mL via INTRAVENOUS

## 2020-09-06 ENCOUNTER — Other Ambulatory Visit: Payer: Self-pay

## 2020-09-06 ENCOUNTER — Encounter: Payer: Self-pay | Admitting: Urology

## 2020-09-06 ENCOUNTER — Ambulatory Visit (INDEPENDENT_AMBULATORY_CARE_PROVIDER_SITE_OTHER): Payer: Medicare HMO | Admitting: Urology

## 2020-09-06 VITALS — BP 145/85 | HR 85

## 2020-09-06 DIAGNOSIS — N3941 Urge incontinence: Secondary | ICD-10-CM

## 2020-09-06 DIAGNOSIS — R3129 Other microscopic hematuria: Secondary | ICD-10-CM
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
## 2020-09-07 LAB — URINALYSIS, COMPLETE
Bilirubin, UA: NEGATIVE
Glucose, UA: NEGATIVE
Ketones, UA: NEGATIVE
Nitrite, UA: NEGATIVE
Protein,UA: NEGATIVE
Specific Gravity, UA: 1.025 (ref 1.005–1.030)
Urobilinogen, Ur: 0.2 mg/dL (ref 0.2–1.0)
pH, UA: 6 (ref 5.0–7.5)

## 2020-09-07 LAB — MICROSCOPIC EXAMINATION

## 2020-09-08 LAB — CULTURE, URINE COMPREHENSIVE

## 2020-09-24 ENCOUNTER — Telehealth: Payer: Self-pay | Admitting: *Deleted

## 2020-09-24 NOTE — Telephone Encounter (Signed)
Pt calling stating that she was given 50mg  samples of Myrbetriq and they are not working, pt is asking for something else to try before her visit in January.

## 2020-09-27 MED ORDER — OXYBUTYNIN CHLORIDE ER 10 MG PO TB24
10.0000 mg | ORAL_TABLET | Freq: Every day | ORAL | 11 refills | Status: DC
Start: 2020-09-27 — End: 2021-01-03

## 2020-09-27 NOTE — Telephone Encounter (Signed)
Spoke with patient and advised results rx sent to pharmacy by e-script Med list updated

## 2020-09-27 NOTE — Telephone Encounter (Signed)
Oxybutynin ER 10 mg 30x11 

## 2020-10-18 ENCOUNTER — Ambulatory Visit: Payer: Medicare Other | Admitting: Urology

## 2020-10-18 ENCOUNTER — Encounter: Payer: Self-pay | Admitting: Urology

## 2020-10-18 ENCOUNTER — Other Ambulatory Visit: Payer: Self-pay

## 2020-10-18 VITALS — BP 152/82 | HR 84

## 2020-10-18 DIAGNOSIS — N3946 Mixed incontinence: Secondary | ICD-10-CM
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
## 2020-11-15 ENCOUNTER — Ambulatory Visit: Payer: Self-pay | Admitting: Urology

## 2020-11-24 ENCOUNTER — Other Ambulatory Visit: Payer: Self-pay | Admitting: Urology

## 2020-11-29 ENCOUNTER — Ambulatory Visit: Payer: Medicare Other | Admitting: Urology

## 2020-11-29 ENCOUNTER — Encounter: Payer: Self-pay | Admitting: Urology

## 2020-11-29 ENCOUNTER — Other Ambulatory Visit: Payer: Self-pay

## 2020-11-29 VITALS — BP 133/80 | HR 88

## 2020-11-29 DIAGNOSIS — N3946 Mixed incontinence: Secondary | ICD-10-CM

## 2020-11-29 MED ORDER — SOLIFENACIN SUCCINATE 5 MG PO TABS: 5.0000 mg | ORAL_TABLET | Freq: Every day | ORAL | 11 refills | Status: DC

## 2020-11-29 NOTE — Progress Notes (Signed)
11/29/2020 9:10 AM   Teresa Lang 1956-04-23 [id number]  Referring provider: Patrice Paradise, MD [address] [facility] [address] [facility]  [place] [address]  Chief Complaint  Patient presents with  . Follow-up    HPI: I was consulted to assess the patient is urinary incontinence. She does not leak with coughing sneezing bending or lifting. She has urge incontinence. I think she is small volume bedwetting. She wears 5 pads a day moderately wet or soaked.  She gets up 4-5 times at night. She can hold urination for many hours and some days she does not void at all. She does feel like she needs to urinate first thing in the morning  For 2 months her urine is dark with an odor.  She has had 5 neck operations from previous accident and a hysterectomy is prone to constipation.Has a smoking history  Patient has urge incontinence. She voids very infrequently. She has significant nocturia. Her urine looked infected today. She had microscopic hematuria. The patient is a bit vague and spoke about dark urine. Hopefully she just has a bladder infection but I I will do a hematuria work-up.   Urine culture positive. CT scan negative cystoscopy normal  Patient said dark urine went away but she still has urgency incontinence  Reassess in 6 weeks on Myrbetriq 50 mg samples and prescription. Call if urine culture is positive. If urine culture positive consider prophylaxis.  Patient switched to oxybutynin 10 mg. Patient 50% improved.  Less frequency.  Less urge incontinence.  Still wearing 3-4 pads a day with variable amount of leakage.  Clinically not infected  We will order urodynamics and her sister will help drive her.  Today Frequency stable.  Incontinence stable.  Last urine culture negative On urodynamics she voided less than 20 mL and was catheterized for 300 mL.  Patient's maximum bladder capacity was 513 mL.  Bladder was unstable  reaching a pressure of 7 cm of water.  She did not feel the low pressure contraction.  She then had an unstable bladder contraction 26 cm of water and leaked with urgency office contraction.  She had no stress incontinence with a Valsalva pressure 149 cm of water.  During voluntary voiding she voided 491 mL.  Maximum flow was 26 mils per second.  Maximum voiding pressure 20 cm of water.  Residual was 30 mL.  EMG activity was noted during voiding.  Bladder neck descended 2 cm.  she had a normal flow curve during the voluntary void she had mild bladder trabeculation.  The details of the urodynamics are signed dictated   PMH: Past Medical History:  Diagnosis Date  . Allergy    seasonal  . Anxiety   . Arthritis   . Asthma   . COPD (chronic obstructive pulmonary disease) (HCC)   . Depression   . Dyspnea   . GERD (gastroesophageal reflux disease)   . Hyperlipidemia   . Hypertension     Surgical History: Past Surgical History:  Procedure Laterality Date  . ABDOMINAL HYSTERECTOMY  1983  . CERVICAL DISC SURGERY    . CHOLECYSTECTOMY  1983  . LUMBAR LAMINECTOMY/DECOMPRESSION MICRODISCECTOMY Right 11/24/2019   Procedure: OPEN L3/4 LAMINECTOMY WITH CYST REMOVAL;  Surgeon: Lucy Chris, MD;  Location: [facility] ORS;  Service: Neurosurgery;  Laterality: Right;  . SPINE SURGERY  beginning 1994   laminectomies, fusions    Home Medications:  Allergies as of 11/29/2020      Reactions   Prednisone Anxiety  Medication List       Accurate as of November 29, 2020  9:10 AM. If you have any questions, ask your nurse or doctor.        albuterol 108 (90 Base) MCG/ACT inhaler Commonly known as: VENTOLIN HFA Inhale 2 puffs into the lungs every 6 (six) hours as needed for wheezing or shortness of breath.   ARIPiprazole 10 MG tablet Commonly known as: ABILIFY Take 10 mg by mouth daily.   atorvastatin 20 MG tablet Commonly known as: LIPITOR Take by mouth.   busPIRone 10 MG tablet Commonly known  as: BUSPAR TAKE 1 TABLET BY MOUTH TWICE A DAY FOR 1 WEEK THEN TAKE 1 TABLET 3 TIMES A DAY   diclofenac Sodium 1 % Gel Commonly known as: VOLTAREN Apply topically 4 (four) times daily.   diltiazem 240 MG 24 hr capsule Commonly known as: CARDIZEM CD Take 240 mg by mouth daily.   diltiazem 240 MG 24 hr capsule Commonly known as: TIAZAC Take by mouth.   DULoxetine 30 MG capsule Commonly known as: CYMBALTA Take by mouth.   DULoxetine 60 MG capsule Commonly known as: CYMBALTA Take 60 mg by mouth daily.   fluticasone furoate-vilanterol 100-25 MCG/INH Aepb Commonly known as: BREO ELLIPTA Inhale 1 puff into the lungs daily.   folic acid 1 MG tablet Commonly known as: FOLVITE Take 1 tablet (1 mg total) by mouth daily.   hydrocortisone 2.5 % cream Apply topically.   lisinopril 20 MG tablet Commonly known as: ZESTRIL Take 1 tablet (20 mg total) by mouth daily.   lithium carbonate 300 MG capsule Take 1 capsule (300 mg total) by mouth at bedtime. What changed: when to take this   LORazepam 1 MG tablet Commonly known as: ATIVAN Take 1 mg by mouth daily as needed.   methotrexate 2.5 MG tablet Commonly known as: RHEUMATREX Take 17.5 mg by mouth once a week. Sunday evening   multivitamin tablet Take 1 tablet by mouth daily.   olopatadine 0.1 % ophthalmic solution Commonly known as: PATANOL Place 1 drop into both eyes 2 (two) times daily.   omeprazole 40 MG capsule Commonly known as: PRILOSEC Take 40 mg by mouth daily as needed (Reflux).   oxybutynin 10 MG 24 hr tablet Commonly known as: DITROPAN-XL Take 1 tablet (10 mg total) by mouth at bedtime.   potassium chloride 10 MEQ tablet Commonly known as: KLOR-CON Take 10 mEq by mouth daily.   Rexulti 4 MG Tabs Generic drug: Brexpiprazole Take 4 mg by mouth daily. Rexulti   tiZANidine 4 MG tablet Commonly known as: ZANAFLEX Take 4 mg by mouth 3 (three) times daily as needed.   Trelegy Ellipta 100-62.5-25 MCG/INH  Aepb Generic drug: Fluticasone-Umeclidin-Vilant Inhale 2 puffs into the lungs daily.   umeclidinium bromide 62.5 MCG/INH Aepb Commonly known as: INCRUSE ELLIPTA Inhale 1 puff into the lungs daily.   valACYclovir 500 MG tablet Commonly known as: VALTREX Take 500 mg by mouth 2 (two) times daily.       Allergies:  Allergies  Allergen Reactions  . Prednisone Anxiety    Family History: Family History  Problem Relation Age of Onset  . Celiac disease Sister   . Breast cancer Neg Hx     Social History:  reports that she has quit smoking. Her smoking use included cigarettes. She started smoking about 60 years ago. She has a 14.25 pack-year smoking history. She has never used smokeless tobacco. She reports previous drug use. Drug: Marijuana. She reports that  she does not drink alcohol.  ROS:                                        Physical Exam: There were no vitals taken for this visit.  Constitutional:  Alert and oriented, No acute distress.   Laboratory Data: Lab Results  Component Value Date   WBC 7.0 05/14/2020   HGB 14.9 05/14/2020   HCT 45.5 05/14/2020   MCV 95.8 05/14/2020   PLT 285 05/14/2020    Lab Results  Component Value Date   CREATININE 0.90 08/09/2020    No results found for: PSA  No results found for: TESTOSTERONE  Lab Results  Component Value Date   HGBA1C 5.5 03/21/2019    Urinalysis    Component Value Date/Time   COLORURINE AMBER (A) 11/20/2019 0804   APPEARANCEUR Cloudy (A) 09/06/2020 0758   LABSPEC 1.016 11/20/2019 0804   PHURINE 7.0 11/20/2019 0804   GLUCOSEU Negative 09/06/2020 0758   HGBUR NEGATIVE 11/20/2019 0804   BILIRUBINUR Negative 09/06/2020 0758   KETONESUR NEGATIVE 11/20/2019 0804   PROTEINUR Negative 09/06/2020 0758   PROTEINUR 30 (A) 11/20/2019 0804   NITRITE Negative 09/06/2020 0758   NITRITE NEGATIVE 11/20/2019 0804   LEUKOCYTESUR 1+ (A) 09/06/2020 0758   LEUKOCYTESUR NEGATIVE 11/20/2019  0804    Pertinent Imaging:   Assessment & Plan:  Patient primarily has an overactive bladder.  Like to try 1 more medication.  If this fails I will discussed 3 refractory therapies with her.  She continues to be moderately improved on the oxybutynin so may combine this with Vesicare 5 mg.  Discussed 3 refractory therapies next visit depending on how she does.  If she does really well I can always stop the oxybutynin  There are no diagnoses linked to this encounter.  No follow-ups on file.  Martina Sinner, MD  [facility] [address] [place], [place] [address] [phone]

## 2021-01-03 ENCOUNTER — Ambulatory Visit: Payer: Medicare Other | Admitting: Urology

## 2021-01-03 ENCOUNTER — Other Ambulatory Visit: Payer: Self-pay

## 2021-01-03 ENCOUNTER — Encounter: Payer: Self-pay | Admitting: Urology

## 2021-01-03 VITALS — BP 128/79 | HR 75

## 2021-01-03 DIAGNOSIS — N3946 Mixed incontinence: Secondary | ICD-10-CM | POA: Diagnosis not present

## 2021-01-03 NOTE — Progress Notes (Signed)
01/03/2021 8:48 AM   [name] [birth year] [id number]  Referring provider: Patrice Paradise, MD [address] [facility] [address] [facility] [place],  [place] [address]  Chief Complaint  Patient presents with  . Urinary Incontinence    HPI: I was consulted to assess the patient is urinary incontinence. She does not leak with coughing sneezing bending or lifting. She has urge incontinence. I think she is small volume bedwetting. She wears 5 pads a day moderately wet or soaked.  She gets up 4-5 times at night. She can hold urination for many hours and some days she does not void at all. She does feel like she needs to urinate first thing in the morning  For 2 months her urine is dark with an odor.  She has had 5 neck operations from previous accident and a hysterectomy is prone to constipation.Has a smoking history  Patient has urge incontinence. She voids very infrequently. She has significant nocturia. Her urine looked infected today. She had microscopic hematuria. The patient is a bit vague and spoke about dark urine. Hopefully she just has a bladder infection but I I will do a hematuria work-up.   Urine culture positive. CT scan negativecystoscopy normal  Patient said dark urine went away but she still has urgency incontinence  Reassess in 6 weeks on Myrbetriq 50 mg samples and prescription. Call if urine culture is positive. If urine culture positive consider prophylaxis.  Patient switched to oxybutynin 10 mg. Patient 50% improved. Less frequency. Less urge incontinence. Still wearing 3-4 pads a day with variable amount of leakage. Clinically not infected  On urodynamics she voided less than 20 mL and was catheterized for 300 mL.  Patient's maximum bladder capacity was 513 mL.  Bladder was unstable reaching a pressure of 7 cm of water.  She did not feel the low pressure contraction.  She then had an unstable bladder contraction 26 cm  of water and leaked with urgency office contraction.  She had no stress incontinence with a Valsalva pressure 149 cm of water.  During voluntary voiding she voided 491 mL.  Maximum flow was 26 mils per second.  Maximum voiding pressure 20 cm of water.  Residual was 30 mL.  EMG activity was noted during voiding.  Bladder neck descended 2 cm.  she had a normal flow curve during the voluntary void she had mild bladder trabeculation.    Patient primarily has an overactive bladder.  Like to try 1 more medication.  If this fails I will discussed 3 refractory therapies with her.  She continues to be moderately improved on the oxybutynin so may combine this with Vesicare 5 mg.  Discussed 3 refractory therapies next visit depending on how she does.  If she does really well I can always stop the oxybutynin  Today Frequency stable.  Dramatic improvement in continence.  She was smiling because she is so happy.  No infections.  Tolerating both medications well   PMH: Past Medical History:  Diagnosis Date  . Allergy    seasonal  . Anxiety   . Arthritis   . Asthma   . COPD (chronic obstructive pulmonary disease) (HCC)   . Depression   . Dyspnea   . GERD (gastroesophageal reflux disease)   . Hyperlipidemia   . Hypertension     Surgical History: Past Surgical History:  Procedure Laterality Date  . ABDOMINAL HYSTERECTOMY  1983  . CERVICAL DISC SURGERY    . CHOLECYSTECTOMY  1983  . LUMBAR  LAMINECTOMY/DECOMPRESSION MICRODISCECTOMY Right 11/24/2019   Procedure: OPEN L3/4 LAMINECTOMY WITH CYST REMOVAL;  Surgeon: Lucy Chris, MD;  Location: [facility] ORS;  Service: Neurosurgery;  Laterality: Right;  . SPINE SURGERY  beginning 1994   laminectomies, fusions    Home Medications:  Allergies as of 01/03/2021      Reactions   Prednisone Anxiety      Medication List       Accurate as of January 03, 2021  8:48 AM. If you have any questions, ask your nurse or doctor.        albuterol 108 (90 Base) MCG/ACT  inhaler Commonly known as: VENTOLIN HFA Inhale 2 puffs into the lungs every 6 (six) hours as needed for wheezing or shortness of breath.   ARIPiprazole 10 MG tablet Commonly known as: ABILIFY Take 10 mg by mouth daily.   atorvastatin 20 MG tablet Commonly known as: LIPITOR Take by mouth.   busPIRone 10 MG tablet Commonly known as: BUSPAR TAKE 1 TABLET BY MOUTH TWICE A DAY FOR 1 WEEK THEN TAKE 1 TABLET 3 TIMES A DAY   diclofenac Sodium 1 % Gel Commonly known as: VOLTAREN Apply topically 4 (four) times daily.   diltiazem 240 MG 24 hr capsule Commonly known as: CARDIZEM CD Take 240 mg by mouth daily.   diltiazem 240 MG 24 hr capsule Commonly known as: TIAZAC Take by mouth.   DULoxetine 30 MG capsule Commonly known as: CYMBALTA Take by mouth.   DULoxetine 60 MG capsule Commonly known as: CYMBALTA Take 60 mg by mouth daily.   folic acid 1 MG tablet Commonly known as: FOLVITE Take 1 tablet (1 mg total) by mouth daily.   hydrocortisone 2.5 % cream Apply topically.   lisinopril 20 MG tablet Commonly known as: ZESTRIL Take 1 tablet (20 mg total) by mouth daily.   lithium carbonate 300 MG capsule Take 1 capsule (300 mg total) by mouth at bedtime. What changed: when to take this   LORazepam 1 MG tablet Commonly known as: ATIVAN Take 1 mg by mouth daily as needed.   methotrexate 2.5 MG tablet Commonly known as: RHEUMATREX Take 17.5 mg by mouth once a week. Sunday evening   multivitamin tablet Take 1 tablet by mouth daily.   olopatadine 0.1 % ophthalmic solution Commonly known as: PATANOL Place 1 drop into both eyes 2 (two) times daily.   omeprazole 40 MG capsule Commonly known as: PRILOSEC Take 40 mg by mouth daily as needed (Reflux).   oxybutynin 10 MG 24 hr tablet Commonly known as: DITROPAN-XL Take 1 tablet (10 mg total) by mouth at bedtime.   potassium chloride 10 MEQ tablet Commonly known as: KLOR-CON Take 10 mEq by mouth daily.   solifenacin 5  MG tablet Commonly known as: VESICARE Take 1 tablet (5 mg total) by mouth daily.   Trelegy Ellipta 100-62.5-25 MCG/INH Aepb Generic drug: Fluticasone-Umeclidin-Vilant Inhale 2 puffs into the lungs daily.   umeclidinium bromide 62.5 MCG/INH Aepb Commonly known as: INCRUSE ELLIPTA Inhale 1 puff into the lungs daily.   valACYclovir 500 MG tablet Commonly known as: VALTREX Take 500 mg by mouth 2 (two) times daily.       Allergies:  Allergies  Allergen Reactions  . Prednisone Anxiety    Family History: Family History  Problem Relation Age of Onset  . Celiac disease Sister   . Breast cancer Neg Hx     Social History:  reports that she has quit smoking. Her smoking use included cigarettes. She started smoking  about 60 years ago. She has a 14.25 pack-year smoking history. She has never used smokeless tobacco. She reports previous drug use. Drug: Marijuana. She reports that she does not drink alcohol.  ROS:                                        Physical Exam: There were no vitals taken for this visit.  Constitutional:  Alert and oriented, No acute distress.  Laboratory Data: Lab Results  Component Value Date   WBC 7.0 05/14/2020   HGB 14.9 05/14/2020   HCT 45.5 05/14/2020   MCV 95.8 05/14/2020   PLT 285 05/14/2020    Lab Results  Component Value Date   CREATININE 0.90 08/09/2020    No results found for: PSA  No results found for: TESTOSTERONE  Lab Results  Component Value Date   HGBA1C 5.5 03/21/2019    Urinalysis    Component Value Date/Time   COLORURINE AMBER (A) 11/20/2019 0804   APPEARANCEUR Cloudy (A) 09/06/2020 0758   LABSPEC 1.016 11/20/2019 0804   PHURINE 7.0 11/20/2019 0804   GLUCOSEU Negative 09/06/2020 0758   HGBUR NEGATIVE 11/20/2019 0804   BILIRUBINUR Negative 09/06/2020 0758   KETONESUR NEGATIVE 11/20/2019 0804   PROTEINUR Negative 09/06/2020 0758   PROTEINUR 30 (A) 11/20/2019 0804   NITRITE Negative  09/06/2020 0758   NITRITE NEGATIVE 11/20/2019 0804   LEUKOCYTESUR 1+ (A) 09/06/2020 0758   LEUKOCYTESUR NEGATIVE 11/20/2019 0804    Pertinent Imaging:   Assessment & Plan: Reassess durability of combination treatment of Vesicare and oxybutynin in 4 months.  There are no diagnoses linked to this encounter.  No follow-ups on file.  Martina Sinner, MD  [facility] [address], [address] [place], [place] [address] [phone]

## 2021-03-19 ENCOUNTER — Inpatient Hospital Stay
Admission: EM | Admit: 2021-03-19 | Discharge: 2021-03-23 | DRG: 522 | Disposition: A | Discharge status: Home Health | Payer: Medicare Other | Attending: Internal Medicine | Admitting: Internal Medicine

## 2021-03-19 ENCOUNTER — Other Ambulatory Visit: Payer: Self-pay

## 2021-03-19 ENCOUNTER — Emergency Department: Payer: Medicare Other

## 2021-03-19 ENCOUNTER — Encounter: Payer: Self-pay | Admitting: Internal Medicine

## 2021-03-19 DIAGNOSIS — Z8379 Family history of other diseases of the digestive system: Secondary | ICD-10-CM

## 2021-03-19 DIAGNOSIS — G894 Chronic pain syndrome: Secondary | ICD-10-CM | POA: Diagnosis present

## 2021-03-19 DIAGNOSIS — Z90710 Acquired absence of both cervix and uterus: Secondary | ICD-10-CM

## 2021-03-19 DIAGNOSIS — Z96649 Presence of unspecified artificial hip joint: Secondary | ICD-10-CM

## 2021-03-19 DIAGNOSIS — Y92009 Unspecified place in unspecified non-institutional (private) residence as the place of occurrence of the external cause: Secondary | ICD-10-CM | POA: Diagnosis not present

## 2021-03-19 DIAGNOSIS — F419 Anxiety disorder, unspecified: Secondary | ICD-10-CM | POA: Diagnosis present

## 2021-03-19 DIAGNOSIS — M8589 Other specified disorders of bone density and structure, multiple sites: Secondary | ICD-10-CM | POA: Diagnosis present

## 2021-03-19 DIAGNOSIS — W109XXA Fall (on) (from) unspecified stairs and steps, initial encounter: Secondary | ICD-10-CM | POA: Diagnosis present

## 2021-03-19 DIAGNOSIS — S72001A Fracture of unspecified part of neck of right femur, initial encounter for closed fracture: Principal | ICD-10-CM | POA: Diagnosis present

## 2021-03-19 DIAGNOSIS — Z87891 Personal history of nicotine dependence: Secondary | ICD-10-CM | POA: Diagnosis not present

## 2021-03-19 DIAGNOSIS — Y9301 Activity, walking, marching and hiking: Secondary | ICD-10-CM | POA: Diagnosis present

## 2021-03-19 DIAGNOSIS — J441 Chronic obstructive pulmonary disease with (acute) exacerbation: Secondary | ICD-10-CM | POA: Diagnosis present

## 2021-03-19 DIAGNOSIS — Z01818 Encounter for other preprocedural examination: Secondary | ICD-10-CM

## 2021-03-19 DIAGNOSIS — Z7951 Long term (current) use of inhaled steroids: Secondary | ICD-10-CM

## 2021-03-19 DIAGNOSIS — E785 Hyperlipidemia, unspecified: Secondary | ICD-10-CM | POA: Diagnosis present

## 2021-03-19 DIAGNOSIS — Z79899 Other long term (current) drug therapy: Secondary | ICD-10-CM | POA: Diagnosis not present

## 2021-03-19 DIAGNOSIS — W19XXXA Unspecified fall, initial encounter: Secondary | ICD-10-CM | POA: Diagnosis not present

## 2021-03-19 DIAGNOSIS — F32A Depression, unspecified: Secondary | ICD-10-CM | POA: Diagnosis present

## 2021-03-19 DIAGNOSIS — K219 Gastro-esophageal reflux disease without esophagitis: Secondary | ICD-10-CM | POA: Diagnosis present

## 2021-03-19 DIAGNOSIS — Z9049 Acquired absence of other specified parts of digestive tract: Secondary | ICD-10-CM

## 2021-03-19 DIAGNOSIS — J449 Chronic obstructive pulmonary disease, unspecified: Secondary | ICD-10-CM | POA: Diagnosis present

## 2021-03-19 DIAGNOSIS — I10 Essential (primary) hypertension: Secondary | ICD-10-CM | POA: Diagnosis present

## 2021-03-19 DIAGNOSIS — M069 Rheumatoid arthritis, unspecified: Secondary | ICD-10-CM | POA: Diagnosis present

## 2021-03-19 DIAGNOSIS — Z20822 Contact with and (suspected) exposure to covid-19: Secondary | ICD-10-CM | POA: Diagnosis present

## 2021-03-19 DIAGNOSIS — M25471 Effusion, right ankle: Secondary | ICD-10-CM | POA: Diagnosis present

## 2021-03-19 DIAGNOSIS — R52 Pain, unspecified: Secondary | ICD-10-CM

## 2021-03-19 LAB — TYPE AND SCREEN
ABO/RH(D): O POS
Antibody Screen: NEGATIVE

## 2021-03-19 LAB — CBC WITH DIFFERENTIAL/PLATELET
Abs Immature Granulocytes: 0.07 10*3/uL (ref 0.00–0.07)
Basophils Absolute: 0.1 10*3/uL (ref 0.0–0.1)
Basophils Relative: 0 %
Eosinophils Absolute: 0.2 10*3/uL (ref 0.0–0.5)
Eosinophils Relative: 2 %
HCT: 42.4 % (ref 36.0–46.0)
Hemoglobin: 14 g/dL (ref 12.0–15.0)
Immature Granulocytes: 1 %
Lymphocytes Relative: 15 %
Lymphs Abs: 1.8 10*3/uL (ref 0.7–4.0)
MCH: 31.3 pg (ref 26.0–34.0)
MCHC: 33 g/dL (ref 30.0–36.0)
MCV: 94.6 fL (ref 80.0–100.0)
Monocytes Absolute: 0.6 10*3/uL (ref 0.1–1.0)
Monocytes Relative: 5 %
Neutro Abs: 9.5 10*3/uL — ABNORMAL HIGH (ref 1.7–7.7)
Neutrophils Relative %: 77 %
Platelets: 268 10*3/uL (ref 150–400)
RBC: 4.48 MIL/uL (ref 3.87–5.11)
RDW: 12.8 % (ref 11.5–15.5)
WBC: 12.2 10*3/uL — ABNORMAL HIGH (ref 4.0–10.5)
nRBC: 0 % (ref 0.0–0.2)

## 2021-03-19 LAB — COMPREHENSIVE METABOLIC PANEL
ALT: 22 U/L (ref 0–44)
AST: 28 U/L (ref 15–41)
Albumin: 3.8 g/dL (ref 3.5–5.0)
Alkaline Phosphatase: 91 U/L (ref 38–126)
Anion gap: 6 (ref 5–15)
BUN: 11 mg/dL (ref 8–23)
CO2: 27 mmol/L (ref 22–32)
Calcium: 9.8 mg/dL (ref 8.9–10.3)
Chloride: 104 mmol/L (ref 98–111)
Creatinine, Ser: 0.79 mg/dL (ref 0.44–1.00)
GFR, Estimated: >60 mL/min (ref >60)
Glucose, Bld: 103 mg/dL — ABNORMAL HIGH (ref 70–99)
Potassium: 3.6 mmol/L (ref 3.5–5.1)
Sodium: 137 mmol/L (ref 135–145)
Total Bilirubin: 0.9 mg/dL (ref 0.3–1.2)
Total Protein: 6.9 g/dL (ref 6.5–8.1)

## 2021-03-19 LAB — ETHANOL: Alcohol, Ethyl (B): <10 mg/dL (ref <10)

## 2021-03-19 LAB — RESP PANEL BY RT-PCR (FLU A&B, COVID) ARPGX2
Influenza A by PCR: NEGATIVE
Influenza B by PCR: NEGATIVE
SARS Coronavirus 2 by RT PCR: NEGATIVE

## 2021-03-19 LAB — HIV ANTIBODY (ROUTINE TESTING W REFLEX): HIV Screen 4th Generation wRfx: NONREACTIVE

## 2021-03-19 LAB — APTT: aPTT: 32 seconds (ref 24–36)

## 2021-03-19 LAB — PROTIME-INR
INR: 1.1 (ref 0.8–1.2)
Prothrombin Time: 14.1 seconds (ref 11.4–15.2)

## 2021-03-19 LAB — LITHIUM LEVEL: Lithium Lvl: 0.39 mmol/L — ABNORMAL LOW (ref 0.60–1.20)

## 2021-03-19 IMAGING — CR DG CHEST 1V
2 series · 2 of 2 positions shown · non-contrast
Comparison: [DATE]

CLINICAL DATA: Fall, right hip injury

EXAM:
CHEST  1 VIEW

[chest ap (1 of 2)]
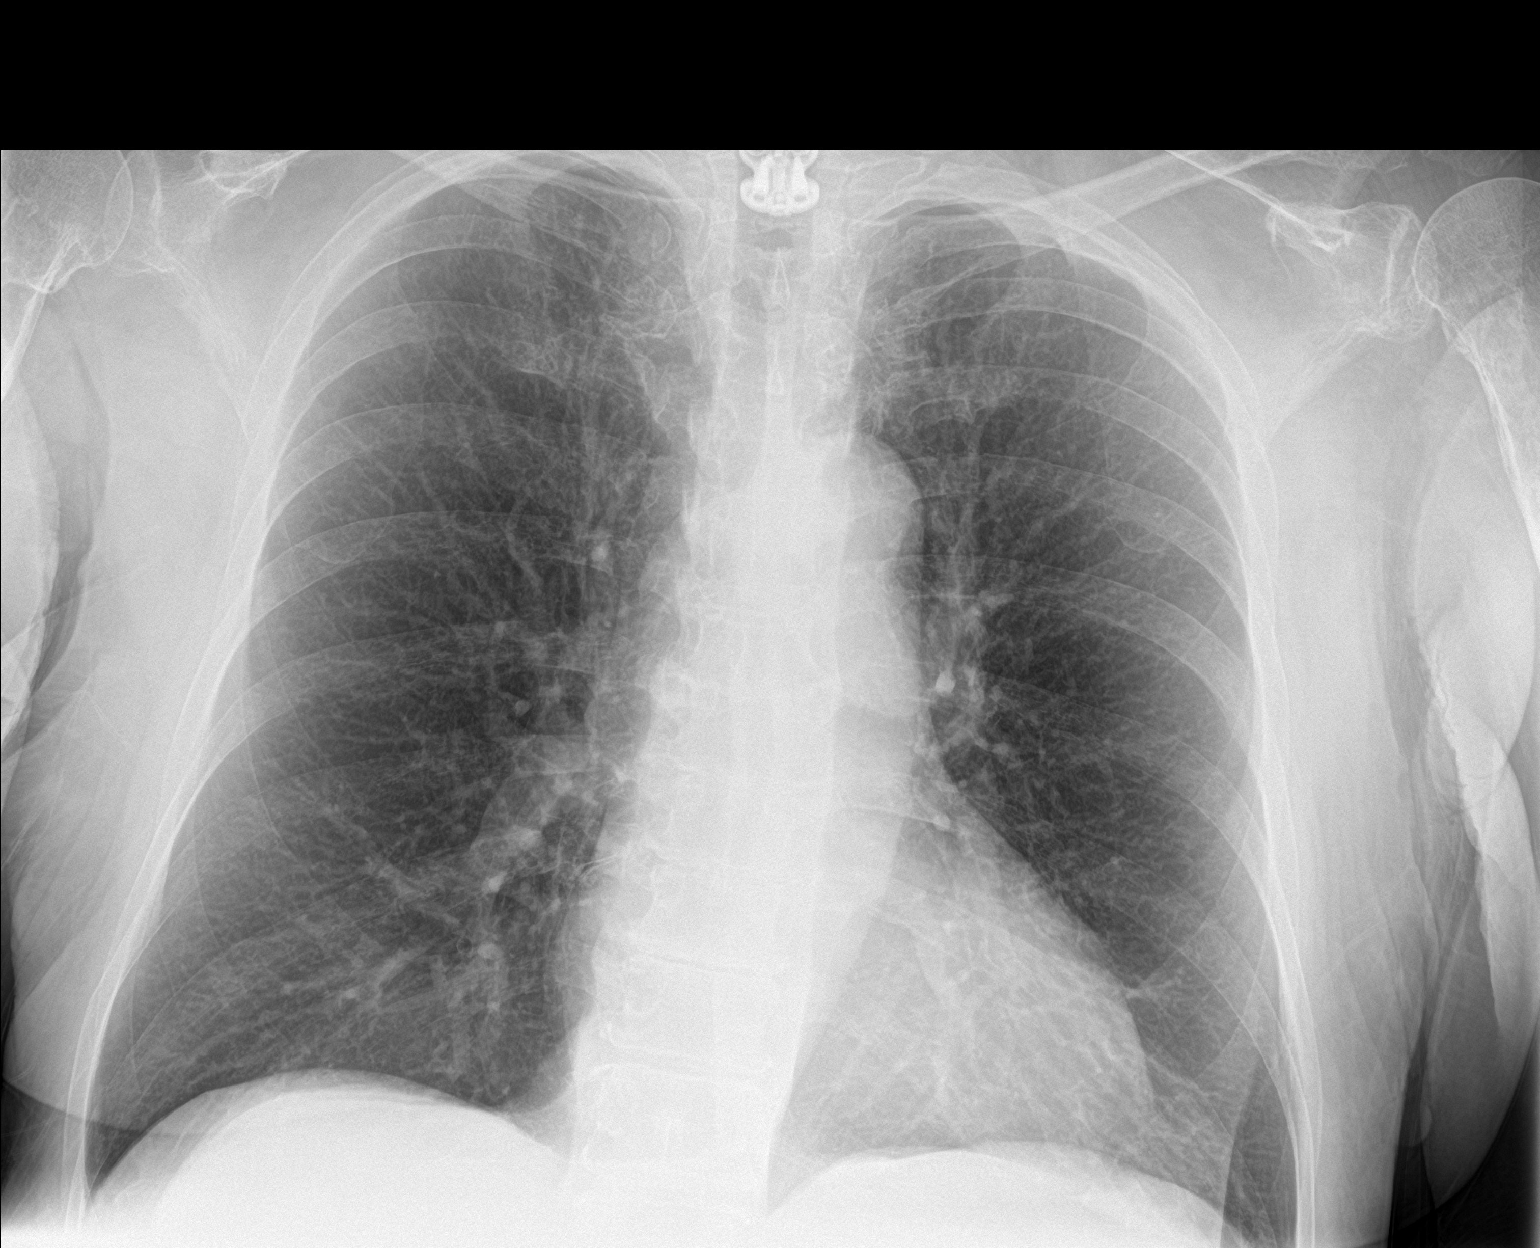

[chest ap (2 of 2)]
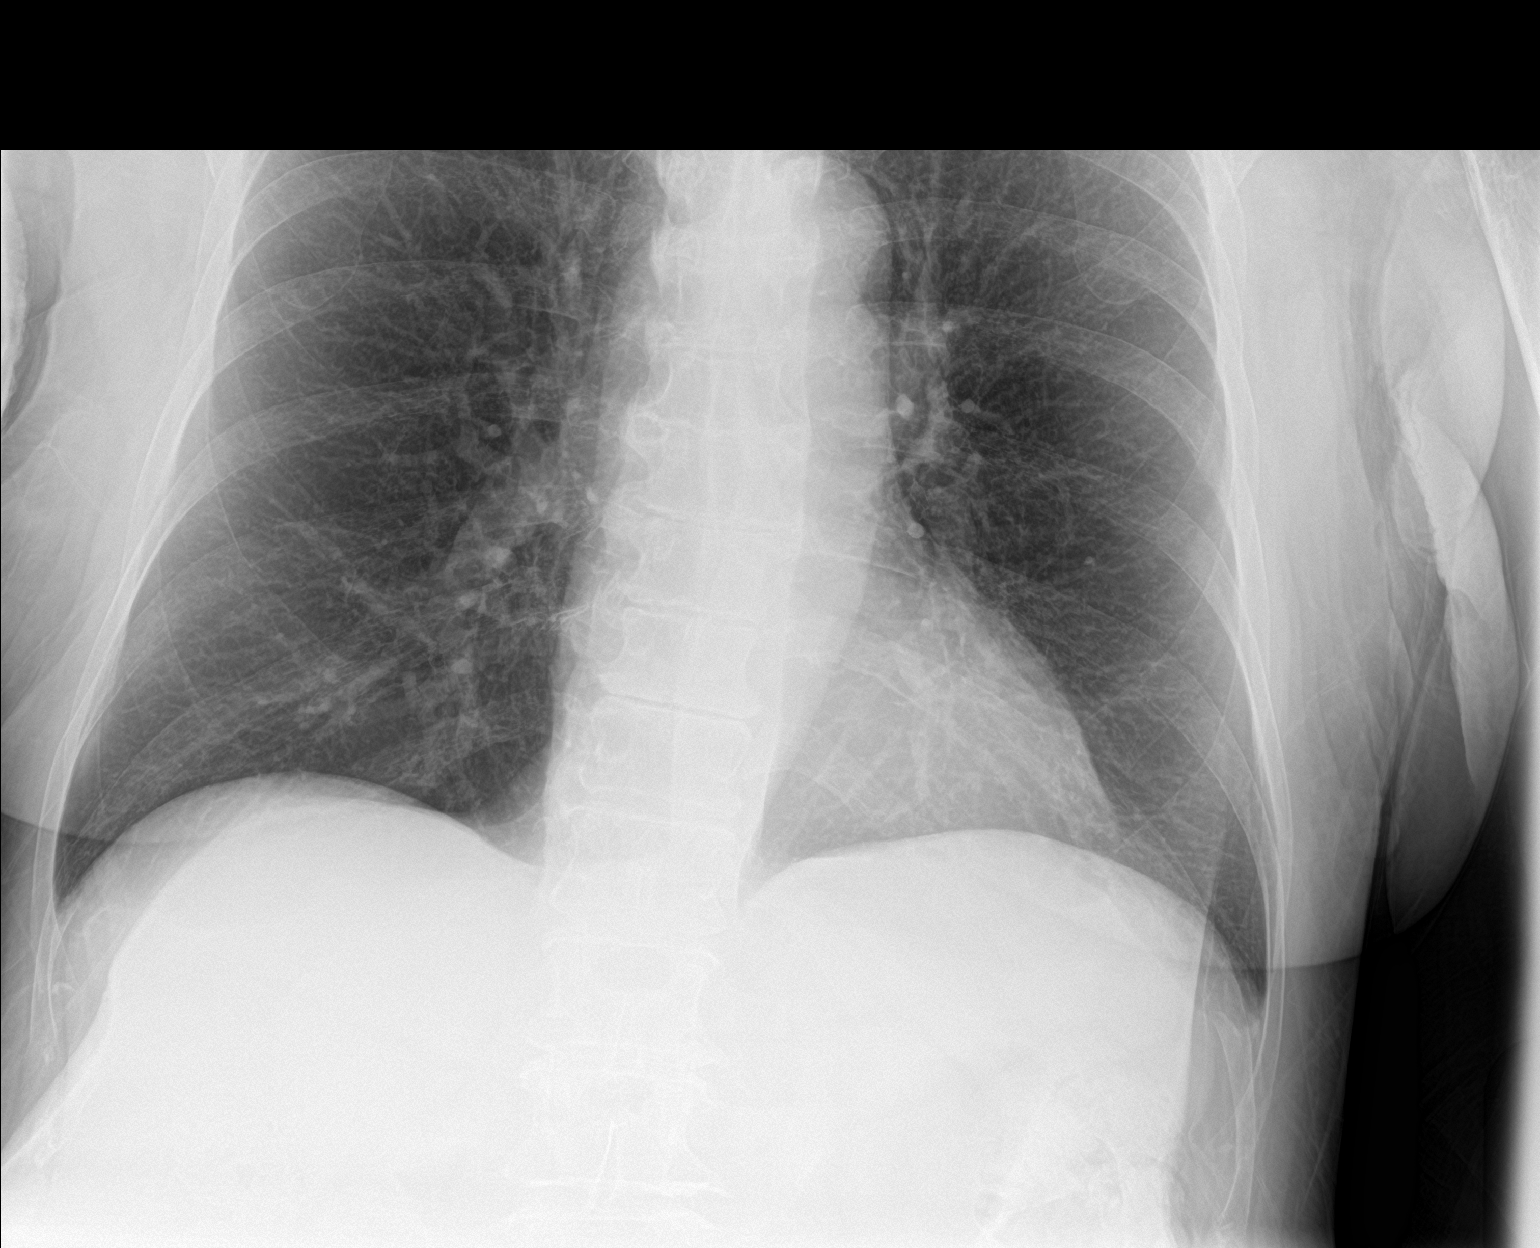

[2 of 2 positions shown; findings below may reference images not displayed]

FINDINGS: Heart and mediastinal contours are within normal limits. No focal
opacities or effusions. No acute bony abnormality.
IMPRESSION: No active disease.

## 2021-03-19 IMAGING — CR DG HIP (WITH OR WITHOUT PELVIS) 2-3V*R*
3 series · 3 of 3 positions shown · non-contrast
Comparison: None.

CLINICAL DATA: Fall, right hip injury

EXAM:
DG HIP (WITH OR WITHOUT PELVIS) 2-3V RIGHT

[pelvis ap]
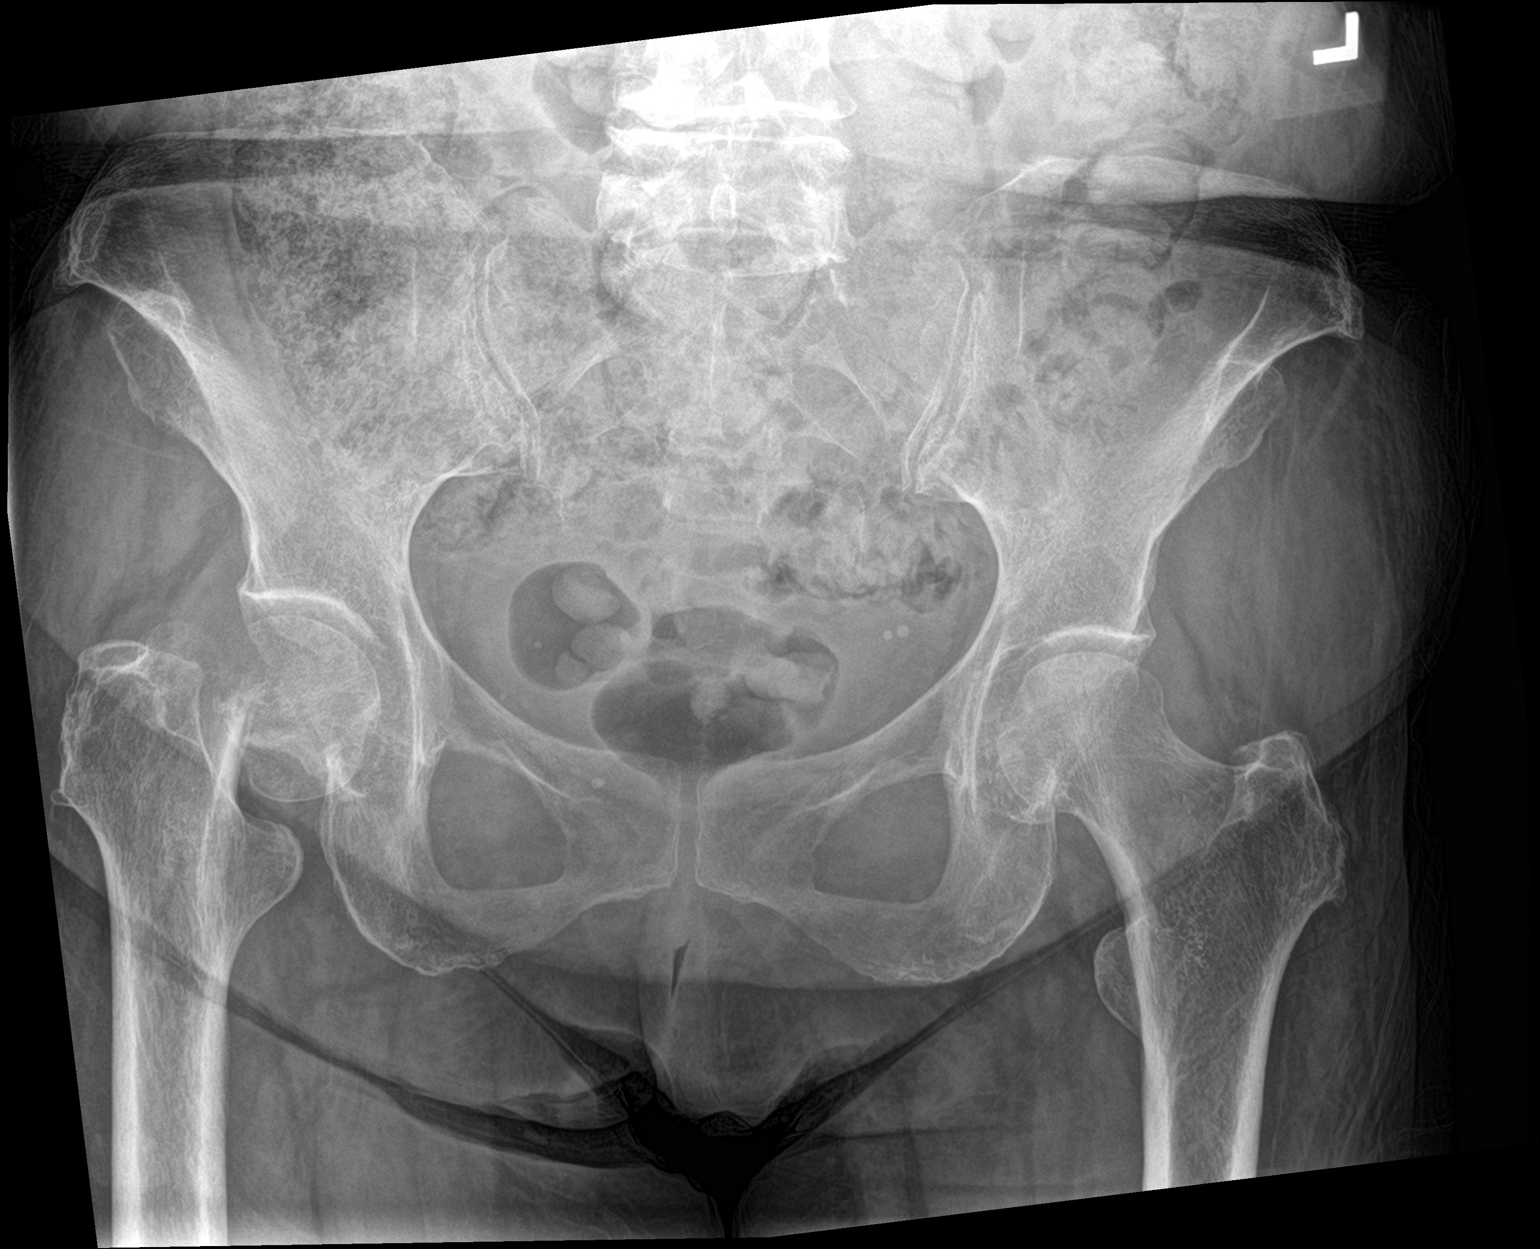

[hip ap]
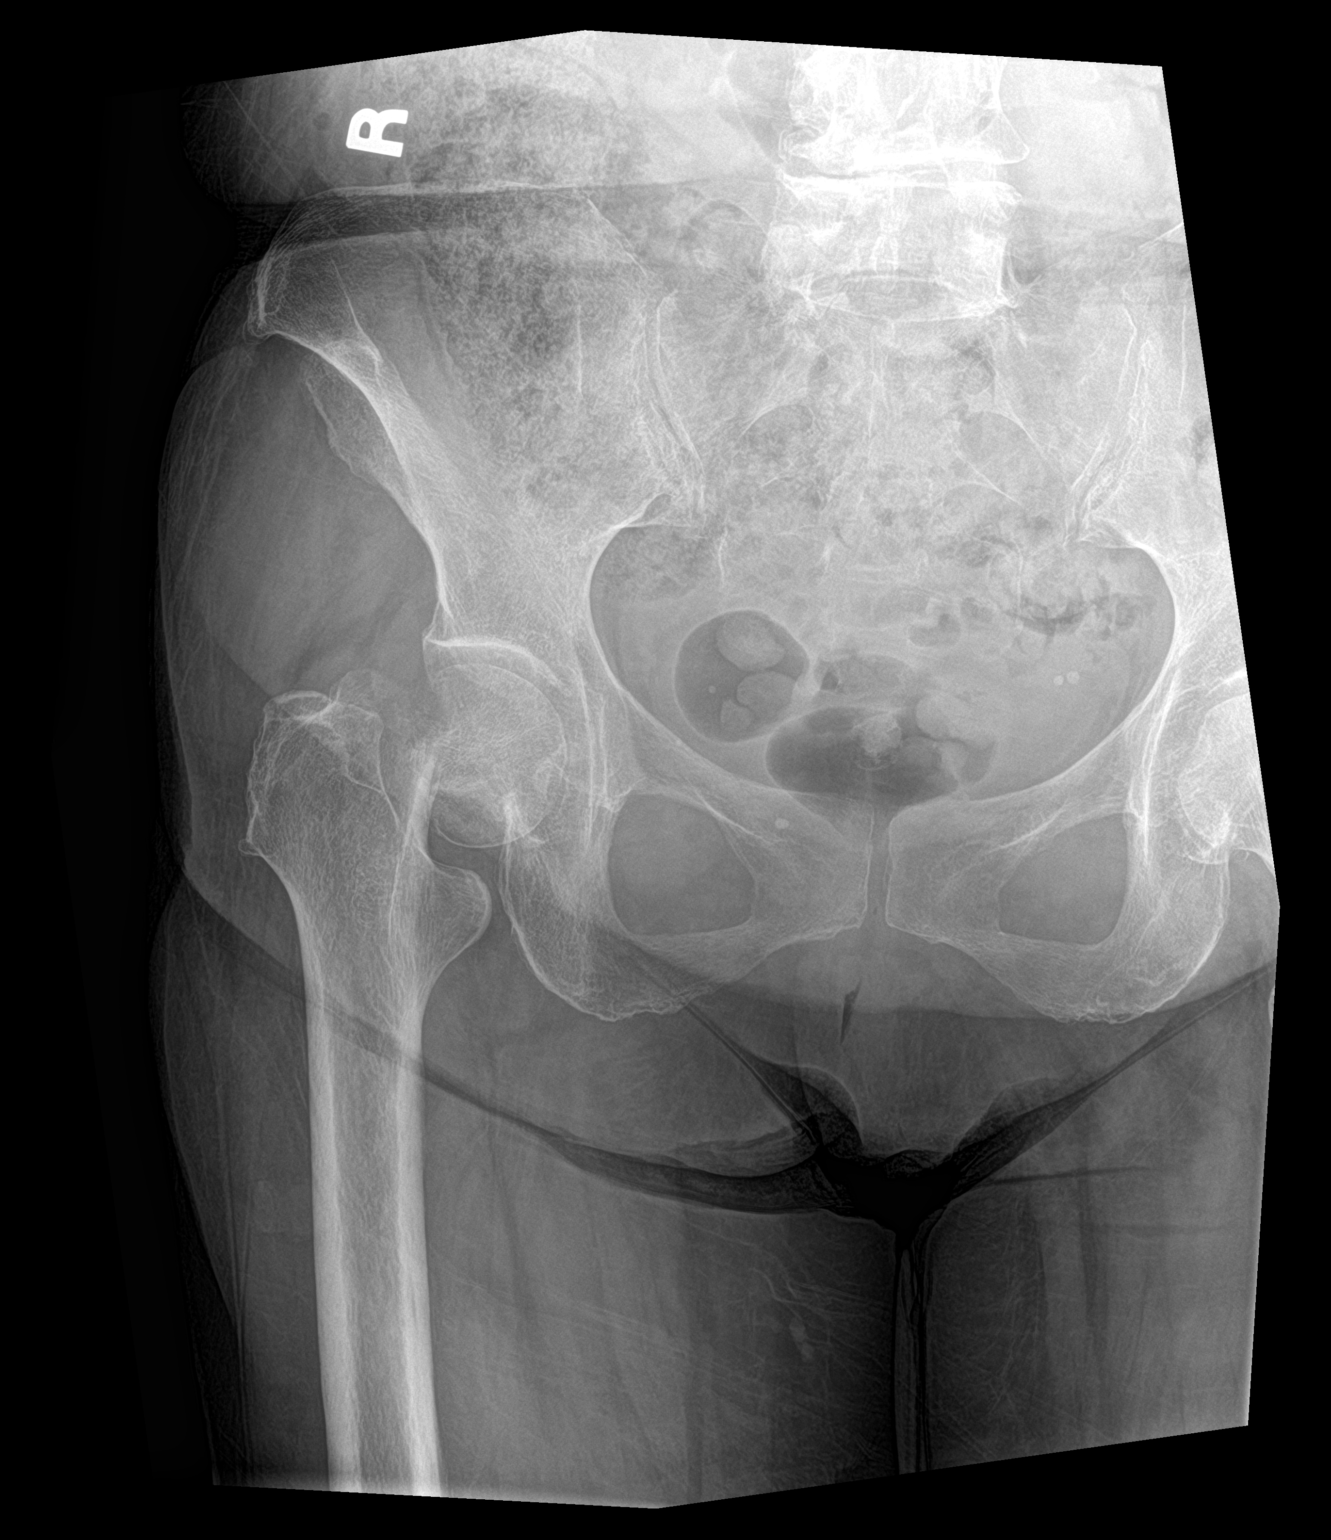

[hip lat]
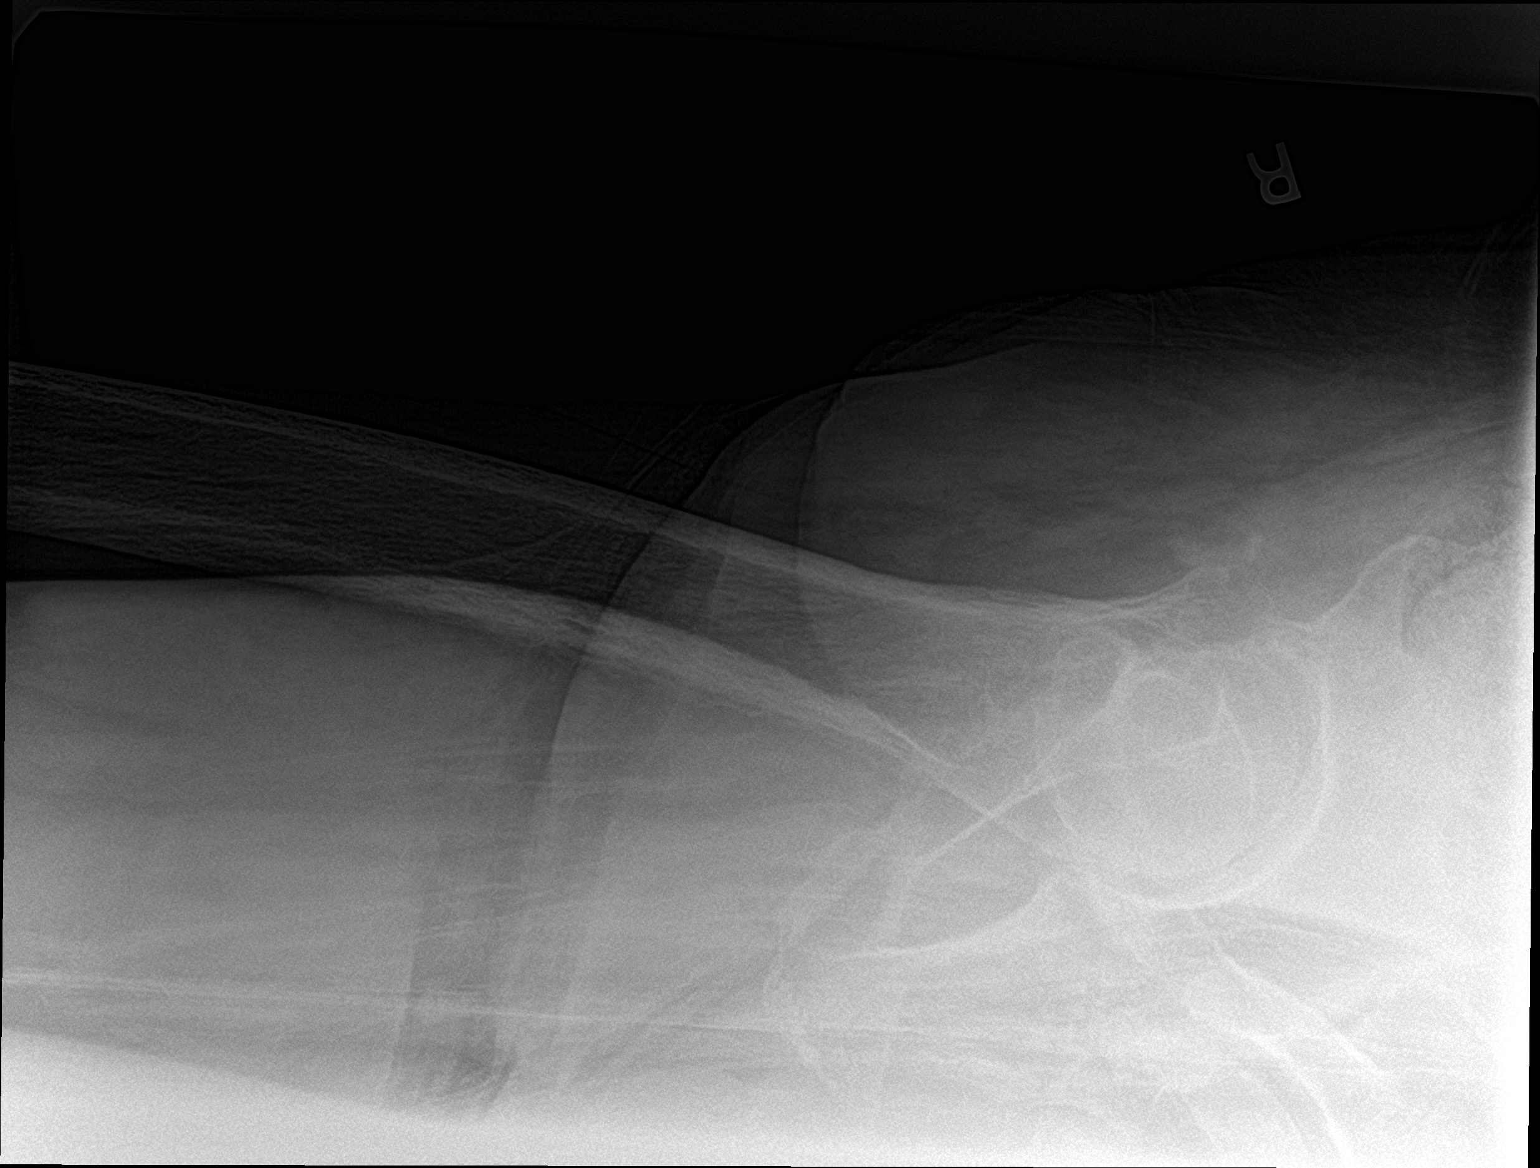

[3 of 3 positions shown; findings below may reference images not displayed]

FINDINGS: There is a right femoral neck fracture with varus angulation. No
subluxation or dislocation. Hip joints and SI joints symmetric and
unremarkable.
IMPRESSION: Right femoral neck fracture with varus angulation.

## 2021-03-19 MED ORDER — HYDROCODONE-ACETAMINOPHEN 5-325 MG PO TABS
1.0000 | ORAL_TABLET | Freq: Four times a day (QID) | ORAL | Status: DC | PRN
  Administered 2021-03-19 (×2): 2 via ORAL
  Filled 2021-03-19 (×2): qty 2

## 2021-03-19 MED ORDER — MORPHINE SULFATE (PF) 4 MG/ML IV SOLN
INTRAVENOUS | Status: AC
  Filled 2021-03-19: qty 1

## 2021-03-19 MED ORDER — MORPHINE SULFATE (PF) 4 MG/ML IV SOLN
4.0000 mg | INTRAVENOUS | Status: AC | PRN
Start: 2021-03-19 — End: 2021-03-19
  Administered 2021-03-19 (×2): 4 mg via INTRAVENOUS
  Filled 2021-03-19 (×2): qty 1

## 2021-03-19 MED ORDER — METHOCARBAMOL 500 MG PO TABS
500.0000 mg | ORAL_TABLET | Freq: Four times a day (QID) | ORAL | Status: DC | PRN
  Administered 2021-03-20 – 2021-03-23 (×3): 500 mg via ORAL
  Filled 2021-03-19 (×4): qty 1

## 2021-03-19 MED ORDER — METHOCARBAMOL 1000 MG/10ML IJ SOLN
500.0000 mg | Freq: Four times a day (QID) | INTRAVENOUS | Status: DC | PRN
  Filled 2021-03-19: qty 5

## 2021-03-19 MED ORDER — SODIUM CHLORIDE 0.9 % IV SOLN: INTRAVENOUS | Status: DC

## 2021-03-19 MED ORDER — CEFAZOLIN SODIUM-DEXTROSE 2-4 GM/100ML-% IV SOLN
2.0000 g | INTRAVENOUS | Status: AC
  Administered 2021-03-20: 2 g via INTRAVENOUS
  Filled 2021-03-19: qty 100

## 2021-03-19 MED ORDER — SODIUM CHLORIDE 0.9 % IV SOLN: Freq: Once | INTRAVENOUS | Status: AC

## 2021-03-19 MED ORDER — MORPHINE SULFATE (PF) 2 MG/ML IV SOLN
0.5000 mg | INTRAVENOUS | Status: DC | PRN
  Administered 2021-03-19 (×2): 0.5 mg via INTRAVENOUS
  Filled 2021-03-19: qty 1

## 2021-03-19 MED ORDER — ONDANSETRON HCL 4 MG/2ML IJ SOLN
4.0000 mg | Freq: Once | INTRAMUSCULAR | Status: AC
  Administered 2021-03-19: 4 mg via INTRAVENOUS
  Filled 2021-03-19: qty 2

## 2021-03-19 MED ORDER — SENNOSIDES-DOCUSATE SODIUM 8.6-50 MG PO TABS: 1.0000 | ORAL_TABLET | Freq: Every evening | ORAL | Status: DC | PRN

## 2021-03-19 NOTE — ED Notes (Signed)
T shirt, leggings, underwear, cell phone, glasses, 1 white watch, 1 white necklace with 2 charms, and 1 white ring placed into belongings bag and placed with patient.  Pt undressed into hospital gown only.

## 2021-03-19 NOTE — H&P (Signed)
History and Physical    Teresa Lang OJJ:[id number] DOB: Jan 05, 1956 DOA: 03/19/2021  PCP: Patrice Paradise, MD   Patient coming from: Home  I have personally briefly reviewed patient's old medical records in [facility]  Chief Complaint: Fall, right hip pain  HPI: Teresa Lang is a 65 y.o. female with medical history significant for COPD, hypertension, depression, chronic pain, rheumatoid arthritis and osteopenia brought in by EMS following a fall onto her right hip with difficulty getting up thereafter.  Patient states she was walking up some stairs tripped and fell onto her right hip with sudden onset sharp pain.  She denies hitting her head and had no loss of consciousness.  She was previously in her usual state of health.  Denied recent illness, no chest pain, shortness of breath, palpitations or lightheadedness.  Denied visual disturbance or headache, one-sided weakness numbness or tingling prior to the fall. ED course: On arrival, afebrile BP 131/92, pulse 92 O2 sat 90% on room air. Blood work for the most part unremarkable with mild elevation in WBC to 12,200.  Lithium level low at 0.39, EtOH less than 10. EKG, personally viewed and interpreted: Normal sinus rhythm at 94 with no acute ST-T wave changes Imaging: Chest x-ray with no active disease X-ray pelvis with right femoral neck fracture with varus angulation  The ED provider spoke with orthopedist Dr. Lahoma Crocker will take patient to the OR in the a.m.  Hospitalist consulted for admission.  Review of Systems: As per HPI otherwise all other systems on review of systems negative.    Past Medical History:  Diagnosis Date   Allergy    seasonal   Anxiety    Arthritis    Asthma    COPD (chronic obstructive pulmonary disease) (HCC)    Depression    Dyspnea    GERD (gastroesophageal reflux disease)    Hyperlipidemia    Hypertension     Past Surgical History:  Procedure Laterality Date    ABDOMINAL HYSTERECTOMY  1983   CERVICAL DISC SURGERY     CHOLECYSTECTOMY  1983   LUMBAR LAMINECTOMY/DECOMPRESSION MICRODISCECTOMY Right 11/24/2019   Procedure: OPEN L3/4 LAMINECTOMY WITH CYST REMOVAL;  Surgeon: Lucy Chris, MD;  Location: [facility] ORS;  Service: Neurosurgery;  Laterality: Right;   SPINE SURGERY  beginning 1994   laminectomies, fusions     reports that she has quit smoking. Her smoking use included cigarettes. She started smoking about 60 years ago. She has a 14.25 pack-year smoking history. She has never used smokeless tobacco. She reports previous drug use. Drug: Marijuana. She reports that she does not drink alcohol.  Allergies  Allergen Reactions   Prednisone Anxiety    Family History  Problem Relation Age of Onset   Celiac disease Sister    Breast cancer Neg Hx       Prior to Admission medications   Medication Sig Start Date End Date Taking? Authorizing Provider  albuterol (VENTOLIN HFA) 108 (90 Base) MCG/ACT inhaler Inhale 2 puffs into the lungs every 6 (six) hours as needed for wheezing or shortness of breath. 03/27/19   Clapacs, Jackquline Denmark, MD  ARIPiprazole (ABILIFY) 10 MG tablet Take 10 mg by mouth daily. 06/29/20   [provider]  busPIRone (BUSPAR) 10 MG tablet TAKE 1 TABLET BY MOUTH TWICE A DAY FOR 1 WEEK THEN TAKE 1 TABLET 3 TIMES A DAY 03/29/20   [provider]  diclofenac Sodium (VOLTAREN) 1 % GEL Apply topically 4 (four) times daily.  [provider]  diltiazem (CARDIZEM CD) 240 MG 24 hr capsule Take 240 mg by mouth daily. 08/21/19   [provider]  DULoxetine (CYMBALTA) 30 MG capsule Take by mouth.    [provider]  DULoxetine (CYMBALTA) 60 MG capsule Take 60 mg by mouth daily. 07/25/20   [provider]  folic acid (FOLVITE) 1 MG tablet Take 1 tablet (1 mg total) by mouth daily. 03/27/19   Clapacs, Jackquline Denmark, MD  hydrocortisone 2.5 % cream Apply topically. 05/28/20   [provider]  lisinopril  (ZESTRIL) 20 MG tablet Take 1 tablet (20 mg total) by mouth daily. 03/27/19   Clapacs, Jackquline Denmark, MD  lithium carbonate 300 MG capsule Take 1 capsule (300 mg total) by mouth at bedtime. Patient taking differently: Take 300 mg by mouth every evening. 03/27/19   Clapacs, Jackquline Denmark, MD  LORazepam (ATIVAN) 1 MG tablet Take 1 mg by mouth daily as needed. 07/16/20   [provider]  methotrexate (RHEUMATREX) 2.5 MG tablet Take 17.5 mg by mouth once a week. Sunday evening 10/16/18   [provider]  Multiple Vitamin (MULTIVITAMIN) tablet Take 1 tablet by mouth daily.    [provider]  olopatadine (PATANOL) 0.1 % ophthalmic solution Place 1 drop into both eyes 2 (two) times daily. 03/27/19   Clapacs, Jackquline Denmark, MD  omeprazole (PRILOSEC) 40 MG capsule Take 40 mg by mouth daily as needed (Reflux).    [provider]  potassium chloride (KLOR-CON) 10 MEQ tablet Take 10 mEq by mouth daily. 11/07/19   [provider]  solifenacin (VESICARE) 5 MG tablet Take 1 tablet (5 mg total) by mouth daily. 11/29/20   MacDiarmid, Lorin Picket, MD  TRELEGY ELLIPTA 100-62.5-25 MCG/INH AEPB Inhale 2 puffs into the lungs daily. 05/22/19   [provider]  umeclidinium bromide (INCRUSE ELLIPTA) 62.5 MCG/INH AEPB Inhale 1 puff into the lungs daily. 03/27/19   Clapacs, Jackquline Denmark, MD  valACYclovir (VALTREX) 500 MG tablet Take 500 mg by mouth 2 (two) times daily. 07/15/20   [provider]    Physical Exam: Vitals:   03/19/21 0038 03/19/21 0039  BP:  (!) 131/92  Pulse:  92  Resp:  18  Temp:  98.1 F (36.7 C)  TempSrc:  Oral  SpO2:  90%  Weight: 71.7 kg   Height:  (1.651 m)      Vitals:   03/19/21 0038 03/19/21 0039  BP:  (!) 131/92  Pulse:  92  Resp:  18  Temp:  98.1 F (36.7 C)  TempSrc:  Oral  SpO2:  90%  Weight: 71.7 kg   Height:  (1.651 m)       Constitutional: Alert and oriented x 3 . Not in any apparent distress HEENT:      Head: Normocephalic and  atraumatic.         Eyes: PERLA, EOMI, Conjunctivae are normal. Sclera is non-icteric.       Mouth/Throat: Mucous membranes are moist.       Neck: Supple with no signs of meningismus. Cardiovascular: Regular rate and rhythm. No murmurs, gallops, or rubs. 2+ symmetrical distal pulses are present . No JVD. No LE edema Respiratory: Respiratory effort normal .Lungs sounds clear bilaterally. No wheezes, crackles, or rhonchi.  Gastrointestinal: Soft, non tender, and non distended with positive bowel sounds.  Genitourinary: No CVA tenderness. Musculoskeletal: Right leg shortening and external rotation.  Neurovascularly intact. No cyanosis, or erythema of extremities. Neurologic:  Face is symmetric.  Moving all extremities. No gross focal neurologic deficits . Skin: Skin is warm, dry.  No rash or ulcers Psychiatric: Mood and affect are norma    Labs on Admission: I have personally reviewed following labs and imaging studies  CBC: Recent Labs  Lab 03/19/21 0130  WBC 12.2*  NEUTROABS 9.5*  HGB 14.0  HCT 42.4  MCV 94.6  PLT 268   Basic Metabolic Panel: Recent Labs  Lab 03/19/21 0130  NA 137  K 3.6  CL 104  CO2 27  GLUCOSE 103*  BUN 11  CREATININE 0.79  CALCIUM 9.8   GFR: Estimated Creatinine Clearance: 68.7 mL/min (by C-G formula based on SCr of 0.79 mg/dL). Liver Function Tests: Recent Labs  Lab 03/19/21 0130  AST 28  ALT 22  ALKPHOS 91  BILITOT 0.9  PROT 6.9  ALBUMIN 3.8   No results for input(s): LIPASE, AMYLASE in the last 168 hours. No results for input(s): AMMONIA in the last 168 hours. Coagulation Profile: Recent Labs  Lab 03/19/21 0130  INR 1.1   Cardiac Enzymes: No results for input(s): CKTOTAL, CKMB, CKMBINDEX, TROPONINI in the last 168 hours. BNP (last 3 results) No results for input(s): PROBNP in the last 8760 hours. HbA1C: No results for input(s): HGBA1C in the last 72 hours. CBG: No results for input(s): GLUCAP in the last 168 hours. Lipid  Profile: No results for input(s): CHOL, HDL, LDLCALC, TRIG, CHOLHDL, LDLDIRECT in the last 72 hours. Thyroid Function Tests: No results for input(s): TSH, T4TOTAL, FREET4, T3FREE, THYROIDAB in the last 72 hours. Anemia Panel: No results for input(s): VITAMINB12, FOLATE, FERRITIN, TIBC, IRON, RETICCTPCT in the last 72 hours. Urine analysis:    Component Value Date/Time   COLORURINE AMBER (A) 11/20/2019 0804   APPEARANCEUR Cloudy (A) 09/06/2020 0758   LABSPEC 1.016 11/20/2019 0804   PHURINE 7.0 11/20/2019 0804   GLUCOSEU Negative 09/06/2020 0758   HGBUR NEGATIVE 11/20/2019 0804   BILIRUBINUR Negative 09/06/2020 0758   KETONESUR NEGATIVE 11/20/2019 0804   PROTEINUR Negative 09/06/2020 0758   PROTEINUR 30 (A) 11/20/2019 0804   NITRITE Negative 09/06/2020 0758   NITRITE NEGATIVE 11/20/2019 0804   LEUKOCYTESUR 1+ (A) 09/06/2020 0758   LEUKOCYTESUR NEGATIVE 11/20/2019 0804    Radiological Exams on Admission: DG Chest 1 View  Result Date: 03/19/2021 CLINICAL DATA:  Fall, right hip injury EXAM: CHEST  1 VIEW COMPARISON:  11/15/2018 FINDINGS: Heart and mediastinal contours are within normal limits. No focal opacities or effusions. No acute bony abnormality. IMPRESSION: No active disease. Electronically Signed   By: Charlett Nose M.D.   On: 03/19/2021 01:29   DG Hip Unilat W or Wo Pelvis 2-3 Views Right  Result Date: 03/19/2021 CLINICAL DATA:  Fall, right hip injury EXAM: DG HIP (WITH OR WITHOUT PELVIS) 2-3V RIGHT COMPARISON:  None. FINDINGS: There is a right femoral neck fracture with varus angulation. No subluxation or dislocation. Hip joints and SI joints symmetric and unremarkable. IMPRESSION: Right femoral neck fracture with varus angulation. Electronically Signed   By: Charlett Nose M.D.   On: 03/19/2021 01:26     Assessment/Plan 65 year old female with history of COPD, hypertension, depression, chronic pain, rheumatoid arthritis and osteopenia presenting with right hip fracture from an  accidental fall.    Right femoral neck fracture secondary to an accidental fall Preoperative clearance Accidental fall - Patient tripped and fell while walking upstairs.  No evidence of other injury and no preceding illness - Patient with low risk for perioperative cardiopulmonary events and can  proceed with orthopedic repair - Pain control -Dr. Joice Lofts aware and will take patient to OR in the am    Hypertension, essential - Continue diltiazem    Rheumatoid arthritis (HCC) - Continue methotrexate    Chronic obstructive pulmonary disease, unspecified (HCC) - DuoNebs as needed    Depression -Continue Cymbalta, BuSpar, lorazepam and lithium pending med rec    DVT prophylaxis: SCDs code Status: full code  Family Communication:  none  Disposition Plan: Back to previous home environment Consults called: Orthopedics Status:At the time of admission, it appears that the appropriate admission status for this patient is INPATIENT. This is judged to be reasonable and necessary in order to provide the required intensity of service to ensure the patient's safety given the presenting symptoms, physical exam findings, and initial radiographic and laboratory data in the context of their  Comorbid conditions.   Patient requires inpatient status due to high intensity of service, high risk for further deterioration and high frequency of surveillance required.   I certify that at the point of admission it is my clinical judgment that the patient will require inpatient hospital care spanning beyond 2 midnights     Andris Baumann MD [facility]     03/19/2021, 2:45 AM

## 2021-03-19 NOTE — Consult Note (Signed)
ORTHOPAEDIC CONSULTATION  REQUESTING PHYSICIAN: Delfino Lovett, MD  Chief Complaint:   Right hip pain.  History of Present Illness: Teresa Lang is a 65 y.o. female with multiple medical problems including hypertension, hyperlipidemia, gastroesophageal reflux disease, COPD, asthma, anxiety/depression, chronic pain syndrome, and osteopenia who lives independently with a family member and is able to ambulate without assistive devices.  The patient was in her usual state of health yesterday when she apparently turned to go up some steps.  Her foot got caught and she fell onto her right side, injuring her right hip.  She was brought to the emergency room where x-rays demonstrated a varus displaced fracture of the right femoral neck.  The patient denies any associated injuries.  She did not strike her head or lose consciousness.  The patient also denies any lightheadedness, dizziness, chest pain, shortness of breath, or other symptoms which may have precipitated her fall.  She has been admitted to the hospital service in order to optimize her medically in preparation for definitive management of her right hip fracture.  Past Medical History:  Diagnosis Date   Allergy    seasonal   Anxiety    Arthritis    Asthma    COPD (chronic obstructive pulmonary disease) (HCC)    Depression    Dyspnea    GERD (gastroesophageal reflux disease)    Hyperlipidemia    Hypertension    Past Surgical History:  Procedure Laterality Date   ABDOMINAL HYSTERECTOMY  1983   CERVICAL DISC SURGERY     CHOLECYSTECTOMY  1983   LUMBAR LAMINECTOMY/DECOMPRESSION MICRODISCECTOMY Right 11/24/2019   Procedure: OPEN L3/4 LAMINECTOMY WITH CYST REMOVAL;  Surgeon: Lucy Chris, MD;  Location: [facility] ORS;  Service: Neurosurgery;  Laterality: Right;   SPINE SURGERY  beginning 1994   laminectomies, fusions   Social History   Socioeconomic History   Marital  status: Widowed    Spouse name: Not on file   Number of children: Not on file   Years of education: Not on file   Highest education level: Not on file  Occupational History   Occupation: disability  Tobacco Use   Smoking status: Former    Packs/day: 0.25    Years: 57.00    Pack years: 14.25    Types: Cigarettes    Start date: 1962   Smokeless tobacco: Never   Tobacco comments:    Also VAPES on a daily basis  Vaping Use   Vaping Use: Every day  Substance and Sexual Activity   Alcohol use: Never   Drug use: Not Currently    Types: Marijuana   Sexual activity: Not on file  Other Topics Concern   Not on file  Social History Narrative   Lives with sister   Social Determinants of Health   Financial Resource Strain: Not on file  Food Insecurity: Not on file  Transportation Needs: Not on file  Physical Activity: Not on file  Stress: Not on file  Social Connections: Not on file   Family History  Problem Relation Age of Onset   Celiac disease Sister    Breast cancer Neg Hx    Allergies  Allergen Reactions   Prednisone Anxiety   Prior to Admission medications   Medication Sig Start Date End Date Taking? Authorizing Provider  albuterol (VENTOLIN HFA) 108 (90 Base) MCG/ACT inhaler Inhale 2 puffs into the lungs every 6 (six) hours as needed for wheezing or shortness of breath. 03/27/19   Clapacs, Jackquline Denmark, MD  ARIPiprazole (ABILIFY)  10 MG tablet Take 10 mg by mouth daily. 06/29/20   [provider]  ARIPiprazole (ABILIFY) 15 MG tablet Take 15 mg by mouth daily. 02/23/21   [provider]  atorvastatin (LIPITOR) 20 MG tablet Take 20 mg by mouth daily. 01/31/21   [provider]  busPIRone (BUSPAR) 10 MG tablet TAKE 1 TABLET BY MOUTH TWICE A DAY FOR 1 WEEK THEN TAKE 1 TABLET 3 TIMES A DAY 03/29/20   [provider]  diclofenac Sodium (VOLTAREN) 1 % GEL Apply topically 4 (four) times daily.    [provider]  diltiazem (CARDIZEM CD) 240 MG  24 hr capsule Take 240 mg by mouth daily. 08/21/19   [provider]  DULoxetine (CYMBALTA) 30 MG capsule Take by mouth.    [provider]  DULoxetine (CYMBALTA) 60 MG capsule Take 60 mg by mouth daily. 07/25/20   [provider]  DULoxetine (CYMBALTA) 60 MG capsule Take 120 mg by mouth daily. 01/04/21   [provider]  folic acid (FOLVITE) 1 MG tablet Take 1 tablet (1 mg total) by mouth daily. 03/27/19   Clapacs, Jackquline Denmark, MD  hydrocortisone 2.5 % cream Apply topically. 05/28/20   [provider]  lisinopril (ZESTRIL) 20 MG tablet Take 1 tablet (20 mg total) by mouth daily. 03/27/19   Clapacs, Jackquline Denmark, MD  lithium carbonate (LITHOBID) 300 MG CR tablet Take 300 mg by mouth daily as needed. 02/23/21   [provider]  lithium carbonate 300 MG capsule Take 1 capsule (300 mg total) by mouth at bedtime. Patient taking differently: Take 300 mg by mouth every evening. 03/27/19   Clapacs, Jackquline Denmark, MD  LORazepam (ATIVAN) 1 MG tablet Take 1 mg by mouth daily as needed. 07/16/20   [provider]  methotrexate (RHEUMATREX) 2.5 MG tablet Take 17.5 mg by mouth once a week. Sunday evening 10/16/18   [provider]  Multiple Vitamin (MULTIVITAMIN) tablet Take 1 tablet by mouth daily.    [provider]  olopatadine (PATANOL) 0.1 % ophthalmic solution Place 1 drop into both eyes 2 (two) times daily. 03/27/19   Clapacs, Jackquline Denmark, MD  omeprazole (PRILOSEC) 40 MG capsule Take 40 mg by mouth daily as needed (Reflux).    [provider]  oxybutynin (DITROPAN-XL) 10 MG 24 hr tablet Take 10 mg by mouth at bedtime. 02/15/21   [provider]  potassium chloride (KLOR-CON) 10 MEQ tablet Take 10 mEq by mouth daily. 11/07/19   [provider]  solifenacin (VESICARE) 5 MG tablet Take 1 tablet (5 mg total) by mouth daily. 11/29/20   MacDiarmid, Lorin Picket, MD  TRELEGY ELLIPTA 100-62.5-25 MCG/INH AEPB Inhale 2 puffs into the lungs daily.  05/22/19   [provider]  umeclidinium bromide (INCRUSE ELLIPTA) 62.5 MCG/INH AEPB Inhale 1 puff into the lungs daily. 03/27/19   Clapacs, Jackquline Denmark, MD  valACYclovir (VALTREX) 500 MG tablet Take 500 mg by mouth 2 (two) times daily. 07/15/20   [provider]   DG Chest 1 View  Result Date: 03/19/2021 CLINICAL DATA:  Fall, right hip injury EXAM: CHEST  1 VIEW COMPARISON:  11/15/2018 FINDINGS: Heart and mediastinal contours are within normal limits. No focal opacities or effusions. No acute bony abnormality. IMPRESSION: No active disease. Electronically Signed   By: Charlett Nose M.D.   On: 03/19/2021 01:29   DG Hip Unilat W or Wo Pelvis 2-3 Views Right  Result Date: 03/19/2021 CLINICAL DATA:  Fall, right hip injury EXAM:  DG HIP (WITH OR WITHOUT PELVIS) 2-3V RIGHT COMPARISON:  None. FINDINGS: There is a right femoral neck fracture with varus angulation. No subluxation or dislocation. Hip joints and SI joints symmetric and unremarkable. IMPRESSION: Right femoral neck fracture with varus angulation. Electronically Signed   By: Charlett Nose M.D.   On: 03/19/2021 01:26    Positive ROS: All other systems have been reviewed and were otherwise negative with the exception of those mentioned in the HPI and as above.  Physical Exam: General:  Alert, no acute distress Psychiatric:  Patient is competent for consent with normal mood and affect   Cardiovascular:  No pedal edema Respiratory:  No wheezing, non-labored breathing GI:  Abdomen is soft and non-tender Skin:  No lesions in the area of chief complaint Neurologic:  Sensation intact distally Lymphatic:  No axillary or cervical lymphadenopathy  Orthopedic Exam:  Orthopedic examination is limited to the right hip and lower extremity.  The right lower extremity is somewhat shortened and externally rotated as compared to the left.  Skin inspection of the right hip is unremarkable.  No swelling, erythema, ecchymosis, abrasions, or other skin  abnormalities are identified.  She has mild tenderness to palpation of the lateral aspect of the right hip.  She experiences more severe pain with any attempted active or passive motion of the right hip.  She is neurovascularly intact to the right lower extremity and foot, demonstrating ability to active dorsiflex and plantarflex her toes and ankle.  Sensation is intact to light touch to all distributions.  She has good capillary refill to her right foot.  X-rays:  X-rays of the pelvis and right hip are available for review and have been reviewed by myself.  These films demonstrate a varus displaced right femoral neck fracture.  There also is evidence of significant generalized osteopenia.  No significant degenerative changes of the right hip are noted.  No lytic lesions or other acute bony abnormalities are identified.  Assessment: Displaced right femoral neck fracture.  Plan: The treatment options, including both surgical and nonsurgical choices, have been discussed in detail with the patient. The patient would like to proceed with surgical intervention to include a right hip hemiarthroplasty. The risks (including bleeding, infection, nerve and/or blood vessel injury, persistent or recurrent pain, loosening or failure of the components, leg length inequality, dislocation, need for further surgery, blood clots, strokes, heart attacks or arrhythmias, pneumonia, etc.) and benefits of the surgical procedure were discussed. The patient states his/her understanding and agrees to proceed.  She agrees to a blood transfusion if necessary. A formal written consent will be obtained by the nursing staff.  Thank you for asking me to participate in the care of this pleasant yet unfortunate woman.  I will be happy to follow her with you.   Maryagnes Amos, MD  Beeper #:  [phone]  03/19/2021 8:05 AM

## 2021-03-19 NOTE — ED Triage Notes (Signed)
Per ems pt tripped and fell injuring right hip. Not able to bear weight per ems, possible shortening and rotation per ems.

## 2021-03-19 NOTE — Plan of Care (Signed)
New admission surgery tomorrow

## 2021-03-19 NOTE — Progress Notes (Signed)
The pt's sister, room mate and support system voices concerns to RN about the pt's opioid addiction. 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 The sister distributes her medications to her otherwise she will abuse the medication. The pt has been "good" for about 8-9 months and she knows she will need pain medication but could we please be aware and cautious. "I don't want to go through that again"

## 2021-03-19 NOTE — Progress Notes (Signed)
Same day rounding progress note  Patient seen and examined in the PACU.  Please see dictated history and physical by Dr. Para March for further details.  I agree with her plans.  Preop medical clearance Patient complained of intermittent chest pain and her surgery is cancel as anesthesia requesting cardiac clearance. Dr. Gwen Pounds is aware Patient reports remote history of cardiac cath but no recall of getting any stents She denies any active chest pain now Patient remains medically clear.  Await cardiology clearance. Obtain 2D echo and twelve-lead EKG for now Discussed with patient's sister who is at bedside in PACU Dr. Poggi/orthopedic is aware and planning for surgery tomorrow pending cardiac clearance  Time spent: 15 minutes

## 2021-03-19 NOTE — ED Provider Notes (Signed)
[facility] Emergency Department Provider Note  ____________________________________________   Event Date/Time   First MD Initiated Contact with Patient 03/19/21 0126     (approximate)  I have reviewed the triage vital signs and the nursing notes.   HISTORY  Chief Complaint Fall    HPI Teresa Lang is a 65 y.o. female with medical and psychiatric history as listed below who presents by EMS after a fall for evaluation of cute onset and severe pain in her right hip.  She said that she was at a neighbor's house and tripped walking up some stairs and landed on her right hip.  There is a cute onset sharp and severe pain.  It is worse with any amount of movement but also at least mild and possibly moderate at rest.  No numbness nor tingling.  She can move her toes.  No other injuries.  She denies headache, neck pain, chest pain, nausea, vomiting, and abdominal pain.  She started feel little bit short of breath on the way to the hospital but said she has been in her usual state of health before this incident.       Past Medical History:  Diagnosis Date   Allergy    seasonal   Anxiety    Arthritis    Asthma    COPD (chronic obstructive pulmonary disease) (HCC)    Depression    Dyspnea    GERD (gastroesophageal reflux disease)    Hyperlipidemia    Hypertension     Patient Active Problem List   Diagnosis Date Noted   Chronic obstructive pulmonary disease, unspecified (HCC) 03/19/2021   Fracture of femoral neck, right (HCC) 03/19/2021   Preoperative clearance 03/19/2021   Accidental fall 03/19/2021   Depression 03/19/2021   Closed right hip fracture (HCC) 03/19/2021   Osteopenia of multiple sites 04/22/2020   S/P lumbar discectomy 11/24/2019   Alcohol abuse 03/21/2019   MDD (major depressive disorder), recurrent, severe, with psychosis (HCC) 03/20/2019   Angina at rest [facility]) 12/08/2018   MDD (major depressive disorder), recurrent severe,  without psychosis (HCC) 11/14/2018   Anxiety 11/14/2018   Tobacco use disorder 10/16/2018   Chest pain at rest 04/15/2018   Myofascial pain dysfunction syndrome 03/21/2018   Cervico-occipital neuralgia of right side 03/21/2018   Cervical post-laminectomy syndrome 02/18/2018   Lumbar spondylosis 02/18/2018   Encounter for long-term (current) use of high-risk medication 02/15/2018   Chronic upper back pain (Primary Area of Pain) (right) 01/02/2018   Occipital neuralgia of right side (Secondary Area of Pain) 01/02/2018   Chronic neck pain (Tertiary Area of Pain)(R) 01/02/2018   Chronic bilateral low back pain without sciatica (Fourth Area of Pain) 01/02/2018   Chronic generalized pain 01/02/2018   Chronic pain syndrome 01/02/2018   Long term current use of opiate analgesic 01/02/2018   Pharmacologic therapy 01/02/2018   Disorder of skeletal system 01/02/2018   Problems influencing health status 01/02/2018   PTSD (post-traumatic stress disorder) 12/14/2017   Fibromyalgia 12/14/2017   Hyperlipemia, mixed 12/14/2017   Hypertension, essential 12/14/2017   Major depressive disorder, recurrent episode, moderate degree (HCC) 12/14/2017   Pain of cervical spine 12/14/2017   Rheumatoid arthritis (HCC) 12/14/2017   Rheumatoid arthritis involving multiple sites with positive rheumatoid factor (HCC) 12/14/2017    Past Surgical History:  Procedure Laterality Date   ABDOMINAL HYSTERECTOMY  1983   CERVICAL DISC SURGERY     CHOLECYSTECTOMY  1983   LUMBAR LAMINECTOMY/DECOMPRESSION MICRODISCECTOMY Right 11/24/2019   Procedure:  OPEN L3/4 LAMINECTOMY WITH CYST REMOVAL;  Surgeon: Lucy Chris, MD;  Location: [facility] ORS;  Service: Neurosurgery;  Laterality: Right;   SPINE SURGERY  beginning 1994   laminectomies, fusions    Prior to Admission medications   Medication Sig Start Date End Date Taking? Authorizing Provider  albuterol (VENTOLIN HFA) 108 (90 Base) MCG/ACT inhaler Inhale 2 puffs into the lungs  every 6 (six) hours as needed for wheezing or shortness of breath. 03/27/19   Clapacs, Jackquline Denmark, MD  ARIPiprazole (ABILIFY) 10 MG tablet Take 10 mg by mouth daily. 06/29/20   [provider]  busPIRone (BUSPAR) 10 MG tablet TAKE 1 TABLET BY MOUTH TWICE A DAY FOR 1 WEEK THEN TAKE 1 TABLET 3 TIMES A DAY 03/29/20   [provider]  diclofenac Sodium (VOLTAREN) 1 % GEL Apply topically 4 (four) times daily.    [provider]  diltiazem (CARDIZEM CD) 240 MG 24 hr capsule Take 240 mg by mouth daily. 08/21/19   [provider]  DULoxetine (CYMBALTA) 30 MG capsule Take by mouth.    [provider]  DULoxetine (CYMBALTA) 60 MG capsule Take 60 mg by mouth daily. 07/25/20   [provider]  folic acid (FOLVITE) 1 MG tablet Take 1 tablet (1 mg total) by mouth daily. 03/27/19   Clapacs, Jackquline Denmark, MD  hydrocortisone 2.5 % cream Apply topically. 05/28/20   [provider]  lisinopril (ZESTRIL) 20 MG tablet Take 1 tablet (20 mg total) by mouth daily. 03/27/19   Clapacs, Jackquline Denmark, MD  lithium carbonate 300 MG capsule Take 1 capsule (300 mg total) by mouth at bedtime. Patient taking differently: Take 300 mg by mouth every evening. 03/27/19   Clapacs, Jackquline Denmark, MD  LORazepam (ATIVAN) 1 MG tablet Take 1 mg by mouth daily as needed. 07/16/20   [provider]  methotrexate (RHEUMATREX) 2.5 MG tablet Take 17.5 mg by mouth once a week. Sunday evening 10/16/18   [provider]  Multiple Vitamin (MULTIVITAMIN) tablet Take 1 tablet by mouth daily.    [provider]  olopatadine (PATANOL) 0.1 % ophthalmic solution Place 1 drop into both eyes 2 (two) times daily. 03/27/19   Clapacs, Jackquline Denmark, MD  omeprazole (PRILOSEC) 40 MG capsule Take 40 mg by mouth daily as needed (Reflux).    [provider]  potassium chloride (KLOR-CON) 10 MEQ tablet Take 10 mEq by mouth daily. 11/07/19   [provider]  solifenacin (VESICARE) 5 MG tablet Take 1  tablet (5 mg total) by mouth daily. 11/29/20   MacDiarmid, Lorin Picket, MD  TRELEGY ELLIPTA 100-62.5-25 MCG/INH AEPB Inhale 2 puffs into the lungs daily. 05/22/19   [provider]  umeclidinium bromide (INCRUSE ELLIPTA) 62.5 MCG/INH AEPB Inhale 1 puff into the lungs daily. 03/27/19   Clapacs, Jackquline Denmark, MD  valACYclovir (VALTREX) 500 MG tablet Take 500 mg by mouth 2 (two) times daily. 07/15/20   [provider]    Allergies Prednisone  Family History  Problem Relation Age of Onset   Celiac disease Sister    Breast cancer Neg Hx     Social History Social History   Tobacco Use   Smoking status: Former    Packs/day: 0.25    Years: 57.00    Pack years: 14.25    Types: Cigarettes    Start date: 1962   Smokeless tobacco: Never   Tobacco comments:    Also VAPES on a daily basis  Vaping Use   Vaping Use:  Every day  Substance Use Topics   Alcohol use: Never   Drug use: Not Currently    Types: Marijuana    Review of Systems Constitutional: No fever/chills Eyes: No visual changes. ENT: No sore throat. Cardiovascular: Denies chest pain. Respiratory: Denies shortness of breath. Gastrointestinal: No abdominal pain.  No nausea, no vomiting.  No diarrhea.  No constipation. Genitourinary: Negative for dysuria. Musculoskeletal: Positive for severe right hip pain. Integumentary: Negative for rash. Neurological: Negative for headaches, focal weakness or numbness.   ____________________________________________   PHYSICAL EXAM:  VITAL SIGNS: ED Triage Vitals  Enc Vitals Group     BP 03/19/21 0039 (!) 131/92     Pulse Rate 03/19/21 0039 92     Resp 03/19/21 0039 18     Temp 03/19/21 0039 98.1 F (36.7 C)     Temp Source 03/19/21 0039 Oral     SpO2 03/19/21 0039 90 %     Weight 03/19/21 0038 71.7 kg (158 lb)     Height 03/19/21 0038 1.651 m ( )     Head Circumference --      Peak Flow --      Pain Score 03/19/21 0228 9     Pain Loc --      Pain Edu? --       Excl. in GC? --     Constitutional: Alert and oriented.  Eyes: Conjunctivae are normal.  Head: Atraumatic. Nose: No congestion/rhinnorhea. Mouth/Throat: Patient is wearing a mask. Neck: No stridor.  No meningeal signs.   Cardiovascular: Normal rate, regular rhythm. Good peripheral circulation. Respiratory: Normal respiratory effort.  No retractions. Gastrointestinal: Soft and nontender. No distention.  Musculoskeletal: Right leg is shortened and externally rotated.  Pain with palpation of the proximal femur and with any attempt at manipulation of the leg.  Neurovascularly intact down to the foot, able to wiggle toes.  No tenderness to palpation of her cervical spine.  No evidence of any injuries on the left leg or either of her arms. Neurologic:  Normal speech and language. No gross focal neurologic deficits are appreciated.  Skin:  Skin is warm, dry and intact. Psychiatric: Mood and affect are normal. Speech and behavior are normal.  ____________________________________________   LABS (all labs ordered are listed, but only abnormal results are displayed)  Labs Reviewed  COMPREHENSIVE METABOLIC PANEL - Abnormal; Notable for the following components:      Result Value   Glucose, Bld 103 (*)    All other components within normal limits  CBC WITH DIFFERENTIAL/PLATELET - Abnormal; Notable for the following components:   WBC 12.2 (*)    Neutro Abs 9.5 (*)    All other components within normal limits  LITHIUM LEVEL - Abnormal; Notable for the following components:   Lithium Lvl 0.39 (*)    All other components within normal limits  RESP PANEL BY RT-PCR (FLU A&B, COVID) ARPGX2  APTT  PROTIME-INR  ETHANOL  TYPE AND SCREEN  TYPE AND SCREEN   ____________________________________________  EKG  ED ECG REPORT I, Loleta Rose, the attending physician, personally viewed and interpreted this ECG.  Date: 03/19/2021 EKG Time: 1:40 AM Rate: 94 Rhythm: normal sinus rhythm QRS Axis:  normal Intervals: normal ST/T Wave abnormalities: Non-specific ST segment / T-wave changes, but no clear evidence of acute ischemia. Narrative Interpretation: no definitive evidence of acute ischemia; does not meet STEMI criteria.  ____________________________________________  RADIOLOGY I, Loleta Rose, personally viewed and evaluated these images (plain radiographs) as part of my medical  decision making, as well as reviewing the written report by the radiologist.  ED MD interpretation: Right femoral neck fracture with varus angulation  Official radiology report(s): DG Chest 1 View  Result Date: 03/19/2021 CLINICAL DATA:  Fall, right hip injury EXAM: CHEST  1 VIEW COMPARISON:  11/15/2018 FINDINGS: Heart and mediastinal contours are within normal limits. No focal opacities or effusions. No acute bony abnormality. IMPRESSION: No active disease. Electronically Signed   By: Charlett Nose M.D.   On: 03/19/2021 01:29   DG Hip Unilat W or Wo Pelvis 2-3 Views Right  Result Date: 03/19/2021 CLINICAL DATA:  Fall, right hip injury EXAM: DG HIP (WITH OR WITHOUT PELVIS) 2-3V RIGHT COMPARISON:  None. FINDINGS: There is a right femoral neck fracture with varus angulation. No subluxation or dislocation. Hip joints and SI joints symmetric and unremarkable. IMPRESSION: Right femoral neck fracture with varus angulation. Electronically Signed   By: Charlett Nose M.D.   On: 03/19/2021 01:26    ____________________________________________   PROCEDURES   Procedure(s) performed (including Critical Care):  Procedures   ____________________________________________   INITIAL IMPRESSION / MDM / ASSESSMENT AND PLAN / ED COURSE  As part of my medical decision making, I reviewed the following data within the electronic MEDICAL RECORD NUMBER Nursing notes reviewed and incorporated, Labs reviewed , EKG interpreted , Old chart reviewed, Radiograph reviewed , Discussed with admitting physician , Discussed with orthopedic  surgeon (Dr. Joice Lofts), and reviewed Notes from prior ED visits   Differential diagnosis includes, but is not limited to, hip fracture, hip dislocation, other traumatic injury, electrolyte or metabolic abnormality, intoxication, lithium toxicity.         Clinical Course as of 03/19/21 0241  Sat Mar 19, 2021  0203 Discussed case by phone with Dr. Joice Lofts who reviewed the x-rays and agrees with the plan for admission and surgery later today.  Proceeding with lab evaluation and standard work-up as described above. [CF]  0240 Discussed with Dr. Para March with the hospitalist service who will admit. [CF]  0241 Lithium level is subtherapeutic.  Ethanol negative.  Coags normal.  CBC essentially normal other than a very mild leukocytosis.  CMP is normal.  COVID test pending. [CF]    Clinical Course User Index [CF] Loleta Rose, MD     ____________________________________________  FINAL CLINICAL IMPRESSION(S) / ED DIAGNOSES  Final diagnoses:  Fall, initial encounter  Closed displaced fracture of right femoral neck (HCC)     MEDICATIONS GIVEN DURING THIS VISIT:  Medications  morphine 4 MG/ML injection 4 mg (4 mg Intravenous Given 03/19/21 0229)  ceFAZolin (ANCEF) IVPB 2g/100 mL premix (has no administration in time range)  ondansetron (ZOFRAN) injection 4 mg (4 mg Intravenous Given 03/19/21 0228)  0.9 %  sodium chloride infusion ( Intravenous New Bag/Given 03/19/21 0229)     ED Discharge Orders     None        Note:  This document was prepared using Dragon voice recognition software and may include unintentional dictation errors.   Loleta Rose, MD 03/19/21 [phone]

## 2021-03-19 NOTE — ED Notes (Signed)
Pt O2 sat 86-90 on RA. Pt denies feeling SOB. April, first RN made aware. Pt placed on 2L O2 via [place].

## 2021-03-19 NOTE — ED Notes (Signed)
Report given to OR at this time.

## 2021-03-19 NOTE — ED Notes (Signed)
EKG performed by this RN, VO from Dr. Pricilla Holm. Dr. Joice Lofts consulting cards d/t pt c/o of CP intermittently. Awaiting bed in hospital, pt stable in PACU.

## 2021-03-19 NOTE — Anesthesia Preprocedure Evaluation (Addendum)
Anesthesia Evaluation  Patient identified by MRN, date of birth, ID band Patient awake  General Assessment Comment:Patient slightly confused; AOx3 but seems forgetful , with poor understanding of medical conditions  Reviewed: Allergy & Precautions, NPO status , Patient's Chart, lab work & pertinent test results  History of Anesthesia Complications Negative for: history of anesthetic complications  Airway Mallampati: II  TM Distance: >3 FB Neck ROM: Full    Dental  (+) Upper Dentures, Edentulous Lower   Pulmonary neg shortness of breath, COPD,  COPD inhaler, former smoker,    Pulmonary exam normal breath sounds clear to auscultation       Cardiovascular Exercise Tolerance: Poor hypertension (controlled), Pt. on medications + angina (No h/o CAD;) with exertion Normal cardiovascular exam Rhythm:Regular Rate:Normal  At bedside prior to surgery 03/19/21 pt reported anterior chest pain a few times a week with minila exertion.  Not currently having chest pain but she reports that she has a cardiac eval scheduled for next week to work her up regarding this chest pain.  She denies dyspnea or pnd but cannot endorse > 4 METS.  Addendum 03/20/21: Patient seen by cardiology and deemed lowest possible cardiac risk for surgery. 2019 stress test with no ischemia 2019 echo relatively unremarkable   Neuro/Psych PSYCHIATRIC DISORDERS Anxiety Depression  Neuromuscular disease    GI/Hepatic Neg liver ROS, GERD  Medicated and Controlled,  Endo/Other  negative endocrine ROS  Renal/GU negative Renal ROS  negative genitourinary   Musculoskeletal  (+) Arthritis , Fibromyalgia -  Abdominal   Peds negative pediatric ROS (+)  Hematology negative hematology ROS (+)   Anesthesia Other Findings Past Medical History: No date: Allergy     Comment:  seasonal No date: Anxiety No date: Arthritis No date: Asthma No date: COPD (chronic obstructive  pulmonary disease) (HCC) No date: Depression No date: Dyspnea No date: GERD (gastroesophageal reflux disease) No date: Hyperlipidemia No date: Hypertension   Reproductive/Obstetrics negative OB ROS                         Anesthesia Physical Anesthesia Plan  ASA: 3  Anesthesia Plan: Spinal   Post-op Pain Management:    Induction: Intravenous  PONV Risk Score and Plan: 2 and Ondansetron, Dexamethasone, Propofol infusion and Treatment may vary due to age or medical condition  Airway Management Planned: Natural Airway and Nasal Cannula  Additional Equipment:   Intra-op Plan:   Post-operative Plan:   Informed Consent: I have reviewed the patients History and Physical, chart, labs and discussed the procedure including the risks, benefits and alternatives for the proposed anesthesia with the patient or authorized representative who has indicated his/her understanding and acceptance.     Dental advisory given  Plan Discussed with: CRNA and Surgeon  Anesthesia Plan Comments: (ADDENDUM 03/20/21: Patient seen by cardiology and deemed lowest possible cardiac risk for surgery.   Due to report of chest pain that has not been evaluated to and fact that this is not a true emergent case.  D/W surgeon and elect to get cardiac eval to determine risks prior to surgery and to help guide anesthetic management    Patient (and sister at bedside) consented for risks of spinal anesthesia including spinal headache, failed spinal requiring general endotracheal anesthesia and its associated risks including sore throat and lip/dental damage, and rare risks such as spinal infection, bleeding, and nerve damage. Patient understands.)    Anesthesia Quick Evaluation

## 2021-03-20 ENCOUNTER — Inpatient Hospital Stay: Payer: Medicare Other

## 2021-03-20 ENCOUNTER — Inpatient Hospital Stay: Payer: Medicare Other | Admitting: Anesthesiology

## 2021-03-20 ENCOUNTER — Encounter
Admission: EM | Disposition: A | Discharge status: Home Health | Payer: Self-pay | Source: Home / Self Care | Attending: Internal Medicine

## 2021-03-20 DIAGNOSIS — I10 Essential (primary) hypertension: Secondary | ICD-10-CM

## 2021-03-20 HISTORY — PX: HIP ARTHROPLASTY: SHX981

## 2021-03-20 IMAGING — DX DG ANKLE PORT 2V*R*
3 series · 3 of 3 positions shown · non-contrast
Comparison: None.

CLINICAL DATA: Fall yesterday with right ankle pain.

EXAM:
PORTABLE RIGHT ANKLE - 2 VIEW

[ankle ap]
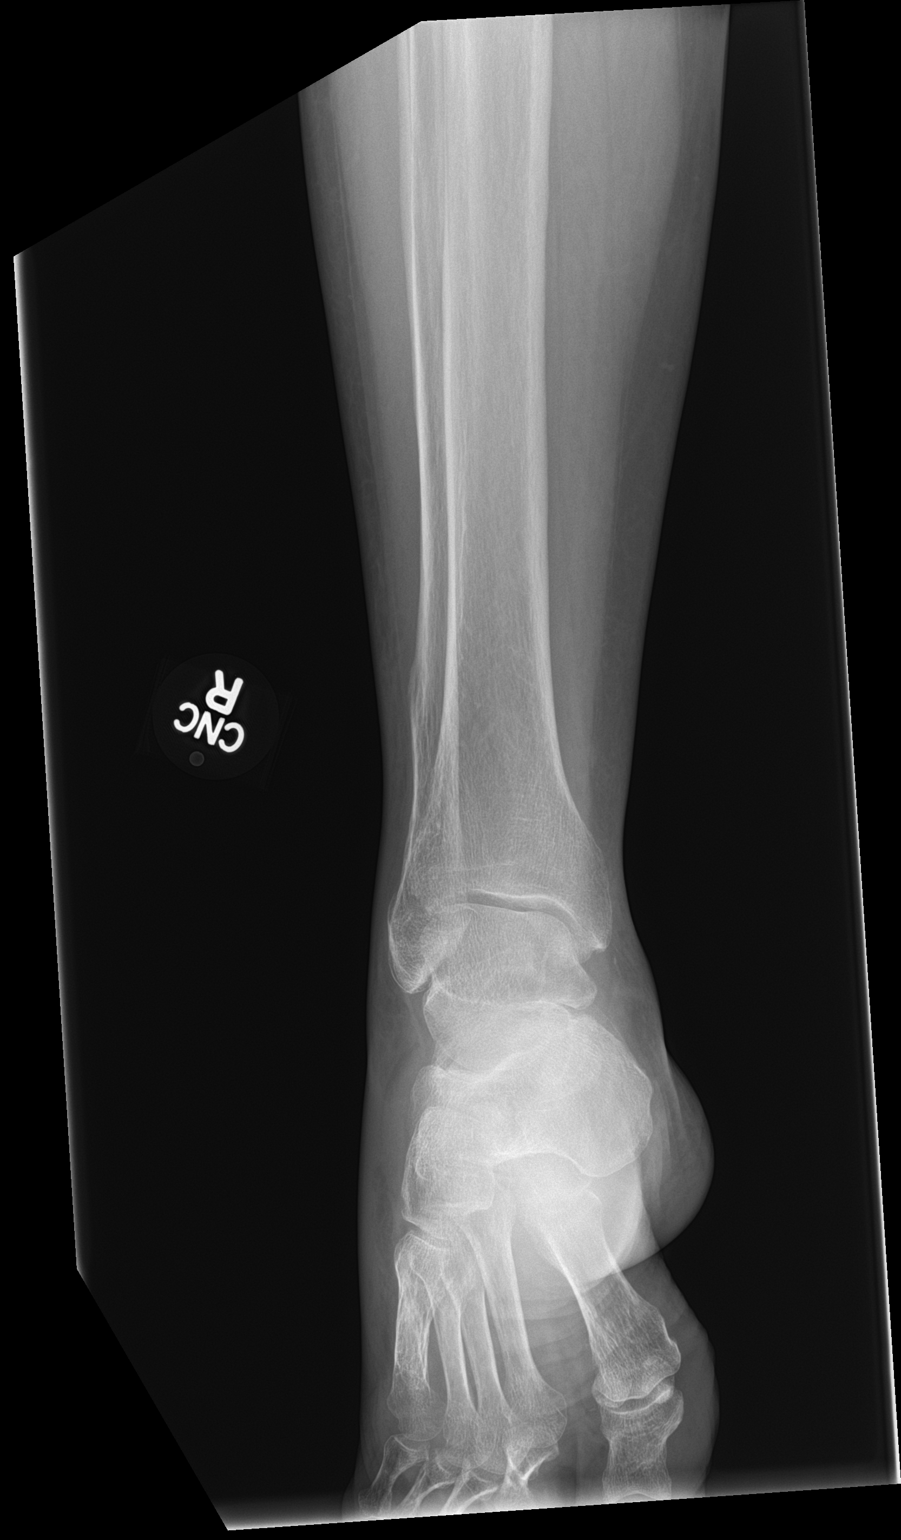

[ankle obl]
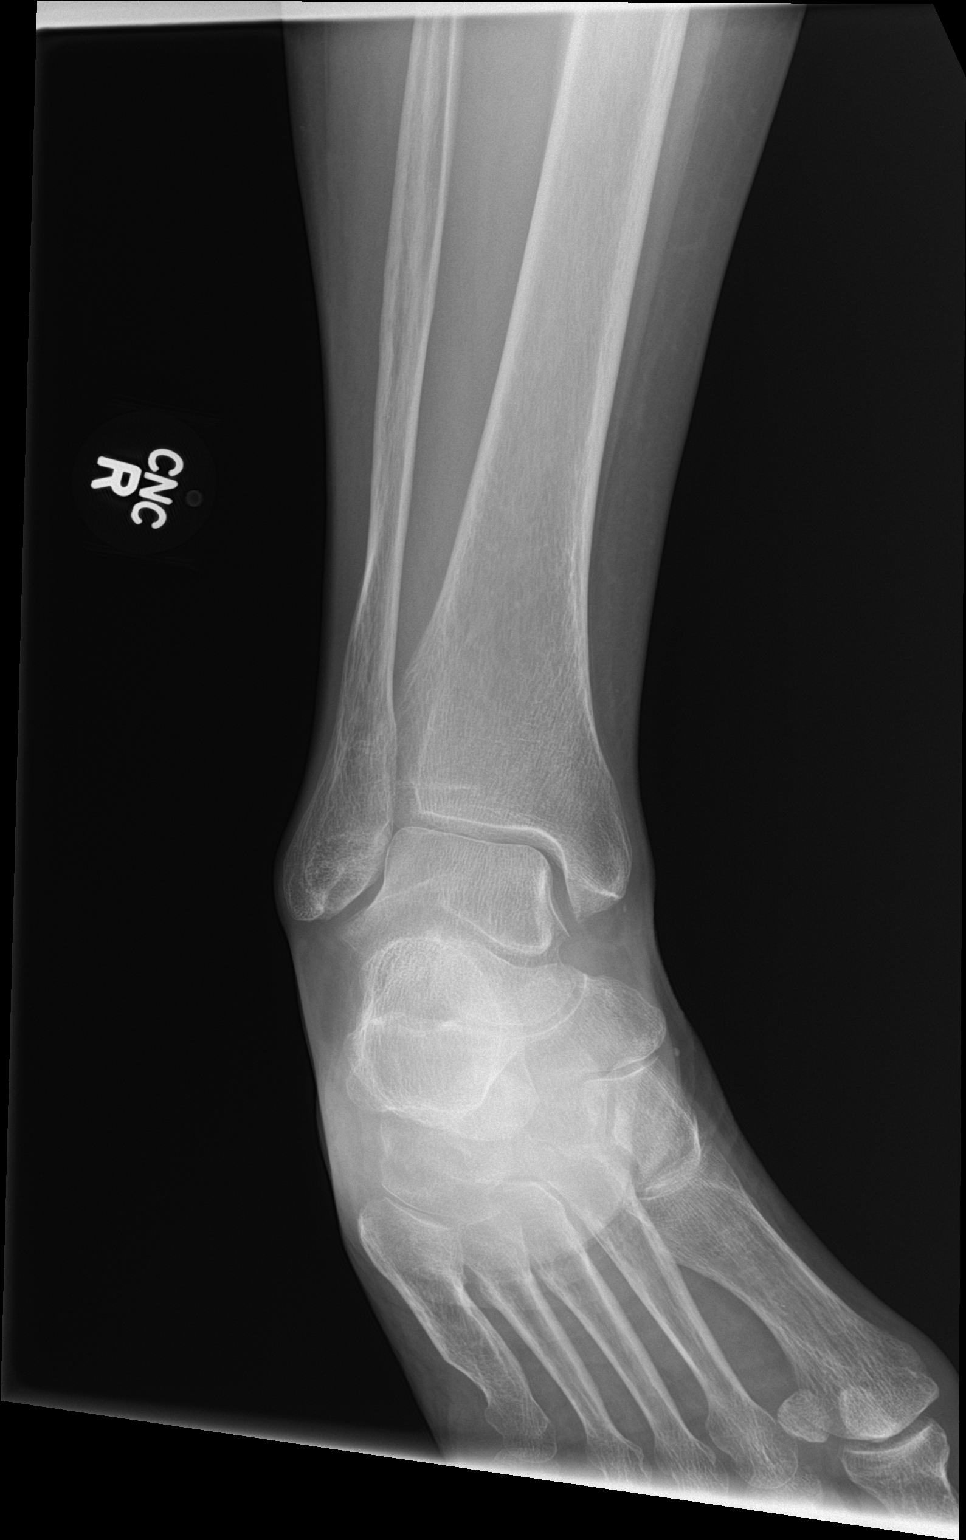

[ankle lat]
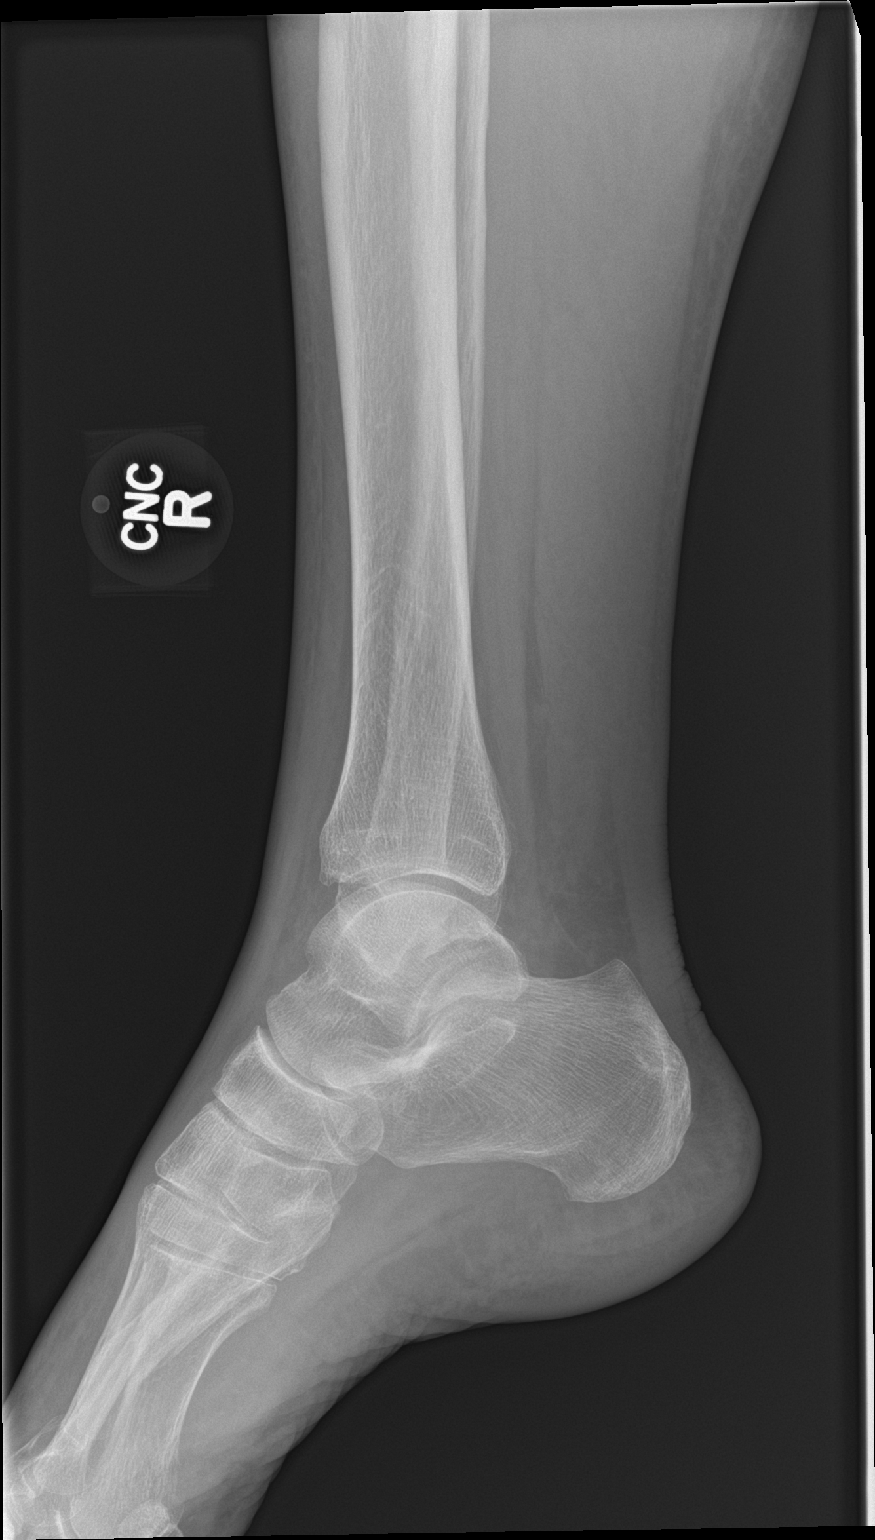

[3 of 3 positions shown; findings below may reference images not displayed]

FINDINGS: Ankle mortise is normal. No evidence of acute fracture or
dislocation. Minimal degenerative change over the tibiotalar joint.
IMPRESSION: No acute findings.

## 2021-03-20 SURGERY — HEMIARTHROPLASTY, HIP, DIRECT ANTERIOR APPROACH, FOR FRACTURE
Anesthesia: Spinal | Site: Hip | Laterality: Right

## 2021-03-20 MED ORDER — FLEET ENEMA 7-19 GM/118ML RE ENEM: 1.0000 | ENEMA | Freq: Once | RECTAL | Status: DC | PRN

## 2021-03-20 MED ORDER — BUPIVACAINE HCL (PF) 0.5 % IJ SOLN
INTRAMUSCULAR | Status: DC | PRN
  Administered 2021-03-20: 2.6 mL via INTRATHECAL

## 2021-03-20 MED ORDER — HYDROCODONE-ACETAMINOPHEN 5-325 MG PO TABS
1.0000 | ORAL_TABLET | ORAL | Status: DC | PRN
  Administered 2021-03-20 – 2021-03-22 (×2): 1 via ORAL
  Filled 2021-03-20 (×2): qty 1

## 2021-03-20 MED ORDER — SODIUM CHLORIDE (PF) 0.9 % IJ SOLN
INTRAMUSCULAR | Status: DC | PRN
  Administered 2021-03-20: 10 mL

## 2021-03-20 MED ORDER — PROPOFOL 10 MG/ML IV BOLUS
INTRAVENOUS | Status: AC
  Filled 2021-03-20: qty 40

## 2021-03-20 MED ORDER — ADULT MULTIVITAMIN W/MINERALS CH
1.0000 | ORAL_TABLET | Freq: Every day | ORAL | Status: DC
  Filled 2021-03-20: qty 1

## 2021-03-20 MED ORDER — ENOXAPARIN SODIUM 40 MG/0.4ML IJ SOSY
40.0000 mg | PREFILLED_SYRINGE | INTRAMUSCULAR | Status: DC
  Administered 2021-03-21 – 2021-03-23 (×3): 40 mg via SUBCUTANEOUS
  Filled 2021-03-20 (×3): qty 0.4

## 2021-03-20 MED ORDER — FOLIC ACID 1 MG PO TABS
1.0000 mg | ORAL_TABLET | Freq: Every day | ORAL | Status: DC
  Administered 2021-03-21 – 2021-03-23 (×3): 1 mg via ORAL
  Filled 2021-03-20 (×3): qty 1

## 2021-03-20 MED ORDER — PHENYLEPHRINE HCL (PRESSORS) 10 MG/ML IV SOLN
INTRAVENOUS | Status: DC | PRN
  Administered 2021-03-20: 200 ug via INTRAVENOUS
  Administered 2021-03-20 (×4): 100 ug via INTRAVENOUS
  Administered 2021-03-20: 200 ug via INTRAVENOUS
  Administered 2021-03-20 (×3): 100 ug via INTRAVENOUS

## 2021-03-20 MED ORDER — LISINOPRIL 20 MG PO TABS
20.0000 mg | ORAL_TABLET | Freq: Every day | ORAL | Status: DC
  Administered 2021-03-21 – 2021-03-23 (×3): 20 mg via ORAL
  Filled 2021-03-20 (×3): qty 1

## 2021-03-20 MED ORDER — POTASSIUM CHLORIDE CRYS ER 10 MEQ PO TBCR
10.0000 meq | EXTENDED_RELEASE_TABLET | Freq: Every day | ORAL | Status: DC
  Administered 2021-03-21 – 2021-03-23 (×3): 10 meq via ORAL
  Filled 2021-03-20 (×3): qty 1

## 2021-03-20 MED ORDER — FENTANYL CITRATE (PF) 100 MCG/2ML IJ SOLN
INTRAMUSCULAR | Status: AC
  Filled 2021-03-20: qty 2

## 2021-03-20 MED ORDER — SODIUM CHLORIDE 0.9 % IV SOLN: 250.0000 mL | INTRAVENOUS | Status: DC

## 2021-03-20 MED ORDER — PROPOFOL 500 MG/50ML IV EMUL
INTRAVENOUS | Status: AC
  Filled 2021-03-20: qty 50

## 2021-03-20 MED ORDER — METOCLOPRAMIDE HCL 10 MG PO TABS: 5.0000 mg | ORAL_TABLET | Freq: Three times a day (TID) | ORAL | Status: DC | PRN

## 2021-03-20 MED ORDER — UMECLIDINIUM BROMIDE 62.5 MCG/INH IN AEPB
1.0000 | INHALATION_SPRAY | Freq: Every day | RESPIRATORY_TRACT | Status: DC
  Filled 2021-03-20: qty 7

## 2021-03-20 MED ORDER — PANTOPRAZOLE SODIUM 40 MG PO TBEC
40.0000 mg | DELAYED_RELEASE_TABLET | Freq: Every day | ORAL | Status: DC
  Administered 2021-03-21 – 2021-03-23 (×3): 40 mg via ORAL
  Filled 2021-03-20 (×3): qty 1

## 2021-03-20 MED ORDER — ACETAMINOPHEN 500 MG PO TABS
500.0000 mg | ORAL_TABLET | Freq: Four times a day (QID) | ORAL | Status: AC
  Administered 2021-03-20 – 2021-03-21 (×4): 500 mg via ORAL
  Filled 2021-03-20 (×4): qty 1

## 2021-03-20 MED ORDER — LORAZEPAM 1 MG PO TABS: 1.0000 mg | ORAL_TABLET | Freq: Every day | ORAL | Status: DC | PRN

## 2021-03-20 MED ORDER — UMECLIDINIUM BROMIDE 62.5 MCG/INH IN AEPB
1.0000 | INHALATION_SPRAY | Freq: Every day | RESPIRATORY_TRACT | Status: DC
  Administered 2021-03-21 – 2021-03-23 (×3): 1 via RESPIRATORY_TRACT
  Filled 2021-03-20: qty 7

## 2021-03-20 MED ORDER — PHENYLEPHRINE HCL-NACL 10-0.9 MG/250ML-% IV SOLN
25.0000 ug/min | INTRAVENOUS | Status: DC
  Filled 2021-03-20: qty 250

## 2021-03-20 MED ORDER — ONDANSETRON HCL 4 MG/2ML IJ SOLN: 4.0000 mg | Freq: Once | INTRAMUSCULAR | Status: DC | PRN

## 2021-03-20 MED ORDER — ONDANSETRON HCL 4 MG PO TABS: 4.0000 mg | ORAL_TABLET | Freq: Four times a day (QID) | ORAL | Status: DC | PRN

## 2021-03-20 MED ORDER — FENTANYL CITRATE (PF) 100 MCG/2ML IJ SOLN: 25.0000 ug | INTRAMUSCULAR | Status: DC | PRN

## 2021-03-20 MED ORDER — DILTIAZEM HCL ER COATED BEADS 240 MG PO CP24
240.0000 mg | ORAL_CAPSULE | Freq: Every day | ORAL | Status: DC
  Administered 2021-03-21 – 2021-03-23 (×3): 240 mg via ORAL
  Filled 2021-03-20 (×3): qty 1

## 2021-03-20 MED ORDER — ACETAMINOPHEN 325 MG PO TABS: 325.0000 mg | ORAL_TABLET | Freq: Four times a day (QID) | ORAL | Status: DC | PRN

## 2021-03-20 MED ORDER — OXYBUTYNIN CHLORIDE ER 5 MG PO TB24
10.0000 mg | ORAL_TABLET | Freq: Every day | ORAL | Status: DC
  Administered 2021-03-20 – 2021-03-22 (×3): 10 mg via ORAL
  Filled 2021-03-20 (×3): qty 2

## 2021-03-20 MED ORDER — DARIFENACIN HYDROBROMIDE ER 7.5 MG PO TB24
7.5000 mg | ORAL_TABLET | Freq: Every day | ORAL | Status: DC
  Administered 2021-03-21 – 2021-03-23 (×3): 7.5 mg via ORAL
  Filled 2021-03-20 (×4): qty 1

## 2021-03-20 MED ORDER — FLUTICASONE FUROATE-VILANTEROL 100-25 MCG/INH IN AEPB
1.0000 | INHALATION_SPRAY | Freq: Every day | RESPIRATORY_TRACT | Status: DC
  Administered 2021-03-21 – 2021-03-23 (×3): 1 via RESPIRATORY_TRACT
  Filled 2021-03-20: qty 28

## 2021-03-20 MED ORDER — PROPOFOL 500 MG/50ML IV EMUL
INTRAVENOUS | Status: DC | PRN
  Administered 2021-03-20: 25 ug/kg/min via INTRAVENOUS

## 2021-03-20 MED ORDER — CEFAZOLIN SODIUM-DEXTROSE 2-4 GM/100ML-% IV SOLN
2.0000 g | Freq: Four times a day (QID) | INTRAVENOUS | Status: AC
  Administered 2021-03-20 – 2021-03-21 (×3): 2 g via INTRAVENOUS
  Filled 2021-03-20 (×3): qty 100

## 2021-03-20 MED ORDER — ARIPIPRAZOLE 2 MG PO TABS
10.0000 mg | ORAL_TABLET | Freq: Every day | ORAL | Status: DC
  Administered 2021-03-20 – 2021-03-22 (×3): 10 mg via ORAL
  Filled 2021-03-20 (×4): qty 5

## 2021-03-20 MED ORDER — BUPIVACAINE LIPOSOME 1.3 % IJ SUSP
INTRAMUSCULAR | Status: DC | PRN
  Administered 2021-03-20: 20 mL

## 2021-03-20 MED ORDER — LITHIUM CARBONATE 300 MG PO CAPS
300.0000 mg | ORAL_CAPSULE | Freq: Every day | ORAL | Status: DC
  Administered 2021-03-20 – 2021-03-22 (×3): 300 mg via ORAL
  Filled 2021-03-20 (×5): qty 1

## 2021-03-20 MED ORDER — METOCLOPRAMIDE HCL 5 MG/ML IJ SOLN: 5.0000 mg | Freq: Three times a day (TID) | INTRAMUSCULAR | Status: DC | PRN

## 2021-03-20 MED ORDER — ONDANSETRON HCL 4 MG/2ML IJ SOLN: 4.0000 mg | Freq: Four times a day (QID) | INTRAMUSCULAR | Status: DC | PRN

## 2021-03-20 MED ORDER — BUPIVACAINE-EPINEPHRINE (PF) 0.5% -1:200000 IJ SOLN
INTRAMUSCULAR | Status: DC | PRN
  Administered 2021-03-20: 30 mL

## 2021-03-20 MED ORDER — LACTATED RINGERS IV SOLN: INTRAVENOUS | Status: DC | PRN

## 2021-03-20 MED ORDER — ADULT MULTIVITAMIN W/MINERALS CH
1.0000 | ORAL_TABLET | Freq: Every day | ORAL | Status: DC
  Administered 2021-03-21 – 2021-03-23 (×3): 1 via ORAL
  Filled 2021-03-20 (×3): qty 1

## 2021-03-20 MED ORDER — PROPOFOL 10 MG/ML IV BOLUS
INTRAVENOUS | Status: DC | PRN
  Administered 2021-03-20: 30 mg via INTRAVENOUS
  Administered 2021-03-20 (×2): 20 mg via INTRAVENOUS

## 2021-03-20 MED ORDER — OXYCODONE HCL 5 MG/5ML PO SOLN: 5.0000 mg | Freq: Once | ORAL | Status: DC | PRN

## 2021-03-20 MED ORDER — ONDANSETRON HCL 4 MG/2ML IJ SOLN
INTRAMUSCULAR | Status: DC | PRN
  Administered 2021-03-20: 4 mg via INTRAVENOUS

## 2021-03-20 MED ORDER — PHENYLEPHRINE 40 MCG/ML (10ML) SYRINGE FOR IV PUSH (FOR BLOOD PRESSURE SUPPORT)
PREFILLED_SYRINGE | INTRAVENOUS | Status: AC
  Filled 2021-03-20: qty 10

## 2021-03-20 MED ORDER — TRAMADOL HCL 50 MG PO TABS
50.0000 mg | ORAL_TABLET | Freq: Four times a day (QID) | ORAL | Status: DC | PRN
  Administered 2021-03-21 – 2021-03-23 (×4): 50 mg via ORAL
  Filled 2021-03-20 (×4): qty 1

## 2021-03-20 MED ORDER — ACETAMINOPHEN 10 MG/ML IV SOLN: 1000.0000 mg | Freq: Once | INTRAVENOUS | Status: DC | PRN

## 2021-03-20 MED ORDER — MIDAZOLAM HCL 2 MG/2ML IJ SOLN
INTRAMUSCULAR | Status: AC
  Filled 2021-03-20: qty 2

## 2021-03-20 MED ORDER — KETOROLAC TROMETHAMINE 15 MG/ML IJ SOLN
7.5000 mg | Freq: Four times a day (QID) | INTRAMUSCULAR | Status: AC
  Administered 2021-03-20 – 2021-03-21 (×4): 7.5 mg via INTRAVENOUS
  Filled 2021-03-20 (×4): qty 1

## 2021-03-20 MED ORDER — SODIUM CHLORIDE 0.9 % IV SOLN: INTRAVENOUS | Status: DC

## 2021-03-20 MED ORDER — TRANEXAMIC ACID 1000 MG/10ML IV SOLN
INTRAVENOUS | Status: DC | PRN
  Administered 2021-03-20: 1000 mg via TOPICAL

## 2021-03-20 MED ORDER — FENTANYL CITRATE (PF) 100 MCG/2ML IJ SOLN
INTRAMUSCULAR | Status: DC | PRN
  Administered 2021-03-20 (×4): 25 ug via INTRAVENOUS

## 2021-03-20 MED ORDER — BUPIVACAINE HCL (PF) 0.5 % IJ SOLN
INTRAMUSCULAR | Status: AC
  Filled 2021-03-20: qty 10

## 2021-03-20 MED ORDER — PHENYLEPHRINE HCL (PRESSORS) 10 MG/ML IV SOLN: INTRAVENOUS | Status: DC | PRN

## 2021-03-20 MED ORDER — DIPHENHYDRAMINE HCL 12.5 MG/5ML PO ELIX
12.5000 mg | ORAL_SOLUTION | ORAL | Status: DC | PRN
Start: 2021-03-20 — End: 2021-03-23

## 2021-03-20 MED ORDER — MAGNESIUM HYDROXIDE 400 MG/5ML PO SUSP: 30.0000 mL | Freq: Every day | ORAL | Status: DC | PRN

## 2021-03-20 MED ORDER — SODIUM CHLORIDE 0.9 % IV SOLN
INTRAVENOUS | Status: DC | PRN
  Administered 2021-03-20: 15 mL/h via INTRAVENOUS
  Administered 2021-03-20: 18 mL/h via INTRAVENOUS

## 2021-03-20 MED ORDER — DOCUSATE SODIUM 100 MG PO CAPS
100.0000 mg | ORAL_CAPSULE | Freq: Two times a day (BID) | ORAL | Status: DC
  Administered 2021-03-20 – 2021-03-22 (×4): 100 mg via ORAL
  Filled 2021-03-20 (×4): qty 1

## 2021-03-20 MED ORDER — OXYCODONE HCL 5 MG PO TABS
5.0000 mg | ORAL_TABLET | Freq: Once | ORAL | Status: DC | PRN
Start: 2021-03-20 — End: 2021-03-20

## 2021-03-20 MED ORDER — BISACODYL 10 MG RE SUPP: 10.0000 mg | Freq: Every day | RECTAL | Status: DC | PRN

## 2021-03-20 MED ORDER — ATORVASTATIN CALCIUM 20 MG PO TABS
20.0000 mg | ORAL_TABLET | Freq: Every day | ORAL | Status: DC
  Administered 2021-03-21 – 2021-03-23 (×3): 20 mg via ORAL
  Filled 2021-03-20 (×3): qty 1

## 2021-03-20 MED ORDER — FLUTICASONE-UMECLIDIN-VILANT 100-62.5-25 MCG/INH IN AEPB: 2.0000 | INHALATION_SPRAY | Freq: Every day | RESPIRATORY_TRACT | Status: DC

## 2021-03-20 MED ORDER — ENSURE ENLIVE PO LIQD
237.0000 mL | ORAL | Status: DC
  Filled 2021-03-20: qty 237

## 2021-03-20 SURGICAL SUPPLY — 67 items
BAG DECANTER FOR FLEXI CONT (MISCELLANEOUS) IMPLANT
BIT DRILL 2.4 (BIT) ×2
BIT DRILL QC 2.4 MINI 80 (BIT) ×2 IMPLANT
BLADE SAGITTAL WIDE XTHICK NO (BLADE) ×3 IMPLANT
BLADE SURG SZ20 CARB STEEL (BLADE) ×3 IMPLANT
BNDG COHESIVE 6X5 TAN STRL LF (GAUZE/BANDAGES/DRESSINGS) ×3 IMPLANT
BOWL CEMENT MIXING ADV NOZZLE (MISCELLANEOUS) IMPLANT
CHLORAPREP W/TINT 26 (MISCELLANEOUS) ×6 IMPLANT
COVER WAND RF STERILE (DRAPES) ×3 IMPLANT
DECANTER SPIKE VIAL GLASS SM (MISCELLANEOUS) ×6 IMPLANT
DRAPE 3/4 80X56 (DRAPES) ×3 IMPLANT
DRAPE IMP U-DRAPE 54X76 (DRAPES) ×6 IMPLANT
DRAPE INCISE IOBAN 66X60 STRL (DRAPES) ×3 IMPLANT
DRAPE SURG 17X11 SM STRL (DRAPES) ×3 IMPLANT
DRAPE SURG 17X23 STRL (DRAPES) ×3 IMPLANT
DRILL BIT 2.4MM (BIT) ×4
DRSG OPSITE POSTOP 4X12 (GAUZE/BANDAGES/DRESSINGS) ×3 IMPLANT
DRSG OPSITE POSTOP 4X14 (GAUZE/BANDAGES/DRESSINGS) IMPLANT
DRSG OPSITE POSTOP 4X8 (GAUZE/BANDAGES/DRESSINGS) ×3 IMPLANT
ELECT BLADE 6.5 EXT (BLADE) ×3 IMPLANT
ELECT CAUTERY BLADE 6.4 (BLADE) ×3 IMPLANT
ELECT REM PT RETURN 9FT ADLT (ELECTROSURGICAL) ×3
ELECTRODE REM PT RTRN 9FT ADLT (ELECTROSURGICAL) ×1 IMPLANT
GAUZE PACK 2X3YD (PACKING) IMPLANT
GAUZE XEROFORM 1X8 LF (GAUZE/BANDAGES/DRESSINGS) ×3 IMPLANT
GLOVE SRG 8 PF TXTR STRL LF DI (GLOVE) IMPLANT
GLOVE SURG ENC MOIS LTX SZ8 (GLOVE) ×9 IMPLANT
GLOVE SURG ENC TEXT LTX SZ7.5 (GLOVE) IMPLANT
GLOVE SURG UNDER LTX SZ8 (GLOVE) ×3 IMPLANT
GLOVE SURG UNDER POLY LF SZ8 (GLOVE)
GOWN STRL REUS W/ TWL LRG LVL3 (GOWN DISPOSABLE) ×1 IMPLANT
GOWN STRL REUS W/ TWL XL LVL3 (GOWN DISPOSABLE) ×1 IMPLANT
GOWN STRL REUS W/TWL LRG LVL3 (GOWN DISPOSABLE) ×2
GOWN STRL REUS W/TWL XL LVL3 (GOWN DISPOSABLE) ×2
HEAD MODULAR STD 28MM (Orthopedic Implant) ×3 IMPLANT
HOOD PEEL AWAY FLYTE STAYCOOL (MISCELLANEOUS) ×6 IMPLANT
IV NS 100ML SINGLE PACK (IV SOLUTION) IMPLANT
IV NS IRRIG 3000ML ARTHROMATIC (IV SOLUTION) ×6 IMPLANT
LABEL OR SOLS (LABEL) ×3 IMPLANT
MANIFOLD NEPTUNE II (INSTRUMENTS) ×3 IMPLANT
NDL SAFETY ECLIPSE 18X1.5 (NEEDLE) ×1 IMPLANT
NEEDLE FILTER BLUNT 18X 1/2SAF (NEEDLE) ×2
NEEDLE FILTER BLUNT 18X1 1/2 (NEEDLE) ×1 IMPLANT
NEEDLE HYPO 18GX1.5 SHARP (NEEDLE) ×2
NEEDLE SPNL 20GX3.5 QUINCKE YW (NEEDLE) ×3 IMPLANT
NS IRRIG 1000ML POUR BTL (IV SOLUTION) ×3 IMPLANT
PACK HIP PROSTHESIS (MISCELLANEOUS) ×3 IMPLANT
PULSAVAC PLUS IRRIG FAN TIP (DISPOSABLE) ×3
RINGBLOC BI POLAR 28X47MM (Orthopedic Implant) ×3 IMPLANT
SHELL RINGBLOC BI POLR 28X47MM (Orthopedic Implant) ×1 IMPLANT
STAPLER SKIN PROX 35W (STAPLE) ×3 IMPLANT
STEM COLLARLESS RED 11X135MM (Stem) ×3 IMPLANT
STRAP SAFETY 5IN WIDE (MISCELLANEOUS) ×3 IMPLANT
SUT ETHIBOND 2 V 37 (SUTURE) ×3 IMPLANT
SUT VIC AB 1 CT1 36 (SUTURE) IMPLANT
SUT VIC AB 2-0 CT1 (SUTURE) ×9 IMPLANT
SUT VIC AB 2-0 CT1 27 (SUTURE)
SUT VIC AB 2-0 CT1 TAPERPNT 27 (SUTURE) IMPLANT
SUT VICRYL 1-0 27IN ABS (SUTURE) ×6
SUTURE VICRYL 1-0 27IN ABS (SUTURE) ×2 IMPLANT
SYR 10ML LL (SYRINGE) ×3 IMPLANT
SYR 30ML LL (SYRINGE) ×9 IMPLANT
SYR TB 1ML 27GX1/2 LL (SYRINGE) IMPLANT
TAPE TRANSPORE STRL 2 31045 (GAUZE/BANDAGES/DRESSINGS) ×3 IMPLANT
TIP BRUSH PULSAVAC PLUS 24.33 (MISCELLANEOUS) IMPLANT
TIP FAN IRRIG PULSAVAC PLUS (DISPOSABLE) ×1 IMPLANT
TRAP FLUID SMOKE EVACUATOR (MISCELLANEOUS) ×3 IMPLANT

## 2021-03-20 NOTE — Consult Note (Signed)
[facility] Cardiology Consultation Note  Patient ID: Teresa Lang, MRN: [id number], DOB/AGE: [id number] 66 y.o. Admit date: 03/19/2021   Date of Consult: 03/20/2021 Primary Physician: Patrice Paradise, MD Primary Cardiologist: [facility]  Chief Complaint:  Chief Complaint  Patient presents with   Fall   Reason for Consult:  Risk stratification for surgery  HPI: 65 y.o. female with known cardiovascular risk factors including hypertension hyperlipidemia and tobacco abuse of which have been very well controlled.  The patient does vape at this time and has some shortness of breath but does not do much activity.  She has had none lipid management with excellent LDL control with atorvastatin at 55.  Also blood pressure has been well controlled with current medical regimen and she has not had any significant amount of new cardiovascular symptoms.  The patient is not very active although no evidence of resting chest pain PND orthopnea syncope dizziness nausea diaphoresis.  When active carrying groceries into the house and or other household chores there has been no evidence of true active angina.  The patient has had a new episode of falling up the steps and having fracture needing surgical intervention.  With this she has not had any acute cardiac symptoms.  The patient has had an EKG showing normal sinus rhythm otherwise normal EKG.  Currently she is hemodynamically stable.  Based on these information the patient is at lowest risk possible for cardiovascular complication with hip surgery.  Past Medical History:  Diagnosis Date   Allergy    seasonal   Anxiety    Arthritis    Asthma    COPD (chronic obstructive pulmonary disease) (HCC)    Depression    Dyspnea    GERD (gastroesophageal reflux disease)    Hyperlipidemia    Hypertension       Surgical History:  Past Surgical History:  Procedure Laterality Date   ABDOMINAL HYSTERECTOMY  1983   CERVICAL DISC SURGERY      CHOLECYSTECTOMY  1983   LUMBAR LAMINECTOMY/DECOMPRESSION MICRODISCECTOMY Right 11/24/2019   Procedure: OPEN L3/4 LAMINECTOMY WITH CYST REMOVAL;  Surgeon: Lucy Chris, MD;  Location: [facility] ORS;  Service: Neurosurgery;  Laterality: Right;   SPINE SURGERY  beginning 1994   laminectomies, fusions     Home Meds: Prior to Admission medications   Medication Sig Start Date End Date Taking? Authorizing Provider  ARIPiprazole (ABILIFY) 10 MG tablet Take 10 mg by mouth at bedtime. 06/29/20  Yes [provider]  atorvastatin (LIPITOR) 20 MG tablet Take 20 mg by mouth daily. 01/31/21  Yes [provider]  busPIRone (BUSPAR) 10 MG tablet TAKE 1 TABLET BY MOUTH TWICE A DAY FOR 1 WEEK THEN TAKE 1 TABLET 3 TIMES A DAY 03/29/20  Yes [provider]  diclofenac Sodium (VOLTAREN) 1 % GEL Apply topically 4 (four) times daily.   Yes [provider]  diltiazem (CARDIZEM CD) 240 MG 24 hr capsule Take 240 mg by mouth daily. 08/21/19  Yes [provider]  folic acid (FOLVITE) 1 MG tablet Take 1 tablet (1 mg total) by mouth daily. Patient taking differently: Take 1 mg by mouth at bedtime. 03/27/19  Yes Clapacs, Jackquline Denmark, MD  lisinopril (ZESTRIL) 20 MG tablet Take 1 tablet (20 mg total) by mouth daily. 03/27/19  Yes Clapacs, Jackquline Denmark, MD  lithium carbonate (LITHOBID) 300 MG CR tablet Take 300 mg by mouth daily as needed. 02/23/21  Yes [provider]  lithium carbonate 300 MG capsule Take 1 capsule (  300 mg total) by mouth at bedtime. Patient taking differently: Take 300 mg by mouth every evening. 03/27/19  Yes Clapacs, Jackquline Denmark, MD  LORazepam (ATIVAN) 1 MG tablet Take 1 mg by mouth daily as needed. 07/16/20  Yes [provider]  methotrexate (RHEUMATREX) 2.5 MG tablet Take 17.5 mg by mouth once a week. Sunday evening 10/16/18  Yes [provider]  Multiple Vitamin (MULTIVITAMIN) tablet Take 1 tablet by mouth daily.   Yes [provider]  omeprazole  (PRILOSEC) 40 MG capsule Take 40 mg by mouth daily as needed (Reflux).   Yes [provider]  potassium chloride (KLOR-CON) 10 MEQ tablet Take 10 mEq by mouth daily. 11/07/19  Yes [provider]  albuterol (VENTOLIN HFA) 108 (90 Base) MCG/ACT inhaler Inhale 2 puffs into the lungs every 6 (six) hours as needed for wheezing or shortness of breath. Patient not taking: Reported on 03/19/2021 03/27/19   Clapacs, Jackquline Denmark, MD  ARIPiprazole (ABILIFY) 15 MG tablet Take 15 mg by mouth daily. Patient not taking: Reported on 03/19/2021 02/23/21   [provider]  DULoxetine (CYMBALTA) 30 MG capsule Take by mouth. Patient not taking: Reported on 03/19/2021    [provider]  DULoxetine (CYMBALTA) 60 MG capsule Take 60 mg by mouth daily. Patient not taking: Reported on 03/19/2021 07/25/20   [provider]  DULoxetine (CYMBALTA) 60 MG capsule Take 120 mg by mouth daily. Patient not taking: Reported on 03/19/2021 01/04/21   [provider]  hydrocortisone 2.5 % cream Apply topically. Patient not taking: Reported on 03/19/2021 05/28/20   [provider]  olopatadine (PATANOL) 0.1 % ophthalmic solution Place 1 drop into both eyes 2 (two) times daily. Patient not taking: Reported on 03/19/2021 03/27/19   Clapacs, Jackquline Denmark, MD  oxybutynin (DITROPAN-XL) 10 MG 24 hr tablet Take 10 mg by mouth at bedtime. 02/15/21   [provider]  solifenacin (VESICARE) 5 MG tablet Take 1 tablet (5 mg total) by mouth daily. 11/29/20   MacDiarmid, Lorin Picket, MD  TRELEGY ELLIPTA 100-62.5-25 MCG/INH AEPB Inhale 2 puffs into the lungs daily. 05/22/19   [provider]  umeclidinium bromide (INCRUSE ELLIPTA) 62.5 MCG/INH AEPB Inhale 1 puff into the lungs daily. 03/27/19   Clapacs, Jackquline Denmark, MD  valACYclovir (VALTREX) 500 MG tablet Take 500 mg by mouth 2 (two) times daily. Patient not taking: Reported on 03/19/2021 07/15/20   [provider]    Inpatient Medications:     sodium chloride 50 mL/hr at 03/19/21 1403    ceFAZolin (ANCEF) IV     methocarbamol (ROBAXIN) IV      Allergies:  Allergies  Allergen Reactions   Prednisone Anxiety    Social History   Socioeconomic History   Marital status: Widowed    Spouse name: Not on file   Number of children: Not on file   Years of education: Not on file   Highest education level: Not on file  Occupational History   Occupation: disability  Tobacco Use   Smoking status: Former    Packs/day: 0.25    Years: 57.00    Pack years: 14.25    Types: Cigarettes    Start date: 1962   Smokeless tobacco: Never   Tobacco comments:    Also VAPES on a daily basis  Vaping Use   Vaping Use: Every day  Substance and Sexual Activity   Alcohol use: Never   Drug use: Not Currently    Types: Marijuana   Sexual activity:  Not on file  Other Topics Concern   Not on file  Social History Narrative   Lives with sister   Social Determinants of Health   Financial Resource Strain: Not on file  Food Insecurity: Not on file  Transportation Needs: Not on file  Physical Activity: Not on file  Stress: Not on file  Social Connections: Not on file  Intimate Partner Violence: Not on file     Family History  Problem Relation Age of Onset   Celiac disease Sister    Breast cancer Neg Hx      Review of Systems Positive for limb pain Negative for: General:  chills, fever, night sweats or weight changes.  Cardiovascular: PND orthopnea syncope dizziness  Dermatological skin lesions rashes Respiratory: Cough congestion Urologic: Frequent urination urination at night and hematuria Abdominal: negative for nausea, vomiting, diarrhea, bright red blood per rectum, melena, or hematemesis Neurologic: negative for visual changes, and/or hearing changes  All other systems reviewed and are otherwise negative except as noted above.  Labs: No results for input(s): CKTOTAL, CKMB, TROPONINI in the last 72 hours. Lab Results   Component Value Date   WBC 12.2 (H) 03/19/2021   HGB 14.0 03/19/2021   HCT 42.4 03/19/2021   MCV 94.6 03/19/2021   PLT 268 03/19/2021    Recent Labs  Lab 03/19/21 0130  NA 137  K 3.6  CL 104  CO2 27  BUN 11  CREATININE 0.79  CALCIUM 9.8  PROT 6.9  BILITOT 0.9  ALKPHOS 91  ALT 22  AST 28  GLUCOSE 103*   Lab Results  Component Value Date   CHOL 145 03/21/2019   HDL 55 03/21/2019   LDLCALC 72 03/21/2019   TRIG 90 03/21/2019   No results found for: DDIMER  Radiology/Studies:  DG Chest 1 View  Result Date: 03/19/2021 CLINICAL DATA:  Fall, right hip injury EXAM: CHEST  1 VIEW COMPARISON:  11/15/2018 FINDINGS: Heart and mediastinal contours are within normal limits. No focal opacities or effusions. No acute bony abnormality. IMPRESSION: No active disease. Electronically Signed   By: Charlett Nose M.D.   On: 03/19/2021 01:29   DG Hip Unilat W or Wo Pelvis 2-3 Views Right  Result Date: 03/19/2021 CLINICAL DATA:  Fall, right hip injury EXAM: DG HIP (WITH OR WITHOUT PELVIS) 2-3V RIGHT COMPARISON:  None. FINDINGS: There is a right femoral neck fracture with varus angulation. No subluxation or dislocation. Hip joints and SI joints symmetric and unremarkable. IMPRESSION: Right femoral neck fracture with varus angulation. Electronically Signed   By: Charlett Nose M.D.   On: 03/19/2021 01:26    EKG: Normal sinus rhythm otherwise normal EKG  Weights: Filed Weights   03/19/21 0038  Weight: 71.7 kg     Physical Exam: Blood pressure 127/78, pulse 96, temperature 98.8 F (37.1 C), resp. rate 18, height 5\' 5"  (1.651 m), weight 71.7 kg, SpO2 91 %. Body mass index is 26.29 kg/m. General: Well developed, well nourished, in no acute distress. Head eyes ears nose throat: Normocephalic, atraumatic, sclera non-icteric, no xanthomas, nares are without discharge. No apparent thyromegaly and/or mass  Lungs: Normal respiratory effort.  no wheezes, no rales, no rhonchi.  Heart: RRR with  normal S1 S2. no murmur gallop, no rub, PMI is normal size and placement, carotid upstroke normal without bruit, jugular venous pressure is normal Abdomen: Soft, non-tender, non-distended with normoactive bowel sounds. No hepatomegaly. No rebound/guarding. No obvious abdominal masses. Abdominal aorta is normal size without bruit Extremities: No  edema. no cyanosis, no clubbing, no ulcers  Peripheral : 2+ bilateral upper extremity pulses, 2+ bilateral femoral pulses, 2+ bilateral dorsal pedal pulse Neuro: Alert and oriented. No facial asymmetry. No focal deficit. Moves all extremities spontaneously. Musculoskeletal: Normal muscle tone without kyphosis Psych:  Responds to questions appropriately with a normal affect.    Assessment: 65 year old female with cardiovascular risk factors very well controlled having acute fall with orthopedic injury needing surgical intervention and no current evidence of congestive heart failure or anginal symptoms with a normal EKG at lowest risk possible for cardiovascular complication with hip surgery  Plan: 1.  Proceed to hip surgery without restriction due to no current evidence of true angina congestive heart failure or other objective cardiovascular concerns 2.  No restrictions to rehabilitation and/or other medical management 3.  Continuation of heart rate and blood pressure control with diltiazem perioperatively 4.  No further cardiac diagnostics necessary at this time prior to surgical intervention  Signed, Lamar Blinks M.D. [facility] Cardiology 03/20/2021, 5:59 AM

## 2021-03-20 NOTE — Progress Notes (Addendum)
As noted1       [place]   PATIENT NAME: Teresa Lang    MR#:  [id number]  DATE OF BIRTH:  [birth year]  SUBJECTIVE:  CHIEF COMPLAINT:   Chief Complaint  Patient presents with   Fall  Sister is requesting right ankle x-ray as patient had some injury there and is swollen.  Patient is requesting pain medication at this time.  Hoping to get surgery today REVIEW OF SYSTEMS:  Review of Systems  Constitutional:  Negative for diaphoresis, fever, malaise/fatigue and weight loss.  HENT:  Negative for ear discharge, ear pain, hearing loss, nosebleeds, sore throat and tinnitus.   Eyes:  Negative for blurred vision and pain.  Respiratory:  Negative for cough, hemoptysis, shortness of breath and wheezing.   Cardiovascular:  Negative for chest pain, palpitations, orthopnea and leg swelling.  Gastrointestinal:  Negative for abdominal pain, blood in stool, constipation, diarrhea, heartburn, nausea and vomiting.  Genitourinary:  Negative for dysuria, frequency and urgency.  Musculoskeletal:  Positive for falls and joint pain. Negative for back pain and myalgias.  Skin:  Negative for itching and rash.  Neurological:  Negative for dizziness, tingling, tremors, focal weakness, seizures, weakness and headaches.  Psychiatric/Behavioral:  Negative for depression. The patient is not nervous/anxious.   DRUG ALLERGIES:   Allergies  Allergen Reactions   Prednisone Anxiety   VITALS:  Blood pressure (!) 160/77, pulse 89, temperature 98.8 F (37.1 C), resp. rate 18, height 5\' 5"  (1.651 m), weight 71.7 kg, SpO2 97 %. PHYSICAL EXAMINATION:  Physical Exam 65 year old female lying in the bed comfortably without any acute distress Lungs clear to auscultation bilaterally no wheezing rales rhonchi crepitation Cardiovascular S1-S2 normal no murmur rales or gallop Abdomen soft, benign Neuro alert and oriented, nonfocal exam Skin no rash or lesion Extremity right lower extremity somewhat  shortened and externally rotated compared to left.  Right ankle has no tenderness but minimal swelling present LABORATORY PANEL:  Female CBC Recent Labs  Lab 03/19/21 0130  WBC 12.2*  HGB 14.0  HCT 42.4  PLT 268   ------------------------------------------------------------------------------------------------------------------ Chemistries  Recent Labs  Lab 03/19/21 0130  NA 137  K 3.6  CL 104  CO2 27  GLUCOSE 103*  BUN 11  CREATININE 0.79  CALCIUM 9.8  AST 28  ALT 22  ALKPHOS 91  BILITOT 0.9   RADIOLOGY:  DG Ankle Right Port  Result Date: 03/20/2021 CLINICAL DATA:  Fall yesterday with right ankle pain. EXAM: PORTABLE RIGHT ANKLE - 2 VIEW COMPARISON:  None. FINDINGS: Ankle mortise is normal. No evidence of acute fracture or dislocation. Minimal degenerative change over the tibiotalar joint. IMPRESSION: No acute findings. Electronically Signed   By: 05/20/2021 M.D.   On: 03/20/2021 08:03   ASSESSMENT AND PLAN:  65 year old female with history of COPD, hypertension, depression, chronic pain, rheumatoid arthritis and osteopenia presenting with right hip fracture from an accidental fall.     Right femoral neck fracture secondary to an accidental fall Preoperative clearance Accidental fall - Patient tripped and fell while walking upstairs.  No evidence of other injury and no preceding illness - Patient with low risk for perioperative cardiopulmonary events and can proceed with orthopedic repair.  Patient is cleared by cardiology as well - Pain control -Dr. 70 is planning to take patient to OR today     Hypertension, essential - Continue diltiazem     Rheumatoid arthritis (HCC) - Continue methotrexate     Chronic obstructive pulmonary disease, unspecified (  HCC) - DuoNebs as needed     Depression -Continue Cymbalta, BuSpar, lorazepam and lithium   Right ankle swelling Patient does not report any pain.  Sister is requesting x-ray and she is worried and wants to  rule out any fracture X-ray ordered and is not showing any fracture  Body mass index is 26.29 kg/m.  Net IO Since Admission: 763.4 mL [03/20/21 1008]      Status is: Inpatient  Remains inpatient appropriate because:Ongoing diagnostic testing needed not appropriate for outpatient work up  Dispo: The patient is from: Home              Anticipated d/c is to: SNF              Patient currently is not medically stable to d/c.   Difficult to place patient No   DVT prophylaxis:          Lovenox    Family Communication: Sister updated at bedside on 6/12  All the records are reviewed and case discussed with Care Management/Social Worker. Management plans discussed with the patient, family and they are in agreement.  CODE STATUS: Full Code Level of care: Med-Surg  TOTAL TIME TAKING CARE OF THIS PATIENT: 35 minutes.   More than 50% of the time was spent in counseling/coordination of care: YES  POSSIBLE D/C IN 3-4 DAYS, DEPENDING ON CLINICAL CONDITION.  And surgery/postop recovery and SNF placement   Delfino Lovett M.D on 03/20/2021 at 10:08 AM  [facility]   CC: Primary care physician; Patrice Paradise, MD  Note: This dictation was prepared with Dragon dictation along with smaller phrase technology. Any transcriptional errors that result from this process are unintentional.

## 2021-03-20 NOTE — Op Note (Signed)
03/20/2021  1:39 PM  Patient:   Teresa Lang  Pre-Op Diagnosis:   Displaced femoral neck fracture, right hip.  Post-Op Diagnosis:   Same.  Procedure:   Right hip bipolar hemiarthroplasty.  Surgeon:   Maryagnes Amos, MD  Assistant:   Cranston Neighbor, PA-C  Anesthesia:   Spinal  Findings:   As above.  Complications:   None  EBL:   200 cc  Fluids:   700 cc crystalloid  UOP:   None  TT:   None  Drains:   None  Closure:   Staples  Implants:   Biomet press-fit system with a #11 standard offset reduced proximal profile Echo femoral stem, a 47 mm outer diameter shell, and a 28 mm head with a +0 mm neck adapter.  Brief Clinical Note:   The patient is a 65 year old female who sustained above-noted injury two nights ago when she apparently lost her balance and fell. The patient was brought to the emergency room where x-rays demonstrated the above-noted injury. The patient has been cleared medically and presents at this time for definitive management of the injury.  Procedure:   The patient was brought into the operating room. After adequate spinal anesthesia was obtained, the patient was repositioned in the left lateral decubitus position and secured using a lateral hip positioner. The right hip and lower extremity were prepped with ChloroPrep solution before being draped sterilely. Preoperative antibiotics were administered. A timeout was performed to verify the appropriate surgical site.    A standard posterior approach to the hip was made through an approximately 4-5 inch incision. The incision was carried down through the subcutaneous tissues to expose the gluteal fascia and proximal end of the iliotibial band. These structures were split the length of the incision and the Charnley self-retaining hip retractor placed. The bursal tissues were swept posteriorly to expose the short external rotators. The anterior border of the piriformis tendon was identified and this plane  developed down through the capsule to enter the joint. Abundant fracture hematoma was suctioned. A flap of tissue was elevated off the posterior aspect of the femoral neck and greater trochanter and retracted posteriorly. This flap included the piriformis tendon, the short external rotators, and the posterior capsule. The femoral head was removed in its entirety, then taken to the back table where it was measured and found to be optimally replicated by a 47 mm head. The appropriate trial head was inserted and found to demonstrate an excellent suction fit.   Attention was directed to the femoral side. The femoral neck was recut 10-12 mm above the lesser trochanter using an oscillating saw. The piriformis fossa was debrided of soft tissues before the intramedullary canal was accessed through this point using a triple step reamer. The canal was reamed sequentially beginning with a #7 tapered reamer and progressing to a #12 tapered reamer. This provided excellent circumferential chatter. A box osteotome was used to establish version before the canal was broached sequentially beginning with a #9 broach and progressing to a #11 broach. This was noted to be quite tight proximally and stable to rotation, so it was left in place and several trial reductions performed. The permanent #11 standard offset reduced proximal profile femoral stem was impacted into place. A repeat trial reduction was performed using both the -3 mm and +0 mm neck lengths. The +0 mm neck length demonstrated excellent stability both in extension and external rotation as well as with flexion to 90 and internal rotation beyond  70. It also was stable in the position of sleep. The 47 mm outer diameter bipolar shell and a 28 mm head with a +0 mm neck construct was put together on the back table before being impacted onto the stem of the femoral component. The Morse taper locking mechanism was verified using manual distraction before the head was  relocated and the hip placed through a range of motion with the findings as described above.  The wound was copiously irrigated with sterile saline solution via the jet lavage system before the peri-incisional and pericapsular tissues were injected with 30 cc of 0.5% Sensorcaine with epinephrine and 20 cc of Exparel diluted out to 60 cc with normal saline to help with postoperative analgesia. The posterior flap was reapproximated to the posterior aspect of the greater trochanter using #2 Tycron interrupted sutures placed through drill holes. The iliotibial band was reapproximated using #1 Vicryl interrupted sutures before the gluteal fascia was closed using a running #0 Vicryl suture. At this point, 1 g of transexemic acid in 10 cc of normal saline was injected into the joint to help reduce postoperative bleeding. The subcutaneous tissues were closed in several layers using 2-0 Vicryl interrupted sutures before the skin was closed using staples. A sterile occlusive dressing was applied to the wound . The patient then was rolled back into the supine position on the hospital bed before being awakened and returned to the recovery room in satisfactory condition after tolerating the procedure well.

## 2021-03-20 NOTE — Progress Notes (Signed)
Initial Nutrition Assessment  DOCUMENTATION CODES:  Not applicable  INTERVENTION:  Recommend continue regular diet after surgery, encourage PO intake Ensure Enlive po 1x/d, each supplement provides 350 kcal and 20 grams of protein MVI with minerals daily  NUTRITION DIAGNOSIS:  Increased nutrient needs related to post-op healing, hip fracture as evidenced by estimated needs.  GOAL:  Patient will meet greater than or equal to 90% of their needs  MONITOR:  PO intake, Supplement acceptance, Diet advancement, Skin  REASON FOR ASSESSMENT:  Consult Hip fracture protocol  ASSESSMENT:  65 y.o. pt with multiple medical problems including HTN, HLD, GERD, COPD, asthma, anxiety/depression, chronic pain syndrome, and osteopenia  presented to ED after a fall at home. Se fell onto her right side, injuring her right hip.  She was brought to the emergency room where x-rays demonstrated a varus displaced fracture of the right femoral neck.  Pt out of room for surgery at the time of assessment. Per chart review pt lives at home with her sister at baseline and is functionally independent. Minor weight loss of 7% noted in the last 5 months (11/1-6/11). Will monitor intake trends and recommend nutrition supplement after surgery to assist with post-op recovery.  Nutritionally Relevant Medications: Continuous Infusions:  sodium chloride 50 mL/hr at 03/20/21 0813   PRN Meds: senna-docusate,   Labs Reviewed  NUTRITION - FOCUSED PHYSICAL EXAM: Defer to in-person assessment  Diet Order:   Diet Order             Diet regular Room service appropriate? Yes; Fluid consistency: Thin  Diet effective now                   EDUCATION NEEDS:  No education needs have been identified at this time  Skin:  Skin Assessment: Reviewed RN Assessment  Last BM:  6/10  Height:  Ht Readings from Last 1 Encounters:  03/19/21 5\' 5"  (1.651 m)    Weight:  Wt Readings from Last 1 Encounters:  03/19/21 71.7  kg    Ideal Body Weight:  56.8 kg  BMI:  Body mass index is 26.29 kg/m.  Estimated Nutritional Needs:   Kcal:  1600-1800 kcal/d  Protein:  80-95 g/d  Fluid:  >1.8L/d   05/19/21, RD, LDN Clinical Dietitian Pager on Amion

## 2021-03-20 NOTE — Transfer of Care (Signed)
Immediate Anesthesia Transfer of Care Note  Patient: Teresa Lang  Procedure(s) Performed: ARTHROPLASTY BIPOLAR HIP (HEMIARTHROPLASTY) (Right: Hip)  Patient Location: PACU  Anesthesia Type:Spinal  Level of Consciousness: awake  Airway & Oxygen Therapy: Patient Spontanous Breathing  Post-op Assessment: Report given to RN  Post vital signs: stable  Last Vitals:  Vitals Value Taken Time  BP 127/51 03/20/21 1337  Temp 37 C 03/20/21 1337  Pulse 89 03/20/21 1338  Resp 17 03/20/21 1338  SpO2 94 % 03/20/21 1338  Vitals shown include unvalidated device data.  Last Pain:  Vitals:   03/20/21 1337  TempSrc:   PainSc: 0-No pain         Complications: No notable events documented.

## 2021-03-20 NOTE — Anesthesia Procedure Notes (Signed)
Spinal  Patient location during procedure: OR Start time: 03/20/2021 11:40 AM End time: 03/20/2021 11:45 AM Reason for block: surgical anesthesia Staffing Performed: anesthesiologist  Anesthesiologist: Arita Miss, MD Preanesthetic Checklist Completed: patient identified, IV checked, site marked, risks and benefits discussed, surgical consent, monitors and equipment checked, pre-op evaluation and timeout performed Spinal Block Patient position: left lateral decubitus Prep: ChloraPrep and site prepped and draped Patient monitoring: heart rate, continuous pulse ox, blood pressure and cardiac monitor Approach: midline Location: L4-5 Injection technique: single-shot Needle Needle type: Quincke  Needle gauge: 22 G Needle length: 9 cm Assessment Sensory level: T10 Events: CSF return Additional Notes Negative paresthesia. Negative blood return. Positive free-flowing CSF. Expiration date of kit checked and confirmed. Patient tolerated procedure well, without complications.

## 2021-03-21 ENCOUNTER — Inpatient Hospital Stay: Payer: Medicare Other

## 2021-03-21 ENCOUNTER — Inpatient Hospital Stay
Admit: 2021-03-21 | Discharge: 2021-03-21 | Disposition: A | Discharge status: Home / Self Care | Payer: Medicare Other | Attending: Surgery | Admitting: Surgery

## 2021-03-21 ENCOUNTER — Encounter: Payer: Self-pay | Admitting: Surgery

## 2021-03-21 DIAGNOSIS — S72031A Displaced midcervical fracture of right femur, initial encounter for closed fracture: Secondary | ICD-10-CM | POA: Insufficient documentation

## 2021-03-21 DIAGNOSIS — W19XXXA Unspecified fall, initial encounter: Secondary | ICD-10-CM

## 2021-03-21 DIAGNOSIS — Z96649 Presence of unspecified artificial hip joint: Secondary | ICD-10-CM | POA: Insufficient documentation

## 2021-03-21 DIAGNOSIS — F32A Depression, unspecified: Secondary | ICD-10-CM

## 2021-03-21 LAB — CBC
HCT: 33.8 % — ABNORMAL LOW (ref 36.0–46.0)
Hemoglobin: 11.4 g/dL — ABNORMAL LOW (ref 12.0–15.0)
MCH: 32.2 pg (ref 26.0–34.0)
MCHC: 33.7 g/dL (ref 30.0–36.0)
MCV: 95.5 fL (ref 80.0–100.0)
Platelets: 131 10*3/uL — ABNORMAL LOW (ref 150–400)
RBC: 3.54 MIL/uL — ABNORMAL LOW (ref 3.87–5.11)
RDW: 12.9 % (ref 11.5–15.5)
WBC: 7.9 10*3/uL (ref 4.0–10.5)
nRBC: 0 % (ref 0.0–0.2)

## 2021-03-21 LAB — ECHOCARDIOGRAM COMPLETE
AR max vel: 3.6 cm2
AV Area VTI: 3.08 cm2
AV Area mean vel: 3.02 cm2
AV Mean grad: 4 mmHg
AV Peak grad: 7.3 mmHg
Ao pk vel: 1.35 m/s
Area-P 1/2: 4.86 cm2
Height: 65 in
MV VTI: 3.59 cm2
S' Lateral: 3.1 cm
Weight: 2528 oz

## 2021-03-21 LAB — BASIC METABOLIC PANEL
Anion gap: 4 — ABNORMAL LOW (ref 5–15)
BUN: 11 mg/dL (ref 8–23)
CO2: 28 mmol/L (ref 22–32)
Calcium: 9 mg/dL (ref 8.9–10.3)
Chloride: 105 mmol/L (ref 98–111)
Creatinine, Ser: 0.63 mg/dL (ref 0.44–1.00)
GFR, Estimated: >60 mL/min (ref >60)
Glucose, Bld: 108 mg/dL — ABNORMAL HIGH (ref 70–99)
Potassium: 3.8 mmol/L (ref 3.5–5.1)
Sodium: 137 mmol/L (ref 135–145)

## 2021-03-21 IMAGING — CR DG HIP (WITH OR WITHOUT PELVIS) 2-3V*R*
2 series · 2 of 2 positions shown · non-contrast
Comparison: None.

CLINICAL DATA: Status post right hip replacement

EXAM:
DG HIP (WITH OR WITHOUT PELVIS) 2-3V RIGHT

[pelvis ap]
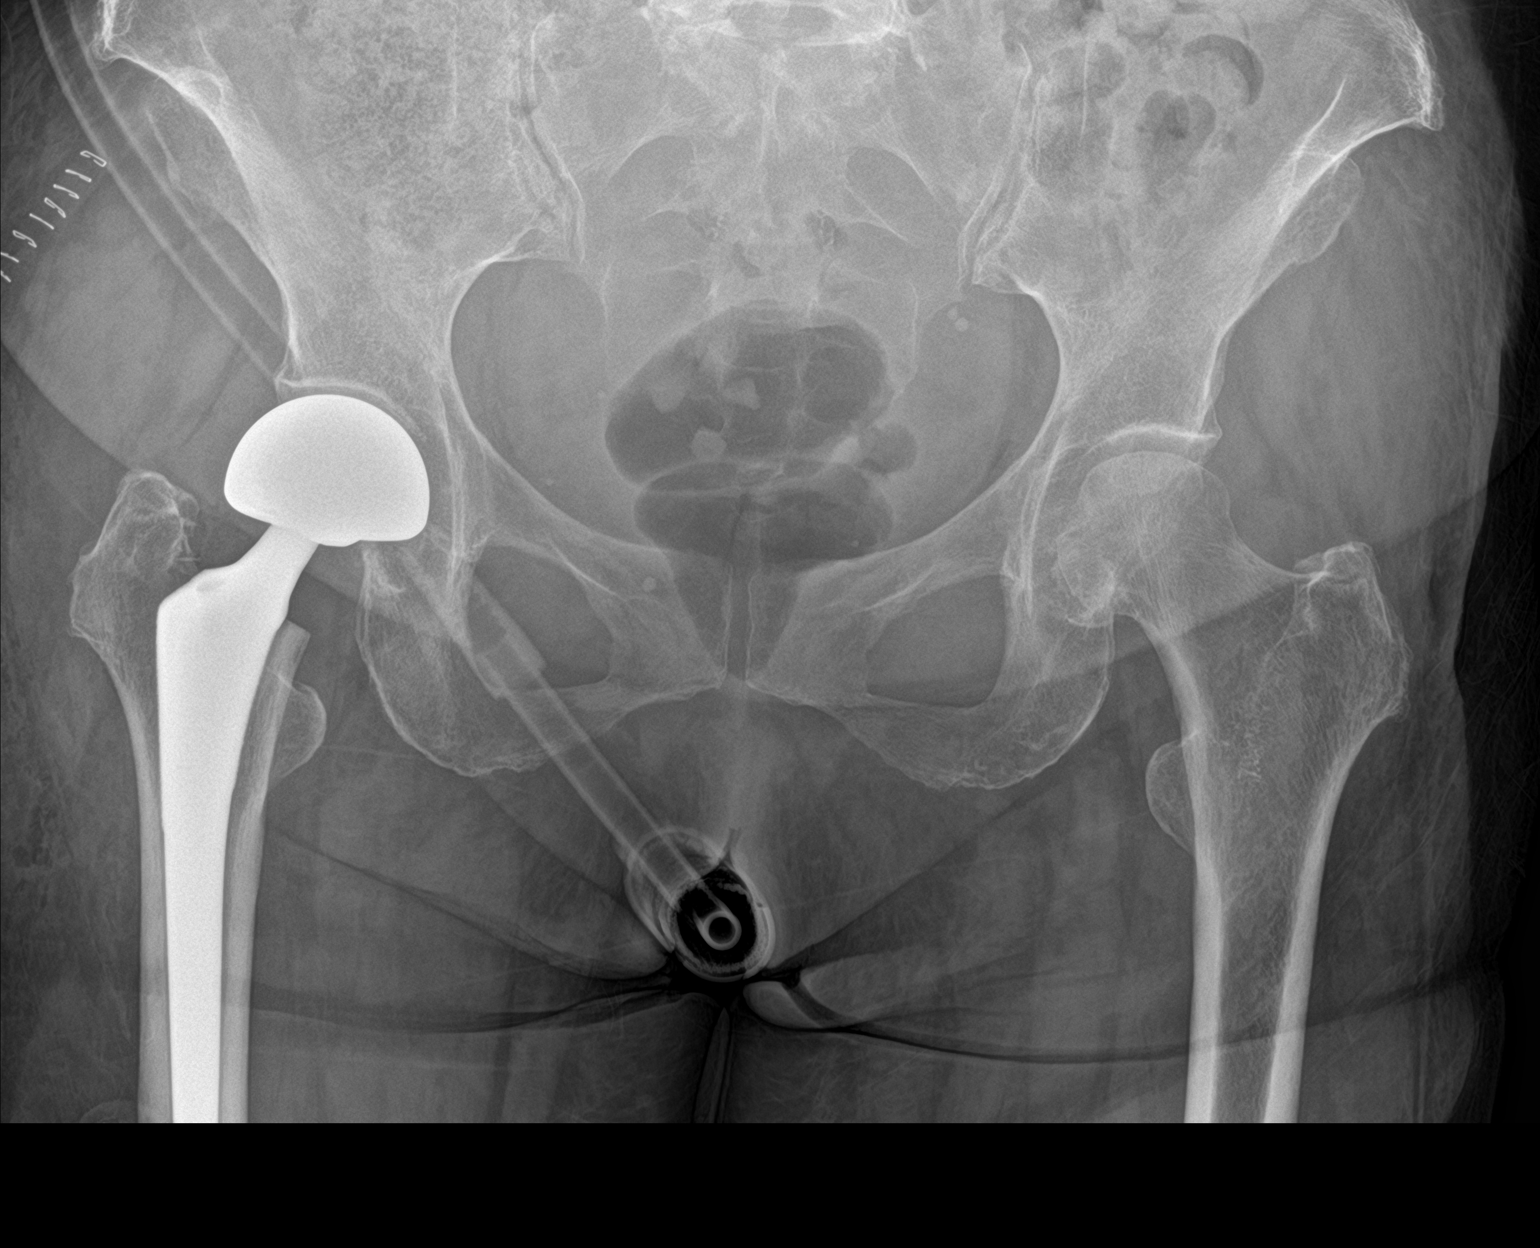

[hip lat]
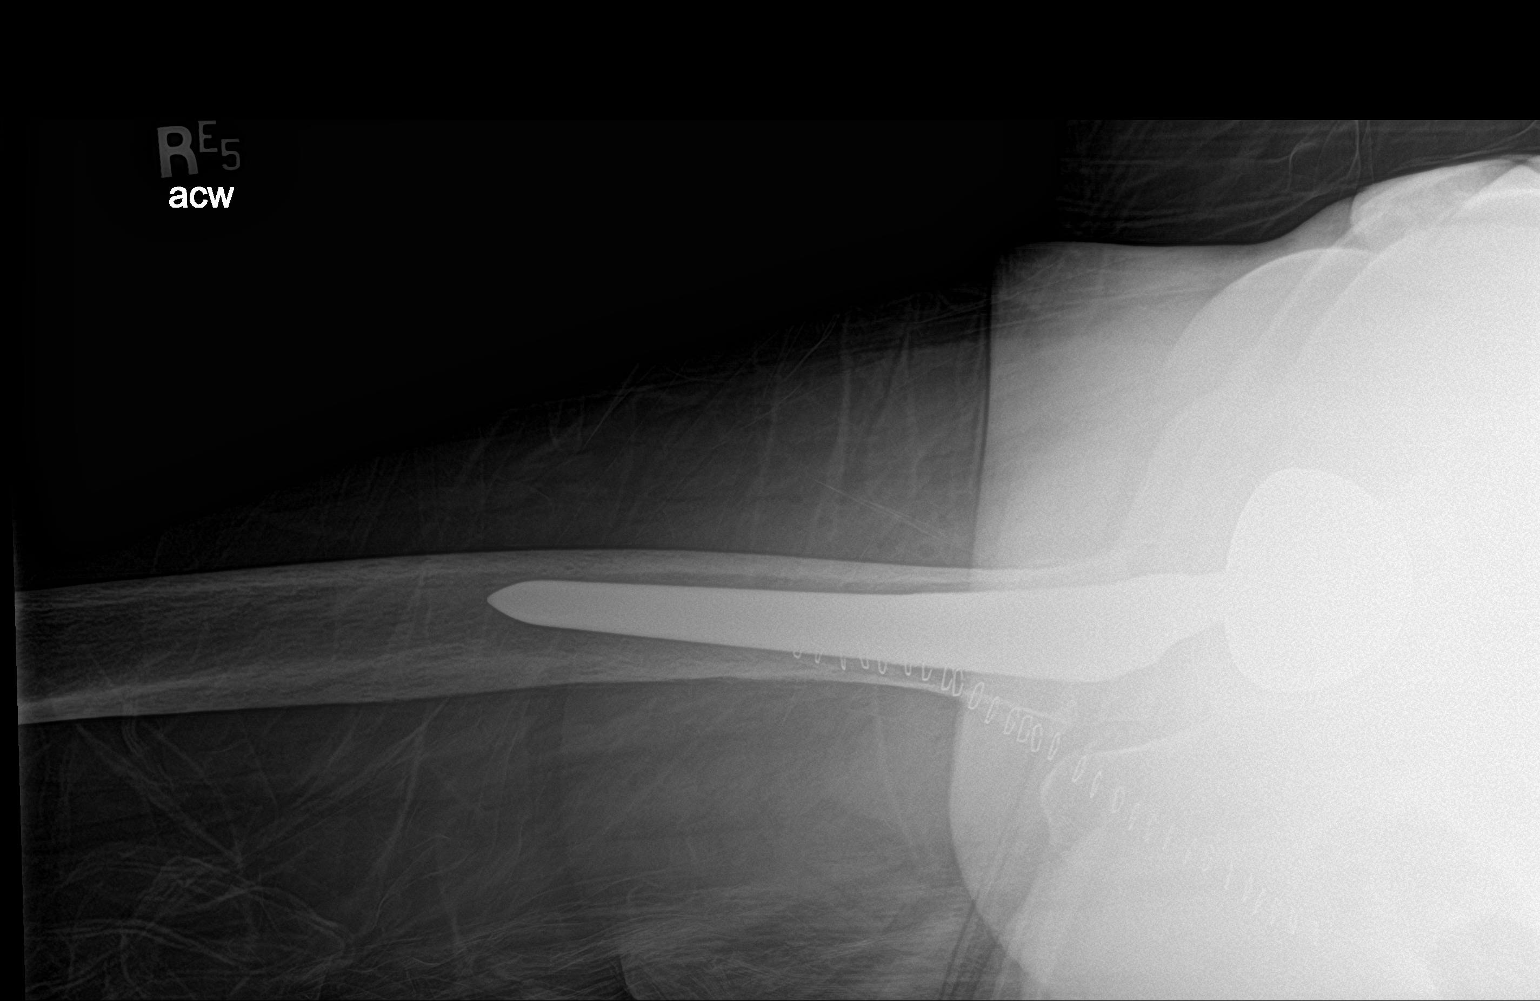

[2 of 2 positions shown; findings below may reference images not displayed]

FINDINGS: Right hip replacement is now seen in satisfactory position. Mild
degenerative changes of the left hip joint are noted. No soft tissue
abnormality is seen.
IMPRESSION: Status post right hip replacement.

## 2021-03-21 MED ORDER — DULOXETINE HCL 60 MG PO CPEP
60.0000 mg | ORAL_CAPSULE | Freq: Two times a day (BID) | ORAL | Status: DC
  Administered 2021-03-21 – 2021-03-23 (×4): 60 mg via ORAL
  Filled 2021-03-21 (×5): qty 1

## 2021-03-21 MED ORDER — BUSPIRONE HCL 10 MG PO TABS
10.0000 mg | ORAL_TABLET | Freq: Three times a day (TID) | ORAL | Status: DC
  Administered 2021-03-21 – 2021-03-23 (×5): 10 mg via ORAL
  Filled 2021-03-21 (×5): qty 1

## 2021-03-21 NOTE — Progress Notes (Signed)
Physical Therapy Treatment Patient Details Name: Stephana Morell MRN: [id number] DOB: [birth year] Today's Date: 03/21/2021    History of Present Illness Anjali Manzella is a 65 y.o. female with medical history significant for COPD, hypertension, depression, chronic pain, rheumatoid arthritis and osteopenia brought in by EMS following a fall onto her right hip with difficulty getting up thereafter.  Patient states she was walking up some stairs tripped and fell onto her right hip with sudden onset sharp pain.    PT Comments    Patient received in bed, she is agreeable to PT session. Requires min assist for supine to sit. Cues for hip precautions. Min guard with cues for hand placement with sit to stand. Min guard for ambulation. Cues for proper sequencing and use of AD. Patient will continue to benefit from skilled PT while here to improve strength, safety and functional independence.      Follow Up Recommendations  Home health PT     Equipment Recommendations  None recommended by PT    Recommendations for Other Services       Precautions / Restrictions Precautions Precautions: Posterior Hip;Fall Precaution Booklet Issued: No Restrictions Weight Bearing Restrictions: Yes RLE Weight Bearing: Weight bearing as tolerated    Mobility  Bed Mobility Overal bed mobility: Needs Assistance Bed Mobility: Supine to Sit;Sit to Supine     Supine to sit: Min assist Sit to supine: Min guard   General bed mobility comments: min assist to raise trunk to seated position.    Transfers Overall transfer level: Needs assistance Equipment used: Rolling walker (2 wheeled) Transfers: Sit to/from Stand Sit to Stand: Min guard         General transfer comment: cues for hand placement/safety  Ambulation/Gait Ambulation/Gait assistance: Min guard Gait Distance (Feet): 25 Feet Assistive device: Rolling walker (2 wheeled) Gait Pattern/deviations: Step-to pattern;Decreased  step length - right;Decreased step length - left;Decreased weight shift to right Gait velocity: decr   General Gait Details: patient able to ambulate with RW and min guard. Cues for sequencing and use of AD.   Stairs             Wheelchair Mobility    Modified Rankin (Stroke Patients Only)       Balance Overall balance assessment: Needs assistance Sitting-balance support: Feet supported Sitting balance-Leahy Scale: Good     Standing balance support: Bilateral upper extremity supported;During functional activity Standing balance-Leahy Scale: Fair Standing balance comment: reliant on B UE support                            Cognition Arousal/Alertness: Lethargic;Suspect due to medications Behavior During Therapy: [facility] for tasks assessed/performed Overall Cognitive Status: Within Functional Limits for tasks assessed                                        Exercises Total Joint Exercises Ankle Circles/Pumps: AROM;Both;10 reps Quad Sets: AROM;Both;10 reps Gluteal Sets: AROM;Both;10 reps Heel Slides: AROM;Right;10 reps Hip ABduction/ADduction: AROM;Right;10 reps    General Comments        Pertinent Vitals/Pain Pain Assessment: 0-10 Pain Score: 8  Pain Location: R hip Pain Descriptors / Indicators: Aching;Discomfort;Sore Pain Intervention(s): Monitored during session;Repositioned    Home Living Family/patient expects to be discharged to:: Private residence Living Arrangements: Other relatives Available Help at Discharge: Family;Available PRN/intermittently Type of Home: House Home Access:  Ramped entrance   Home Layout: One level        Prior Function Level of Independence: Independent      Comments: was not using AD prior to admission   PT Goals (current goals can now be found in the care plan section) Acute Rehab PT Goals Patient Stated Goal: to improve PT Goal Formulation: With patient Time For Goal Achievement:  03/28/21 Potential to Achieve Goals: Good Progress towards PT goals: Progressing toward goals    Frequency    BID      PT Plan Current plan remains appropriate    Co-evaluation              AM-PAC PT "6 Clicks" Mobility   Outcome Measure  Help needed turning from your back to your side while in a flat bed without using bedrails?: A Little Help needed moving from lying on your back to sitting on the side of a flat bed without using bedrails?: A Little Help needed moving to and from a bed to a chair (including a wheelchair)?: A Little Help needed standing up from a chair using your arms (e.g., wheelchair or bedside chair)?: A Little Help needed to walk in hospital room?: A Little Help needed climbing 3-5 steps with a railing? : A Lot 6 Click Score: 17    End of Session Equipment Utilized During Treatment: Gait belt;Oxygen Activity Tolerance: Patient limited by pain;Patient limited by fatigue Patient left: in bed;with bed alarm set;with call bell/phone within reach Nurse Communication: Mobility status PT Visit Diagnosis: Other abnormalities of gait and mobility (R26.89);Muscle weakness (generalized) (M62.81);Pain;Difficulty in walking, not elsewhere classified (R26.2);History of falling (Z91.81) Pain - Right/Left: Right Pain - part of body: Hip     Time: 1350-1413 PT Time Calculation (min) (ACUTE ONLY): 23 min  Charges:  $Gait Training: 8-22 mins $Therapeutic Exercise: 8-22 mins                    [employer], PT, GCS 03/21/21,2:40 PM

## 2021-03-21 NOTE — Progress Notes (Signed)
   03/21/21 1337  Clinical Encounter Type  Visited With Patient  Visit Type Initial  Referral From Chaplain  Consult/Referral To Chaplain  Spiritual Encounters  Spiritual Needs Emotional  Stress Factors  Patient Stress Factors Health changes  Chaplain Palmer Fahrner visited with Teresa Lang in room 1A-147A. Patient states she was walking up some stairs tripped and fell onto her right hip with sudden onset sharp pain. She said she had surgery and she is doing well but she's still in a lot of pain. The nurse came in during my visit and she was very kind and pleasant. I provided a ministry of presence, emotional and spiritual support.

## 2021-03-21 NOTE — Progress Notes (Signed)
[facility] Cardiology [facility] Encounter Note  Patient: Teresa Lang / Admit Date: 03/19/2021 / Date of Encounter: 03/21/2021, 8:46 AM   Subjective: Overall patient tolerated orthopedic surgery without evidence of significant complications.  No evidence of congestive heart failure or angina.  The patient does have COPD tobacco abuse and hypertension currently not causing any significant concerns today.  Patient remains on antihypertensive medication management and stable today.  Patient will go through physical rehabilitation without restriction  Review of Systems: Positive for: Limb pain Negative for: Vision change, hearing change, syncope, dizziness, nausea, vomiting,diarrhea, bloody stool, stomach pain, cough, congestion, diaphoresis, urinary frequency, urinary pain,skin lesions, skin rashes Others previously listed  Objective: Telemetry: Normal sinus rhythm Physical Exam: Blood pressure 135/81, pulse 94, temperature 97.8 F (36.6 C), temperature source Oral, resp. rate 20, height 5\' 5"  (1.651 m), weight 71.7 kg, SpO2 97 %. Body mass index is 26.29 kg/m. General: Well developed, well nourished, in no acute distress. Head: Normocephalic, atraumatic, sclera non-icteric, no xanthomas, nares are without discharge. Neck: No apparent masses Lungs: Normal respirations with no wheezes, no rhonchi, no rales , no crackles   Heart: Regular rate and rhythm, normal S1 S2, no murmur, no rub, no gallop, PMI is normal size and placement, carotid upstroke normal without bruit, jugular venous pressure normal Abdomen: Soft, non-tender, non-distended with normoactive bowel sounds. No hepatosplenomegaly. Abdominal aorta is normal size without bruit Extremities: No edema, no clubbing, no cyanosis, no ulcers,  Peripheral: 2+ radial, 2+ femoral, 2+ dorsal pedal pulses Neuro: Alert and oriented. Moves all extremities spontaneously. Psych:  Responds to questions appropriately with a normal  affect.   Intake/Output Summary (Last 24 hours) at 03/21/2021 0846 Last data filed at 03/21/2021 0519 Gross per 24 hour  Intake 3240 ml  Output 800 ml  Net 2440 ml    Inpatient Medications:   acetaminophen  500 mg Oral Q6H   ARIPiprazole  10 mg Oral QHS   atorvastatin  20 mg Oral Daily   darifenacin  7.5 mg Oral Daily   diltiazem  240 mg Oral Daily   docusate sodium  100 mg Oral BID   enoxaparin (LOVENOX) injection  40 mg Subcutaneous Q24H   feeding supplement  237 mL Oral Q24H   fluticasone furoate-vilanterol  1 puff Inhalation Daily   And   umeclidinium bromide  1 puff Inhalation Daily   folic acid  1 mg Oral Daily   ketorolac  7.5 mg Intravenous Q6H   lisinopril  20 mg Oral Daily   lithium carbonate  300 mg Oral QHS   multivitamin with minerals  1 tablet Oral Daily   oxybutynin  10 mg Oral QHS   pantoprazole  40 mg Oral Daily   potassium chloride  10 mEq Oral Daily   Infusions:   sodium chloride 75 mL/hr at 03/20/21 1534   sodium chloride     methocarbamol (ROBAXIN) IV     phenylephrine (NEO-SYNEPHRINE) Adult infusion      Labs: Recent Labs    03/19/21 0130 03/21/21 0534  NA 137 137  K 3.6 3.8  CL 104 105  CO2 27 28  GLUCOSE 103* 108*  BUN 11 11  CREATININE 0.79 0.63  CALCIUM 9.8 9.0   Recent Labs    03/19/21 0130  AST 28  ALT 22  ALKPHOS 91  BILITOT 0.9  PROT 6.9  ALBUMIN 3.8   Recent Labs    03/19/21 0130 03/21/21 0534  WBC 12.2* 7.9  NEUTROABS 9.5*  --  HGB 14.0 11.4*  HCT 42.4 33.8*  MCV 94.6 95.5  PLT 268 131*   No results for input(s): CKTOTAL, CKMB, TROPONINI in the last 72 hours. Invalid input(s): POCBNP No results for input(s): HGBA1C in the last 72 hours.   Weights: Filed Weights   03/19/21 0038  Weight: 71.7 kg     Radiology/Studies:  DG Chest 1 View  Result Date: 03/19/2021 CLINICAL DATA:  Fall, right hip injury EXAM: CHEST  1 VIEW COMPARISON:  11/15/2018 FINDINGS: Heart and mediastinal contours are within normal  limits. No focal opacities or effusions. No acute bony abnormality. IMPRESSION: No active disease. Electronically Signed   By: Charlett Nose M.D.   On: 03/19/2021 01:29   DG Ankle Right Port  Result Date: 03/20/2021 CLINICAL DATA:  Fall yesterday with right ankle pain. EXAM: PORTABLE RIGHT ANKLE - 2 VIEW COMPARISON:  None. FINDINGS: Ankle mortise is normal. No evidence of acute fracture or dislocation. Minimal degenerative change over the tibiotalar joint. IMPRESSION: No acute findings. Electronically Signed   By: Elberta Fortis M.D.   On: 03/20/2021 08:03   DG HIP UNILAT W OR W/O PELVIS 2-3 VIEWS RIGHT  Result Date: 03/21/2021 CLINICAL DATA:  Status post right hip replacement EXAM: DG HIP (WITH OR WITHOUT PELVIS) 2-3V RIGHT COMPARISON:  None. FINDINGS: Right hip replacement is now seen in satisfactory position. Mild degenerative changes of the left hip joint are noted. No soft tissue abnormality is seen. IMPRESSION: Status post right hip replacement. Electronically Signed   By: Alcide Clever M.D.   On: 03/21/2021 08:29   DG Hip Unilat W or Wo Pelvis 2-3 Views Right  Result Date: 03/19/2021 CLINICAL DATA:  Fall, right hip injury EXAM: DG HIP (WITH OR WITHOUT PELVIS) 2-3V RIGHT COMPARISON:  None. FINDINGS: There is a right femoral neck fracture with varus angulation. No subluxation or dislocation. Hip joints and SI joints symmetric and unremarkable. IMPRESSION: Right femoral neck fracture with varus angulation. Electronically Signed   By: Charlett Nose M.D.   On: 03/19/2021 01:26     Assessment and Recommendation  65 y.o. female with known hypertension COPD tobacco abuse with atypical episodes in the past of chest pain noncardiac in nature tolerating surgical intervention without complication now at low risk for rehabilitation 1.  Continue rehabilitation from the orthopedic standpoint without restriction 2.  No further cardiac diagnostics necessary at this time 3.  Further medication management for  hypertension to control with same medications 4.  High intensity cholesterol therapy without change in medication management 5.  Okay for discharge to rehabilitation center without restriction and follow-up in 1 to 2 weeks  Signed, Arnoldo Hooker M.D. FACC

## 2021-03-21 NOTE — Progress Notes (Signed)
*  PRELIMINARY RESULTS* Echocardiogram 2D Echocardiogram has been performed.  Teresa Lang 03/21/2021, 10:44 AM

## 2021-03-21 NOTE — Progress Notes (Addendum)
As noted1       [facility]   PATIENT NAME: Teresa Lang    MR#:  [id number]  DATE OF BIRTH:  01/20/1956  SUBJECTIVE:  CHIEF COMPLAINT:   Chief Complaint  Patient presents with   Fall  Minimal pain at surgical site. REVIEW OF SYSTEMS:  Review of Systems  Constitutional:  Negative for diaphoresis, fever, malaise/fatigue and weight loss.  HENT:  Negative for ear discharge, ear pain, hearing loss, nosebleeds, sore throat and tinnitus.   Eyes:  Negative for blurred vision and pain.  Respiratory:  Negative for cough, hemoptysis, shortness of breath and wheezing.   Cardiovascular:  Negative for chest pain, palpitations, orthopnea and leg swelling.  Gastrointestinal:  Negative for abdominal pain, blood in stool, constipation, diarrhea, heartburn, nausea and vomiting.  Genitourinary:  Negative for dysuria, frequency and urgency.  Musculoskeletal:  Positive for falls and joint pain. Negative for back pain and myalgias.  Skin:  Negative for itching and rash.  Neurological:  Negative for dizziness, tingling, tremors, focal weakness, seizures, weakness and headaches.  Psychiatric/Behavioral:  Negative for depression. The patient is not nervous/anxious.   DRUG ALLERGIES:   Allergies  Allergen Reactions   Prednisone Anxiety   VITALS:  Blood pressure 131/60, pulse 88, temperature 99.6 F (37.6 C), temperature source Oral, resp. rate 16, height 5\' 5"  (1.651 m), weight 71.7 kg, SpO2 96 %. PHYSICAL EXAMINATION:  Physical Exam 65 year old female lying in the bed comfortably without any acute distress Lungs clear to auscultation bilaterally no wheezing rales rhonchi crepitation Cardiovascular S1-S2 normal no murmur rales or gallop Abdomen soft, benign Neuro alert and oriented, nonfocal exam Skin no rash or lesion Extremity right hip area dressing/incision clean and dry.  Moving foot well LABORATORY PANEL:  Female CBC Recent Labs  Lab 03/21/21 0534  WBC 7.9  HGB  11.4*  HCT 33.8*  PLT 131*    ------------------------------------------------------------------------------------------------------------------ Chemistries  Recent Labs  Lab 03/19/21 0130 03/21/21 0534  NA 137 137  K 3.6 3.8  CL 104 105  CO2 27 28  GLUCOSE 103* 108*  BUN 11 11  CREATININE 0.79 0.63  CALCIUM 9.8 9.0  AST 28  --   ALT 22  --   ALKPHOS 91  --   BILITOT 0.9  --     RADIOLOGY:  ECHOCARDIOGRAM COMPLETE  Result Date: 03/21/2021    ECHOCARDIOGRAM REPORT   Patient Name:   Teresa Lang Date of Exam: 03/21/2021 Medical Rec #:  03/23/2021               Height:       65.0 in Accession #:    [id number]              Weight:       158.0 lb Date of Birth:  02/27/1956               BSA:          1.790 m Patient Age:    65 years                BP:           132/77 mmHg Patient Gender: F                       HR:           91 bpm. Exam Location:  [facility] Procedure: 2D Echo, Color Doppler and Cardiac Doppler Indications:  R07.9 Chest Pain  History:         Patient has no prior history of Echocardiogram examinations.                  COPD; Risk Factors:Hypertension and Dyslipidemia.  Sonographer:     Humphrey Rolls RDCS (AE) Referring Phys:  [id number] [facility] Diagnosing Phys: Harold Hedge MD  Sonographer Comments: Suboptimal parasternal window. IMPRESSIONS  1. Left ventricular ejection fraction, by estimation, is 60 to 65%. The left ventricle has normal function. The left ventricle has no regional wall motion abnormalities. Left ventricular diastolic parameters are consistent with Grade I diastolic dysfunction (impaired relaxation).  2. Right ventricular systolic function is normal. The right ventricular size is normal.  3. The mitral valve is grossly normal. No evidence of mitral valve regurgitation.  4. The aortic valve was not well visualized. Aortic valve regurgitation is not visualized. FINDINGS  Left Ventricle: Left ventricular ejection fraction, by estimation, is 60 to 65%. The  left ventricle has normal function. The left ventricle has no regional wall motion abnormalities. The left ventricular internal cavity size was normal in size. There is  borderline left ventricular hypertrophy. Left ventricular diastolic parameters are consistent with Grade I diastolic dysfunction (impaired relaxation). Right Ventricle: The right ventricular size is normal. No increase in right ventricular wall thickness. Right ventricular systolic function is normal. Left Atrium: Left atrial size was normal in size. Right Atrium: Right atrial size was normal in size. Pericardium: There is no evidence of pericardial effusion. Mitral Valve: The mitral valve is grossly normal. No evidence of mitral valve regurgitation. MV peak gradient, 4.1 mmHg. The mean mitral valve gradient is 2.0 mmHg. Tricuspid Valve: The tricuspid valve is not well visualized. Tricuspid valve regurgitation is trivial. Aortic Valve: The aortic valve was not well visualized. Aortic valve regurgitation is not visualized. Aortic valve mean gradient measures 4.0 mmHg. Aortic valve peak gradient measures 7.3 mmHg. Aortic valve area, by VTI measures 3.08 cm. Pulmonic Valve: The pulmonic valve was not well visualized. Pulmonic valve regurgitation is trivial. Aorta: The aortic root is normal in size and structure. IAS/Shunts: The atrial septum is grossly normal.  LEFT VENTRICLE PLAX 2D LVIDd:         4.30 cm  Diastology LVIDs:         3.10 cm  LV e' medial:    6.20 cm/s LV PW:         1.10 cm  LV E/e' medial:  11.5 LV IVS:        0.80 cm  LV e' lateral:   9.25 cm/s LVOT diam:     2.30 cm  LV E/e' lateral: 7.7 LV SV:         71 LV SV Index:   40 LVOT Area:     4.15 cm  LEFT ATRIUM             Index LA diam:        2.60 cm 1.45 cm/m LA Vol (A2C):   22.9 ml 12.80 ml/m LA Vol (A4C):   23.7 ml 13.24 ml/m LA Biplane Vol: 25.5 ml 14.25 ml/m  AORTIC VALVE                   PULMONIC VALVE AV Area (Vmax):    3.60 cm    PV Vmax:       1.15 m/s AV Area  (Vmean):   3.02 cm    PV Vmean:  69.000 cm/s AV Area (VTI):     3.08 cm    PV VTI:        0.150 m AV Vmax:           135.00 cm/s PV Peak grad:  5.3 mmHg AV Vmean:          97.400 cm/s PV Mean grad:  2.0 mmHg AV VTI:            0.232 m AV Peak Grad:      7.3 mmHg AV Mean Grad:      4.0 mmHg LVOT Vmax:         117.00 cm/s LVOT Vmean:        70.800 cm/s LVOT VTI:          0.172 m LVOT/AV VTI ratio: 0.74  AORTA Ao Root diam: 3.10 cm MITRAL VALVE MV Area (PHT): 4.86 cm     SHUNTS MV Area VTI:   3.59 cm     Systemic VTI:  0.17 m MV Peak grad:  4.1 mmHg     Systemic Diam: 2.30 cm MV Mean grad:  2.0 mmHg MV Vmax:       1.01 m/s MV Vmean:      58.8 cm/s MV Decel Time: 156 msec MV E velocity: 71.00 cm/s MV A velocity: 108.00 cm/s MV E/A ratio:  0.66 Harold Hedge MD Electronically signed by Harold Hedge MD Signature Date/Time: 03/21/2021/12:38:57 PM    Final    DG HIP UNILAT W OR W/O PELVIS 2-3 VIEWS RIGHT  Result Date: 03/21/2021 CLINICAL DATA:  Status post right hip replacement EXAM: DG HIP (WITH OR WITHOUT PELVIS) 2-3V RIGHT COMPARISON:  None. FINDINGS: Right hip replacement is now seen in satisfactory position. Mild degenerative changes of the left hip joint are noted. No soft tissue abnormality is seen. IMPRESSION: Status post right hip replacement. Electronically Signed   By: Alcide Clever M.D.   On: 03/21/2021 08:29   ASSESSMENT AND PLAN:  65 year old female with history of COPD, hypertension, depression, chronic pain, rheumatoid arthritis and osteopenia presenting with right hip fracture from an accidental fall.     Right femoral neck fracture secondary to an accidental fall -S/p right hip hemiarthroplasty on 6/12.  POD 1 -PT, OT as able -Pain management and DVT prophylaxis per Ortho -Weightbearing as tolerated on the right leg - Cardiology was consulted for cardiac clearance this patient reported some chest pain and anesthesia requested clearance. -Patient is not having any chest pain at this  point     Hypertension, essential - Continue diltiazem and lisinopril     Rheumatoid arthritis (HCC) - Continue methotrexate     Chronic obstructive pulmonary disease, unspecified (HCC) - DuoNebs as needed     Depression -Continue Cymbalta, BuSpar, lorazepam, Abilify and lithium   Right ankle pain No fracture on x-ray.  Could be minor sprain from fall and injury  Body mass index is 26.29 kg/m.  Net IO Since Admission: 3,203.4 mL [03/21/21 1257]      Status is: Inpatient  Remains inpatient appropriate because:Ongoing diagnostic testing needed not appropriate for outpatient work up  Dispo: The patient is from: Home              Anticipated d/c is to: SNF versus home with home health              Patient currently is not medically stable to d/c.   Difficult to place patient No   DVT prophylaxis:  enoxaparin (LOVENOX) injection 40 mg Start: 03/21/21 1000 SCDs Start: 03/20/21 1517 Place TED hose Start: 03/20/21 1517   Lovenox    Family Communication: Sister updated at bedside on 6/13  All the records are reviewed and case discussed with Care Management/Social Worker. Management plans discussed with the patient, family and they are in agreement.  CODE STATUS: Full Code Level of care: Med-Surg  TOTAL TIME TAKING CARE OF THIS PATIENT: 35 minutes.   More than 50% of the time was spent in counseling/coordination of care: YES  POSSIBLE D/C IN 2-3 DAYS, DEPENDING ON CLINICAL CONDITION and postop recovery    Delfino Lovett M.D on 03/21/2021 at 12:57 PM  [facility]   CC: Primary care physician; Patrice Paradise, MD  Note: This dictation was prepared with Dragon dictation along with smaller phrase technology. Any transcriptional errors that result from this process are unintentional.

## 2021-03-21 NOTE — Progress Notes (Signed)
  Subjective: 1 Day Post-Op Procedure(s) (LRB): ARTHROPLASTY BIPOLAR HIP (HEMIARTHROPLASTY) (Right) Patient reports pain as moderate.   Patient is well, and has had no acute complaints or problems PT and care management to assist with discharge planning. Negative for chest pain and shortness of breath Fever: no Gastrointestinal:Negative for nausea and vomiting  Objective: Vital signs in last 24 hours: Temp:  [97.3 F (36.3 C)-99.8 F (37.7 C)] 97.8 F (36.6 C) (06/13 0432) Pulse Rate:  [84-109] 94 (06/13 0432) Resp:  [9-20] 20 (06/13 0432) BP: (113-169)/(51-81) 135/81 (06/13 0432) SpO2:  [85 %-97 %] 97 % (06/13 0432)  Intake/Output from previous day:  Intake/Output Summary (Last 24 hours) at 03/21/2021 0755 Last data filed at 03/21/2021 0519 Gross per 24 hour  Intake 3240 ml  Output 800 ml  Net 2440 ml    Intake/Output this shift: No intake/output data recorded.  Labs: Recent Labs    03/19/21 0130 03/21/21 0534  HGB 14.0 11.4*   Recent Labs    03/19/21 0130 03/21/21 0534  WBC 12.2* 7.9  RBC 4.48 3.54*  HCT 42.4 33.8*  PLT 268 131*   Recent Labs    03/19/21 0130 03/21/21 0534  NA 137 137  K 3.6 3.8  CL 104 105  CO2 27 28  BUN 11 11  CREATININE 0.79 0.63  GLUCOSE 103* 108*  CALCIUM 9.8 9.0   Recent Labs    03/19/21 0130  INR 1.1     EXAM General - Patient is Alert, Appropriate, and Oriented Extremity - ABD soft Neurovascular intact Sensation intact distally Intact pulses distally Dorsiflexion/Plantar flexion intact Incision: dressing C/D/I No cellulitis present Dressing/Incision - clean, dry, no drainage Motor Function - intact, moving foot and toes well on exam.  Abdomen soft with intact bowel sounds.  Past Medical History:  Diagnosis Date   Allergy    seasonal   Anxiety    Arthritis    Asthma    COPD (chronic obstructive pulmonary disease) (HCC)    Depression    Dyspnea    GERD (gastroesophageal reflux disease)     Hyperlipidemia    Hypertension     Assessment/Plan: 1 Day Post-Op Procedure(s) (LRB): ARTHROPLASTY BIPOLAR HIP (HEMIARTHROPLASTY) (Right) Principal Problem:   Fracture of femoral neck, right (HCC) Active Problems:   Hypertension, essential   Rheumatoid arthritis (HCC)   Chronic pain syndrome   Chronic obstructive pulmonary disease, unspecified (HCC)   Preoperative clearance   Accidental fall   Osteopenia of multiple sites   Depression   Closed right hip fracture (HCC)  Estimated body mass index is 26.29 kg/m as calculated from the following:   Height as of this encounter: 5\' 5"  (1.651 m).   Weight as of this encounter: 71.7 kg. Advance diet Up with therapy D/C IV fluids when tolerating po intake.  Labs reviewed this AM. Hg 11.4. Patient reports that she isnt passing gas this AM.  Continue to monitor, work on BM. Up with therapy today.  DVT Prophylaxis - Lovenox, Foot Pumps, and SCDs Weight-Bearing as tolerated to right leg  J. , PA-C [facility] 03/21/2021, 7:55 AM

## 2021-03-21 NOTE — Progress Notes (Signed)
Met with the patient to discuss DC plan and needs She lives with her sister who also provides transportation She would like to get a 3 in 1 and a RW to take home She is agreeable to [facility], I set her up with [facility] She can afford her medications

## 2021-03-21 NOTE — Evaluation (Signed)
Physical Therapy Evaluation Patient Details Name: Teresa Lang MRN: [id number] DOB: Oct 25, 1955 Today's Date: 03/21/2021   History of Present Illness  Teresa Lang is a 65 y.o. female with medical history significant for COPD, hypertension, depression, chronic pain, rheumatoid arthritis and osteopenia brought in by EMS following a fall onto her right hip with difficulty getting up thereafter.  Patient states she was walking up some stairs tripped and fell onto her right hip with sudden onset sharp pain.  Clinical Impression  Patient received in bed. She is agreeable to PT session. Patient reports 8/10 pain, Has received pain medicine prior to session. She requires min guard for bed mobility and transfers. Ambulated 15 feet with RW and min guard. Cues for use of RW and WB status. Patient will continue to benefit from skilled PT while here to improve functional independence and safety.    Follow Up Recommendations Home health PT    Equipment Recommendations  None recommended by PT    Recommendations for Other Services       Precautions / Restrictions Precautions Precautions: Fall;Posterior Hip Restrictions Weight Bearing Restrictions: Yes RLE Weight Bearing: Weight bearing as tolerated      Mobility  Bed Mobility Overal bed mobility: Needs Assistance Bed Mobility: Supine to Sit     Supine to sit: Min guard          Transfers Overall transfer level: Needs assistance Equipment used: Rolling walker (2 wheeled) Transfers: Sit to/from Stand Sit to Stand: Min guard            Ambulation/Gait Ambulation/Gait assistance: Min guard Gait Distance (Feet): 15 Feet Assistive device: Rolling walker (2 wheeled) Gait Pattern/deviations: Step-to pattern;Decreased weight shift to right;Antalgic Gait velocity: decr   General Gait Details: patient able to ambulate with RW and min guard. Cues for sequencing and use of AD.  Stairs            Wheelchair  Mobility    Modified Rankin (Stroke Patients Only)       Balance Overall balance assessment: Needs assistance Sitting-balance support: Feet supported Sitting balance-Leahy Scale: Good     Standing balance support: Bilateral upper extremity supported;During functional activity Standing balance-Leahy Scale: Fair Standing balance comment: reliant on B UE support                             Pertinent Vitals/Pain Pain Assessment: 0-10 Pain Score: 8  Pain Descriptors / Indicators: Aching;Discomfort;Sore Pain Intervention(s): Limited activity within patient's tolerance;Monitored during session;Premedicated before session;Repositioned    Home Living Family/patient expects to be discharged to:: Private residence Living Arrangements: Other relatives Available Help at Discharge: Family;Available PRN/intermittently Type of Home: House Home Access: Ramped entrance     Home Layout: One level        Prior Function Level of Independence: Independent         Comments: was not using AD prior to admission     Hand Dominance        Extremity/Trunk Assessment   Upper Extremity Assessment Upper Extremity Assessment: Defer to OT evaluation    Lower Extremity Assessment Lower Extremity Assessment: RLE deficits/detail RLE Sensation: WNL RLE Coordination: decreased gross motor    Cervical / Trunk Assessment Cervical / Trunk Assessment: Normal  Communication   Communication: No difficulties  Cognition Arousal/Alertness: Awake/alert Behavior During Therapy: WFL for tasks assessed/performed Overall Cognitive Status: Within Functional Limits for tasks assessed  General Comments      Exercises     Assessment/Plan    PT Assessment Patient needs continued PT services  PT Problem List Decreased strength;Decreased mobility;Decreased safety awareness;Decreased range of motion;Decreased activity  tolerance;Decreased balance;Pain;Decreased knowledge of use of DME;Decreased knowledge of precautions;Cardiopulmonary status limiting activity       PT Treatment Interventions DME instruction;Therapeutic exercise;Gait training;Balance training;Neuromuscular re-education;Functional mobility training;Therapeutic activities;Patient/family education    PT Goals (Current goals can be found in the Care Plan section)  Acute Rehab PT Goals Patient Stated Goal: to improve PT Goal Formulation: With patient Time For Goal Achievement: 03/28/21 Potential to Achieve Goals: Good    Frequency BID   Barriers to discharge Decreased caregiver support      Co-evaluation               AM-PAC PT "6 Clicks" Mobility  Outcome Measure Help needed turning from your back to your side while in a flat bed without using bedrails?: A Little Help needed moving from lying on your back to sitting on the side of a flat bed without using bedrails?: A Little Help needed moving to and from a bed to a chair (including a wheelchair)?: A Little Help needed standing up from a chair using your arms (e.g., wheelchair or bedside chair)?: A Little Help needed to walk in hospital room?: A Little Help needed climbing 3-5 steps with a railing? : A Lot 6 Click Score: 17    End of Session Equipment Utilized During Treatment: Gait belt;Oxygen Activity Tolerance: Patient limited by pain Patient left: in chair;with call bell/phone within reach;with chair alarm set Nurse Communication: Mobility status PT Visit Diagnosis: Other abnormalities of gait and mobility (R26.89);Muscle weakness (generalized) (M62.81);Pain;Difficulty in walking, not elsewhere classified (R26.2);History of falling (Z91.81) Pain - Right/Left: Right Pain - part of body: Hip    Time: 1030-1100 PT Time Calculation (min) (ACUTE ONLY): 30 min   Charges:   PT Evaluation $PT Eval Moderate Complexity: 1 Mod PT Treatments $Gait Training: 8-22 mins         [employer], PT, GCS 03/21/21,12:00 PM

## 2021-03-21 NOTE — Anesthesia Postprocedure Evaluation (Signed)
Anesthesia Post Note  Patient: Jakirah Zaun  Procedure(s) Performed: ARTHROPLASTY BIPOLAR HIP (HEMIARTHROPLASTY) (Right: Hip)  Patient location during evaluation: Nursing Unit Anesthesia Type: Spinal Level of consciousness: awake and alert and oriented Pain management: pain level controlled Vital Signs Assessment: post-procedure vital signs reviewed and stable Respiratory status: spontaneous breathing and respiratory function stable Cardiovascular status: stable Postop Assessment: no headache, no backache, patient able to bend at knees, no apparent nausea or vomiting, able to ambulate and adequate PO intake Anesthetic complications: no   No notable events documented.   Last Vitals:  Vitals:   03/20/21 1937 03/21/21 0432  BP: 131/71 135/81  Pulse: (!) 108 94  Resp: 18 20  Temp: 37.4 C 36.6 C  SpO2: 92% 97%    Last Pain:  Vitals:   03/21/21 0432  TempSrc: Oral  PainSc:                  Zachary George

## 2021-03-22 LAB — BASIC METABOLIC PANEL
Anion gap: 5 (ref 5–15)
BUN: 14 mg/dL (ref 8–23)
CO2: 28 mmol/L (ref 22–32)
Calcium: 9.1 mg/dL (ref 8.9–10.3)
Chloride: 104 mmol/L (ref 98–111)
Creatinine, Ser: 0.72 mg/dL (ref 0.44–1.00)
GFR, Estimated: >60 mL/min (ref >60)
Glucose, Bld: 106 mg/dL — ABNORMAL HIGH (ref 70–99)
Potassium: 3.7 mmol/L (ref 3.5–5.1)
Sodium: 137 mmol/L (ref 135–145)

## 2021-03-22 LAB — CBC
HCT: 30.7 % — ABNORMAL LOW (ref 36.0–46.0)
Hemoglobin: 10.4 g/dL — ABNORMAL LOW (ref 12.0–15.0)
MCH: 31.8 pg (ref 26.0–34.0)
MCHC: 33.9 g/dL (ref 30.0–36.0)
MCV: 93.9 fL (ref 80.0–100.0)
Platelets: 159 10*3/uL (ref 150–400)
RBC: 3.27 MIL/uL — ABNORMAL LOW (ref 3.87–5.11)
RDW: 13.1 % (ref 11.5–15.5)
WBC: 8.7 10*3/uL (ref 4.0–10.5)
nRBC: 0 % (ref 0.0–0.2)

## 2021-03-22 LAB — SURGICAL PATHOLOGY

## 2021-03-22 MED ORDER — SENNOSIDES-DOCUSATE SODIUM 8.6-50 MG PO TABS
1.0000 | ORAL_TABLET | Freq: Two times a day (BID) | ORAL | Status: DC
  Administered 2021-03-22 – 2021-03-23 (×3): 1 via ORAL
  Filled 2021-03-22 (×3): qty 1

## 2021-03-22 MED ORDER — ACETAMINOPHEN 325 MG PO TABS
650.0000 mg | ORAL_TABLET | Freq: Four times a day (QID) | ORAL | Status: DC | PRN
  Administered 2021-03-23: 650 mg via ORAL
  Filled 2021-03-22: qty 2

## 2021-03-22 MED ORDER — OXYCODONE HCL 5 MG PO TABS
5.0000 mg | ORAL_TABLET | ORAL | Status: DC | PRN
Start: 2021-03-22 — End: 2021-03-23

## 2021-03-22 MED ORDER — BISACODYL 10 MG RE SUPP: 10.0000 mg | Freq: Every day | RECTAL | Status: DC | PRN

## 2021-03-22 MED ORDER — POLYETHYLENE GLYCOL 3350 17 G PO PACK
17.0000 g | PACK | Freq: Every day | ORAL | Status: DC
  Administered 2021-03-22 – 2021-03-23 (×2): 17 g via ORAL
  Filled 2021-03-22 (×2): qty 1

## 2021-03-22 NOTE — Progress Notes (Signed)
PROGRESS NOTE    Teresa Lang  ZOX:[id number] DOB: 03-Oct-1956 DOA: 03/19/2021 PCP: Patrice Paradise, MD  Brief Narrative:   65 y.o. female with medical history significant for COPD, hypertension, depression, chronic pain, rheumatoid arthritis and osteopenia brought in by EMS following a fall onto her right hip with difficulty getting up thereafter.  Patient states she was walking up some stairs tripped and fell onto her right hip with sudden onset sharp pain.  She denies hitting her head and had no loss of consciousness.  She was previously in her usual state of health.  Denied recent illness, no chest pain, shortness of breath, palpitations or lightheadedness.  Denied visual disturbance or headache, one-sided weakness numbness or tingling prior to the fall.  Still having some postoperative pain. No BM reported yet Working physical therapy, ambulation limited by pain  Assessment & Plan:   Principal Problem:   Fracture of femoral neck, right (HCC) Active Problems:   Hypertension, essential   Rheumatoid arthritis (HCC)   Chronic pain syndrome   Chronic obstructive pulmonary disease, unspecified (HCC)   Preoperative clearance   Accidental fall   Osteopenia of multiple sites   Depression   Closed right hip fracture (HCC)  Right femoral neck fracture secondary to an accidental fall S/p right hip hemiarthroplasty on 6/12.  POD 2 Endorsing some postoperative pain, most present when trying to ambulate No BM yet as of 6/14 Plan: Multimodal pain control Bowel regimen Therapy as able VTE prophylaxis Discharge pending BM and improvement in pain     Hypertension, essential - Continue diltiazem and lisinopril     Rheumatoid arthritis (HCC) - Continue methotrexate     Chronic obstructive pulmonary disease, unspecified (HCC) - DuoNebs as needed     Depression -Continue Cymbalta, BuSpar, lorazepam, Abilify and lithium   Right ankle pain No fracture on x-ray.  Could  be minor sprain from fall and injury   DVT prophylaxis: SQ Lovenox Code Status: Full Family Communication: None today Disposition Plan: Status is: Inpatient  Remains inpatient appropriate because:Inpatient level of care appropriate due to severity of illness  Dispo:  Patient From: Home  Planned Disposition: To be determined  Medically stable for discharge: No     Pending improvement in pain and postoperative bowel movement.  Disposition likely home with home health or skilled nursing facility    Level of care: Med-Surg  Consultants:  Orthopedics  Procedures:  Hip fracture fixation, 6/12  Antimicrobials:  None   Subjective: Seen and examined.  Overall pain controlled while stationary however does exacerbate when trying to ambulate.  Objective: Vitals:   03/21/21 2150 03/22/21 0546 03/22/21 0913 03/22/21 1130  BP: 124/63 130/73 128/65 132/67  Pulse: 96 87 85 92  Resp: Temp: 98.9 F (37.2 C) 99.3 F (37.4 C) 99 F (37.2 C) 98.8 F (37.1 C)  TempSrc: Oral  Oral   SpO2: 91% 92% 93% 91%  Weight:      Height:        Intake/Output Summary (Last 24 hours) at 03/22/2021 1326 Last data filed at 03/21/2021 1847 Gross per 24 hour  Intake 480 ml  Output --  Net 480 ml   Filed Weights   03/19/21 0038  Weight: 71.7 kg    Examination:  General exam: Appears calm and comfortable  Respiratory system: Clear to auscultation. Respiratory effort normal. Cardiovascular system: S1 & S2 heard, RRR. No JVD, murmurs, rubs, gallops or clicks. No pedal edema. Gastrointestinal system: Abdomen is  nondistended, soft and nontender. No organomegaly or masses felt. Normal bowel sounds heard. Central nervous system: Alert and oriented. No focal neurological deficits. Extremities: Right lower extremity decreased range of motion Skin: No rashes, lesions or ulcers Psychiatry: Judgement and insight appear normal. Mood & affect appropriate.     Data Reviewed: I have  personally reviewed following labs and imaging studies  CBC: Recent Labs  Lab 03/19/21 0130 03/21/21 0534 03/22/21 0356  WBC 12.2* 7.9 8.7  NEUTROABS 9.5*  --   --   HGB 14.0 11.4* 10.4*  HCT 42.4 33.8* 30.7*  MCV 94.6 95.5 93.9  PLT 268 131* 159   Basic Metabolic Panel: Recent Labs  Lab 03/19/21 0130 03/21/21 0534 03/22/21 0356  NA 137 137 137  K 3.6 3.8 3.7  CL 104 105 104  CO2 GLUCOSE 103* 108* 106*  BUN CREATININE 0.79 0.63 0.72  CALCIUM 9.8 9.0 9.1   GFR: Estimated Creatinine Clearance: 68.7 mL/min (by C-G formula based on SCr of 0.72 mg/dL). Liver Function Tests: Recent Labs  Lab 03/19/21 0130  AST 28  ALT 22  ALKPHOS 91  BILITOT 0.9  PROT 6.9  ALBUMIN 3.8   No results for input(s): LIPASE, AMYLASE in the last 168 hours. No results for input(s): AMMONIA in the last 168 hours. Coagulation Profile: Recent Labs  Lab 03/19/21 0130  INR 1.1   Cardiac Enzymes: No results for input(s): CKTOTAL, CKMB, CKMBINDEX, TROPONINI in the last 168 hours. BNP (last 3 results) No results for input(s): PROBNP in the last 8760 hours. HbA1C: No results for input(s): HGBA1C in the last 72 hours. CBG: No results for input(s): GLUCAP in the last 168 hours. Lipid Profile: No results for input(s): CHOL, HDL, LDLCALC, TRIG, CHOLHDL, LDLDIRECT in the last 72 hours. Thyroid Function Tests: No results for input(s): TSH, T4TOTAL, FREET4, T3FREE, THYROIDAB in the last 72 hours. Anemia Panel: No results for input(s): VITAMINB12, FOLATE, FERRITIN, TIBC, IRON, RETICCTPCT in the last 72 hours. Sepsis Labs: No results for input(s): PROCALCITON, LATICACIDVEN in the last 168 hours.  Recent Results (from the past 240 hour(s))  Resp Panel by RT-PCR (Flu A&B, Covid) Nasopharyngeal Swab     Status: None   Collection Time: 03/19/21  2:30 AM   Specimen: Nasopharyngeal Swab; Nasopharyngeal(NP) swabs in vial transport medium  Result Value Ref Range Status   SARS  Coronavirus 2 by RT PCR NEGATIVE NEGATIVE Final    Comment: (NOTE) SARS-CoV-2 target nucleic acids are NOT DETECTED.  The SARS-CoV-2 RNA is generally detectable in upper respiratory specimens during the acute phase of infection. The lowest concentration of SARS-CoV-2 viral copies this assay can detect is 138 copies/mL. A negative result does not preclude SARS-Cov-2 infection and should not be used as the sole basis for treatment or other patient management decisions. A negative result may occur with  improper specimen collection/handling, submission of specimen other than nasopharyngeal swab, presence of viral mutation(s) within the areas targeted by this assay, and inadequate number of viral copies(<138 copies/mL). A negative result must be combined with clinical observations, patient history, and epidemiological information. The expected result is Negative.  Fact Sheet for Patients:  BloggerCourse.com  Fact Sheet for Healthcare Providers:  SeriousBroker.it  This test is no t yet approved or cleared by the [place] FDA and  has been authorized for detection and/or diagnosis of SARS-CoV-2 by FDA under an Emergency Use Authorization (EUA). This EUA will remain  in effect (meaning this test  can be used) for the duration of the COVID-19 declaration under Section 564(b)(1) of the Act, 21 U.S.C.section 360bbb-3(b)(1), unless the authorization is terminated  or revoked sooner.       Influenza A by PCR NEGATIVE NEGATIVE Final   Influenza B by PCR NEGATIVE NEGATIVE Final    Comment: (NOTE) The Xpert Xpress SARS-CoV-2/FLU/RSV plus assay is intended as an aid in the diagnosis of influenza from Nasopharyngeal swab specimens and should not be used as a sole basis for treatment. Nasal washings and aspirates are unacceptable for Xpert Xpress SARS-CoV-2/FLU/RSV testing.  Fact Sheet for  Patients: BloggerCourse.com  Fact Sheet for Healthcare Providers: SeriousBroker.it  This test is not yet approved or cleared by the [place] FDA and has been authorized for detection and/or diagnosis of SARS-CoV-2 by FDA under an Emergency Use Authorization (EUA). This EUA will remain in effect (meaning this test can be used) for the duration of the COVID-19 declaration under Section 564(b)(1) of the Act, 21 U.S.C. section 360bbb-3(b)(1), unless the authorization is terminated or revoked.  Performed at [facility], [address], [place], [place] [address]          Radiology Studies: ECHOCARDIOGRAM COMPLETE  Result Date: 03/21/2021    ECHOCARDIOGRAM REPORT   Patient Name:   [facility] Date of Exam: 03/21/2021 Medical Rec #:  [id number]               Height:       65.0 in Accession #:    [id number]              Weight:       158.0 lb Date of Birth:  02-13-1956               BSA:          1.790 m Patient Age:    65 years                BP:           132/77 mmHg Patient Gender: F                       HR:           91 bpm. Exam Location:  [facility] Procedure: 2D Echo, Color Doppler and Cardiac Doppler Indications:     R07.9 Chest Pain  History:         Patient has no prior history of Echocardiogram examinations.                  COPD; Risk Factors:Hypertension and Dyslipidemia.  Sonographer:     Humphrey Rolls RDCS (AE) Referring Phys:  [id number] Excell Seltzer [facility] Diagnosing Phys: Harold Hedge MD  Sonographer Comments: Suboptimal parasternal window. IMPRESSIONS  1. Left ventricular ejection fraction, by estimation, is 60 to 65%. The left ventricle has normal function. The left ventricle has no regional wall motion abnormalities. Left ventricular diastolic parameters are consistent with Grade I diastolic dysfunction (impaired relaxation).  2. Right ventricular systolic function is normal. The right ventricular size is normal.  3. The  mitral valve is grossly normal. No evidence of mitral valve regurgitation.  4. The aortic valve was not well visualized. Aortic valve regurgitation is not visualized. FINDINGS  Left Ventricle: Left ventricular ejection fraction, by estimation, is 60 to 65%. The left ventricle has normal function. The left ventricle has no regional wall motion abnormalities. The left ventricular internal cavity size was normal in size. There  is  borderline left ventricular hypertrophy. Left ventricular diastolic parameters are consistent with Grade I diastolic dysfunction (impaired relaxation). Right Ventricle: The right ventricular size is normal. No increase in right ventricular wall thickness. Right ventricular systolic function is normal. Left Atrium: Left atrial size was normal in size. Right Atrium: Right atrial size was normal in size. Pericardium: There is no evidence of pericardial effusion. Mitral Valve: The mitral valve is grossly normal. No evidence of mitral valve regurgitation. MV peak gradient, 4.1 mmHg. The mean mitral valve gradient is 2.0 mmHg. Tricuspid Valve: The tricuspid valve is not well visualized. Tricuspid valve regurgitation is trivial. Aortic Valve: The aortic valve was not well visualized. Aortic valve regurgitation is not visualized. Aortic valve mean gradient measures 4.0 mmHg. Aortic valve peak gradient measures 7.3 mmHg. Aortic valve area, by VTI measures 3.08 cm. Pulmonic Valve: The pulmonic valve was not well visualized. Pulmonic valve regurgitation is trivial. Aorta: The aortic root is normal in size and structure. IAS/Shunts: The atrial septum is grossly normal.  LEFT VENTRICLE PLAX 2D LVIDd:         4.30 cm  Diastology LVIDs:         3.10 cm  LV e' medial:    6.20 cm/s LV PW:         1.10 cm  LV E/e' medial:  11.5 LV IVS:        0.80 cm  LV e' lateral:   9.25 cm/s LVOT diam:     2.30 cm  LV E/e' lateral: 7.7 LV SV:         71 LV SV Index:   40 LVOT Area:     4.15 cm  LEFT ATRIUM              Index LA diam:        2.60 cm 1.45 cm/m LA Vol (A2C):   22.9 ml 12.80 ml/m LA Vol (A4C):   23.7 ml 13.24 ml/m LA Biplane Vol: 25.5 ml 14.25 ml/m  AORTIC VALVE                   PULMONIC VALVE AV Area (Vmax):    3.60 cm    PV Vmax:       1.15 m/s AV Area (Vmean):   3.02 cm    PV Vmean:      69.000 cm/s AV Area (VTI):     3.08 cm    PV VTI:        0.150 m AV Vmax:           135.00 cm/s PV Peak grad:  5.3 mmHg AV Vmean:          97.400 cm/s PV Mean grad:  2.0 mmHg AV VTI:            0.232 m AV Peak Grad:      7.3 mmHg AV Mean Grad:      4.0 mmHg LVOT Vmax:         117.00 cm/s LVOT Vmean:        70.800 cm/s LVOT VTI:          0.172 m LVOT/AV VTI ratio: 0.74  AORTA Ao Root diam: 3.10 cm MITRAL VALVE MV Area (PHT): 4.86 cm     SHUNTS MV Area VTI:   3.59 cm     Systemic VTI:  0.17 m MV Peak grad:  4.1 mmHg     Systemic Diam: 2.30 cm MV Mean grad:  2.0 mmHg MV Vmax:  1.01 m/s MV Vmean:      58.8 cm/s MV Decel Time: 156 msec MV E velocity: 71.00 cm/s MV A velocity: 108.00 cm/s MV E/A ratio:  0.66 Harold Hedge MD Electronically signed by Harold Hedge MD Signature Date/Time: 03/21/2021/12:38:57 PM    Final    DG HIP UNILAT W OR W/O PELVIS 2-3 VIEWS RIGHT  Result Date: 03/21/2021 CLINICAL DATA:  Status post right hip replacement EXAM: DG HIP (WITH OR WITHOUT PELVIS) 2-3V RIGHT COMPARISON:  None. FINDINGS: Right hip replacement is now seen in satisfactory position. Mild degenerative changes of the left hip joint are noted. No soft tissue abnormality is seen. IMPRESSION: Status post right hip replacement. Electronically Signed   By: Alcide Clever M.D.   On: 03/21/2021 08:29        Scheduled Meds:  ARIPiprazole  10 mg Oral QHS   atorvastatin  20 mg Oral Daily   busPIRone  10 mg Oral TID   darifenacin  7.5 mg Oral Daily   diltiazem  240 mg Oral Daily   docusate sodium  100 mg Oral BID   DULoxetine  60 mg Oral BID   enoxaparin (LOVENOX) injection  40 mg Subcutaneous Q24H   feeding supplement  237 mL  Oral Q24H   fluticasone furoate-vilanterol  1 puff Inhalation Daily   And   umeclidinium bromide  1 puff Inhalation Daily   folic acid  1 mg Oral Daily   lisinopril  20 mg Oral Daily   lithium carbonate  300 mg Oral QHS   multivitamin with minerals  1 tablet Oral Daily   oxybutynin  10 mg Oral QHS   pantoprazole  40 mg Oral Daily   potassium chloride  10 mEq Oral Daily   Continuous Infusions:  sodium chloride Stopped (03/21/21 1234)   sodium chloride       LOS: 3 days    Time spent: 25 minutes    Tresa Moore, MD [facility] Pager [phone]  If 7PM-7AM, please contact night-coverage 03/22/2021, 1:26 PM

## 2021-03-22 NOTE — Progress Notes (Signed)
Physical Therapy Treatment Patient Details Name: Teresa Lang MRN: [id number] DOB: 28-Sep-1956 Today's Date: 03/22/2021    History of Present Illness Teresa Lang is a 65 y.o. female with medical history significant for COPD, hypertension, depression, chronic pain, rheumatoid arthritis and osteopenia brought in by EMS following a fall onto her right hip with difficulty getting up thereafter.  Patient states she was walking up some stairs tripped and fell onto her right hip with sudden onset sharp pain. S/P R hemiarthroplasty, WBAT    PT Comments    Patient received in bed, reports she got up to use BSC independently. She says she called, but no one came. Reports bed pad is wet and needs it changed. Assisted patient up to recliner to get out of wet bed. Returned later after patient had pain medicine. She continues to report 8/10 pain in right LE. Requires cues for safety with transfers. Min guard for transfers, bed mobility and ambulation of 25 feet. Ambulation limited by pain level. She will continue to benefit from skilled PT while here to improve functional independence and safety with mobility.      Follow Up Recommendations  Home health PT;Supervision for mobility/OOB     Equipment Recommendations  None recommended by PT    Recommendations for Other Services       Precautions / Restrictions Precautions Precautions: Posterior Hip;Fall Restrictions Weight Bearing Restrictions: Yes RLE Weight Bearing: Weight bearing as tolerated    Mobility  Bed Mobility Overal bed mobility: Needs Assistance Bed Mobility: Sit to Supine       Sit to supine: Min guard   General bed mobility comments: min guard to assist R LE up onto bed and for positioning in bed    Transfers Overall transfer level: Needs assistance Equipment used: Rolling walker (2 wheeled) Transfers: Sit to/from Stand Sit to Stand: Min guard         General transfer comment: cues for hand  placement/safety  Ambulation/Gait Ambulation/Gait assistance: Min guard Gait Distance (Feet): 25 Feet Assistive device: Rolling walker (2 wheeled) Gait Pattern/deviations: Step-to pattern;Decreased step length - right;Decreased step length - left;Decreased weight shift to right Gait velocity: decr   General Gait Details: patient able to ambulate with RW and min guard. Cues for sequencing and use of AD.  Pain limited   Stairs             Wheelchair Mobility    Modified Rankin (Stroke Patients Only)       Balance Overall balance assessment: Needs assistance Sitting-balance support: Feet supported Sitting balance-Leahy Scale: Good     Standing balance support: Bilateral upper extremity supported;During functional activity Standing balance-Leahy Scale: Fair Standing balance comment: reliant on B UE support                            Cognition Arousal/Alertness: Lethargic;Suspect due to medications Behavior During Therapy: [facility] for tasks assessed/performed Overall Cognitive Status: Within Functional Limits for tasks assessed                                        Exercises Total Joint Exercises Ankle Circles/Pumps: AROM;Both;10 reps Quad Sets: AROM;Both;10 reps Gluteal Sets: AROM;Both;10 reps Heel Slides: AAROM;Right;10 reps Hip ABduction/ADduction: AROM;Right;10 reps    General Comments        Pertinent Vitals/Pain Pain Assessment: 0-10 Pain Score: 8  Pain Location:  R hip Pain Descriptors / Indicators: Aching;Discomfort;Sore Pain Intervention(s): Monitored during session;Premedicated before session;Repositioned;Limited activity within patient's tolerance    Home Living                      Prior Function            PT Goals (current goals can now be found in the care plan section) Acute Rehab PT Goals Patient Stated Goal: to improve PT Goal Formulation: With patient Time For Goal Achievement: 03/28/21 Potential  to Achieve Goals: Good Progress towards PT goals: Progressing toward goals    Frequency    BID      PT Plan Current plan remains appropriate    Co-evaluation              AM-PAC PT "6 Clicks" Mobility   Outcome Measure  Help needed turning from your back to your side while in a flat bed without using bedrails?: None Help needed moving from lying on your back to sitting on the side of a flat bed without using bedrails?: A Little Help needed moving to and from a bed to a chair (including a wheelchair)?: A Little Help needed standing up from a chair using your arms (e.g., wheelchair or bedside chair)?: A Little Help needed to walk in hospital room?: A Little Help needed climbing 3-5 steps with a railing? : A Lot 6 Click Score: 18    End of Session Equipment Utilized During Treatment: Gait belt Activity Tolerance: Patient limited by pain;Patient limited by fatigue Patient left: in bed;with call bell/phone within reach;with bed alarm set;with SCD's reapplied Nurse Communication: Mobility status PT Visit Diagnosis: Other abnormalities of gait and mobility (R26.89);Muscle weakness (generalized) (M62.81);Pain;Difficulty in walking, not elsewhere classified (R26.2);History of falling (Z91.81) Pain - Right/Left: Right Pain - part of body: Hip     Time: 1050 (+ 8 min earlier to get up to recliner)-1111 PT Time Calculation (min) (ACUTE ONLY): 21 min  Charges:  $Gait Training: 8-22 mins $Therapeutic Exercise: 8-22 mins                    [employer], PT, GCS 03/22/21,11:28 AM

## 2021-03-22 NOTE — Evaluation (Signed)
Occupational Therapy Evaluation Patient Details Name: Teresa Lang MRN: [id number] DOB: 02-13-1956 Today's Date: 03/22/2021    History of Present Illness Teresa Lang is a 65 y.o. female with medical history significant for COPD, hypertension, depression, chronic pain, rheumatoid arthritis and osteopenia brought in by EMS following a fall onto her right hip with difficulty getting up thereafter.  Patient states she was walking up some stairs tripped and fell onto her right hip with sudden onset sharp pain. S/P R hemiarthroplasty, WBAT   Clinical Impression   Pt seen for OT evaluation this date, POD#1 from above surgery. Pt was independent in all ADL prior to surgery. Pt is eager to return to PLOF with less pain and improved safety and independence. Pt currently requires MIN - MOD assist for LB ADL while in seated position due to pain and limited AROM of R hip as well as poor recall of hip precautions. Pt able to recall 0/3 posterior total hip precautions at start of session and unable to verbalize how to implement during ADL and mobility. Pt educated extensively in posterior hip precautions and how to maintain during ADL/mobility with verbal instruction, visual demo, and teach back. Pt also instructed in self care skills, falls prevention strategies, home/routines modifications, and DME/AE for LB bathing and dressing tasks At end of session, pt able to recall 2/3 posterior total hip precautions with cues. Handout provided to support recall. Pt would benefit from additional instruction in self care skills and techniques to help maintain precautions with or without assistive devices to support recall and carryover prior to discharge. Recommend HHOT and intermittent supervision/family assist upon discharge.       Follow Up Recommendations  Home health OT;Supervision - Intermittent    Equipment Recommendations  3 in 1 bedside commode    Recommendations for Other Services        Precautions / Restrictions Precautions Precautions: Posterior Hip;Fall Precaution Booklet Issued: No Precaution Comments: Pt unable to recall any precautions; additional education provided to support recall and carry over Restrictions Weight Bearing Restrictions: Yes RLE Weight Bearing: Weight bearing as tolerated      Mobility Bed Mobility    General bed mobility comments: NT, up in recliner    Transfers          General transfer comment: pt declined 2/2 R hip pain    Balance Overall balance assessment: Needs assistance Sitting-balance support: Feet supported Sitting balance-Leahy Scale: Good     Standing balance support: Bilateral upper extremity supported;During functional activity Standing balance-Leahy Scale: Fair Standing balance comment: reliant on B UE support                           ADL either performed or assessed with clinical judgement   ADL Overall ADL's : Needs assistance/impaired                                       General ADL Comments: 2/2 R hip pain and hip precautions, pt requires MIN-MOD A for LB ADL tasks, anticipate CGA for ADL transfers with VC for precautions + RW     Vision Baseline Vision/History: Wears glasses Wears Glasses: At all times Patient Visual Report: No change from baseline       Perception     Praxis      Pertinent Vitals/Pain Pain Assessment: 0-10 Pain Score: 8  Pain  Location: R hip Pain Descriptors / Indicators: Aching;Discomfort;Sore Pain Intervention(s): Limited activity within patient's tolerance;Monitored during session;Premedicated before session;Repositioned;Patient requesting pain meds-RN notified     Hand Dominance Right   Extremity/Trunk Assessment Upper Extremity Assessment Upper Extremity Assessment: Generalized weakness   Lower Extremity Assessment Lower Extremity Assessment: RLE deficits/detail RLE Deficits / Details: s/p hip hemi RLE: Unable to fully assess due  to pain       Communication Communication Communication: No difficulties   Cognition Arousal/Alertness: Lethargic;Suspect due to medications Behavior During Therapy: [facility] for tasks assessed/performed Overall Cognitive Status: No family/caregiver present to determine baseline cognitive functioning                                 General Comments: Pt appeared groggy, a bit slow to respond/process, and disoriented to year   General Comments       Exercises Other Exercises: Pt educated extensively in posterior hip precautions and how to maintain during ADL/mobility with verbal instruction, visual demo, and teach back. Pt will benefit from additional education to support recall and carryover to maximize safety.   Shoulder Instructions      Home Living Family/patient expects to be discharged to:: Private residence Living Arrangements: Other relatives Available Help at Discharge: Family;Available PRN/intermittently Type of Home: House Home Access: Ramped entrance     Home Layout: One level     Bathroom Shower/Tub: Chief Strategy Officer: Handicapped height                Prior Functioning/Environment Level of Independence: Independent;Needs assistance  Gait / Transfers Assistance Needed: was not using AD prior to admission ADL's / Homemaking Assistance Needed: pt indep with basic ADL; sister did most of the driving; gdtr assisted with groceries; pt indep with med mgt   Comments: pt endorses this 1 fall in past 72mo        OT Problem List: Decreased strength;Decreased range of motion;Pain;Decreased safety awareness;Decreased cognition;Decreased knowledge of use of DME or AE;Decreased knowledge of precautions      OT Treatment/Interventions: Self-care/ADL training;Therapeutic exercise;Therapeutic activities;DME and/or AE instruction;Patient/family education;Balance training    OT Goals(Current goals can be found in the care plan section) Acute  Rehab OT Goals Patient Stated Goal: have less pain and go home OT Goal Formulation: With patient Time For Goal Achievement: 04/05/21 Potential to Achieve Goals: Good ADL Goals Pt Will Perform Lower Body Dressing: with supervision;with set-up;with adaptive equipment;sit to/from stand (maintaining posterior THPs) Pt Will Transfer to Toilet: with supervision;ambulating (elevated commode, LRAD for amb, maintaining posterior THPs) Additional ADL Goal #1: Pt will verbalize 3/3 precautions and how to maintain during ADL/mobility with PRN VC for safety. Additional ADL Goal #2: Pt will verbalize plan to implement at least 1 learned falls prevention strategy.  OT Frequency: Min 2X/week   Barriers to D/C:            Co-evaluation              AM-PAC OT "6 Clicks" Daily Activity     Outcome Measure Help from another person eating meals?: None Help from another person taking care of personal grooming?: None Help from another person toileting, which includes using toliet, bedpan, or urinal?: A Little Help from another person bathing (including washing, rinsing, drying)?: A Lot Help from another person to put on and taking off regular upper body clothing?: None Help from another person to put on and taking off  regular lower body clothing?: A Lot 6 Click Score: 19   End of Session Nurse Communication: Patient requests pain meds  Activity Tolerance: Patient limited by pain Patient left: in chair;with call bell/phone within reach;with chair alarm set;Other (comment) (pillow between legs to support hip precautions)  OT Visit Diagnosis: Other abnormalities of gait and mobility (R26.89);Muscle weakness (generalized) (M62.81);Pain Pain - Right/Left: Right Pain - part of body: Hip                Time: 1012-1033 OT Time Calculation (min): 21 min Charges:  OT General Charges $OT Visit: 1 Visit OT Evaluation $OT Eval Moderate Complexity: 1 Mod OT Treatments $Self Care/Home Management : 8-22  mins  Wynona Canes, MPH, MS, OTR/L ascom [phone] 03/22/21, 11:47 AM

## 2021-03-22 NOTE — Progress Notes (Signed)
  Subjective: 2 Days Post-Op Procedure(s) (LRB): ARTHROPLASTY BIPOLAR HIP (HEMIARTHROPLASTY) (Right) Patient reports pain as moderate.   Patient is well, and has had no acute complaints or problems PT and care management to assist with discharge planning.  Current plan is for d/c home with HHPT pending continued progress. Negative for chest pain and shortness of breath Fever: no Gastrointestinal:Negative for nausea and vomiting  Objective: Vital signs in last 24 hours: Temp:  [98.7 F (37.1 C)-99.3 F (37.4 C)] 99.1 F (37.3 C) (06/14 1537) Pulse Rate:  [85-96] 85 (06/14 1537) Resp:  [15-18] 15 (06/14 1537) BP: (107-132)/(61-73) 107/61 (06/14 1537) SpO2:  [91 %-96 %] 91 % (06/14 1537)  Intake/Output from previous day:  Intake/Output Summary (Last 24 hours) at 03/22/2021 1553 Last data filed at 03/21/2021 1847 Gross per 24 hour  Intake 120 ml  Output --  Net 120 ml    Intake/Output this shift: No intake/output data recorded.  Labs: Recent Labs    03/21/21 0534 03/22/21 0356  HGB 11.4* 10.4*   Recent Labs    03/21/21 0534 03/22/21 0356  WBC 7.9 8.7  RBC 3.54* 3.27*  HCT 33.8* 30.7*  PLT 131* 159   Recent Labs    03/21/21 0534 03/22/21 0356  NA 137 137  K 3.8 3.7  CL 105 104  CO2 28 28  BUN 11 14  CREATININE 0.63 0.72  GLUCOSE 108* 106*  CALCIUM 9.0 9.1   No results for input(s): LABPT, INR in the last 72 hours.   EXAM General - Patient is Alert, Appropriate, and Oriented Extremity - ABD soft Neurovascular intact Sensation intact distally Intact pulses distally Dorsiflexion/Plantar flexion intact Incision: dressing C/D/I No cellulitis present Dressing/Incision - clean, dry, no drainage Motor Function - intact, moving foot and toes well on exam.  Abdomen soft with intact bowel sounds.  Past Medical History:  Diagnosis Date   Allergy    seasonal   Anxiety    Arthritis    Asthma    COPD (chronic obstructive pulmonary disease) (HCC)     Depression    Dyspnea    GERD (gastroesophageal reflux disease)    Hyperlipidemia    Hypertension     Assessment/Plan: 2 Days Post-Op Procedure(s) (LRB): ARTHROPLASTY BIPOLAR HIP (HEMIARTHROPLASTY) (Right) Principal Problem:   Fracture of femoral neck, right (HCC) Active Problems:   Hypertension, essential   Rheumatoid arthritis (HCC)   Chronic pain syndrome   Chronic obstructive pulmonary disease, unspecified (HCC)   Preoperative clearance   Accidental fall   Osteopenia of multiple sites   Depression   Closed right hip fracture (HCC)  Estimated body mass index is 26.29 kg/m as calculated from the following:   Height as of this encounter: 5\' 5"  (1.651 m).   Weight as of this encounter: 71.7 kg. Advance diet Up with therapy D/C IV fluids when tolerating po intake.  Labs reviewed this AM. Hg 10.4. Patient is passing gas this morning, continue to work on BM. Plan for possible d/c home with HHPT tomorrow pending progress and BM.  DVT Prophylaxis - Lovenox, Foot Pumps, and SCDs Weight-Bearing as tolerated to right leg  J. , PA-C [facility] 03/22/2021, 3:53 PM

## 2021-03-22 NOTE — Progress Notes (Signed)
Physical Therapy Treatment Patient Details Name: Teresa Lang MRN: [id number] DOB: 05-03-1956 Today's Date: 03/22/2021    History of Present Illness Teresa Lang is a 65 y.o. female with medical history significant for COPD, hypertension, depression, chronic pain, rheumatoid arthritis and osteopenia brought in by EMS following a fall onto her right hip with difficulty getting up thereafter.  Patient states she was walking up some stairs tripped and fell onto her right hip with sudden onset sharp pain. S/P R hemiarthroplasty, WBAT    PT Comments    Patient received in bed, reports she had been napping. Patient is groggy and slow to respond. She is mod independent with bed mobility, transfers with supervision. Patient is able to ambulate 50 feet with RW and min guard. Pain limited. She is making slow, steady progress. Patient will continue to benefit from skilled PT while here to improve functional independence and strength.      Follow Up Recommendations  Home health PT;Supervision for mobility/OOB     Equipment Recommendations  None recommended by PT    Recommendations for Other Services       Precautions / Restrictions Precautions Precautions: Posterior Hip;Fall Precaution Booklet Issued: No Precaution Comments: patient requires continued cues for hip precautions Restrictions Weight Bearing Restrictions: Yes RLE Weight Bearing: Weight bearing as tolerated    Mobility  Bed Mobility Overal bed mobility: Modified Independent Bed Mobility: Supine to Sit     Supine to sit: Modified independent (Device/Increase time) Sit to supine: Min guard   General bed mobility comments: min guard for bringing R LE back up onto bed    Transfers Overall transfer level: Needs assistance Equipment used: Rolling walker (2 wheeled) Transfers: Sit to/from Stand Sit to Stand: Supervision         General transfer comment: pt declined 2/2 R hip  pain  Ambulation/Gait Ambulation/Gait assistance: Min guard Gait Distance (Feet): 50 Feet Assistive device: Rolling walker (2 wheeled) Gait Pattern/deviations: Step-to pattern;Decreased step length - right;Decreased step length - left;Decreased weight shift to right;Step-through pattern;Narrow base of support Gait velocity: decr   General Gait Details: patient able to ambulate with RW and min guard. Cues for sequencing and use of AD.  Pain limited   Stairs             Wheelchair Mobility    Modified Rankin (Stroke Patients Only)       Balance Overall balance assessment: Needs assistance Sitting-balance support: Feet supported Sitting balance-Leahy Scale: Good     Standing balance support: Bilateral upper extremity supported;During functional activity Standing balance-Leahy Scale: Fair Standing balance comment: reliant on B UE support                            Cognition Arousal/Alertness: Awake/alert;Lethargic Behavior During Therapy: WFL for tasks assessed/performed Overall Cognitive Status: Within Functional Limits for tasks assessed                                 General Comments: Pt appeared groggy, a bit slow to respond/process      Exercises Total Joint Exercises Ankle Circles/Pumps: AROM;Both;10 reps Quad Sets: AROM;Both;10 reps Gluteal Sets: AROM;10 reps;Both Heel Slides: AAROM;Right;10 reps Hip ABduction/ADduction: AROM;Right;10 reps Other Exercises Other Exercises: Pt educated extensively in posterior hip precautions and how to maintain during ADL/mobility with verbal instruction, visual demo, and teach back. Pt will benefit from additional education to support  recall and carryover to maximize safety.    General Comments        Pertinent Vitals/Pain Pain Assessment: 0-10 Pain Score: 7  Pain Location: R hip Pain Descriptors / Indicators: Aching;Discomfort;Sore Pain Intervention(s): Monitored during  session;Repositioned    Home Living Family/patient expects to be discharged to:: Private residence Living Arrangements: Other relatives Available Help at Discharge: Family;Available PRN/intermittently Type of Home: House Home Access: Ramped entrance   Home Layout: One level        Prior Function Level of Independence: Independent;Needs assistance  Gait / Transfers Assistance Needed: was not using AD prior to admission ADL's / Homemaking Assistance Needed: pt indep with basic ADL; sister did most of the driving; gdtr assisted with groceries; pt indep with med mgt Comments: pt endorses this 1 fall in past 64mo   PT Goals (current goals can now be found in the care plan section) Acute Rehab PT Goals Patient Stated Goal: have less pain and go home PT Goal Formulation: With patient Time For Goal Achievement: 03/28/21 Potential to Achieve Goals: Good Progress towards PT goals: Progressing toward goals    Frequency    BID      PT Plan Current plan remains appropriate    Co-evaluation              AM-PAC PT "6 Clicks" Mobility   Outcome Measure  Help needed turning from your back to your side while in a flat bed without using bedrails?: None Help needed moving from lying on your back to sitting on the side of a flat bed without using bedrails?: A Little Help needed moving to and from a bed to a chair (including a wheelchair)?: A Little Help needed standing up from a chair using your arms (e.g., wheelchair or bedside chair)?: A Little Help needed to walk in hospital room?: A Little Help needed climbing 3-5 steps with a railing? : A Little 6 Click Score: 19    End of Session Equipment Utilized During Treatment: Gait belt Activity Tolerance: Patient limited by pain;Patient limited by fatigue Patient left: in bed;with call bell/phone within reach;with bed alarm set;with SCD's reapplied Nurse Communication: Mobility status PT Visit Diagnosis: Other abnormalities of gait  and mobility (R26.89);Muscle weakness (generalized) (M62.81);Pain;Difficulty in walking, not elsewhere classified (R26.2);History of falling (Z91.81) Pain - Right/Left: Right Pain - part of body: Hip     Time: 1440-1505 PT Time Calculation (min) (ACUTE ONLY): 25 min  Charges:  $Gait Training: 8-22 mins $Therapeutic Exercise: 8-22 mins                    [employer], PT, [employer] 03/22/21,3:19 PM

## 2021-03-22 NOTE — Progress Notes (Signed)
Vitals machine wouldn't load pt into, entered manually

## 2021-03-23 LAB — BASIC METABOLIC PANEL
Anion gap: 3 — ABNORMAL LOW (ref 5–15)
BUN: 12 mg/dL (ref 8–23)
CO2: 29 mmol/L (ref 22–32)
Calcium: 9.5 mg/dL (ref 8.9–10.3)
Chloride: 107 mmol/L (ref 98–111)
Creatinine, Ser: 0.71 mg/dL (ref 0.44–1.00)
GFR, Estimated: >60 mL/min (ref >60)
Glucose, Bld: 101 mg/dL — ABNORMAL HIGH (ref 70–99)
Potassium: 4.1 mmol/L (ref 3.5–5.1)
Sodium: 139 mmol/L (ref 135–145)

## 2021-03-23 MED ORDER — ONDANSETRON HCL 4 MG PO TABS: 4.0000 mg | ORAL_TABLET | Freq: Four times a day (QID) | ORAL | 0 refills | Status: DC | PRN

## 2021-03-23 MED ORDER — ENOXAPARIN SODIUM 40 MG/0.4ML IJ SOSY: 40.0000 mg | PREFILLED_SYRINGE | INTRAMUSCULAR | 0 refills | Status: DC

## 2021-03-23 MED ORDER — OXYCODONE HCL 5 MG PO TABS: 5.0000 mg | ORAL_TABLET | ORAL | 0 refills | Status: DC | PRN

## 2021-03-23 MED ORDER — SENNOSIDES-DOCUSATE SODIUM 8.6-50 MG PO TABS: 1.0000 | ORAL_TABLET | Freq: Two times a day (BID) | ORAL | 0 refills | Status: AC

## 2021-03-23 NOTE — Progress Notes (Signed)
Physical Therapy Treatment Patient Details Name: Teresa Lang MRN: [id number] DOB: 22-Feb-1956 Today's Date: 03/23/2021    History of Present Illness Teresa Lang is a 65 y.o. female with medical history significant for COPD, hypertension, depression, chronic pain, rheumatoid arthritis and osteopenia brought in by EMS following a fall onto her right hip with difficulty getting up thereafter.  Patient states she was walking up some stairs tripped and fell onto her right hip with sudden onset sharp pain. S/P R hemiarthroplasty, WBAT    PT Comments    Patient received in recliner, she is agreeable to PT session. Patient reports 9/10 pain this session. RN notified. She requires min guard to supervision for sit to stand. Min guard/supervision for ambulation. Cues to use UEs to un-weight R LE due to pain. Slow, labored cadence.  Patient will continue to benefit from skilled PT while here to improve functional independence and strength.      Follow Up Recommendations  Home health PT;Supervision for mobility/OOB     Equipment Recommendations  None recommended by PT    Recommendations for Other Services       Precautions / Restrictions Precautions Precautions: Posterior Hip;Fall Precaution Booklet Issued: Yes (comment) Precaution Comments: patient requires continued cues for hip precautions Restrictions Weight Bearing Restrictions: Yes RLE Weight Bearing: Weight bearing as tolerated    Mobility  Bed Mobility               General bed mobility comments: patient received in recliner and remained in recliner at end of session    Transfers Overall transfer level: Needs assistance Equipment used: Rolling walker (2 wheeled) Transfers: Sit to/from Stand Sit to Stand: Supervision         General transfer comment: cues for hand placement  Ambulation/Gait Ambulation/Gait assistance: Min guard;Supervision Gait Distance (Feet): 60 Feet Assistive device:  Rolling walker (2 wheeled) Gait Pattern/deviations: Step-to pattern;Decreased step length - right;Decreased step length - left;Narrow base of support;Antalgic;Decreased weight shift to right Gait velocity: decr   General Gait Details: patient able to ambulate with RW and min guard. Cues for sequencing and use of AD.  Pain limited   Stairs             Wheelchair Mobility    Modified Rankin (Stroke Patients Only)       Balance Overall balance assessment: Needs assistance Sitting-balance support: Feet supported Sitting balance-Leahy Scale: Good     Standing balance support: Bilateral upper extremity supported;During functional activity Standing balance-Leahy Scale: Fair Standing balance comment: reliant on B UE support                            Cognition Arousal/Alertness: Awake/alert;Lethargic Behavior During Therapy: WFL for tasks assessed/performed Overall Cognitive Status: Within Functional Limits for tasks assessed                                 General Comments: Pt appeared groggy, a bit slow to respond/process      Exercises Total Joint Exercises Ankle Circles/Pumps: AROM;Both;10 reps Quad Sets: AROM;Both;10 reps Heel Slides: AROM;Right;10 reps Hip ABduction/ADduction: AROM;Right;10 reps Long Arc Quad: AROM;Both;10 reps    General Comments        Pertinent Vitals/Pain Pain Assessment: 0-10 Pain Score: 9  Pain Location: R hip Pain Descriptors / Indicators: Aching;Discomfort;Sore Pain Intervention(s): Monitored during session;Repositioned;Premedicated before session    Home Living  Prior Function            PT Goals (current goals can now be found in the care plan section) Acute Rehab PT Goals Patient Stated Goal: have less pain and go home PT Goal Formulation: With patient Time For Goal Achievement: 03/28/21 Potential to Achieve Goals: Good Progress towards PT goals: Progressing toward  goals    Frequency    BID      PT Plan Current plan remains appropriate    Co-evaluation              AM-PAC PT "6 Clicks" Mobility   Outcome Measure  Help needed turning from your back to your side while in a flat bed without using bedrails?: None Help needed moving from lying on your back to sitting on the side of a flat bed without using bedrails?: A Little Help needed moving to and from a bed to a chair (including a wheelchair)?: A Little Help needed standing up from a chair using your arms (e.g., wheelchair or bedside chair)?: A Little Help needed to walk in hospital room?: A Little Help needed climbing 3-5 steps with a railing? : A Little 6 Click Score: 19    End of Session Equipment Utilized During Treatment: Gait belt Activity Tolerance: Patient limited by pain Patient left: in chair;with call bell/phone within reach;with chair alarm set Nurse Communication: Mobility status PT Visit Diagnosis: Other abnormalities of gait and mobility (R26.89);Muscle weakness (generalized) (M62.81);Pain;Difficulty in walking, not elsewhere classified (R26.2);History of falling (Z91.81) Pain - Right/Left: Right Pain - part of body: Hip     Time: 1020-1043 PT Time Calculation (min) (ACUTE ONLY): 23 min  Charges:  $Gait Training: 8-22 mins $Therapeutic Exercise: 8-22 mins                    Suzzane Quilter, PT, GCS 03/23/21,10:54 AM

## 2021-03-23 NOTE — Care Management Important Message (Signed)
Important Message  Patient Details  Name: Teresa Lang MRN: [id number] Date of Birth: Oct 29, 1955   Medicare Important Message Given:  Yes     Olegario Messier A [name] 03/23/2021, 10:10 AM

## 2021-03-23 NOTE — Discharge Summary (Signed)
Physician Discharge Summary  Teresa Lang ZOX:[id number] DOB: 01-04-1956 DOA: 03/19/2021  PCP: Patrice Paradise, MD  Admit date: 03/19/2021 Discharge date: 03/23/2021  Admitted From: Home Disposition: Home with home health  Recommendations for Outpatient Follow-up:  Follow up with PCP in 1-2 weeks Follow-up orthopedics 2 weeks  Home Health: Yes, home health PT and OT Equipment/Devices: None Discharge Condition: Stable CODE STATUS: Full Diet recommendation: Heart healthy  Brief/Interim Summary: 65 y.o. female with medical history significant for COPD, hypertension, depression, chronic pain, rheumatoid arthritis and osteopenia brought in by EMS following a fall onto her right hip with difficulty getting up thereafter.  Patient states she was walking up some stairs tripped and fell onto her right hip with sudden onset sharp pain.  She denies hitting her head and had no loss of consciousness.  She was previously in her usual state of health.  Denied recent illness, no chest pain, shortness of breath, palpitations or lightheadedness.  Denied visual disturbance or headache, one-sided weakness numbness or tingling prior to the fall.   Seen evaluated by the orthopedic service as well as this writer on the day of discharge.  Patient's pain control has improved.  Still no BM reported however after discussion with the patient she states she has history of inconsistent bowel movements.  Her belly is soft, she is having good bowel sounds, she is passing flatus.  She is tolerating p.o. intake.  We will discharge home at this time with bowel regimen prescribed.  Follow-up PCP 1 week.  Follow-up with orthopedics 14 days. Discharge Diagnoses:  Principal Problem:   Fracture of femoral neck, right (HCC) Active Problems:   Hypertension, essential   Rheumatoid arthritis (HCC)   Chronic pain syndrome   Chronic obstructive pulmonary disease, unspecified (HCC)   Preoperative clearance    Accidental fall   Osteopenia of multiple sites   Depression   Closed right hip fracture (HCC)  Right femoral neck fracture secondary to an accidental fall S/p right hip hemiarthroplasty on 6/12.  POD 3 Endorsing some postoperative pain, most present when trying to ambulate Improved at time of discharge No bowel movement reported, okay with discharge with bowel regimen prescribed Pain medication prescribed by orthopedics SQ Lovenox x14 days for VT prophylaxis prescribed Discharge home with 2-week follow-up with orthopedics     Hypertension, essential - Continue diltiazem and lisinopril     Rheumatoid arthritis (HCC) - Continue methotrexate     Chronic obstructive pulmonary disease, unspecified (HCC) - Resume home regimen     Depression -Continue Cymbalta, BuSpar, lorazepam, Abilify and lithium   Right ankle pain No fracture on x-ray.  Could be minor sprain from fall and injury  Discharge Instructions  Discharge Instructions     Diet - low sodium heart healthy   Complete by: As directed    Increase activity slowly   Complete by: As directed    No wound care   Complete by: As directed       Allergies as of 03/23/2021       Reactions   Prednisone Anxiety        Medication List     STOP taking these medications    solifenacin 5 MG tablet Commonly known as: VESICARE       TAKE these medications    albuterol 108 (90 Base) MCG/ACT inhaler Commonly known as: VENTOLIN HFA Inhale 2 puffs into the lungs every 6 (six) hours as needed for wheezing or shortness of breath.   ARIPiprazole 15 MG  tablet Commonly known as: ABILIFY Take 15 mg by mouth daily.   atorvastatin 20 MG tablet Commonly known as: LIPITOR Take 20 mg by mouth daily.   busPIRone 10 MG tablet Commonly known as: BUSPAR Take 10 mg by mouth 3 (three) times daily.   diltiazem 240 MG 24 hr capsule Commonly known as: CARDIZEM CD Take 240 mg by mouth daily.   DULoxetine 60 MG  capsule Commonly known as: CYMBALTA Take 120 mg by mouth daily.   enoxaparin 40 MG/0.4ML injection Commonly known as: LOVENOX Inject 0.4 mLs (40 mg total) into the skin daily.   folic acid 1 MG tablet Commonly known as: FOLVITE Take 1 tablet (1 mg total) by mouth daily. What changed: when to take this   lisinopril 20 MG tablet Commonly known as: ZESTRIL Take 1 tablet (20 mg total) by mouth daily.   lithium carbonate 300 MG CR tablet Commonly known as: LITHOBID Take 300 mg by mouth at bedtime.   LORazepam 1 MG tablet Commonly known as: ATIVAN Take 1 mg by mouth at bedtime as needed for sleep. Notes to patient: Not given this hospital visit   methotrexate 2.5 MG tablet Commonly known as: RHEUMATREX Take 17.5 mg by mouth every /13/22 1434            Follow-up Information     Anson Oregon, PA-C. Go on 04/06/2021.   Specialty: Physician Assistant Why: Staple removal and x-rays.;  @ 9:15 am Contact information: [address] [place] [address] [phone]         Patrice Paradise, MD. Schedule an appointment as soon as possible for a visit in 1 week(s).   Specialty: Physician Assistant Contact information: [phone] [facility] [address] [facility] [place] [address] [phone]                Allergies  Allergen Reactions   Prednisone Anxiety    Consultations: Orthopedics   Procedures/Studies: DG Chest 1 View  Result Date: 03/19/2021  CLINICAL DATA:  Fall, right hip injury EXAM: CHEST  1 VIEW COMPARISON:  11/15/2018 FINDINGS: Heart and mediastinal contours are within normal limits. No focal opacities or effusions. No acute bony abnormality. IMPRESSION: No active disease. Electronically Signed   By: Charlett Nose M.D.   On: 03/19/2021 01:29   DG Ankle Right Port  Result Date: 03/20/2021 CLINICAL DATA:  Fall yesterday with right ankle pain. EXAM: PORTABLE RIGHT ANKLE - 2 VIEW COMPARISON:  None. FINDINGS: Ankle mortise is normal. No evidence of acute fracture or dislocation. Minimal degenerative change over the tibiotalar joint. IMPRESSION: No acute findings. Electronically Signed   By: Elberta Fortis M.D.   On: 03/20/2021 08:03   ECHOCARDIOGRAM COMPLETE  Result Date: 03/21/2021    ECHOCARDIOGRAM REPORT   Patient Name:   [facility] Jacober Date of Exam: 03/21/2021 Medical Rec #:  [id number]               Height:       65.0 in Accession #:    [id number]              Weight:       158.0 lb Date of Birth:  [birth year]               BSA:          1.790 m Patient Age:    65 years                BP:           132/77 mmHg Patient Gender: F                        HR:           91 bpm. Exam Location:  [facility] Procedure: 2D Echo, Color Doppler and Cardiac Doppler Indications:     R07.9 Chest Pain  History:         Patient has no prior history of Echocardiogram examinations.                  COPD; Risk Factors:Hypertension and Dyslipidemia.  Sonographer:     Humphrey Rolls RDCS (AE) Referring Phys:  [id number] Excell S[facility] Diagnosing Phys: Harold Hedge MD  Sonographer Comments: Suboptimal parasternal window. IMPRESSIONS  1. Left ventricular ejection fraction, by estimation, is 60 to 65%. The left ventricle has normal function. The left ventricle has no regional wall motion abnormalities. Left ventricular diastolic parameters are consistent with Grade I diastolic dysfunction (impaired relaxation).  2. Right ventricular systolic function is normal. The right ventricular size is normal.  3. The mitral valve is grossly normal. No evidence of mitral valve regurgitation.  4. The aortic valve was not well visualized. Aortic valve regurgitation is not visualized. FINDINGS  Left Ventricle: Left ventricular ejection fraction, by estimation, is 60 to 65%. The left ventricle has normal function. The left ventricle has no regional wall motion abnormalities. The left ventricular internal cavity size was normal in size. There is  borderline left ventricular hypertrophy. Left ventricular diastolic parameters are consistent with Grade I diastolic dysfunction (impaired relaxation). Right Ventricle: The right ventricular size is normal. No increase in right ventricular wall thickness. Right ventricular systolic function is normal. Left Atrium: Left atrial size was normal in size. Right Atrium: Right atrial size was normal in size. Pericardium: There is no evidence of pericardial effusion. Mitral Valve: The mitral valve is grossly normal. No evidence of mitral valve regurgitation. MV peak gradient, 4.1  mmHg. The mean mitral valve gradient is 2.0 mmHg. Tricuspid Valve: The tricuspid valve is not well  visualized. Tricuspid valve regurgitation is trivial. Aortic Valve: The aortic valve was not well visualized. Aortic valve regurgitation is not visualized. Aortic valve mean gradient measures 4.0 mmHg. Aortic valve peak gradient measures 7.3 mmHg. Aortic valve area, by VTI measures 3.08 cm. Pulmonic Valve: The pulmonic valve was not well visualized. Pulmonic valve regurgitation is trivial. Aorta: The aortic root is normal in size and structure. IAS/Shunts: The atrial septum is grossly normal.  LEFT VENTRICLE PLAX 2D LVIDd:         4.30 cm  Diastology LVIDs:         3.10 cm  LV e' medial:    6.20 cm/s LV PW:         1.10 cm  LV E/e' medial:  11.5 LV IVS:        0.80 cm  LV e' lateral:   9.25 cm/s LVOT diam:     2.30 cm  LV E/e' lateral: 7.7 LV SV:         71 LV SV Index:   40 LVOT Area:     4.15 cm  LEFT ATRIUM             Index LA diam:        2.60 cm 1.45 cm/m LA Vol (A2C):   22.9 ml 12.80 ml/m LA Vol (A4C):   23.7 ml 13.24 ml/m LA Biplane Vol: 25.5 ml 14.25 ml/m  AORTIC VALVE                   PULMONIC VALVE AV Area (Vmax):    3.60 cm    PV Vmax:       1.15 m/s AV Area (Vmean):   3.02 cm    PV Vmean:      69.000 cm/s AV Area (VTI):     3.08 cm    PV VTI:        0.150 m AV Vmax:           135.00 cm/s PV Peak grad:  5.3 mmHg AV Vmean:          97.400 cm/s PV Mean grad:  2.0 mmHg AV VTI:            0.232 m AV Peak Grad:      7.3 mmHg AV Mean Grad:      4.0 mmHg LVOT Vmax:         117.00 cm/s LVOT Vmean:        70.800 cm/s LVOT VTI:          0.172 m LVOT/AV VTI ratio: 0.74  AORTA Ao Root diam: 3.10 cm MITRAL VALVE MV Area (PHT): 4.86 cm     SHUNTS MV Area VTI:   3.59 cm     Systemic VTI:  0.17 m MV Peak grad:  4.1 mmHg     Systemic Diam: 2.30 cm MV Mean grad:  2.0 mmHg MV Vmax:       1.01 m/s MV Vmean:      58.8 cm/s MV Decel Time: 156 msec MV E velocity: 71.00 cm/s MV A velocity: 108.00 cm/s MV E/A ratio:  0.66 Harold Hedge MD Electronically signed by Harold Hedge MD Signature Date/Time: 03/21/2021/12:38:57  PM    Final    DG HIP UNILAT W OR W/O PELVIS 2-3 VIEWS RIGHT  Result Date: 03/21/2021 CLINICAL DATA:  Status post right hip replacement EXAM: DG HIP (WITH  OR WITHOUT PELVIS) 2-3V RIGHT COMPARISON:  None. FINDINGS: Right hip replacement is now seen in satisfactory position. Mild degenerative changes of the left hip joint are noted. No soft tissue abnormality is seen. IMPRESSION: Status post right hip replacement. Electronically Signed   By: Alcide Clever M.D.   On: 03/21/2021 08:29   DG Hip Unilat W or Wo Pelvis 2-3 Views Right  Result Date: 03/19/2021 CLINICAL DATA:  Fall, right hip injury EXAM: DG HIP (WITH OR WITHOUT PELVIS) 2-3V RIGHT COMPARISON:  None. FINDINGS: There is a right femoral neck fracture with varus angulation. No subluxation or dislocation. Hip joints and SI joints symmetric and unremarkable. IMPRESSION: Right femoral neck fracture with varus angulation. Electronically Signed   By: Charlett Nose M.D.   On: 03/19/2021 01:26   (Echo, Carotid, EGD, Colonoscopy, ERCP)    Subjective: Seen and examined on the day of discharge.  Stable, no distress.  Stable for discharge home.  Discharge Exam: Vitals:   03/23/21 0527 03/23/21 0735  BP:  121/68  Pulse: 78 79  Resp:  18  Temp:  98.6 F (37 C)  SpO2: 92% 96%   Vitals:   03/22/21 1920 03/22/21 2339 03/23/21 0527 03/23/21 0735  BP: 122/65 128/69  121/68  Pulse: 83 83 78 79  Resp: Temp: 99 F (37.2 C) 98.7 F (37.1 C)  98.6 F (37 C)  TempSrc:    Oral  SpO2: 92% 93% 92% 96%  Weight:      Height:        General: Pt is alert, awake, not in acute distress Cardiovascular: RRR, S1/S2 +, no rubs, no gallops Respiratory: CTA bilaterally, no wheezing, no rhonchi Abdominal: Soft, NT, ND, bowel sounds + Extremities: no edema, no cyanosis    The results of significant diagnostics from this hospitalization (including imaging, microbiology, ancillary and laboratory) are listed below for reference.      Microbiology: Recent Results (from the past 240 hour(s))  Resp Panel by RT-PCR (Flu A&B, Covid) Nasopharyngeal Swab     Status: None   Collection Time: 03/19/21  2:30 AM   Specimen: Nasopharyngeal Swab; Nasopharyngeal(NP) swabs in vial transport medium  Result Value Ref Range Status   SARS Coronavirus 2 by RT PCR NEGATIVE NEGATIVE Final    Comment: (NOTE) SARS-CoV-2 target nucleic acids are NOT DETECTED.  The SARS-CoV-2 RNA is generally detectable in upper respiratory specimens during the acute phase of infection. The lowest concentration of SARS-CoV-2 viral copies this assay can detect is 138 copies/mL. A negative result does not preclude SARS-Cov-2 infection and should not be used as the sole basis for treatment or other patient management decisions. A negative result may occur with  improper specimen collection/handling, submission of specimen other than nasopharyngeal swab, presence of viral mutation(s) within the areas targeted by this assay, and inadequate number of viral copies(<138 copies/mL). A negative result must be combined with clinical observations, patient history, and epidemiological information. The expected result is Negative.  Fact Sheet for Patients:  BloggerCourse.com  Fact Sheet for Healthcare Providers:  SeriousBroker.it  This test is no t yet approved or cleared by the [place] FDA and  has been authorized for detection and/or diagnosis of SARS-CoV-2 by FDA under an Emergency Use Authorization (EUA). This EUA will remain  in effect (meaning this test can be used) for the duration of the COVID-19 declaration under Section 564(b)(1) of the Act, 21 U.S.C.section 360bbb-3(b)(1), unless the authorization is terminated  or revoked sooner.  Influenza A by PCR NEGATIVE NEGATIVE Final   Influenza B by PCR NEGATIVE NEGATIVE Final    Comment: (NOTE) The Xpert Xpress SARS-CoV-2/FLU/RSV plus assay is  intended as an aid in the diagnosis of influenza from Nasopharyngeal swab specimens and should not be used as a sole basis for treatment. Nasal washings and aspirates are unacceptable for Xpert Xpress SARS-CoV-2/FLU/RSV testing.  Fact Sheet for Patients: BloggerCourse.com  Fact Sheet for Healthcare Providers: SeriousBroker.it  This test is not yet approved or cleared by the [place] FDA and has been authorized for detection and/or diagnosis of SARS-CoV-2 by FDA under an Emergency Use Authorization (EUA). This EUA will remain in effect (meaning this test can be used) for the duration of the COVID-19 declaration under Section 564(b)(1) of the Act, 21 U.S.C. section 360bbb-3(b)(1), unless the authorization is terminated or revoked.  Performed at [facility], [address] [place], [place] [address]      Labs: BNP (last 3 results) No results for input(s): BNP in the last 8760 hours. Basic Metabolic Panel: Recent Labs  Lab 03/19/21 0130 03/21/21 0534 03/22/21 0356 03/23/21 0453  NA 137 137 137 139  K 3.6 3.8 3.7 4.1  CL 104 105 104 107  CO2 GLUCOSE 103* 108* 106* 101*  BUN CREATININE 0.79 0.63 0.72 0.71  CALCIUM 9.8 9.0 9.1 9.5   Liver Function Tests: Recent Labs  Lab 03/19/21 0130  AST 28  ALT 22  ALKPHOS 91  BILITOT 0.9  PROT 6.9  ALBUMIN 3.8   No results for input(s): LIPASE, AMYLASE in the last 168 hours. No results for input(s): AMMONIA in the last 168 hours. CBC: Recent Labs  Lab 03/19/21 0130 03/21/21 0534 03/22/21 0356  WBC 12.2* 7.9 8.7  NEUTROABS 9.5*  --   --   HGB 14.0 11.4* 10.4*  HCT 42.4 33.8* 30.7*  MCV 94.6 95.5 93.9  PLT 268 131* 159   Cardiac Enzymes: No results for input(s): CKTOTAL, CKMB, CKMBINDEX, TROPONINI in the last 168 hours. BNP: Invalid input(s): POCBNP CBG: No results for input(s): GLUCAP in the last 168 hours. D-Dimer No  results for input(s): DDIMER in the last 72 hours. Hgb A1c No results for input(s): HGBA1C in the last 72 hours. Lipid Profile No results for input(s): CHOL, HDL, LDLCALC, TRIG, CHOLHDL, LDLDIRECT in the last 72 hours. Thyroid function studies No results for input(s): TSH, T4TOTAL, T3FREE, THYROIDAB in the last 72 hours.  Invalid input(s): FREET3 Anemia work up No results for input(s): VITAMINB12, FOLATE, FERRITIN, TIBC, IRON, RETICCTPCT in the last 72 hours. Urinalysis    Component Value Date/Time   COLORURINE AMBER (A) 11/20/2019 0804   APPEARANCEUR Cloudy (A) 09/06/2020 0758   LABSPEC 1.016 11/20/2019 0804   PHURINE 7.0 11/20/2019 0804   GLUCOSEU Negative 09/06/2020 0758   HGBUR NEGATIVE 11/20/2019 0804   BILIRUBINUR Negative 09/06/2020 0758   KETONESUR NEGATIVE 11/20/2019 0804   PROTEINUR Negative 09/06/2020 0758   PROTEINUR 30 (A) 11/20/2019 0804   NITRITE Negative 09/06/2020 0758   NITRITE NEGATIVE 11/20/2019 0804   LEUKOCYTESUR 1+ (A) 09/06/2020 0758   LEUKOCYTESUR NEGATIVE 11/20/2019 0804   Sepsis Labs Invalid input(s): PROCALCITONIN,  WBC,  LACTICIDVEN Microbiology Recent Results (from the past 240 hour(s))  Resp Panel by RT-PCR (Flu A&B, Covid) Nasopharyngeal Swab     Status: None   Collection Time: 03/19/21  2:30 AM   Specimen: Nasopharyngeal Swab; Nasopharyngeal(NP) swabs in vial transport medium  Result  Value Ref Range Status   SARS Coronavirus 2 by RT PCR NEGATIVE NEGATIVE Final    Comment: (NOTE) SARS-CoV-2 target nucleic acids are NOT DETECTED.  The SARS-CoV-2 RNA is generally detectable in upper respiratory specimens during the acute phase of infection. The lowest concentration of SARS-CoV-2 viral copies this assay can detect is 138 copies/mL. A negative result does not preclude SARS-Cov-2 infection and should not be used as the sole basis for treatment or other patient management decisions. A negative result may occur with  improper specimen  collection/handling, submission of specimen other than nasopharyngeal swab, presence of viral mutation(s) within the areas targeted by this assay, and inadequate number of viral copies(<138 copies/mL). A negative result must be combined with clinical observations, patient history, and epidemiological information. The expected result is Negative.  Fact Sheet for Patients:  BloggerCourse.com  Fact Sheet for Healthcare Providers:  SeriousBroker.it  This test is no t yet approved or cleared by the [place] FDA and  has been authorized for detection and/or diagnosis of SARS-CoV-2 by FDA under an Emergency Use Authorization (EUA). This EUA will remain  in effect (meaning this test can be used) for the duration of the COVID-19 declaration under Section 564(b)(1) of the Act, 21 U.S.C.section 360bbb-3(b)(1), unless the authorization is terminated  or revoked sooner.       Influenza A by PCR NEGATIVE NEGATIVE Final   Influenza B by PCR NEGATIVE NEGATIVE Final    Comment: (NOTE) The Xpert Xpress SARS-CoV-2/FLU/RSV plus assay is intended as an aid in the diagnosis of influenza from Nasopharyngeal swab specimens and should not be used as a sole basis for treatment. Nasal washings and aspirates are unacceptable for Xpert Xpress SARS-CoV-2/FLU/RSV testing.  Fact Sheet for Patients: BloggerCourse.com  Fact Sheet for Healthcare Providers: SeriousBroker.it  This test is not yet approved or cleared by the [place] FDA and has been authorized for detection and/or diagnosis of SARS-CoV-2 by FDA under an Emergency Use Authorization (EUA). This EUA will remain in effect (meaning this test can be used) for the duration of the COVID-19 declaration under Section 564(b)(1) of the Act, 21 U.S.C. section 360bbb-3(b)(1), unless the authorization is terminated or revoked.  Performed at [facility], [address] [place], [place] [address]      Time coordinating discharge: Over 30 minutes  SIGNED:   Tresa Moore, MD  [facility] 03/23/2021, 10:35 AM Pager   If 7PM-7AM, please contact night-coverage

## 2021-03-23 NOTE — Discharge Instructions (Signed)
Instructions after Hip Replacement     J. Jeffrey Poggi, M.D.  J. Lance Jeralyn Nolden, PA-C     Dept. of Orthopaedics & Sports Medicine  [facility]  [address]  , [place]  [address]  Phone: [phone]   Fax: [phone]    DIET: Drink plenty of non-alcoholic fluids. Resume your normal diet. Include foods high in fiber.  ACTIVITY:  You may use crutches or a walker with weight-bearing as tolerated, unless instructed otherwise. You may be weaned off of the walker or crutches by your Physical Therapist.  Do NOT reach below the level of your knees or cross your legs until allowed.    Continue doing gentle exercises. Exercising will reduce the pain and swelling, increase motion, and prevent muscle weakness.   Please continue to use the TED compression stockings for 6 weeks. You may remove the stockings at night, but should reapply them in the morning. Do not drive or operate any equipment until instructed.  WOUND CARE:  Continue to use ice packs periodically to reduce pain and swelling. Keep the incision clean and dry. You may bathe or shower after the staples are removed at the first office visit following surgery.  MEDICATIONS: You may resume your regular medications. Please take the pain medication as prescribed on the medication. Do not take pain medication on an empty stomach. You have been given a prescription for a blood thinner to prevent blood clots. Please take the medication as instructed. (NOTE: After completing a 2 week course of Lovenox, take one Enteric-coated aspirin once a day.) Pain medications and iron supplements can cause constipation. Use a stool softener (Senokot or Colace) on a daily basis and a laxative (dulcolax or miralax) as needed. Do not drive or drink alcoholic beverages when taking pain medications.  CALL THE OFFICE FOR: Temperature above 101 degrees Excessive bleeding or drainage on the dressing. Excessive swelling, coldness, or  paleness of the toes. Persistent nausea and vomiting.  FOLLOW-UP:  You should have an appointment to return to the office in 2 weeks after surgery. Arrangements have been made for continuation of Physical Therapy (either home therapy or outpatient therapy).  

## 2021-03-23 NOTE — Progress Notes (Signed)
  Subjective: 3 Days Post-Op Procedure(s) (LRB): ARTHROPLASTY BIPOLAR HIP (HEMIARTHROPLASTY) (Right) Patient reports pain as moderate.   Patient is well, and has had no acute complaints or problems Current plan is for d/c home with HHPT. Negative for chest pain and shortness of breath Fever: no Gastrointestinal:Negative for nausea and vomiting  Objective: Vital signs in last 24 hours: Temp:  [98.6 F (37 C)-99.1 F (37.3 C)] 98.6 F (37 C) (06/15 0735) Pulse Rate:  [78-92] 79 (06/15 0735) Resp:  [15-18] 18 (06/15 0735) BP: (107-132)/(61-69) 121/68 (06/15 0735) SpO2:  [91 %-96 %] 96 % (06/15 0735)  Intake/Output from previous day: No intake or output data in the 24 hours ending 03/23/21 0750   Intake/Output this shift: No intake/output data recorded.  Labs: Recent Labs    03/21/21 0534 03/22/21 0356  HGB 11.4* 10.4*   Recent Labs    03/21/21 0534 03/22/21 0356  WBC 7.9 8.7  RBC 3.54* 3.27*  HCT 33.8* 30.7*  PLT 131* 159   Recent Labs    03/22/21 0356 03/23/21 0453  NA 137 139  K 3.7 4.1  CL 104 107  CO2 28 29  BUN 14 12  CREATININE 0.72 0.71  GLUCOSE 106* 101*  CALCIUM 9.1 9.5   No results for input(s): LABPT, INR in the last 72 hours.   EXAM General - Patient is Alert, Appropriate, and Oriented Extremity - ABD soft Neurovascular intact Sensation intact distally Intact pulses distally Dorsiflexion/Plantar flexion intact Incision: dressing C/D/I No cellulitis present Dressing/Incision - clean, dry, no drainage Motor Function - intact, moving foot and toes well on exam.  Abdomen soft with intact bowel sounds.  Past Medical History:  Diagnosis Date   Allergy    seasonal   Anxiety    Arthritis    Asthma    COPD (chronic obstructive pulmonary disease) (HCC)    Depression    Dyspnea    GERD (gastroesophageal reflux disease)    Hyperlipidemia    Hypertension     Assessment/Plan: 3 Days Post-Op Procedure(s) (LRB): ARTHROPLASTY BIPOLAR  HIP (HEMIARTHROPLASTY) (Right) Principal Problem:   Fracture of femoral neck, right (HCC) Active Problems:   Hypertension, essential   Rheumatoid arthritis (HCC)   Chronic pain syndrome   Chronic obstructive pulmonary disease, unspecified (HCC)   Preoperative clearance   Accidental fall   Osteopenia of multiple sites   Depression   Closed right hip fracture (HCC)  Estimated body mass index is 26.29 kg/m as calculated from the following:   Height as of this encounter: 5\' 5"  (1.651 m).   Weight as of this encounter: 71.7 kg. Advance diet Up with therapy D/C IV fluids when tolerating po intake.  Vitals stable this AM. Patient is passing gas, states she is not regular.  She denies any abdominal pain or issues.  Ok with monitoring and continuing stool softeners upon discharge home. Continue with PT today.   Plan for possible d/c home today.  Continue Lovenox 40mg  daily for 14 days when discharged. Follow-up in 14 days with [facility] orthopaedics for x-rays of the right hip and staple removal.  DVT Prophylaxis - Lovenox, Foot Pumps, and SCDs Weight-Bearing as tolerated to right leg  J. , PA-C [facility] Orth[facility]edic [facility] 03/23/2021, 7:50 AM

## 2021-04-28 DIAGNOSIS — R0600 Dyspnea, unspecified: Secondary | ICD-10-CM | POA: Insufficient documentation

## 2021-04-28 DIAGNOSIS — R634 Abnormal weight loss: Secondary | ICD-10-CM | POA: Insufficient documentation

## 2021-05-04 ENCOUNTER — Other Ambulatory Visit: Payer: Self-pay | Admitting: Neurology

## 2021-05-04 DIAGNOSIS — M5481 Occipital neuralgia: Secondary | ICD-10-CM

## 2021-05-09 ENCOUNTER — Other Ambulatory Visit: Payer: Self-pay

## 2021-05-09 ENCOUNTER — Encounter: Payer: Self-pay | Admitting: Urology

## 2021-05-09 ENCOUNTER — Ambulatory Visit: Payer: Medicare Other | Admitting: Urology

## 2021-05-09 VITALS — BP 152/85 | HR 92 | Ht 65.0 in | Wt 145.0 lb

## 2021-05-09 DIAGNOSIS — N3946 Mixed incontinence: Secondary | ICD-10-CM

## 2021-05-09 MED ORDER — OXYBUTYNIN CHLORIDE ER 10 MG PO TB24: 10.0000 mg | ORAL_TABLET | Freq: Every day | ORAL | 11 refills | Status: DC

## 2021-05-09 MED ORDER — SOLIFENACIN SUCCINATE 5 MG PO TABS: 5.0000 mg | ORAL_TABLET | Freq: Every day | ORAL | 11 refills | Status: DC

## 2021-05-09 NOTE — Progress Notes (Signed)
05/09/2021 8:25 AM   Teresa Lang 22-Feb-1956 [id number]  Referring provider: Patrice Paradise, MD [address] [facility] [place],  [place] [address]  Chief Complaint  Patient presents with   Urinary Incontinence    HPI: I was consulted to assess the patient is urinary incontinence.  She does not leak with coughing sneezing bending or lifting.  She has urge incontinence.  I think she is small volume bedwetting.  She wears 5 pads a day moderately wet or soaked.   She gets up 4-5 times at night.  She can hold urination for many hours and some days she does not void at all.  She does feel like she needs to urinate first thing in the morning   For 2 months her urine is dark with an odor.   She has had 5 neck operations from previous accident and a hysterectomy is prone to constipation.  Has a smoking history   Patient has urge incontinence.  She voids very infrequently.  She has significant nocturia.  Her urine looked infected today.  She had microscopic hematuria.  The patient is a bit vague and spoke about dark urine.  Hopefully she just has a bladder infection but I I will do a hematuria work-up.     Urine culture positive.  CT scan negative cystoscopy normal   Patient said dark urine went away but she still has urgency incontinence   Reassess in 6 weeks on Myrbetriq 50 mg samples and prescription.  Call if urine culture is positive.  If urine culture positive consider prophylaxis.   Patient switched to oxybutynin 10 mg. Patient 50% improved.  Less frequency.  Less urge incontinence.  Still wearing 3-4 pads a day with variable amount of leakage.  Clinically not infected   On urodynamics she voided less than 20 mL and was catheterized for 300 mL.  Patient's maximum bladder capacity was 513 mL.  Bladder was unstable reaching a pressure of 7 cm of water.  She did not feel the low pressure contraction.  She then had an unstable bladder contraction 26 cm  of water and leaked with urgency office contraction.  She had no stress incontinence with a Valsalva pressure 149 cm of water.  During voluntary voiding she voided 491 mL.  Maximum flow was 26 mils per second.  Maximum voiding pressure 20 cm of water.  Residual was 30 mL.  EMG activity was noted during voiding.  Bladder neck descended 2 cm.  she had a normal flow curve during the voluntary void she had mild bladder trabeculation.     Patient primarily has an overactive bladder.  Like to try 1 more medication.  If this fails I will discussed 3 refractory therapies with her.  She continues to be moderately improved on the oxybutynin so may combine this with Vesicare 5 mg.  Discussed 3 refractory therapies next visit depending on how she does.  If she does really well I can always stop the oxybutynin   Today Frequency stable.  Dramatic improvement in continence.  She was smiling because she is so happy.  No infections.  Tolerating both medications well  Today When patient stopped her Vesicare and she started leaking again.  Both medications together worked Agricultural consultant.  Frequency stable.  Clinically not infected   PMH: Past Medical History:  Diagnosis Date   Allergy    seasonal   Anxiety    Arthritis    Asthma    COPD (chronic obstructive pulmonary  disease) (HCC)    Depression    Dyspnea    GERD (gastroesophageal reflux disease)    Hyperlipidemia    Hypertension     Surgical History: Past Surgical History:  Procedure Laterality Date   ABDOMINAL HYSTERECTOMY  1983   CERVICAL DISC SURGERY     CHOLECYSTECTOMY  1983   HIP ARTHROPLASTY Right 03/20/2021   Procedure: ARTHROPLASTY BIPOLAR HIP (HEMIARTHROPLASTY);  Surgeon: Christena Flake, MD;  Location: [facility] ORS;  Service: Orthopedics;  Laterality: Right;   LUMBAR LAMINECTOMY/DECOMPRESSION MICRODISCECTOMY Right 11/24/2019   Procedure: OPEN L3/4 LAMINECTOMY WITH CYST REMOVAL;  Surgeon: Lucy Chris, MD;  Location: [facility] ORS;  Service:  Neurosurgery;  Laterality: Right;   SPINE SURGERY  beginning 1994   laminectomies, fusions    Home Medications:  Allergies as of 05/09/2021       Reactions   Prednisone Anxiety        Medication List        Accurate as of May 09, 2021  8:25 AM. If you have any questions, ask your nurse or doctor.          albuterol 108 (90 Base) MCG/ACT inhaler Commonly known as: VENTOLIN HFA Inhale 2 puffs into the lungs every 6 (six) hours as needed for wheezing or shortness of breath.   ARIPiprazole 15 MG tablet Commonly known as: ABILIFY Take 15 mg by mouth daily.   atorvastatin 20 MG tablet Commonly known as: LIPITOR Take 20 mg by mouth daily.   busPIRone 10 MG tablet Commonly known as: BUSPAR Take 10 mg by mouth 3 (three) times daily.   diltiazem 240 MG 24 hr capsule Commonly known as: CARDIZEM CD Take 240 mg by mouth daily.   DULoxetine 60 MG capsule Commonly known as: CYMBALTA Take 120 mg by mouth daily.   enoxaparin 40 MG/0.4ML injection Commonly known as: LOVENOX Inject 0.4 mLs (40 mg total) into the skin daily.   folic acid 1 MG tablet Commonly known as: FOLVITE Take 1 tablet (1 mg total) by mouth daily.   lisinopril 20 MG tablet Commonly known as: ZESTRIL Take 1 tablet (20 mg total) by mouth daily.   lithium carbonate 300 MG CR tablet Commonly known as: LITHOBID Take 300 mg by mouth at bedtime.   LORazepam 1 MG tablet Commonly known as: ATIVAN Take 1 mg by mouth at bedtime as needed for sleep.   methotrexate 2.5 MG tablet Commonly known as: RHEUMATREX Take 17.5 mg by mouth every Sunday.   multivitamin tablet Take 1 tablet by mouth daily.   omeprazole 40 MG capsule Commonly known as: PRILOSEC Take 40 mg by mouth daily as needed (acid reflux symptoms).   ondansetron 4 MG tablet Commonly known as: ZOFRAN Take 1 tablet (4 mg total) by mouth every 6 (six) hours as needed for nausea.   oxybutynin 10 MG 24 hr tablet Commonly known as:  DITROPAN-XL Take 10 mg by mouth at bedtime.   oxyCODONE 5 MG immediate release tablet Commonly known as: Oxy IR/ROXICODONE Take 1 tablet (5 mg total) by mouth every 4 (four) hours as needed for moderate pain.   potassium chloride 10 MEQ tablet Commonly known as: KLOR-CON Take 10 mEq by mouth daily.   solifenacin 5 MG tablet Commonly known as: VESICARE Take by mouth.   Trelegy Ellipta 100-62.5-25 MCG/INH Aepb Generic drug: Fluticasone-Umeclidin-Vilant Inhale 2 puffs into the lungs daily.        Allergies:  Allergies  Allergen Reactions   Prednisone Anxiety    Family History:  Family History  Problem Relation Age of Onset   Celiac disease Sister    Breast cancer Neg Hx     Social History:  reports that she has quit smoking. Her smoking use included cigarettes. She started smoking about 60 years ago. She has a 14.25 pack-year smoking history. She has never used smokeless tobacco. She reports previous drug use. Drug: Marijuana. She reports that she does not drink alcohol.  ROS:                                        Physical Exam: BP (!) 152/85   Pulse 92   Ht 5\' 5"  (1.651 m)   Wt 65.8 kg   BMI 24.13 kg/m   Constitutional:  Alert and oriented, No acute distress. HEENT: [place] AT, moist mucus membranes.  Trachea midline, no masses.   Laboratory Data: Lab Results  Component Value Date   WBC 8.7 03/22/2021   HGB 10.4 (L) 03/22/2021   HCT 30.7 (L) 03/22/2021   MCV 93.9 03/22/2021   PLT 159 03/22/2021    Lab Results  Component Value Date   CREATININE 0.71 03/23/2021    No results found for: PSA  No results found for: TESTOSTERONE  Lab Results  Component Value Date   HGBA1C 5.5 03/21/2019    Urinalysis    Component Value Date/Time   COLORURINE AMBER (A) 11/20/2019 0804   APPEARANCEUR Cloudy (A) 09/06/2020 0758   LABSPEC 1.016 11/20/2019 0804   PHURINE 7.0 11/20/2019 0804   GLUCOSEU Negative 09/06/2020 0758   HGBUR NEGATIVE  11/20/2019 0804   BILIRUBINUR Negative 09/06/2020 0758   KETONESUR NEGATIVE 11/20/2019 0804   PROTEINUR Negative 09/06/2020 0758   PROTEINUR 30 (A) 11/20/2019 0804   NITRITE Negative 09/06/2020 0758   NITRITE NEGATIVE 11/20/2019 0804   LEUKOCYTESUR 1+ (A) 09/06/2020 0758   LEUKOCYTESUR NEGATIVE 11/20/2019 0804    Pertinent Imaging:   Assessment & Plan: Renewed Vesicare and oxybutynin 90x3 and I will see in a year  There are no diagnoses linked to this encounter.  No follow-ups on file.  01/18/2020, MD  [facility] [address], [address] [place], [place] [phone]

## 2021-05-17 ENCOUNTER — Ambulatory Visit
Admission: RE | Admit: 2021-05-17 | Discharge: 2021-05-17 | Disposition: A | Discharge status: Home / Self Care | Payer: Medicare Other | Source: Ambulatory Visit | Attending: Neurology | Admitting: Neurology

## 2021-05-17 ENCOUNTER — Other Ambulatory Visit: Payer: Self-pay | Admitting: Neurology

## 2021-05-17 ENCOUNTER — Other Ambulatory Visit: Payer: Self-pay

## 2021-05-17 DIAGNOSIS — M5481 Occipital neuralgia: Secondary | ICD-10-CM | POA: Insufficient documentation

## 2021-05-17 IMAGING — MR MR HEAD W/O CM
12 series · 48 of 48 positions shown · non-contrast
Comparison: Prior head CT examinations [DATE] and earlier.

CLINICAL DATA: Bilateral occipital neuralgia [UW] ([UW]-CM).
Additional history provided: Patient reports headaches, worsening,
pain from neck up sides of head.

EXAM:
MRI HEAD WITHOUT CONTRAST
TECHNIQUE: Multiplanar, multiecho pulse sequences of the brain and surrounding
structures were obtained without intravenous contrast.

[Series 5: ax dwi_tracew · axial · 3.0mm · 0.65mm/px · z∈[-57,+95]mm · 4 of 47 slices shown]
[im 1/47]
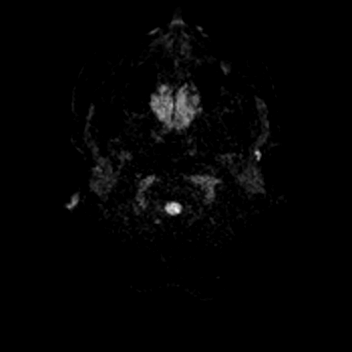
[im 16/47]
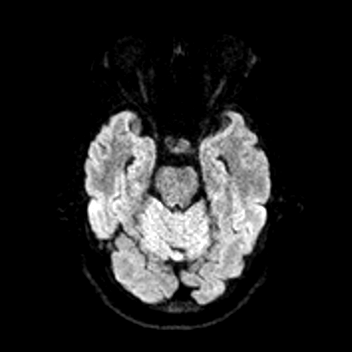
[im 31/47]
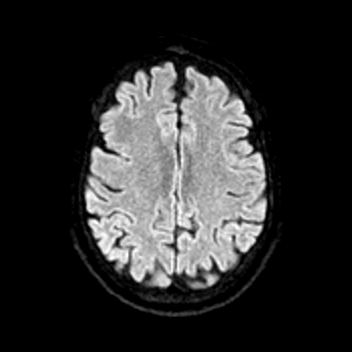
[im 47/47]
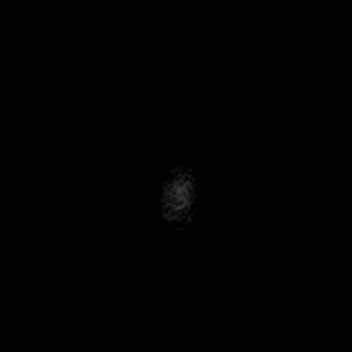

[Series 6: ax dwi_adc · axial · 3.0mm · 0.65mm/px · z∈[-57,+95]mm · 3 of 47 slices shown]
[im 1/47]
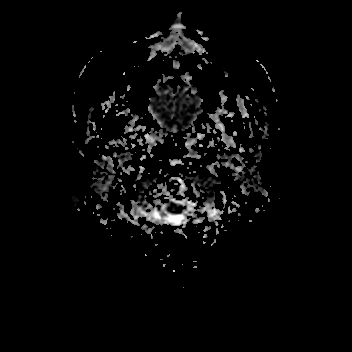
[im 24/47]
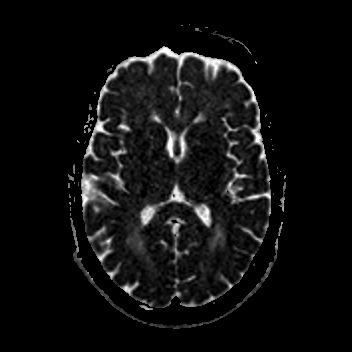
[im 47/47]
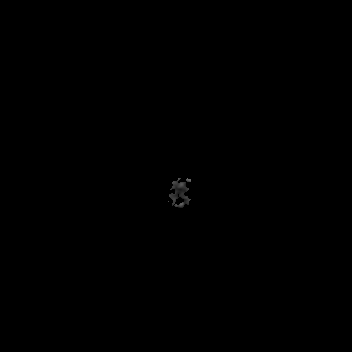

[Series 7: cor dwi_tracew · coronal · 5.0mm · 1.80mm/px · 3 of 38 slices shown]
[im 1/38]
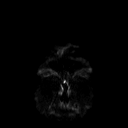
[im 19/38]
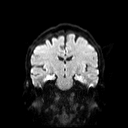
[im 38/38]
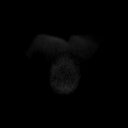

[Series 8: cor dwi_adc · coronal · 5.0mm · 1.80mm/px · 3 of 38 slices shown]
[im 1/38]
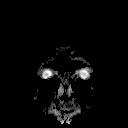
[im 19/38]
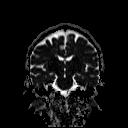
[im 38/38]
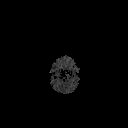

[Series 9: T1 · sagittal · 5.0mm · 0.94mm/px · 2 of 23 slices shown (1 of 2)]
[im 1/23]
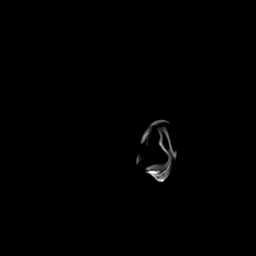
[im 23/23]
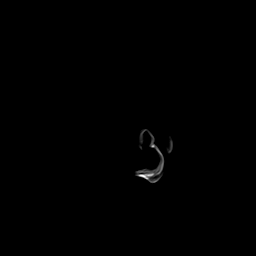

[Series 10: T2 · axial · 5.0mm · 0.53mm/px · z∈[-51,+93]mm · 2 of 25 slices shown (1 of 2)]
[im 1/25]
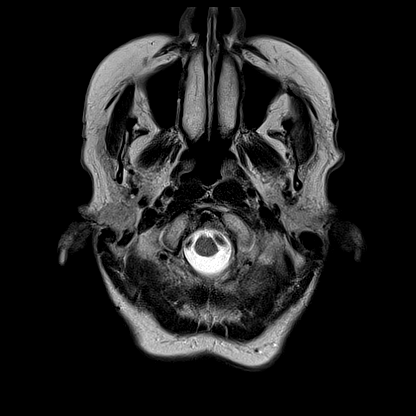
[im 25/25]
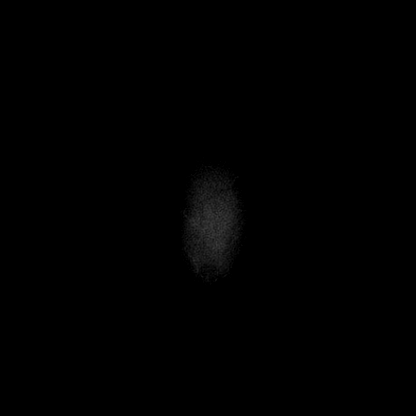

[Series 11: mag_images · axial · 3.0mm · 0.90mm/px · z∈[-68,+109]mm · 4 of 60 slices shown]
[im 1/60]
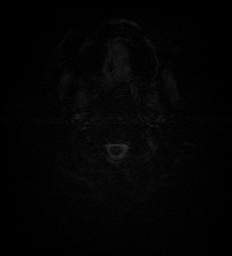
[im 20/60]
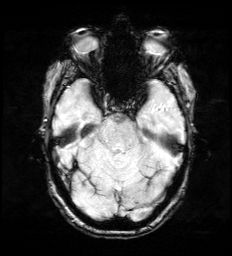
[im 40/60]
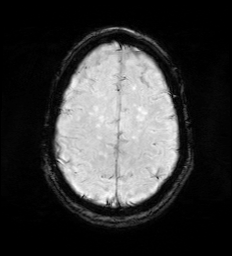
[im 60/60]
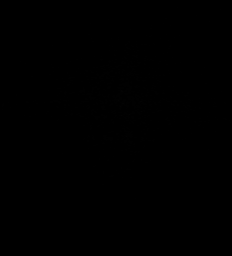

[Series 12: pha_images · axial · 3.0mm · 0.90mm/px · z∈[-68,+100]mm · 4 of 57 slices shown]
[im 1/57]
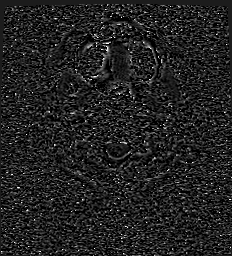
[im 19/57]
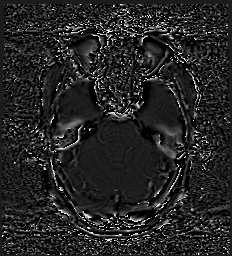
[im 38/57]
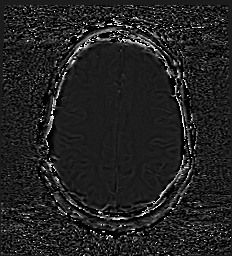
[im 57/57]
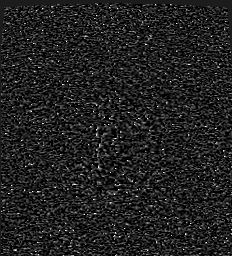

[Series 13: swi_images · axial · 3.0mm · 0.90mm/px · z∈[-68,+109]mm · 4 of 60 slices shown]
[im 1/60]
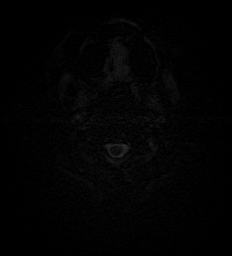
[im 20/60]
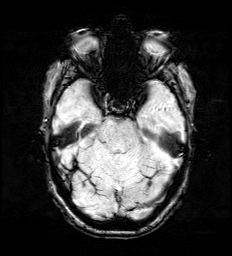
[im 40/60]
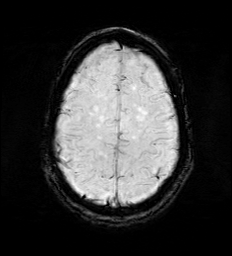
[im 60/60]
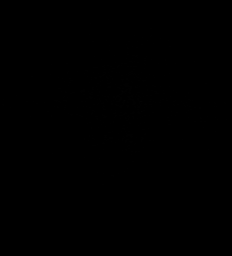

[Series 15: FLAIR · axial · 3.0mm · 0.53mm/px · z∈[-60,+102]mm · 4 of 55 slices shown]
[im 1/55]
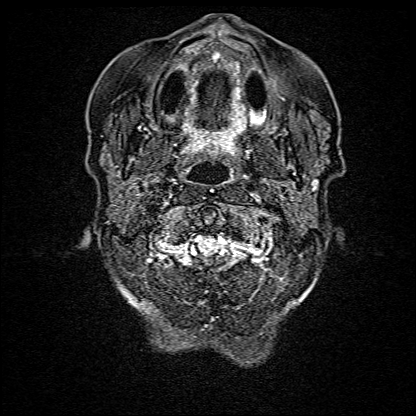
[im 19/55]
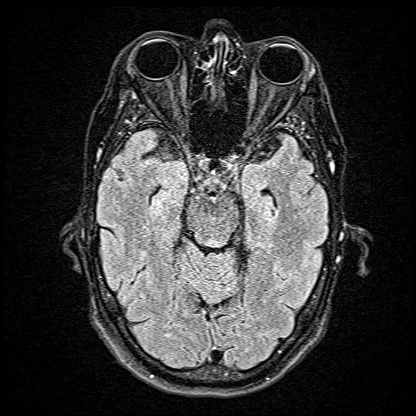
[im 37/55]
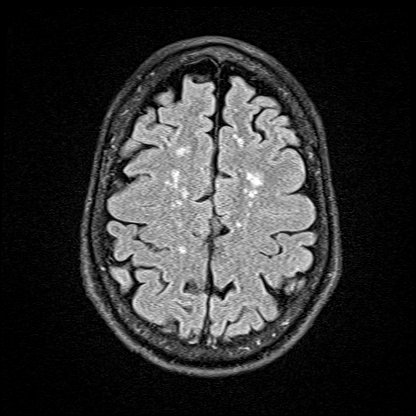
[im 55/55]
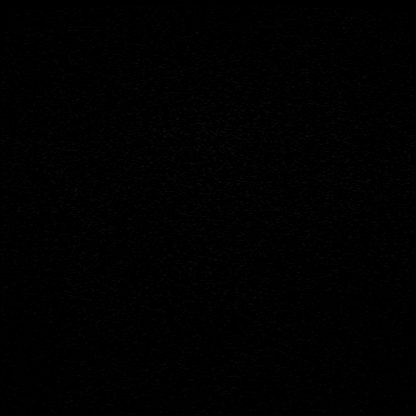

[Series 16: T1 · axial · 1.0mm · 0.98mm/px · z∈[-67,+108]mm · 13 of 173 slices shown (2 of 2)]
[im 1/173]
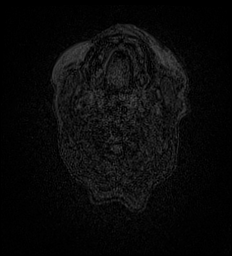
[im 15/173]
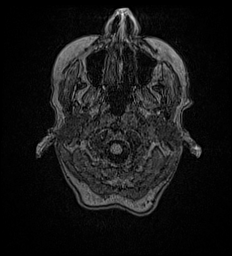
[im 29/173]
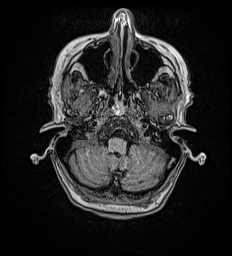
[im 44/173]
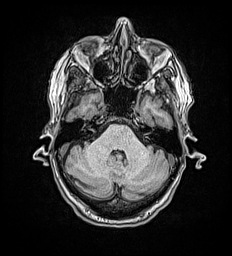
[im 58/173]
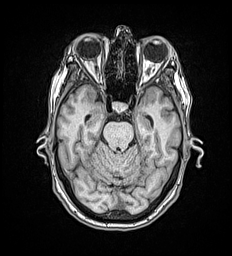
[im 72/173]
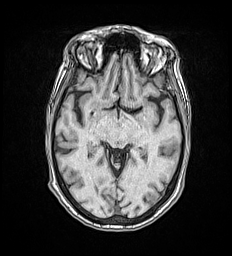
[im 87/173]
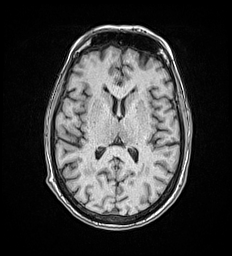
[im 101/173]
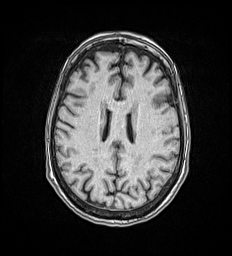
[im 115/173]
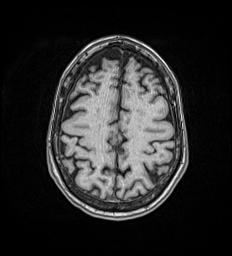
[im 130/173]
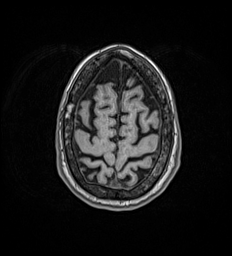
[im 144/173]
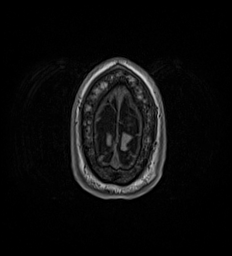
[im 158/173]
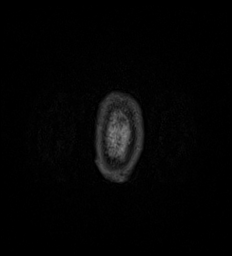
[im 173/173]
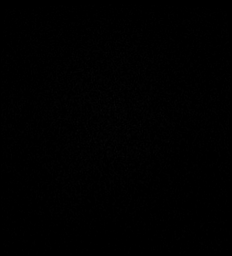

[Series 17: T2 · coronal · 5.0mm · 0.45mm/px · 2 of 31 slices shown (2 of 2)]
[im 1/31]
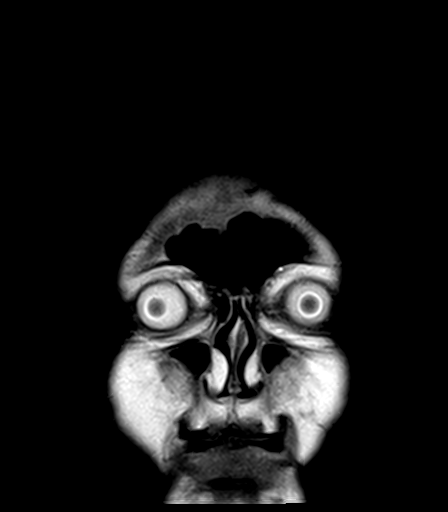
[im 31/31]
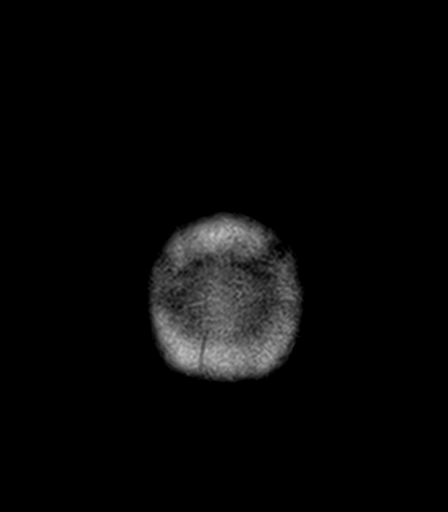

[48 of 48 positions shown; findings below may reference images not displayed]

FINDINGS: Brain:

Cerebral volume is normal for age.

Mild to moderate multifocal T2/FLAIR hyperintensity within the
cerebral white matter, nonspecific but most often secondary to
chronic small vessel ischemia.

There is no acute infarct.

No evidence of an intracranial mass.

No chronic intracranial blood products.

No extra-axial fluid collection.

No midline shift.

Vascular: Maintained proximal large arterial flow voids.

Skull and upper cervical spine: No focal suspicious marrow lesion.
Susceptibility artifact arising from fusion hardware within the
cervical spine.

Sinuses/Orbits: Visualized orbits show no acute finding. Trace
bilateral ethmoid sinus mucosal thickening.
IMPRESSION: No evidence of acute intracranial abnormality.

Mild-to-moderate multifocal T2/FLAIR hyperintense signal changes
within the cerebral white matter, nonspecific but most often
secondary to chronic small vessel ischemia.

Otherwise unremarkable noncontrast MRI appearance of the brain for
age.

Minimal bilateral ethmoid sinus mucosal thickening.

## 2021-05-19 ENCOUNTER — Other Ambulatory Visit: Payer: Self-pay | Admitting: Physician Assistant

## 2021-05-19 DIAGNOSIS — Z1231 Encounter for screening mammogram for malignant neoplasm of breast: Secondary | ICD-10-CM

## 2021-06-02 ENCOUNTER — Other Ambulatory Visit: Payer: Self-pay

## 2021-06-02 ENCOUNTER — Ambulatory Visit
Admission: RE | Admit: 2021-06-02 | Discharge: 2021-06-02 | Disposition: A | Discharge status: Home / Self Care | Payer: Medicare Other | Source: Ambulatory Visit | Attending: Physician Assistant | Admitting: Physician Assistant

## 2021-06-02 DIAGNOSIS — Z1231 Encounter for screening mammogram for malignant neoplasm of breast: Secondary | ICD-10-CM | POA: Insufficient documentation

## 2021-06-02 IMAGING — MG MM DIGITAL SCREENING BILAT W/ TOMO AND CAD
6 of 10 series · 6 of 30 positions shown · non-contrast
Comparison: Previous exam(s).

CLINICAL DATA: Screening.

EXAM:
DIGITAL SCREENING BILATERAL MAMMOGRAM WITH TOMOSYNTHESIS AND CAD
TECHNIQUE: Bilateral screening digital craniocaudal and mediolateral oblique
mammograms were obtained. Bilateral screening digital breast
tomosynthesis was performed. The images were evaluated with
computer-aided detection.

[R CC synth-2D (1 of 2)]
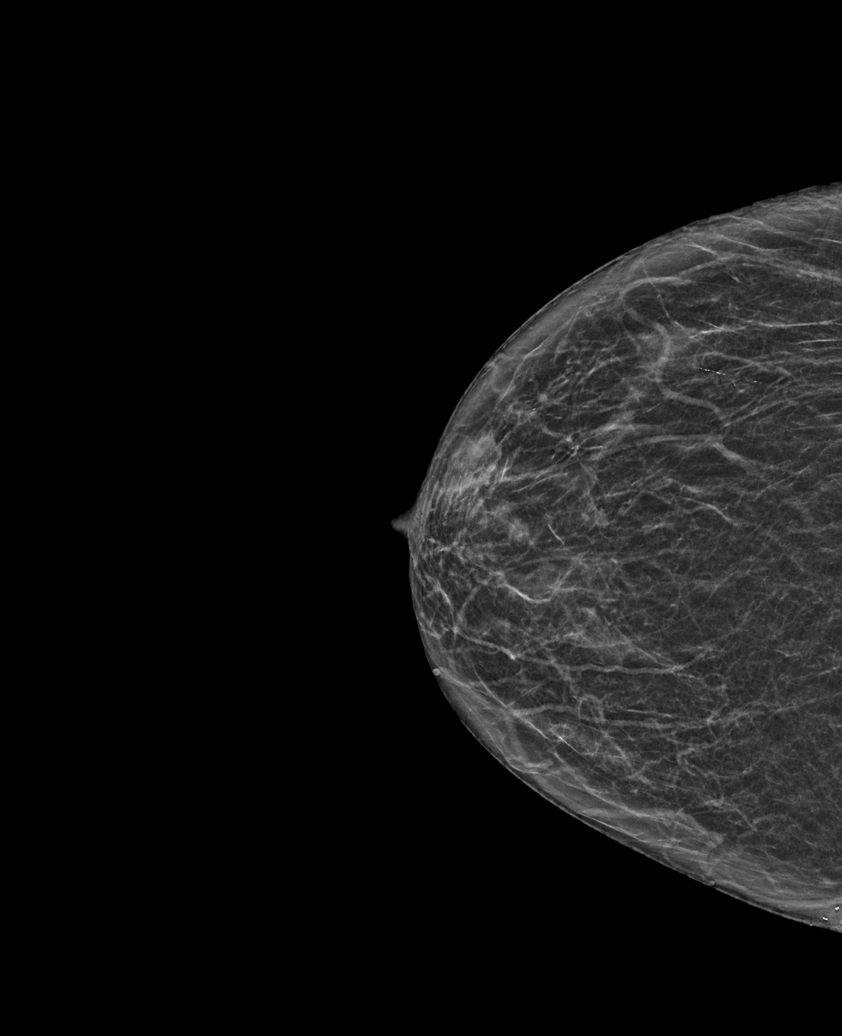

[R CC synth-2D (2 of 2)]
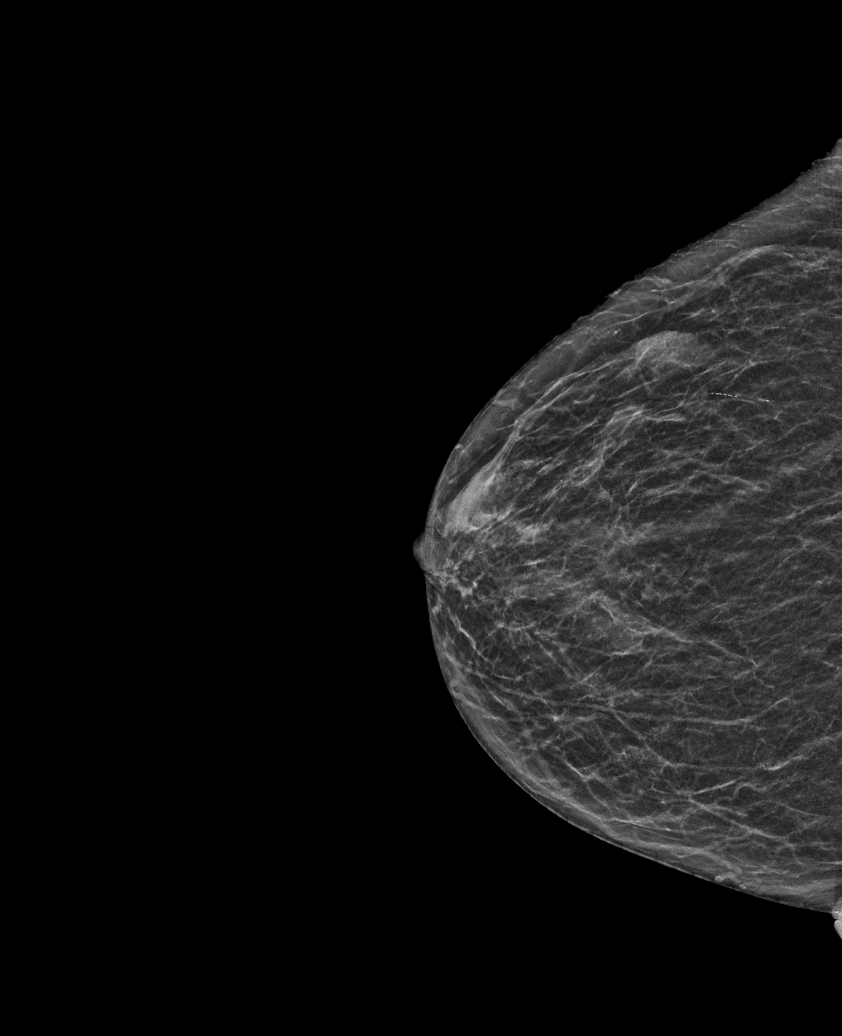

[R MLO synth-2D]
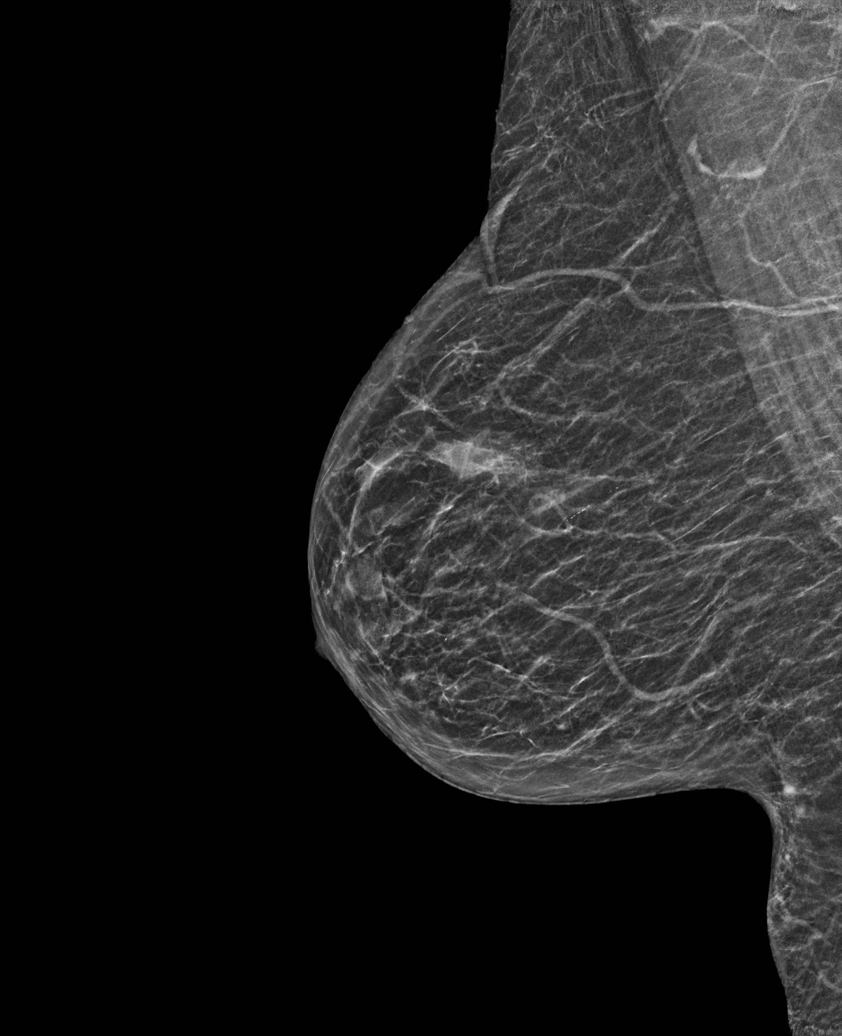

[L CC synth-2D]
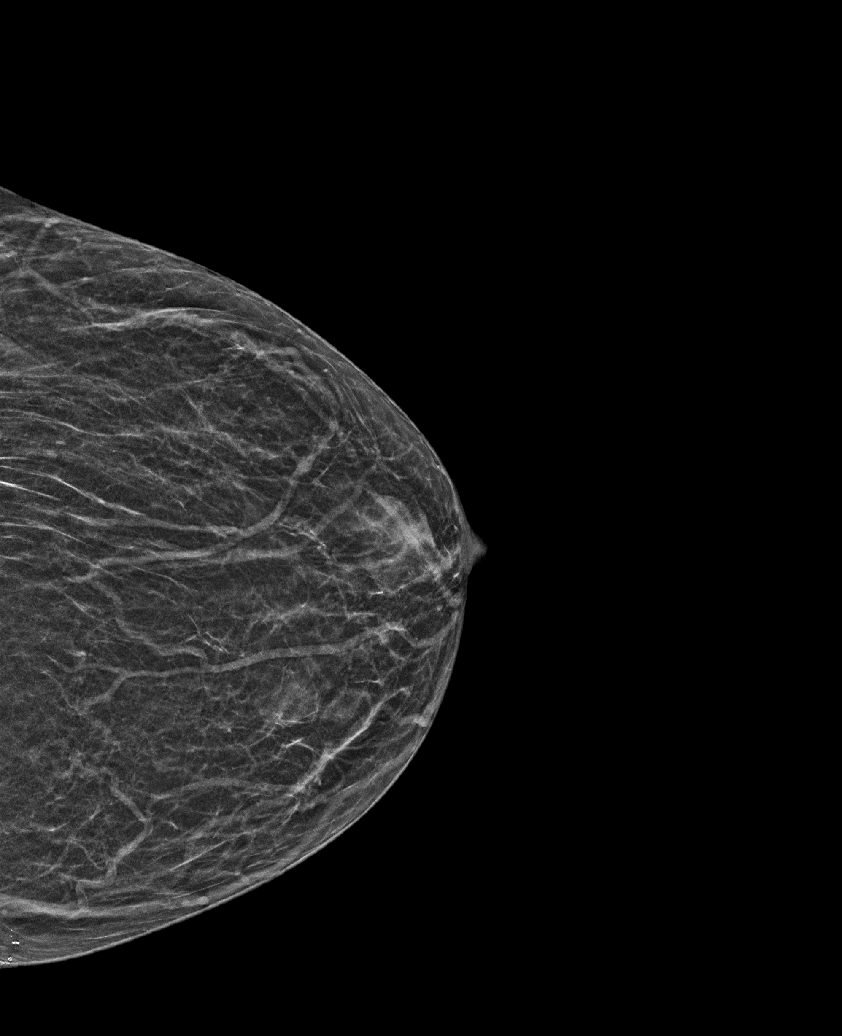

[L MLO synth-2D]
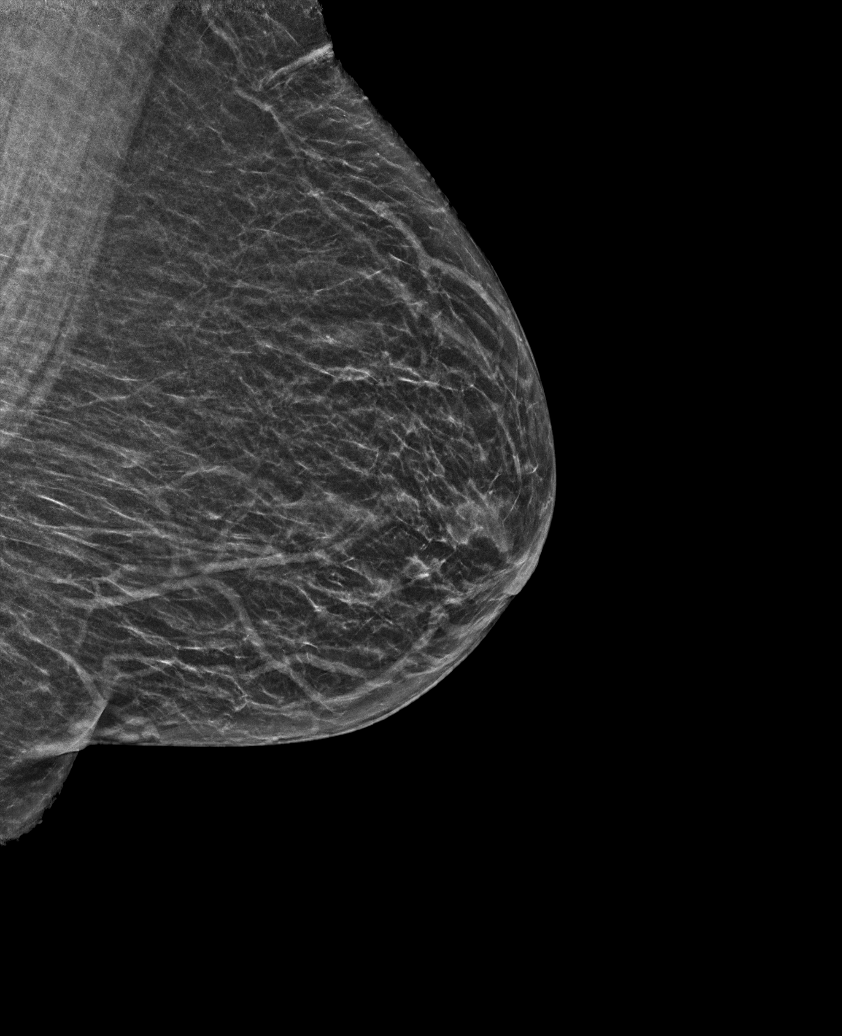

[R MLO tomo · tomo slice 23/46.0]
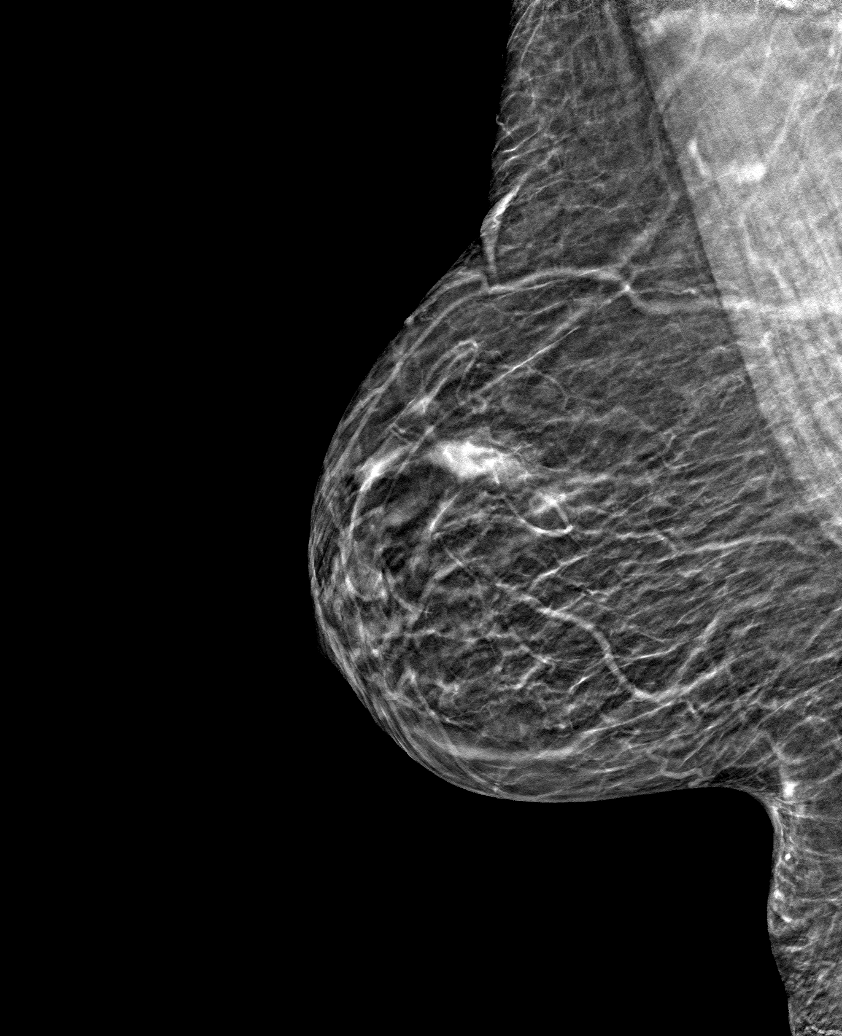

[6 of 30 positions shown; findings below may reference images not displayed]

ACR Breast Density Category b: There are scattered areas of
fibroglandular density.
FINDINGS: There are no findings suspicious for malignancy.
IMPRESSION: No mammographic evidence of malignancy. A result letter of this
screening mammogram will be mailed directly to the patient.

RECOMMENDATION:
Screening mammogram in one year. (Code:[BY])

BI-RADS CATEGORY  1: Negative.

## 2021-12-23 ENCOUNTER — Telehealth: Payer: Self-pay

## 2021-12-23 DIAGNOSIS — N3946 Mixed incontinence: Secondary | ICD-10-CM

## 2021-12-23 MED ORDER — SOLIFENACIN SUCCINATE 5 MG PO TABS: 5.0000 mg | ORAL_TABLET | Freq: Every day | ORAL | 1 refills | Status: DC

## 2021-12-23 MED ORDER — OXYBUTYNIN CHLORIDE ER 10 MG PO TB24: 10.0000 mg | ORAL_TABLET | Freq: Every day | ORAL | 1 refills | Status: DC

## 2021-12-23 NOTE — Telephone Encounter (Signed)
Incoming call from [facility] stating that the patient has transferred her medications to them and needs Vesicare and Oxybutynin transferred. Medications moved.  ?

## 2022-03-30 ENCOUNTER — Other Ambulatory Visit: Payer: Self-pay

## 2022-03-30 DIAGNOSIS — N3946 Mixed incontinence: Secondary | ICD-10-CM

## 2022-03-30 MED ORDER — SOLIFENACIN SUCCINATE 5 MG PO TABS: 5.0000 mg | ORAL_TABLET | Freq: Every day | ORAL | 1 refills | Status: DC

## 2022-03-30 MED ORDER — OXYBUTYNIN CHLORIDE ER 10 MG PO TB24: 10.0000 mg | ORAL_TABLET | Freq: Every day | ORAL | 1 refills | Status: DC

## 2022-03-30 NOTE — Telephone Encounter (Signed)
Medication refilled

## 2022-04-03 ENCOUNTER — Emergency Department: Payer: Medicare Other

## 2022-04-03 ENCOUNTER — Encounter: Payer: Self-pay | Admitting: Emergency Medicine

## 2022-04-03 ENCOUNTER — Emergency Department
Admission: EM | Admit: 2022-04-03 | Discharge: 2022-04-03 | Disposition: A | Discharge status: Home / Self Care | Payer: Medicare Other | Attending: Emergency Medicine | Admitting: Emergency Medicine

## 2022-04-03 ENCOUNTER — Other Ambulatory Visit: Payer: Self-pay

## 2022-04-03 DIAGNOSIS — R079 Chest pain, unspecified: Secondary | ICD-10-CM | POA: Diagnosis present

## 2022-04-03 DIAGNOSIS — J449 Chronic obstructive pulmonary disease, unspecified: Secondary | ICD-10-CM | POA: Insufficient documentation

## 2022-04-03 DIAGNOSIS — I10 Essential (primary) hypertension: Secondary | ICD-10-CM | POA: Diagnosis not present

## 2022-04-03 DIAGNOSIS — I3139 Other pericardial effusion (noninflammatory): Secondary | ICD-10-CM | POA: Insufficient documentation

## 2022-04-03 DIAGNOSIS — R7989 Other specified abnormal findings of blood chemistry: Secondary | ICD-10-CM | POA: Insufficient documentation

## 2022-04-03 LAB — CBC
HCT: 51.3 % — ABNORMAL HIGH (ref 36.0–46.0)
Hemoglobin: 16.9 g/dL — ABNORMAL HIGH (ref 12.0–15.0)
MCH: 29.9 pg (ref 26.0–34.0)
MCHC: 32.9 g/dL (ref 30.0–36.0)
MCV: 90.8 fL (ref 80.0–100.0)
Platelets: 246 10*3/uL (ref 150–400)
RBC: 5.65 MIL/uL — ABNORMAL HIGH (ref 3.87–5.11)
RDW: 13.8 % (ref 11.5–15.5)
WBC: 8.1 10*3/uL (ref 4.0–10.5)
nRBC: 0 % (ref 0.0–0.2)

## 2022-04-03 LAB — BASIC METABOLIC PANEL
Anion gap: 11 (ref 5–15)
BUN: 9 mg/dL (ref 8–23)
CO2: 23 mmol/L (ref 22–32)
Calcium: 10.4 mg/dL — ABNORMAL HIGH (ref 8.9–10.3)
Chloride: 106 mmol/L (ref 98–111)
Creatinine, Ser: 0.62 mg/dL (ref 0.44–1.00)
GFR, Estimated: >60 mL/min (ref >60)
Glucose, Bld: 123 mg/dL — ABNORMAL HIGH (ref 70–99)
Potassium: 3.8 mmol/L (ref 3.5–5.1)
Sodium: 140 mmol/L (ref 135–145)

## 2022-04-03 LAB — D-DIMER, QUANTITATIVE: D-Dimer, Quant: 3.83 ug/mL-FEU — ABNORMAL HIGH (ref 0.00–0.50)

## 2022-04-03 LAB — TROPONIN I (HIGH SENSITIVITY): Troponin I (High Sensitivity): 8 ng/L (ref <18)

## 2022-04-03 MED ORDER — IOHEXOL 350 MG/ML SOLN
75.0000 mL | Freq: Once | INTRAVENOUS | Status: AC | PRN
  Administered 2022-04-03: 75 mL via INTRAVENOUS

## 2022-04-03 MED ORDER — IPRATROPIUM-ALBUTEROL 0.5-2.5 (3) MG/3ML IN SOLN
3.0000 mL | Freq: Once | RESPIRATORY_TRACT | Status: AC
  Administered 2022-04-03: 3 mL via RESPIRATORY_TRACT
  Filled 2022-04-03: qty 3

## 2022-04-03 NOTE — ED Provider Notes (Signed)
[facility] Provider Note    Event Date/Time   First MD Initiated Contact with Patient 04/03/22 1252     (approximate)   History   Chief Complaint Chest Pain   HPI  Teresa Lang is a 66 y.o. female with past medical history of hypertension, hyperlipidemia, COPD, rheumatoid arthritis, and alcohol abuse who presents to the ED complaining of chest pain.  Patient reports that she has been dealing with about 2 weeks of constant sharp pain in the center of her chest.  She states that is exacerbated by when she takes a deep breath.  She reports feeling chronically short of breath but it has been no worse than usual recently and she denies any associated fevers or cough.  She has not had any abdominal pain, nausea, vomiting, or diarrhea.  She has been using her albuterol inhaler at home with relief of her usual difficulty breathing but no relief of her chest pain.  She has not noticed any pain or swelling in her legs.     Physical Exam   Triage Vital Signs: ED Triage Vitals  Enc Vitals Group     BP 04/03/22 1207 (!) 135/94     Pulse Rate 04/03/22 1207 93     Resp 04/03/22 1207 20     Temp 04/03/22 1207 99.1 F (37.3 C)     Temp Source 04/03/22 1207 Oral     SpO2 04/03/22 1207 93 %     Weight 04/03/22 1201 146 lb (66.2 kg)     Height 04/03/22 1201 5\' 5"  (1.651 m)     Head Circumference --      Peak Flow --      Pain Score 04/03/22 1200 8     Pain Loc --      Pain Edu? --      Excl. in GC? --     Most recent vital signs: Vitals:   04/03/22 1207 04/03/22 1310  BP: (!) 135/94 (!) 141/81  Pulse: 93 80  Resp: 20 18  Temp: 99.1 F (37.3 C) 98.6 F (37 C)  SpO2: 93% 94%    Constitutional: Alert and oriented. Eyes: Conjunctivae are normal. Head: Atraumatic. Nose: No congestion/rhinnorhea. Mouth/Throat: Mucous membranes are moist.  Cardiovascular: Normal rate, regular rhythm. Grossly normal heart sounds.  2+ radial pulses  bilaterally. Respiratory: Normal respiratory effort.  No retractions. Lungs CTAB.  No chest wall tenderness to palpation. Gastrointestinal: Soft and nontender. No distention. Musculoskeletal: No lower extremity tenderness nor edema.  Neurologic:  Normal speech and language. No gross focal neurologic deficits are appreciated.    ED Results / Procedures / Treatments   Labs (all labs ordered are listed, but only abnormal results are displayed) Labs Reviewed  BASIC METABOLIC PANEL - Abnormal; Notable for the following components:      Result Value   Glucose, Bld 123 (*)    Calcium 10.4 (*)    All other components within normal limits  CBC - Abnormal; Notable for the following components:   RBC 5.65 (*)    Hemoglobin 16.9 (*)    HCT 51.3 (*)    All other components within normal limits  D-DIMER, QUANTITATIVE - Abnormal; Notable for the following components:   D-Dimer, Quant 3.83 (*)    All other components within normal limits  TROPONIN I (HIGH SENSITIVITY)     EKG  ED ECG REPORT I, Chesley Noon, the attending physician, personally viewed and interpreted this ECG.   Date: 04/03/2022  EKG Time: 12:05  Rate: 86  Rhythm: normal sinus rhythm  Axis: Normal  Intervals:none  ST&T Change: None  RADIOLOGY Chest x-ray reviewed and interpreted by me with no infiltrate, edema, or effusion.  PROCEDURES:  Critical Care performed: No  Procedures   MEDICATIONS ORDERED IN ED: Medications  ipratropium-albuterol (DUONEB) 0.5-2.5 (3) MG/3ML nebulizer solution 3 mL (3 mLs Nebulization Given 04/03/22 1314)  iohexol (OMNIPAQUE) 350 MG/ML injection 75 mL (75 mLs Intravenous Contrast Given 04/03/22 1459)     IMPRESSION / MDM / ASSESSMENT AND PLAN / ED COURSE  I reviewed the triage vital signs and the nursing notes.                              66 y.o. female with past medical history of hypertension, hyperlipidemia, COPD, rheumatoid arthritis, and chronic pain syndrome who presents  to the ED complaining of 2 weeks of constant sharp pain in her chest worse with a deep breath.  Patient's presentation is most consistent with acute presentation with potential threat to life or bodily function.  Differential diagnosis includes, but is not limited to, ACS, PE, pneumonia, pneumothorax, dissection, GERD, musculoskeletal pain, and anxiety.  Patient well-appearing and in no acute distress, vital signs are unremarkable and EKG shows no evidence of arrhythmia or ischemia.  Symptoms are not atypical for ACS, we will check troponin but if this is normal I doubt ACS given constant symptoms for 2 weeks.  Chest x-ray is unremarkable with no evidence of pneumonia, but we will need to further assess for PE.  Given patient is low risk by [place] we will screen D-dimer.  She has no significant wheezing on exam at this time but given her history we will trial DuoNeb to see if this helps with her symptoms.  D-dimer is elevated, however CTA is negative for PE.  Patient noted to have small pericardial effusion and symptoms could be related to pericarditis although no EKG findings to support this.  She was also noted to have evidence of esophageal thickening, would favor this being related to GERD.  She is appropriate for discharge home with PCP and cardiology follow-up, was counseled to return to the ED for new or worsening symptoms.  Patient and family agree with plan.      FINAL CLINICAL IMPRESSION(S) / ED DIAGNOSES   Final diagnoses:  Nonspecific chest pain  Pericardial effusion     Rx / DC Orders   ED Discharge Orders     None        Note:  This document was prepared using Dragon voice recognition software and may include unintentional dictation errors.   Chesley Noon, MD 04/03/22 [phone]

## 2022-05-02 ENCOUNTER — Other Ambulatory Visit
Admission: RE | Admit: 2022-05-02 | Discharge: 2022-05-02 | Disposition: A | Discharge status: Home / Self Care | Payer: Medicare Other | Source: Ambulatory Visit | Attending: Student | Admitting: Student

## 2022-05-02 DIAGNOSIS — R410 Disorientation, unspecified: Secondary | ICD-10-CM | POA: Diagnosis present

## 2022-05-02 LAB — LITHIUM LEVEL: Lithium Lvl: 0.37 mmol/L — ABNORMAL LOW (ref 0.60–1.20)

## 2022-05-10 ENCOUNTER — Ambulatory Visit: Payer: Medicare Other | Admitting: Cardiovascular Disease

## 2022-05-15 ENCOUNTER — Encounter: Payer: Self-pay | Admitting: Urology

## 2022-05-15 ENCOUNTER — Ambulatory Visit: Payer: Medicare Other | Admitting: Urology

## 2022-05-15 VITALS — BP 132/82 | HR 73 | Ht 65.0 in | Wt 150.0 lb

## 2022-05-15 DIAGNOSIS — N3946 Mixed incontinence: Secondary | ICD-10-CM

## 2022-05-15 LAB — URINALYSIS, COMPLETE
Bilirubin, UA: NEGATIVE
Glucose, UA: NEGATIVE
Ketones, UA: NEGATIVE
Nitrite, UA: NEGATIVE
Protein,UA: NEGATIVE
RBC, UA: NEGATIVE
Specific Gravity, UA: 1.015 (ref 1.005–1.030)
Urobilinogen, Ur: 0.2 mg/dL (ref 0.2–1.0)
pH, UA: 7 (ref 5.0–7.5)

## 2022-05-15 LAB — MICROSCOPIC EXAMINATION: Epithelial Cells (non renal): >10 /hpf — AB (ref 0–10)

## 2022-05-15 NOTE — Progress Notes (Signed)
05/15/2022 8:21 AM   Teresa Lang 1956/02/08 [id number]  Referring provider: Patrice Paradise, MD [address] [facility] [address] [place] [place]  [place] [address]  No chief complaint on file.   HPI: I was consulted to assess the patient is urinary incontinence.  She does not leak with coughing sneezing bending or lifting.  She has urge incontinence.  I think she is small volume bedwetting.  She wears 5 pads a day moderately wet or soaked.   She gets up 4-5 times at night.  She can hold urination for many hours and some days she does not void at all.  She does feel like she needs to urinate first thing in the morning   For 2 months her urine is dark with an odor.   She has had 5 neck operations from previous accident and a hysterectomy is prone to constipation.  Has a smoking history   Patient has urge incontinence.  She voids very infrequently.  She has significant nocturia.  Her urine looked infected today.  She had microscopic hematuria.  The patient is a bit vague and spoke about dark urine.  Hopefully she just has a bladder infection but I I will do a hematuria work-up.     Urine culture positive.  CT scan negative cystoscopy normal   Patient said dark urine went away but she still has urgency incontinence   Reassess in 6 weeks on Myrbetriq 50 mg samples and prescription.  Call if urine culture is positive.  If urine culture positive consider prophylaxis.   Patient switched to oxybutynin 10 mg. Patient 50% improved.  Less frequency.  Less urge incontinence.  Still wearing 3-4 pads a day with variable amount of leakage.  Clinically not infected   On urodynamics she voided less than 20 mL and was catheterized for 300 mL.  Patient's maximum bladder capacity was 513 mL.  Bladder was unstable reaching a pressure of 7 cm of water.  She did not feel the low pressure contraction.  She then had an unstable bladder contraction 26 cm of water and leaked with urgency  office contraction.  She had no stress incontinence with a Valsalva pressure 149 cm of water.  During voluntary voiding she voided 491 mL.  Maximum flow was 26 mils per second.  Maximum voiding pressure 20 cm of water.  Residual was 30 mL.  EMG activity was noted during voiding.  Bladder neck descended 2 cm.  she had a normal flow curve during the voluntary void she had mild bladder trabeculation.     Patient primarily has an overactive bladder.  Like to try 1 more medication.  If this fails I will discussed 3 refractory therapies with her.  She continues to be moderately improved on the oxybutynin so may combine this with Vesicare 5 mg.  Discussed 3 refractory therapies next visit depending on how she does.  If she does really well I can always stop the oxybutynin   Today Frequency stable.  Dramatic improvement in continence.  She was smiling because she is so happy.  No infections.  Tolerating both medications well   Today When patient stopped her Vesicare and she started leaking again.  Both medications together worked Agricultural consultant.    Today Urgent continence dramatically better.  No infections.  Frequency stable. PMH: Past Medical History:  Diagnosis Date   Allergy    seasonal   Anxiety    Arthritis    Asthma    COPD (chronic obstructive pulmonary  disease) (HCC)    Depression    Dyspnea    GERD (gastroesophageal reflux disease)    Hyperlipidemia    Hypertension     Surgical History: Past Surgical History:  Procedure Laterality Date   ABDOMINAL HYSTERECTOMY  1983   CERVICAL DISC SURGERY     CHOLECYSTECTOMY  1983   HIP ARTHROPLASTY Right 03/20/2021   Procedure: ARTHROPLASTY BIPOLAR HIP (HEMIARTHROPLASTY);  Surgeon: Christena Flake, MD;  Location: [facility] ORS;  Service: Orthopedics;  Laterality: Right;   LUMBAR LAMINECTOMY/DECOMPRESSION MICRODISCECTOMY Right 11/24/2019   Procedure: OPEN L3/4 LAMINECTOMY WITH CYST REMOVAL;  Surgeon: Lucy Chris, MD;  Location: [facility] ORS;  Service:  Neurosurgery;  Laterality: Right;   SPINE SURGERY  beginning 1994   laminectomies, fusions    Home Medications:  Allergies as of 05/15/2022       Reactions   Prednisone Anxiety        Medication List        Accurate as of May 15, 2022  8:21 AM. If you have any questions, ask your nurse or doctor.          albuterol 108 (90 Base) MCG/ACT inhaler Commonly known as: VENTOLIN HFA Inhale 2 puffs into the lungs every 6 (six) hours as needed for wheezing or shortness of breath.   ARIPiprazole 15 MG tablet Commonly known as: ABILIFY Take 15 mg by mouth daily.   atorvastatin 20 MG tablet Commonly known as: LIPITOR Take 20 mg by mouth daily.   busPIRone 10 MG tablet Commonly known as: BUSPAR Take 10 mg by mouth 3 (three) times daily.   diltiazem 240 MG 24 hr capsule Commonly known as: CARDIZEM CD Take 240 mg by mouth daily.   DULoxetine 60 MG capsule Commonly known as: CYMBALTA Take 120 mg by mouth daily.   enoxaparin 40 MG/0.4ML injection Commonly known as: LOVENOX Inject 0.4 mLs (40 mg total) into the skin daily.   folic acid 1 MG tablet Commonly known as: FOLVITE Take 1 tablet (1 mg total) by mouth daily.   lisinopril 20 MG tablet Commonly known as: ZESTRIL Take 1 tablet (20 mg total) by mouth daily.   lithium carbonate 300 MG CR tablet Commonly known as: LITHOBID Take 300 mg by mouth at bedtime.   LORazepam 1 MG tablet Commonly known as: ATIVAN Take 1 mg by mouth at bedtime as needed for sleep.   methotrexate 2.5 MG tablet Commonly known as: RHEUMATREX Take 17.5 mg by mouth every Sunday.   multivitamin tablet Take 1 tablet by mouth daily.   omeprazole 40 MG capsule Commonly known as: PRILOSEC Take 40 mg by mouth daily as needed (acid reflux symptoms).   ondansetron 4 MG tablet Commonly known as: ZOFRAN Take 1 tablet (4 mg total) by mouth every 6 (six) hours as needed for nausea.   oxybutynin 10 MG 24 hr tablet Commonly known as:  DITROPAN-XL Take 1 tablet (10 mg total) by mouth at bedtime.   oxyCODONE 5 MG immediate release tablet Commonly known as: Oxy IR/ROXICODONE Take 1 tablet (5 mg total) by mouth every 4 (four) hours as needed for moderate pain.   potassium chloride 10 MEQ tablet Commonly known as: KLOR-CON Take 10 mEq by mouth daily.   solifenacin 5 MG tablet Commonly known as: VESICARE Take 1 tablet (5 mg total) by mouth daily.   Trelegy Ellipta 100-62.5-25 MCG/ACT Aepb Generic drug: Fluticasone-Umeclidin-Vilant Inhale 2 puffs into the lungs daily.        Allergies:  Allergies  Allergen Reactions  Prednisone Anxiety    Family History: Family History  Problem Relation Age of Onset   Celiac disease Sister    Breast cancer Neg Hx     Social History:  reports that she has quit smoking. Her smoking use included cigarettes. She started smoking about 61 years ago. She has a 14.25 pack-year smoking history. She has never used smokeless tobacco. She reports that she does not currently use drugs after having used the following drugs: Marijuana. She reports that she does not drink alcohol.  ROS:                                        Physical Exam: There were no vitals taken for this visit.  Constitutional:  Alert and oriented, No acute distress. HEENT: [place] AT, moist mucus membranes.  Trachea midline, no masses. C Laboratory Data: Lab Results  Component Value Date   WBC 8.1 04/03/2022   HGB 16.9 (H) 04/03/2022   HCT 51.3 (H) 04/03/2022   MCV 90.8 04/03/2022   PLT 246 04/03/2022    Lab Results  Component Value Date   CREATININE 0.62 04/03/2022    No results found for: "PSA"  No results found for: "TESTOSTERONE"  Lab Results  Component Value Date   HGBA1C 5.5 03/21/2019    Urinalysis    Component Value Date/Time   COLORURINE AMBER (A) 11/20/2019 0804   APPEARANCEUR Cloudy (A) 09/06/2020 0758   LABSPEC 1.016 11/20/2019 0804   PHURINE 7.0 11/20/2019  0804   GLUCOSEU Negative 09/06/2020 0758   HGBUR NEGATIVE 11/20/2019 0804   BILIRUBINUR Negative 09/06/2020 0758   KETONESUR NEGATIVE 11/20/2019 0804   PROTEINUR Negative 09/06/2020 0758   PROTEINUR 30 (A) 11/20/2019 0804   NITRITE Negative 09/06/2020 0758   NITRITE NEGATIVE 11/20/2019 0804   LEUKOCYTESUR 1+ (A) 09/06/2020 0758   LEUKOCYTESUR NEGATIVE 11/20/2019 0804    Pertinent Imaging: Oxybutynin Vesicare 90x3 sent to pharmacy and I will see in 1 year  Assessment & Plan:Oxybutynin Vesicare 90x3 sent to pharmacy and I will see in 1 yea  There are no diagnoses linked to this encounter.  No follow-ups on file.  Martina Sinner, MD  [facility] [address] [place], [place] [address] [phone]

## 2022-06-16 ENCOUNTER — Other Ambulatory Visit: Payer: Self-pay | Admitting: Neurology

## 2022-06-16 ENCOUNTER — Other Ambulatory Visit (HOSPITAL_COMMUNITY): Payer: Self-pay | Admitting: Neurology

## 2022-06-16 DIAGNOSIS — R413 Other amnesia: Secondary | ICD-10-CM

## 2022-06-25 ENCOUNTER — Ambulatory Visit (HOSPITAL_COMMUNITY)
Admission: RE | Admit: 2022-06-25 | Discharge: 2022-06-25 | Disposition: A | Discharge status: Home / Self Care | Payer: Medicare Other | Source: Ambulatory Visit | Attending: Neurology | Admitting: Neurology

## 2022-06-25 DIAGNOSIS — R413 Other amnesia: Secondary | ICD-10-CM | POA: Insufficient documentation

## 2022-06-25 MED ORDER — GADOPICLENOL 0.5 MMOL/ML IV SOLN
7.0000 mL | Freq: Once | INTRAVENOUS | Status: AC | PRN
  Administered 2022-06-25: 7 mL via INTRAVENOUS

## 2022-08-07 ENCOUNTER — Encounter: Payer: Self-pay | Admitting: *Deleted

## 2022-08-08 ENCOUNTER — Encounter: Payer: Self-pay | Admitting: *Deleted

## 2022-08-08 ENCOUNTER — Ambulatory Visit: Payer: Medicare Other | Admitting: Anesthesiology

## 2022-08-08 ENCOUNTER — Encounter
Admission: RE | Disposition: A | Discharge status: Home / Self Care | Payer: Self-pay | Source: Home / Self Care | Attending: Gastroenterology

## 2022-08-08 ENCOUNTER — Ambulatory Visit
Admission: RE | Admit: 2022-08-08 | Discharge: 2022-08-08 | Disposition: A | Discharge status: Home / Self Care | Payer: Medicare Other | Attending: Gastroenterology | Admitting: Gastroenterology

## 2022-08-08 DIAGNOSIS — Z90710 Acquired absence of both cervix and uterus: Secondary | ICD-10-CM | POA: Insufficient documentation

## 2022-08-08 DIAGNOSIS — K5730 Diverticulosis of large intestine without perforation or abscess without bleeding: Secondary | ICD-10-CM | POA: Diagnosis not present

## 2022-08-08 DIAGNOSIS — E785 Hyperlipidemia, unspecified: Secondary | ICD-10-CM | POA: Insufficient documentation

## 2022-08-08 DIAGNOSIS — R933 Abnormal findings on diagnostic imaging of other parts of digestive tract: Secondary | ICD-10-CM | POA: Diagnosis not present

## 2022-08-08 DIAGNOSIS — Z9049 Acquired absence of other specified parts of digestive tract: Secondary | ICD-10-CM | POA: Diagnosis not present

## 2022-08-08 DIAGNOSIS — Z87891 Personal history of nicotine dependence: Secondary | ICD-10-CM | POA: Diagnosis not present

## 2022-08-08 DIAGNOSIS — J449 Chronic obstructive pulmonary disease, unspecified: Secondary | ICD-10-CM | POA: Insufficient documentation

## 2022-08-08 DIAGNOSIS — Z1211 Encounter for screening for malignant neoplasm of colon: Secondary | ICD-10-CM | POA: Insufficient documentation

## 2022-08-08 DIAGNOSIS — K2100 Gastro-esophageal reflux disease with esophagitis, without bleeding: Secondary | ICD-10-CM | POA: Diagnosis not present

## 2022-08-08 DIAGNOSIS — I10 Essential (primary) hypertension: Secondary | ICD-10-CM | POA: Insufficient documentation

## 2022-08-08 DIAGNOSIS — Z86010 Personal history of colonic polyps: Secondary | ICD-10-CM | POA: Diagnosis not present

## 2022-08-08 HISTORY — PX: ESOPHAGOGASTRODUODENOSCOPY: SHX5428

## 2022-08-08 HISTORY — PX: COLONOSCOPY WITH PROPOFOL: SHX5780

## 2022-08-08 SURGERY — COLONOSCOPY WITH PROPOFOL
Anesthesia: General

## 2022-08-08 MED ORDER — PHENYLEPHRINE HCL (PRESSORS) 10 MG/ML IV SOLN
INTRAVENOUS | Status: DC | PRN
  Administered 2022-08-08 (×3): 80 ug via INTRAVENOUS

## 2022-08-08 MED ORDER — PROPOFOL 10 MG/ML IV BOLUS
INTRAVENOUS | Status: DC | PRN
  Administered 2022-08-08 (×2): 50 mg via INTRAVENOUS

## 2022-08-08 MED ORDER — MIDAZOLAM HCL 2 MG/2ML IJ SOLN
INTRAMUSCULAR | Status: AC
  Filled 2022-08-08: qty 2

## 2022-08-08 MED ORDER — SODIUM CHLORIDE 0.9 % IV SOLN: INTRAVENOUS | Status: DC

## 2022-08-08 MED ORDER — FENTANYL CITRATE (PF) 100 MCG/2ML IJ SOLN
INTRAMUSCULAR | Status: AC
  Filled 2022-08-08: qty 2

## 2022-08-08 MED ORDER — FENTANYL CITRATE (PF) 100 MCG/2ML IJ SOLN
INTRAMUSCULAR | Status: DC | PRN
  Administered 2022-08-08 (×4): 25 ug via INTRAVENOUS

## 2022-08-08 MED ORDER — PROPOFOL 10 MG/ML IV BOLUS
INTRAVENOUS | Status: AC
  Filled 2022-08-08: qty 20

## 2022-08-08 MED ORDER — MIDAZOLAM HCL 2 MG/2ML IJ SOLN
INTRAMUSCULAR | Status: DC | PRN
  Administered 2022-08-08 (×4): .5 mg via INTRAVENOUS

## 2022-08-08 MED ORDER — PROPOFOL 500 MG/50ML IV EMUL
INTRAVENOUS | Status: DC | PRN
  Administered 2022-08-08: 100 ug/kg/min via INTRAVENOUS

## 2022-08-08 NOTE — Anesthesia Preprocedure Evaluation (Signed)
Anesthesia Evaluation  Patient identified by MRN, date of birth, ID band Patient awake    Reviewed: Allergy & Precautions, NPO status , Patient's Chart, lab work & pertinent test results  Airway Mallampati: III  TM Distance: >3 FB Neck ROM: full    Dental  (+) Edentulous Lower, Upper Dentures   Pulmonary shortness of breath, asthma , COPD,  COPD inhaler, Current Smoker and Patient abstained from smoking.,   Very mild expiratory wheezes   + wheezing      Cardiovascular hypertension, negative cardio ROS Normal cardiovascular exam  EF 60-65%   Neuro/Psych  Headaches, PSYCHIATRIC DISORDERS Anxiety Depression  Neuromuscular disease    GI/Hepatic negative GI ROS, Neg liver ROS,   Endo/Other  negative endocrine ROS  Renal/GU negative Renal ROS  negative genitourinary   Musculoskeletal   Abdominal   Peds  Hematology negative hematology ROS (+)   Anesthesia Other Findings Past Medical History: No date: Allergy     Comment:  seasonal No date: Anxiety No date: Arthritis No date: Asthma No date: COPD (chronic obstructive pulmonary disease) (HCC) No date: Depression No date: Dyspnea No date: GERD (gastroesophageal reflux disease) No date: Hyperlipidemia No date: Hypertension  Past Surgical History: 1983: ABDOMINAL HYSTERECTOMY No date: [place]: CHOLECYSTECTOMY 03/20/2021: HIP ARTHROPLASTY; Right     Comment:  Procedure: ARTHROPLASTY BIPOLAR HIP (HEMIARTHROPLASTY);               Surgeon: Corky Mull, MD;  Location: [facility] ORS;                Service: Orthopedics;  Laterality: Right; 11/24/2019: LUMBAR LAMINECTOMY/DECOMPRESSION MICRODISCECTOMY; Right     Comment:  Procedure: OPEN L3/4 LAMINECTOMY WITH CYST REMOVAL;                Surgeon: Deetta Perla, MD;  Location: [facility] ORS;  Service:              Neurosurgery;  Laterality: Right; beginning 1994: SPINE SURGERY     Comment:  laminectomies,  fusions  BMI    Body Mass Index: 26.63 kg/m      Reproductive/Obstetrics negative OB ROS                             Anesthesia Physical Anesthesia Plan  ASA: 3  Anesthesia Plan: General   Post-op Pain Management:    Induction: Intravenous  PONV Risk Score and Plan: Propofol infusion and TIVA  Airway Management Planned: Natural Airway and Nasal Cannula  Additional Equipment:   Intra-op Plan:   Post-operative Plan:   Informed Consent: I have reviewed the patients History and Physical, chart, labs and discussed the procedure including the risks, benefits and alternatives for the proposed anesthesia with the patient or authorized representative who has indicated his/her understanding and acceptance.     Dental Advisory Given  Plan Discussed with: Anesthesiologist, CRNA and Surgeon  Anesthesia Plan Comments: (Patient consented for risks of anesthesia including but not limited to:  - adverse reactions to medications - risk of airway placement if required - damage to eyes, teeth, lips or other oral mucosa - nerve damage due to positioning  - sore throat or hoarseness - Damage to heart, brain, nerves, lungs, other parts of body or loss of life  Patient voiced understanding.)        Anesthesia Quick Evaluation

## 2022-08-08 NOTE — Op Note (Signed)
[facility] Patient Name: Teresa Lang Procedure Date: 08/08/2022 7:14 AM MRN: [id number] Account #: [id number] Date of Birth: [birth year] Admit Type: Outpatient Age: 66 Room: [facility] ENDO ROOM 1 Gender: Female Note Status: Finalized Instrument Name: Upper Endoscope [id number] Procedure:             Upper GI endoscopy Indications:           Abnormal CT of the GI tract Providers:             Andrey Farmer MD, MD Referring MD:          Precious Bard, MD (Referring MD) Medicines:             Monitored Anesthesia Care Complications:         No immediate complications. Procedure:             Pre-Anesthesia Assessment:                        - Prior to the procedure, a History and Physical was                         performed, and patient medications and allergies were                         reviewed. The patient is competent. The risks and                         benefits of the procedure and the sedation options and                         risks were discussed with the patient. All questions                         were answered and informed consent was obtained.                         Patient identification and proposed procedure were                         verified by the physician, the nurse, the                         anesthesiologist, the anesthetist and the technician                         in the endoscopy suite. Mental Status Examination:                         alert and oriented. Airway Examination: normal                         oropharyngeal airway and neck mobility. Respiratory                         Examination: clear to auscultation. CV Examination:                         normal. Prophylactic Antibiotics: The patient does not  require prophylactic antibiotics. Prior                         Anticoagulants: The patient has taken no anticoagulant                         or antiplatelet agents except for  aspirin. ASA Grade                         Assessment: III - A patient with severe systemic                         disease. After reviewing the risks and benefits, the                         patient was deemed in satisfactory condition to                         undergo the procedure. The anesthesia plan was to use                         monitored anesthesia care (MAC). Immediately prior to                         administration of medications, the patient was                         re-assessed for adequacy to receive sedatives. The                         heart rate, respiratory rate, oxygen saturations,                         blood pressure, adequacy of pulmonary ventilation, and                         response to care were monitored throughout the                         procedure. The physical status of the patient was                         re-assessed after the procedure.                        After obtaining informed consent, the endoscope was                         passed under direct vision. Throughout the procedure,                         the patient's blood pressure, pulse, and oxygen                         saturations were monitored continuously. The Endoscope                         was introduced through the mouth, and advanced to the  second part of duodenum. The upper GI endoscopy was                         accomplished without difficulty. The patient tolerated                         the procedure well. Findings:      The examined esophagus was normal.      The entire examined stomach was normal.      The examined duodenum was normal. Impression:            - Normal esophagus.                        - Normal stomach.                        - Normal examined duodenum.                        - No specimens collected. Recommendation:        - Perform a colonoscopy today. Procedure Code(s):     --- Professional ---                         [id number], Esophagogastroduodenoscopy, flexible,                         transoral; diagnostic, including collection of                         specimen(s) by brushing or washing, when performed                         (separate procedure) Diagnosis Code(s):     --- Professional ---                        R93.3, Abnormal findings on diagnostic imaging of                         other parts of digestive tract CPT copyright 2022 American Medical Association. All rights reserved. The codes documented in this report are preliminary and upon coder review may  be revised to meet current compliance requirements. Andrey Farmer MD, MD 08/08/2022 8:33:13 AM Number of Addenda: 0 Note Initiated On: 08/08/2022 7:14 AM Estimated Blood Loss:  Estimated blood loss: none.      [facility]

## 2022-08-08 NOTE — H&P (Signed)
Outpatient short stay form Pre-procedure 08/08/2022  Lesly Rubenstein, MD  Primary Physician: Marinda Elk, MD  Reason for visit:  Abnormal CT and history of polyps  History of present illness:    66 y/o lady with history of mild cognitive impairment and hypertension here for EGD/Colonoscopy for CT scan showing possible esophagitis and history of polyps many years ago. No blood thinners. No family history of GI malignancies. History of hysterectomy and cholecystectomy.    Current Facility-Administered Medications:    0.9 %  sodium chloride infusion, , Intravenous, Continuous, Libero Puthoff, Hilton Cork, MD, Last Rate: 20 mL/hr at 08/08/22 0725, New Bag at 08/08/22 0725  Medications Prior to Admission  Medication Sig Dispense Refill Last Dose   ARIPiprazole (ABILIFY) 15 MG tablet Take 15 mg by mouth daily.   08/07/2022   atorvastatin (LIPITOR) 20 MG tablet Take 20 mg by mouth daily.   08/07/2022   busPIRone (BUSPAR) 10 MG tablet Take 10 mg by mouth 3 (three) times daily.   08/07/2022   diltiazem (CARDIZEM CD) 240 MG 24 hr capsule Take 240 mg by mouth daily.   08/08/2022 at 0545   DULoxetine (CYMBALTA) 60 MG capsule Take 120 mg by mouth daily.   61/44/3154   folic acid (FOLVITE) 1 MG tablet Take 1 tablet (1 mg total) by mouth daily. 30 tablet 1 Past Week   lisinopril (ZESTRIL) 20 MG tablet Take 1 tablet (20 mg total) by mouth daily. 30 tablet 1 08/07/2022   solifenacin (VESICARE) 5 MG tablet Take 1 tablet (5 mg total) by mouth daily. 90 tablet 1 08/07/2022   albuterol (VENTOLIN HFA) 108 (90 Base) MCG/ACT inhaler Inhale 2 puffs into the lungs every 6 (six) hours as needed for wheezing or shortness of breath. 6.7 g 1    LORazepam (ATIVAN) 1 MG tablet Take 1 mg by mouth at bedtime as needed for sleep.      methotrexate (RHEUMATREX) 2.5 MG tablet Take 17.5 mg by mouth every Sunday.      omeprazole (PRILOSEC) 40 MG capsule Take 40 mg by mouth daily as needed (acid reflux symptoms).       ondansetron (ZOFRAN) 4 MG tablet Take 1 tablet (4 mg total) by mouth every 6 (six) hours as needed for nausea. 30 tablet 0    oxybutynin (DITROPAN-XL) 10 MG 24 hr tablet Take 1 tablet (10 mg total) by mouth at bedtime. 90 tablet 1    TRELEGY ELLIPTA 100-62.5-25 MCG/INH AEPB Inhale 2 puffs into the lungs daily.        Allergies  Allergen Reactions   Prednisone Anxiety     Past Medical History:  Diagnosis Date   Allergy    seasonal   Anxiety    Arthritis    Asthma    COPD (chronic obstructive pulmonary disease) (HCC)    Depression    Dyspnea    GERD (gastroesophageal reflux disease)    Hyperlipidemia    Hypertension     Review of systems:  Otherwise negative.    Physical Exam  Gen: Alert, oriented. Appears stated age.  HEENT: PERRLA. Lungs: No respiratory distress CV: RRR Abd: soft, benign, no masses Ext: No edema    Planned procedures: Proceed with EGD/colonoscopy. The patient understands the nature of the planned procedure, indications, risks, alternatives and potential complications including but not limited to bleeding, infection, perforation, damage to internal organs and possible oversedation/side effects from anesthesia. The patient agrees and gives consent to proceed.  Please refer to procedure notes for  findings, recommendations and patient disposition/instructions.     Lesly Rubenstein, MD [facility]

## 2022-08-08 NOTE — Transfer of Care (Signed)
Immediate Anesthesia Transfer of Care Note  Patient: Teresa Lang  Procedure(s) Performed: COLONOSCOPY WITH PROPOFOL ESOPHAGOGASTRODUODENOSCOPY (EGD)  Patient Location: PACU  Anesthesia Type:General  Level of Consciousness: awake, alert  and oriented  Airway & Oxygen Therapy: Patient Spontanous Breathing and Patient connected to face mask oxygen  Post-op Assessment: Report given to RN, Post -op Vital signs reviewed and stable and Patient moving all extremities  Post vital signs: Reviewed and stable  Last Vitals:  Vitals Value Taken Time  BP 96/46 08/08/22 0835  Temp 36.2 C 08/08/22 0832  Pulse 82 08/08/22 0835  Resp 15 08/08/22 0835  SpO2 98 % 08/08/22 0835  Vitals shown include unvalidated device data.  Last Pain:  Vitals:   08/08/22 0832  TempSrc: Tympanic  PainSc: 0-No pain         Complications: No notable events documented.

## 2022-08-08 NOTE — Anesthesia Postprocedure Evaluation (Signed)
Anesthesia Post Note  Patient: Teresa Lang  Procedure(s) Performed: COLONOSCOPY WITH PROPOFOL ESOPHAGOGASTRODUODENOSCOPY (EGD)  Patient location during evaluation: Endoscopy Anesthesia Type: General Level of consciousness: awake and alert Pain management: pain level controlled Vital Signs Assessment: post-procedure vital signs reviewed and stable Respiratory status: spontaneous breathing, nonlabored ventilation and respiratory function stable Cardiovascular status: blood pressure returned to baseline and stable Postop Assessment: no apparent nausea or vomiting Anesthetic complications: no   No notable events documented.   Last Vitals:  Vitals:   08/08/22 0836 08/08/22 0844  BP: (!) 104/57 111/60  Pulse: 78 76  Resp: 15 18  Temp:    SpO2: 96% 96%    Last Pain:  Vitals:   08/08/22 0844  TempSrc:   PainSc: 0-No pain                 Alphonsus Sias

## 2022-08-08 NOTE — Interval H&P Note (Signed)
History and Physical Interval Note:  08/08/2022 7:57 AM  Teresa Lang  has presented today for surgery, with the diagnosis of Abnormal Finding GI Tract GERD.  The various methods of treatment have been discussed with the patient and family. After consideration of risks, benefits and other options for treatment, the patient has consented to  Procedure(s): COLONOSCOPY WITH PROPOFOL (N/A) ESOPHAGOGASTRODUODENOSCOPY (EGD) (N/A) as a surgical intervention.  The patient's history has been reviewed, patient examined, no change in status, stable for surgery.  I have reviewed the patient's chart and labs.  Questions were answered to the patient's satisfaction.     Lesly Rubenstein  Ok to proceed with EGD/Colonoscopy

## 2022-08-08 NOTE — Op Note (Signed)
[facility]astroenterology Patient Name: Teresa Lang Procedure Date: 08/08/2022 7:13 AM MRN: [id number] Account #: [id number] Date of Birth: 07/10/1956 Admit Type: Outpatient Age: 66 Room: [facility] ENDO ROOM 1 Gender: Female Note Status: Finalized Instrument Name: Jasper Riling [id number] Procedure:             Colonoscopy Indications:           Surveillance: Personal history of colonic polyps                         (unknown histology) on last colonoscopy more than 5                         years ago Providers:             Andrey Farmer MD, MD Referring MD:          Precious Bard, MD (Referring MD) Medicines:             Monitored Anesthesia Care Complications:         No immediate complications. Procedure:             Pre-Anesthesia Assessment:                        - Prior to the procedure, a History and Physical was                         performed, and patient medications and allergies were                         reviewed. The patient is competent. The risks and                         benefits of the procedure and the sedation options and                         risks were discussed with the patient. All questions                         were answered and informed consent was obtained.                         Patient identification and proposed procedure were                         verified by the physician, the nurse, the                         anesthesiologist, the anesthetist and the technician                         in the endoscopy suite. Mental Status Examination:                         alert and oriented. Airway Examination: normal                         oropharyngeal airway and neck mobility. Respiratory  Examination: clear to auscultation. CV Examination:                         normal. Prophylactic Antibiotics: The patient does not                         require prophylactic antibiotics. Prior                          Anticoagulants: The patient has taken no anticoagulant                         or antiplatelet agents. ASA Grade Assessment: III - A                         patient with severe systemic disease. After reviewing                         the risks and benefits, the patient was deemed in                         satisfactory condition to undergo the procedure. The                         anesthesia plan was to use monitored anesthesia care                         (MAC). Immediately prior to administration of                         medications, the patient was re-assessed for adequacy                         to receive sedatives. The heart rate, respiratory                         rate, oxygen saturations, blood pressure, adequacy of                         pulmonary ventilation, and response to care were                         monitored throughout the procedure. The physical                         status of the patient was re-assessed after the                         procedure.                        After obtaining informed consent, the colonoscope was                         passed under direct vision. Throughout the procedure,                         the patient's blood pressure, pulse, and oxygen  saturations were monitored continuously. The                         Colonoscope was introduced through the anus and                         advanced to the the cecum, identified by appendiceal                         orifice and ileocecal valve. The colonoscopy was                         somewhat difficult due to a tortuous colon. The                         patient tolerated the procedure well. The quality of                         the bowel preparation was fair. The ileocecal valve,                         appendiceal orifice, and rectum were photographed. Findings:      The perianal and digital rectal examinations were normal.      Scattered small-mouthed  diverticula were found in the sigmoid colon,       descending colon, transverse colon and ascending colon.      The exam was otherwise without abnormality on direct and retroflexion       views. Impression:            - Preparation of the colon was fair.                        - Diverticulosis in the sigmoid colon, in the                         descending colon, in the transverse colon and in the                         ascending colon.                        - The examination was otherwise normal on direct and                         retroflexion views.                        - No specimens collected. Recommendation:        - Discharge patient to home.                        - Resume previous diet.                        - Continue present medications.                        - Repeat colonoscopy in 1 year because the bowel  preparation was suboptimal.                        - Return to referring physician as previously                         scheduled. Procedure Code(s):     --- Professional ---                        R5615, Colorectal cancer screening; colonoscopy on                         individual at high risk Diagnosis Code(s):     --- Professional ---                        Z86.010, Personal history of colonic polyps                        K57.30, Diverticulosis of large intestine without                         perforation or abscess without bleeding CPT copyright 2022 [employer]. All rights reserved. The codes documented in this report are preliminary and upon coder review may  be revised to meet current compliance requirements. Andrey Farmer MD, MD 08/08/2022 8:37:07 AM Number of Addenda: 0 Note Initiated On: 08/08/2022 7:13 AM Scope Withdrawal Time: 0 hours 6 minutes 7 seconds  Total Procedure Duration: 0 hours 17 minutes 37 seconds  Estimated Blood Loss:  Estimated blood loss: none.      [facility]

## 2022-08-09 ENCOUNTER — Encounter: Payer: Self-pay | Admitting: Gastroenterology

## 2022-08-16 ENCOUNTER — Encounter: Payer: Self-pay | Admitting: Psychiatry

## 2022-08-16 ENCOUNTER — Ambulatory Visit (INDEPENDENT_AMBULATORY_CARE_PROVIDER_SITE_OTHER): Payer: Medicare Other | Admitting: Psychiatry

## 2022-08-16 VITALS — BP 123/81 | HR 88 | Temp 98.8°F | Ht 65.0 in | Wt 165.8 lb

## 2022-08-16 DIAGNOSIS — F1121 Opioid dependence, in remission: Secondary | ICD-10-CM

## 2022-08-16 DIAGNOSIS — Z79899 Other long term (current) drug therapy: Secondary | ICD-10-CM

## 2022-08-16 DIAGNOSIS — F419 Anxiety disorder, unspecified: Secondary | ICD-10-CM | POA: Diagnosis not present

## 2022-08-16 DIAGNOSIS — F1021 Alcohol dependence, in remission: Secondary | ICD-10-CM

## 2022-08-16 DIAGNOSIS — F1221 Cannabis dependence, in remission: Secondary | ICD-10-CM | POA: Insufficient documentation

## 2022-08-16 DIAGNOSIS — F4310 Post-traumatic stress disorder, unspecified: Secondary | ICD-10-CM | POA: Diagnosis not present

## 2022-08-16 DIAGNOSIS — F32A Depression, unspecified: Secondary | ICD-10-CM | POA: Diagnosis not present

## 2022-08-16 DIAGNOSIS — R413 Other amnesia: Secondary | ICD-10-CM

## 2022-08-16 DIAGNOSIS — F1911 Other psychoactive substance abuse, in remission: Secondary | ICD-10-CM
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
## 2022-08-16 NOTE — Patient Instructions (Signed)
You are going to be tapered off of lorazepam due to long term side effects including memory problems and confusion which you already have. Please call us back if you need refills to last until your next appointment for any of your medications. However we will need records from [facility] , please sign an ROI at front desk if not already done .

## 2022-08-17 ENCOUNTER — Encounter: Payer: Self-pay | Admitting: Psychiatry

## 2022-08-24 ENCOUNTER — Other Ambulatory Visit: Payer: Self-pay | Admitting: Physician Assistant

## 2022-08-24 DIAGNOSIS — Z1231 Encounter for screening mammogram for malignant neoplasm of breast: Secondary | ICD-10-CM

## 2022-09-08 ENCOUNTER — Encounter: Payer: Self-pay | Admitting: Psychiatry

## 2022-09-08 ENCOUNTER — Ambulatory Visit (INDEPENDENT_AMBULATORY_CARE_PROVIDER_SITE_OTHER): Payer: Medicare Other | Admitting: Psychiatry

## 2022-09-08 VITALS — BP 107/65 | HR 97 | Temp 98.8°F | Ht 65.0 in | Wt 169.4 lb

## 2022-09-08 DIAGNOSIS — F419 Anxiety disorder, unspecified: Secondary | ICD-10-CM

## 2022-09-08 DIAGNOSIS — F32A Depression, unspecified: Secondary | ICD-10-CM

## 2022-09-08 DIAGNOSIS — F1221 Cannabis dependence, in remission: Secondary | ICD-10-CM

## 2022-09-08 DIAGNOSIS — F4310 Post-traumatic stress disorder, unspecified: Secondary | ICD-10-CM | POA: Diagnosis not present

## 2022-09-08 DIAGNOSIS — Z79899 Other long term (current) drug therapy: Secondary | ICD-10-CM | POA: Diagnosis not present

## 2022-09-08 DIAGNOSIS — F1021 Alcohol dependence, in remission: Secondary | ICD-10-CM

## 2022-09-08 DIAGNOSIS — F1911 Other psychoactive substance abuse, in remission: Secondary | ICD-10-CM

## 2022-09-08 DIAGNOSIS — R413 Other amnesia: Secondary | ICD-10-CM

## 2022-09-08 DIAGNOSIS — F1121 Opioid dependence, in remission: Secondary | ICD-10-CM

## 2022-09-08 MED ORDER — LORAZEPAM 0.5 MG PO TABS: 0.2500 mg | ORAL_TABLET | Freq: Every day | ORAL | 0 refills | Status: AC

## 2022-09-08 MED ORDER — TRAZODONE HCL 100 MG PO TABS: 50.0000 mg | ORAL_TABLET | Freq: Every evening | ORAL | 1 refills | Status: DC | PRN

## 2022-09-08 MED ORDER — LORAZEPAM 0.5 MG PO TABS: 0.5000 mg | ORAL_TABLET | Freq: Every day | ORAL | 0 refills | Status: AC

## 2022-09-08 NOTE — Patient Instructions (Signed)
You are being tapered off of the lorazepam, started taking lorazepam 0.5 mg - 1 tablet daily at bedtime for 7 days, then start taking half tablet of the lorazepam 0.5 mg - 0.25 mg daily at bedtime for 10 days and stop taking it.  You are currently being started on a new medication for sleep-trazodone-100 mg at bedtime.  He can take half to 1 tablet of the trazodone as needed.  Trazodone Tablets What is this medication? TRAZODONE (TRAZ oh done) treats depression. It increases the amount of serotonin in the brain, a hormone that helps regulate mood. This medicine may be used for other purposes; ask your health care provider or pharmacist if you have questions. COMMON BRAND NAME(S): Desyrel What should I tell my care team before I take this medication? They need to know if you have any of these conditions: Attempted suicide or thinking about it Bipolar disorder Bleeding problems Glaucoma Heart disease, or previous heart attack Irregular heart beat Kidney or liver disease Low levels of sodium in the blood An unusual or allergic reaction to trazodone, other medications, foods, dyes or preservatives Pregnant or trying to get pregnant Breast-feeding How should I use this medication? Take this medication by mouth with a glass of water. Follow the directions on the prescription label. Take this medication shortly after a meal or a light snack. Take your medication at regular intervals. Do not take your medication more often than directed. Do not stop taking this medication suddenly except upon the advice of your care team. Stopping this medication too quickly may cause serious side effects or your condition may worsen. A special MedGuide will be given to you by the pharmacist with each prescription and refill. Be sure to read this information carefully each time. Talk to your care team regarding the use of this medication in children. Special care may be needed. Overdosage: If you think you have  taken too much of this medicine contact a poison control center or emergency room at once. NOTE: This medicine is only for you. Do not share this medicine with others. What if I miss a dose? If you miss a dose, take it as soon as you can. If it is almost time for your next dose, take only that dose. Do not take double or extra doses. What may interact with this medication? Do not take this medication with any of the following: Certain medications for fungal infections like fluconazole, itraconazole, ketoconazole, posaconazole, voriconazole Cisapride Dronedarone Linezolid MAOIs like Carbex, Eldepryl, Marplan, Nardil, and Parnate Mesoridazine Methylene blue (injected into a vein) Pimozide Saquinavir Thioridazine This medication may also interact with the following: Alcohol Antiviral medications for HIV or AIDS Aspirin and aspirin-like medications Barbiturates like phenobarbital Certain medications for blood pressure, heart disease, irregular heart beat Certain medications for depression, anxiety, or psychotic disturbances Certain medications for migraine headache like almotriptan, eletriptan, frovatriptan, naratriptan, rizatriptan, sumatriptan, zolmitriptan Certain medications for seizures like carbamazepine and phenytoin Certain medications for sleep Certain medications that treat or prevent blood clots like dalteparin, enoxaparin, warfarin Digoxin Fentanyl Lithium NSAIDS, medications for pain and inflammation, like ibuprofen or naproxen Other medications that prolong the QT interval (cause an abnormal heart rhythm) like dofetilide Rasagiline Supplements like St. John's wort, kava kava, valerian Tramadol Tryptophan This list may not describe all possible interactions. Give your health care provider a list of all the medicines, herbs, non-prescription drugs, or dietary supplements you use. Also tell them if you smoke, drink alcohol, or use illegal drugs. Some items may  interact with  your medicine. What should I watch for while using this medication? Tell your care team if your symptoms do not get better or if they get worse. Visit your care team for regular checks on your progress. Because it may take several weeks to see the full effects of this medication, it is important to continue your treatment as prescribed by your care team. Watch for new or worsening thoughts of suicide or depression. This includes sudden changes in mood, behaviors, or thoughts. These changes can happen at any time but are more common in the beginning of treatment or after a change in dose. Call your care team right away if you experience these thoughts or worsening depression. Manic episodes may happen in patients with bipolar disorder who take this medication. Watch for changes in feelings or behaviors such as feeling anxious, nervous, agitated, panicky, irritable, hostile, aggressive, impulsive, severely restless, overly excited and hyperactive, or trouble sleeping. These changes can happen at any time but are more common in the beginning of treatment or after a change in dose. Call your care team right away if you notice any of these symptoms. You may get drowsy or dizzy. Do not drive, use machinery, or do anything that needs mental alertness until you know how this medication affects you. Do not stand or sit up quickly, especially if you are an older patient. This reduces the risk of dizzy or fainting spells. Alcohol may interfere with the effect of this medication. Avoid alcoholic drinks. This medication may cause dry eyes and blurred vision. If you wear contact lenses you may feel some discomfort. Lubricating drops may help. See your eye doctor if the problem does not go away or is severe. Your mouth may get dry. Chewing sugarless gum, sucking hard candy and drinking plenty of water may help. Contact your care team if the problem does not go away or is severe. What side effects may I notice from  receiving this medication? Side effects that you should report to your care team as soon as possible: Allergic reactions--skin rash, itching, hives, swelling of the face, lips, tongue, or throat Bleeding--bloody or black, tar-like stools, red or dark brown urine, vomiting blood or brown material that looks like coffee grounds, small, red or purple spots on skin, unusual bleeding or bruising Heart rhythm changes--fast or irregular heartbeat, dizziness, feeling faint or lightheaded, chest pain, trouble breathing Low blood pressure--dizziness, feeling faint or lightheaded, blurry vision Low sodium level--muscle weakness, fatigue, dizziness, headache, confusion Prolonged or painful erection Serotonin syndrome--irritability, confusion, fast or irregular heartbeat, muscle stiffness, twitching muscles, sweating, high fever, seizures, chills, vomiting, diarrhea Sudden eye pain or change in vision such as blurry vision, seeing halos around lights, vision loss Thoughts of suicide or self-harm, worsening mood, feelings of depression Side effects that usually do not require medical attention (report to your care team if they continue or are bothersome): Change in sex drive or performance Constipation Dizziness Drowsiness Dry mouth This list may not describe all possible side effects. Call your doctor for medical advice about side effects. You may report side effects to FDA at 1-800-FDA-1088. Where should I keep my medication? Keep out of the reach of children and pets. Store at room temperature between 15 and 30 degrees C (59 to 86 degrees F). Protect from light. Keep container tightly closed. Throw away any unused medication after the expiration date. NOTE: This sheet is a summary. It may not cover all possible information. If you have questions about this  medicine, talk to your doctor, pharmacist, or health care provider.  2023 Elsevier/Gold Standard (2020-09-15 00:00:00)
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
## 2022-10-23 ENCOUNTER — Ambulatory Visit: Payer: Medicare Other | Admitting: Psychiatry

## 2023-01-11 ENCOUNTER — Inpatient Hospital Stay: Payer: Medicare Other | Attending: Oncology | Admitting: Oncology

## 2023-01-11 ENCOUNTER — Inpatient Hospital Stay: Payer: Medicare Other

## 2023-01-11 ENCOUNTER — Encounter: Payer: Self-pay | Admitting: Oncology

## 2023-01-11 VITALS — BP 113/71 | HR 84 | Temp 96.5°F | Resp 16 | Ht 65.0 in | Wt 158.8 lb

## 2023-01-11 DIAGNOSIS — I10 Essential (primary) hypertension: Secondary | ICD-10-CM | POA: Diagnosis not present

## 2023-01-11 DIAGNOSIS — R718 Other abnormality of red blood cells: Secondary | ICD-10-CM | POA: Insufficient documentation

## 2023-01-11 DIAGNOSIS — Z90710 Acquired absence of both cervix and uterus: Secondary | ICD-10-CM | POA: Diagnosis not present

## 2023-01-11 DIAGNOSIS — F17290 Nicotine dependence, other tobacco product, uncomplicated: Secondary | ICD-10-CM | POA: Diagnosis not present

## 2023-01-11 DIAGNOSIS — D751 Secondary polycythemia: Secondary | ICD-10-CM

## 2023-01-11 DIAGNOSIS — Z6826 Body mass index (BMI) 26.0-26.9, adult: Secondary | ICD-10-CM | POA: Insufficient documentation

## 2023-01-11 DIAGNOSIS — Z862 Personal history of diseases of the blood and blood-forming organs and certain disorders involving the immune mechanism: Secondary | ICD-10-CM | POA: Insufficient documentation

## 2023-01-11 LAB — CBC (CANCER CENTER ONLY)
HCT: 45.2 % (ref 36.0–46.0)
Hemoglobin: 14.9 g/dL (ref 12.0–15.0)
MCH: 30.8 pg (ref 26.0–34.0)
MCHC: 33 g/dL (ref 30.0–36.0)
MCV: 93.6 fL (ref 80.0–100.0)
Platelet Count: 166 10*3/uL (ref 150–400)
RBC: 4.83 MIL/uL (ref 3.87–5.11)
RDW: 14 % (ref 11.5–15.5)
WBC Count: 6 10*3/uL (ref 4.0–10.5)
nRBC: 0 % (ref 0.0–0.2)

## 2023-01-11 LAB — IRON AND TIBC
Iron: 77 ug/dL (ref 28–170)
Saturation Ratios: 21 % (ref 10.4–31.8)
TIBC: 360 ug/dL (ref 250–450)
UIBC: 283 ug/dL

## 2023-01-11 LAB — FERRITIN: Ferritin: 131 ng/mL (ref 11–307)

## 2023-01-11 NOTE — Progress Notes (Signed)
[place]  [phone] Fax:(336) [phone]  ID: Teresa Lang OB: 1955/11/16  MR#: [id number]  [id number]  Patient Care Team: Marinda Elk, MD as PCP - General (Physician Assistant)  CHIEF COMPLAINT: Polycythemia.  INTERVAL HISTORY: Patient is a 67 year old female who was noted to have an increased red blood cell count on routine blood work.  She is anxious, but otherwise feels well.  She has no neurologic complaints.  She denies any recent fevers or illnesses.  She has a good appetite and denies weight loss.  She has no chest pain, shortness of breath, cough, or hemoptysis.  She denies any nausea, vomiting, constipation, or diarrhea.  She has no urinary complaints.  Patient offers no further specific complaints today.  REVIEW OF SYSTEMS:   Review of Systems  Constitutional: Negative.  Negative for fever, malaise/fatigue and weight loss.  Respiratory: Negative.  Negative for cough, hemoptysis and shortness of breath.   Cardiovascular: Negative.  Negative for chest pain and leg swelling.  Gastrointestinal: Negative.  Negative for abdominal pain.  Genitourinary: Negative.  Negative for dysuria.  Musculoskeletal: Negative.  Negative for back pain.  Skin: Negative.  Negative for rash.  Neurological: Negative.  Negative for dizziness, focal weakness, weakness and headaches.  Psychiatric/Behavioral:  The patient is nervous/anxious.     As per HPI. Otherwise, a complete review of systems is negative.  PAST MEDICAL HISTORY: Past Medical History:  Diagnosis Date   Allergy    seasonal   Anxiety    Arthritis    Asthma    COPD (chronic obstructive pulmonary disease)    Depression    Dyspnea    GERD (gastroesophageal reflux disease)    Hyperlipidemia    Hypertension     PAST SURGICAL HISTORY: Past Surgical History:  Procedure Laterality Date   ABDOMINAL HYSTERECTOMY  1983   CERVICAL [place]    COLONOSCOPY WITH PROPOFOL N/A 08/08/2022   Procedure: COLONOSCOPY WITH PROPOFOL;  Surgeon: Lesly Rubenstein, MD;  Location: [facility] ENDOSCOPY;  Service: Endoscopy;  Laterality: N/A;   ESOPHAGOGASTRODUODENOSCOPY N/A 08/08/2022   Procedure: ESOPHAGOGASTRODUODENOSCOPY (EGD);  Surgeon: Lesly Rubenstein, MD;  Location: [facility] ENDOSCOPY;  Service: Endoscopy;  Laterality: N/A;   HIP ARTHROPLASTY Right 03/20/2021   Procedure: ARTHROPLASTY BIPOLAR HIP (HEMIARTHROPLASTY);  Surgeon: Corky Mull, MD;  Location: [facility] ORS;  Service: Orthopedics;  Laterality: Right;   LUMBAR LAMINECTOMY/DECOMPRESSION MICRODISCECTOMY Right 11/24/2019   Procedure: OPEN L3/4 LAMINECTOMY WITH CYST REMOVAL;  Surgeon: Deetta Perla, MD;  Location: [facility] ORS;  Service: Neurosurgery;  Laterality: Right;   SPINE SURGERY  beginning 1994   laminectomies, fusions    FAMILY HISTORY: Family History  Problem Relation Age of Onset   Alcohol abuse Father    Celiac disease Sister    Drug abuse Maternal Aunt    Suicidality Maternal Aunt    Alcohol abuse Paternal Grandfather    Bipolar disorder Daughter    Drug abuse Son    Breast cancer Neg Hx     ADVANCED DIRECTIVES (Y/N):  N  HEALTH MAINTENANCE: Social History   Tobacco Use   Smoking status: Some Days    Types: E-cigarettes    Passive exposure: Past   Smokeless tobacco: Never   Tobacco comments:    Also VAPES on a daily basis  Vaping Use   Vaping Use: Every day   Substances: Nicotine  Substance Use Topics   Alcohol use: Not Currently    Comment: heavy abuse -  6 pack per day in the past   Drug use: Not Currently     Colonoscopy:  PAP:  Bone density:  Lipid panel:  Allergies  Allergen Reactions   Prednisone Anxiety    Current Outpatient Medications  Medication Sig Dispense Refill   albuterol (VENTOLIN HFA) 108 (90 Base) MCG/ACT inhaler Inhale 2 puffs into the lungs every 6 (six) hours as needed for wheezing or shortness of breath. 6.7 g 1   ARIPiprazole  (ABILIFY) 15 MG tablet Take 15 mg by mouth daily.     aspirin EC 81 MG tablet Take 81 mg by mouth.     aspirin-acetaminophen-caffeine (EXCEDRIN MIGRAINE) 250-250-65 MG tablet Take by mouth every 6 (six) hours as needed for headache.     atorvastatin (LIPITOR) 20 MG tablet Take 20 mg by mouth daily.     Baclofen 5 MG TABS TAKE ONE TABLET BY MOUTH DAILY AT 9AM EVERY MORNING and TAKE TWO TABLETS BY MOUTH DAILY AT 9PM AT BEDTIME     busPIRone (BUSPAR) 10 MG tablet Take 10 mg by mouth 3 (three) times daily.     diclofenac Sodium (VOLTAREN) 1 % GEL Apply 2 g topically 4 (four) times daily.     diltiazem (CARDIZEM CD) 240 MG 24 hr capsule Take 240 mg by mouth daily.     DULoxetine (CYMBALTA) 60 MG capsule Take 60 mg by mouth daily.     folic acid (FOLVITE) 1 MG tablet Take 1 tablet (1 mg total) by mouth daily. 30 tablet 1   hydrocortisone 2.5 % cream SMARTSIG:1 Topical Daily     hydroxychloroquine (PLAQUENIL) 200 MG tablet Take by mouth.     lisinopril (ZESTRIL) 20 MG tablet Take 1 tablet (20 mg total) by mouth daily. 30 tablet 1   methotrexate (RHEUMATREX) 2.5 MG tablet Take 17.5 mg by mouth every Sunday.     omeprazole (PRILOSEC) 40 MG capsule Take 40 mg by mouth daily as needed (acid reflux symptoms).     ondansetron (ZOFRAN) 4 MG tablet Take 1 tablet (4 mg total) by mouth every 6 (six) hours as needed for nausea. 30 tablet 0   oxybutynin (DITROPAN-XL) 10 MG 24 hr tablet Take 1 tablet (10 mg total) by mouth at bedtime. 90 tablet 1   solifenacin (VESICARE) 5 MG tablet Take 1 tablet (5 mg total) by mouth daily. 90 tablet 1   TRELEGY ELLIPTA 100-62.5-25 MCG/INH AEPB Inhale 2 puffs into the lungs daily.     traZODone (DESYREL) 100 MG tablet Take 0.5-1 tablets (50-100 mg total) by mouth at bedtime as needed for sleep. (Patient not taking: Reported on 01/11/2023) 30 tablet 1   No current facility-administered medications for this visit.    OBJECTIVE: Vitals:   01/11/23 0948  BP: 113/71  Pulse: 84   Resp: 16  Temp: (!) 96.5 F (35.8 C)  SpO2: 93%     Body mass index is 26.43 kg/m.    ECOG FS:0 - Asymptomatic  General: Well-developed, well-nourished, no acute distress. Eyes: Pink conjunctiva, anicteric sclera. HEENT: Normocephalic, moist mucous membranes. Lungs: No audible wheezing or coughing. Heart: Regular rate and rhythm. Abdomen: Soft, nontender, no obvious distention. Musculoskeletal: No edema, cyanosis, or clubbing. Neuro: Alert, answering all questions appropriately. Cranial nerves grossly intact. Skin: No rashes or petechiae noted. Psych: Normal affect. Lymphatics: No cervical, calvicular, axillary or inguinal LAD.   LAB RESULTS:  Lab Results  Component Value Date   NA 140 04/03/2022   K 3.8 04/03/2022   CL 106 04/03/2022  CO2 23 04/03/2022   GLUCOSE 123 (H) 04/03/2022   BUN 9 04/03/2022   CREATININE 0.62 04/03/2022   CALCIUM 10.4 (H) 04/03/2022   PROT 6.9 03/19/2021   ALBUMIN 3.8 03/19/2021   AST 28 03/19/2021   ALT 22 03/19/2021   ALKPHOS 91 03/19/2021   BILITOT 0.9 03/19/2021   GFRNONAA >60 04/03/2022   GFRAA 78 08/09/2020    Lab Results  Component Value Date   WBC 6.0 01/11/2023   NEUTROABS 9.5 (H) 03/19/2021   HGB 14.9 01/11/2023   HCT 45.2 01/11/2023   MCV 93.6 01/11/2023   PLT 166 01/11/2023     STUDIES: No results found.  ASSESSMENT: Polycythemia.  PLAN:    Polycythemia: Resolved.  Patient's hemoglobin is 14.9 today.  I have ordered carbon monoxide level, iron stores, and JAK2 mutation for completeness and these are pending at time of dictation.  No intervention is needed at this time.  Patient does not require treatment for phlebotomy.  Return to clinic in 3 weeks for further evaluation and discussion of her laboratory results.  I spent a total of 45 minutes reviewing chart data, face-to-face evaluation with the patient, counseling and coordination of care as detailed above.   Patient expressed understanding and was in  agreement with this plan. She also understands that She can call clinic at any time with any questions, concerns, or complaints.    Lloyd Huger, MD   01/11/2023 10:46 AM

## 2023-01-12 LAB — ERYTHROPOIETIN: Erythropoietin: 6.3 m[IU]/mL (ref 2.6–18.5)

## 2023-01-12 LAB — CARBON MONOXIDE, BLOOD (PERFORMED AT REF LAB): Carbon Monoxide, Blood: 2.8 % (ref 0.0–3.6)

## 2023-01-22 LAB — JAK2 V617F RFX CALR/MPL/E12-15

## 2023-01-22 LAB — CALR +MPL + E12-E15  (REFLEX)

## 2023-01-23 ENCOUNTER — Other Ambulatory Visit
Admission: RE | Admit: 2023-01-23 | Discharge: 2023-01-23 | Disposition: A | Discharge status: Home / Self Care | Payer: Medicare Other | Source: Ambulatory Visit | Attending: Physician Assistant | Admitting: Physician Assistant

## 2023-01-23 ENCOUNTER — Other Ambulatory Visit: Payer: Self-pay | Admitting: Physician Assistant

## 2023-01-23 ENCOUNTER — Ambulatory Visit
Admission: RE | Admit: 2023-01-23 | Discharge: 2023-01-23 | Disposition: A | Discharge status: Home / Self Care | Payer: Medicare Other | Source: Ambulatory Visit | Attending: Physician Assistant | Admitting: Physician Assistant

## 2023-01-23 DIAGNOSIS — R053 Chronic cough: Secondary | ICD-10-CM

## 2023-01-23 DIAGNOSIS — R0602 Shortness of breath: Secondary | ICD-10-CM | POA: Insufficient documentation

## 2023-01-23 LAB — D-DIMER, QUANTITATIVE: D-Dimer, Quant: 0.68 ug/mL-FEU — ABNORMAL HIGH (ref 0.00–0.50)

## 2023-01-23 MED ORDER — IOHEXOL 350 MG/ML SOLN
75.0000 mL | Freq: Once | INTRAVENOUS | Status: AC | PRN
  Administered 2023-01-23: 75 mL via INTRAVENOUS

## 2023-01-25 ENCOUNTER — Other Ambulatory Visit: Payer: Self-pay | Admitting: Physician Assistant

## 2023-01-25 DIAGNOSIS — R0602 Shortness of breath: Secondary | ICD-10-CM

## 2023-02-01 ENCOUNTER — Inpatient Hospital Stay: Payer: Medicare Other | Admitting: Oncology

## 2023-02-01 ENCOUNTER — Encounter: Payer: Self-pay | Admitting: Oncology

## 2023-02-01 VITALS — BP 129/63 | HR 72 | Temp 97.6°F | Resp 16 | Ht 65.0 in | Wt 159.0 lb

## 2023-02-01 DIAGNOSIS — D751 Secondary polycythemia: Secondary | ICD-10-CM | POA: Diagnosis not present

## 2023-02-01 DIAGNOSIS — R718 Other abnormality of red blood cells: Secondary | ICD-10-CM | POA: Diagnosis not present

## 2023-02-01 NOTE — Progress Notes (Signed)
[facility]  Telephone:(336) [phone] [phone]) [phone]  ID: Teresa Lang OB: 16-May-1956  MR#: [id number]  AOZ#:[id number]  Patient Care Team: Patrice Paradise, MD as PCP - General (Physician Assistant)  CHIEF COMPLAINT: Polycythemia, resolved.  INTERVAL HISTORY: Patient returns to clinic today for further evaluation and discussion of her laboratory results.  She continues to feel well and remains asymptomatic.  She has no neurologic complaints.  She denies any recent fevers or illnesses.  She has a good appetite and denies weight loss.  She has no chest pain, shortness of breath, cough, or hemoptysis.  She denies any nausea, vomiting, constipation, or diarrhea.  She has no urinary complaints.  Patient offers no specific complaints today.  REVIEW OF SYSTEMS:   Review of Systems  Constitutional: Negative.  Negative for fever, malaise/fatigue and weight loss.  Respiratory: Negative.  Negative for cough, hemoptysis and shortness of breath.   Cardiovascular: Negative.  Negative for chest pain and leg swelling.  Gastrointestinal: Negative.  Negative for abdominal pain.  Genitourinary: Negative.  Negative for dysuria.  Musculoskeletal: Negative.  Negative for back pain.  Skin: Negative.  Negative for rash.  Neurological: Negative.  Negative for dizziness, focal weakness, weakness and headaches.  Psychiatric/Behavioral: Negative.  The patient is not nervous/anxious.     As per HPI. Otherwise, a complete review of systems is negative.  PAST MEDICAL HISTORY: Past Medical History:  Diagnosis Date   Allergy    seasonal   Anxiety    Arthritis    Asthma    COPD (chronic obstructive pulmonary disease)    Depression    Dyspnea    GERD (gastroesophageal reflux disease)    Hyperlipidemia    Hypertension     PAST SURGICAL HISTORY: Past Surgical History:  Procedure Laterality Date   ABDOMINAL HYSTERECTOMY  1983   CERVICAL [place] SURGERY      CHOLECYSTECTOMY  1983   COLONOSCOPY WITH PROPOFOL N/A 08/08/2022   Procedure: COLONOSCOPY WITH PROPOFOL;  Surgeon: Regis Bill, MD;  Location: [facility] ENDOSCOPY;  Service: Endoscopy;  Laterality: N/A;   ESOPHAGOGASTRODUODENOSCOPY N/A 08/08/2022   Procedure: ESOPHAGOGASTRODUODENOSCOPY (EGD);  Surgeon: Regis Bill, MD;  [facility] ENDOSCOPY;  Service: Endoscopy;  Laterality: N/A;   HIP ARTHROPLASTY Right 03/20/2021   Procedure: ARTHROPLASTY BIPOLAR HIP (HEMIARTHROPLASTY);  Surgeon: Christena Flake, MD;  Location: [facility] ORS;  Service: Orthopedics;  Laterality: Right;   LUMBAR LAMINECTOMY/DECOMPRESSION MICRODISCECTOMY Right 11/24/2019   Procedure: OPEN L3/4 LAMINECTOMY WITH CYST REMOVAL;  Surgeon: Lucy Chris, MD;  Location: [facility] ORS;  Service: Neurosurgery;  Laterality: Right;   SPINE SURGERY  beginning 1994   laminectomies, fusions    FAMILY HISTORY: Family History  Problem Relation Age of Onset   Alcohol abuse Father    Celiac disease Sister    Drug abuse Maternal Aunt    Suicidality Maternal Aunt    Alcohol abuse Paternal Grandfather    Bipolar disorder Daughter    Drug abuse Son    Breast cancer Neg Hx     ADVANCED DIRECTIVES (Y/N):  N  HEALTH MAINTENANCE: Social History   Tobacco Use   Smoking status: Some Days    Types: E-cigarettes    Passive exposure: Past   Smokeless tobacco: Never   Tobacco comments:    Also VAPES on a daily basis  Vaping Use   Vaping Use: Every day   Substances: Nicotine  Substance Use Topics   Alcohol use: Not Currently    Comment: heavy abuse - 6 pack  per day in the past   Drug use: Not Currently     Colonoscopy:  PAP:  Bone density:  Lipid panel:  Allergies  Allergen Reactions   Prednisone Anxiety    Current Outpatient Medications  Medication Sig Dispense Refill   albuterol (VENTOLIN HFA) 108 (90 Base) MCG/ACT inhaler Inhale 2 puffs into the lungs every 6 (six) hours as needed for wheezing or shortness of breath. 6.7  g 1   ARIPiprazole (ABILIFY) 15 MG tablet Take 15 mg by mouth daily.     aspirin EC 81 MG tablet Take 81 mg by mouth.     aspirin-acetaminophen-caffeine (EXCEDRIN MIGRAINE) 250-250-65 MG tablet Take by mouth every 6 (six) hours as needed for headache.     atorvastatin (LIPITOR) 20 MG tablet Take 20 mg by mouth daily.     Baclofen 5 MG TABS TAKE ONE TABLET BY MOUTH DAILY AT 9AM EVERY MORNING and TAKE TWO TABLETS BY MOUTH DAILY AT 9PM AT BEDTIME     busPIRone (BUSPAR) 10 MG tablet Take 10 mg by mouth 3 (three) times daily.     diclofenac Sodium (VOLTAREN) 1 % GEL Apply 2 g topically 4 (four) times daily.     diltiazem (CARDIZEM CD) 240 MG 24 hr capsule Take 240 mg by mouth daily.     DULoxetine (CYMBALTA) 60 MG capsule Take 60 mg by mouth daily.     folic acid (FOLVITE) 1 MG tablet Take 1 tablet (1 mg total) by mouth daily. 30 tablet 1   hydrocortisone 2.5 % cream SMARTSIG:1 Topical Daily     hydroxychloroquine (PLAQUENIL) 200 MG tablet Take by mouth.     lisinopril (ZESTRIL) 20 MG tablet Take 1 tablet (20 mg total) by mouth daily. 30 tablet 1   methotrexate (RHEUMATREX) 2.5 MG tablet Take 17.5 mg by mouth every Sunday.     omeprazole (PRILOSEC) 40 MG capsule Take 40 mg by mouth daily as needed (acid reflux symptoms).     ondansetron (ZOFRAN) 4 MG tablet Take 1 tablet (4 mg total) by mouth every 6 (six) hours as needed for nausea. 30 tablet 0   oxybutynin (DITROPAN-XL) 10 MG 24 hr tablet Take 1 tablet (10 mg total) by mouth at bedtime. 90 tablet 1   OXYGEN Inhale into the lungs. PRN     solifenacin (VESICARE) 5 MG tablet Take 1 tablet (5 mg total) by mouth daily. 90 tablet 1   TRELEGY ELLIPTA 100-62.5-25 MCG/INH AEPB Inhale 2 puffs into the lungs daily.     traZODone (DESYREL) 100 MG tablet Take 0.5-1 tablets (50-100 mg total) by mouth at bedtime as needed for sleep. (Patient not taking: Reported on 01/11/2023) 30 tablet 1   No current facility-administered medications for this visit.     OBJECTIVE: Vitals:   02/01/23 1031  BP: 129/63  Pulse: 72  Resp: 16  Temp: 97.6 F (36.4 C)  SpO2: 95%     Body mass index is 26.46 kg/m.    ECOG FS:0 - Asymptomatic  General: Well-developed, well-nourished, no acute distress. Eyes: Pink conjunctiva, anicteric sclera. HEENT: Normocephalic, moist mucous membranes. Lungs: No audible wheezing or coughing. Heart: Regular rate and rhythm. Abdomen: Soft, nontender, no obvious distention. Musculoskeletal: No edema, cyanosis, or clubbing. Neuro: Alert, answering all questions appropriately. Cranial nerves grossly intact. Skin: No rashes or petechiae noted. Psych: Normal affect.  LAB RESULTS:  Lab Results  Component Value Date   NA 140 04/03/2022   K 3.8 04/03/2022   CL 106 04/03/2022  CO2 23 04/03/2022   GLUCOSE 123 (H) 04/03/2022   BUN 9 04/03/2022   CREATININE 0.62 04/03/2022   CALCIUM 10.4 (H) 04/03/2022   PROT 6.9 03/19/2021   ALBUMIN 3.8 03/19/2021   AST 28 03/19/2021   ALT 22 03/19/2021   ALKPHOS 91 03/19/2021   BILITOT 0.9 03/19/2021   GFRNONAA >60 04/03/2022   GFRAA 78 08/09/2020    Lab Results  Component Value Date   WBC 6.0 01/11/2023   NEUTROABS 9.5 (H) 03/19/2021   HGB 14.9 01/11/2023   HCT 45.2 01/11/2023   MCV 93.6 01/11/2023   PLT 166 01/11/2023     STUDIES: CT Angio Chest Pulmonary Embolism (PE) W or WO Contrast  Result Date: 01/23/2023 CLINICAL DATA:  Shortness of breath EXAM: CT ANGIOGRAPHY CHEST WITH CONTRAST TECHNIQUE: Multidetector CT imaging of the chest was performed using the standard protocol during bolus administration of intravenous contrast. Multiplanar CT image reconstructions and MIPs were obtained to evaluate the vascular anatomy. RADIATION DOSE REDUCTION: This exam was performed according to the departmental dose-optimization program which includes automated exposure control, adjustment of the mA and/or kV according to patient size and/or use of iterative reconstruction  technique. CONTRAST:  75mL OMNIPAQUE IOHEXOL 350 MG/ML SOLN COMPARISON:  04/03/2022 FINDINGS: Cardiovascular: Satisfactory opacification of the pulmonary arteries to the segmental level. No evidence of pulmonary embolism. Thoracic aorta is nonaneurysmal. Scattered atherosclerotic vascular calcifications of the aorta and coronary arteries. Normal heart size. Small pericardial effusion, similar to prior. Mediastinum/Nodes: No enlarged mediastinal, hilar, or axillary lymph nodes. Thyroid gland, trachea, and esophagus demonstrate no significant findings. Lungs/Pleura: Mild emphysematous lung changes. No airspace consolidation. No pleural effusion or pneumothorax. Upper Abdomen: No acute abnormality. Musculoskeletal: Multilevel thoracic spondylosis. No acute bony abnormality. No chest wall abnormality. Review of the MIP images confirms the above findings. IMPRESSION: 1. No evidence of pulmonary embolism or other acute intrathoracic findings. 2. Small pericardial effusion, similar to prior. 3. Mild emphysema (ICD10-J43.9). 4. Aortic and coronary artery atherosclerosis (ICD10-I70.0) . Electronically Signed   By: Duanne Guess D.O.   On: 01/23/2023 15:04    ASSESSMENT: Polycythemia, resolved.  PLAN:    Polycythemia: Resolved.  Patient's most recent hemoglobin is within normal limits at 14.9.  All of her other laboratory work including carbon monoxide level, iron stores, and JAK2 mutation are either negative or within normal limits.  No intervention is needed at this time.  Patient does not require treatment for phlebotomy.  No further follow-up has been scheduled.  Please refer patient back if there are any questions or concerns.  I spent a total of 20 minutes reviewing chart data, face-to-face evaluation with the patient, counseling and coordination of care as detailed above.   Patient expressed understanding and was in agreement with this plan. She also understands that She can call clinic at any time with  any questions, concerns, or complaints.    Jeralyn Ruths, MD   02/01/2023 11:02 AM

## 2023-02-08 ENCOUNTER — Other Ambulatory Visit: Payer: Self-pay | Admitting: *Deleted

## 2023-02-08 DIAGNOSIS — N3946 Mixed incontinence: Secondary | ICD-10-CM

## 2023-02-08 MED ORDER — SOLIFENACIN SUCCINATE 5 MG PO TABS
5.0000 mg | ORAL_TABLET | Freq: Every day | ORAL | 3 refills | Status: DC
Start: 2023-02-08 — End: 2023-05-14

## 2023-02-08 MED ORDER — OXYBUTYNIN CHLORIDE ER 10 MG PO TB24
10.0000 mg | ORAL_TABLET | Freq: Every day | ORAL | 3 refills | Status: DC
Start: 2023-02-08 — End: 2023-05-14

## 2023-05-14 ENCOUNTER — Ambulatory Visit: Payer: Medicare Other | Admitting: Urology

## 2023-05-14 VITALS — BP 126/73 | HR 81 | Ht 65.0 in | Wt 159.0 lb

## 2023-05-14 DIAGNOSIS — N3946 Mixed incontinence: Secondary | ICD-10-CM

## 2023-05-14 LAB — MICROSCOPIC EXAMINATION

## 2023-05-14 LAB — URINALYSIS, COMPLETE
Bilirubin, UA: NEGATIVE
Glucose, UA: NEGATIVE
Ketones, UA: NEGATIVE
Leukocytes,UA: NEGATIVE
Nitrite, UA: NEGATIVE
Protein,UA: NEGATIVE
RBC, UA: NEGATIVE
Specific Gravity, UA: <1.005 — ABNORMAL LOW (ref 1.005–1.030)
Urobilinogen, Ur: 1 mg/dL (ref 0.2–1.0)
pH, UA: 6.5 (ref 5.0–7.5)

## 2023-05-14 MED ORDER — OXYBUTYNIN CHLORIDE ER 10 MG PO TB24
10.0000 mg | ORAL_TABLET | Freq: Every day | ORAL | 3 refills | Status: DC
Start: 2023-05-14 — End: 2024-05-12

## 2023-05-14 MED ORDER — SOLIFENACIN SUCCINATE 5 MG PO TABS
5.0000 mg | ORAL_TABLET | Freq: Every day | ORAL | 3 refills | Status: DC
Start: 2023-05-14 — End: 2024-05-12

## 2023-05-14 NOTE — Progress Notes (Signed)
05/14/2023 8:17 AM   [name] 1956/03/31 [id number]  Referring provider: Patrice Paradise, MD [address] [facility] [address] [facility] [place],  [place] [address]  Chief Complaint  Patient presents with   Follow-up    HPI: was consulted to assess the patient is urinary incontinence.  She does not leak with coughing sneezing bending or lifting.  She has urge incontinence.  I think she is small volume bedwetting.  She wears 5 pads a day moderately wet or soaked.   She gets up 4-5 times at night.  She can hold urination for many hours and some days she does not void at all.  She does feel like she needs to urinate first thing in the morning   For 2 months her urine is dark with an odor.   She has had 5 neck operations from previous accident and a hysterectomy is prone to constipation.  Has a smoking history   Patient has urge incontinence.  She voids very infrequently.  She has significant nocturia.  Her urine looked infected today.  She had microscopic hematuria.  The patient is a bit vague and spoke about dark urine.  Hopefully she just has a bladder infection but I I will do a hematuria work-up.     Urine culture positive.  CT scan negative cystoscopy normal   Patient said dark urine went away but she still has urgency incontinence   Reassess in 6 weeks on Myrbetriq 50 mg samples and prescription.  Call if urine culture is positive.  If urine culture positive consider prophylaxis.   Patient switched to oxybutynin 10 mg. Patient 50% improved.  Less frequency.  Less urge incontinence.  Still wearing 3-4 pads a day with variable amount of leakage.  Clinically not infected   On urodynamics she voided less than 20 mL and was catheterized for 300 mL.  Patient's maximum bladder capacity was 513 mL.  Bladder was unstable reaching a pressure of 7 cm of water.  She did not feel the low pressure contraction.  She then had an unstable bladder contraction 26 cm of water and  leaked with urgency office contraction.  She had no stress incontinence with a Valsalva pressure 149 cm of water.  During voluntary voiding she voided 491 mL.  Maximum flow was 26 mils per second.  Maximum voiding pressure 20 cm of water.  Residual was 30 mL.  EMG activity was noted during voiding.  Bladder neck descended 2 cm.  she had a normal flow curve during the voluntary void she had mild bladder trabeculation.     Patient primarily has an overactive bladder.  Like to try 1 more medication.  If this fails I will discussed 3 refractory therapies with her.  She continues to be moderately improved on the oxybutynin so may combine this with Vesicare 5 mg.  Discussed 3 refractory therapies next visit depending on how she does.  If she does really well I can always stop the oxybutynin   Today Frequency stable.  Dramatic improvement in continence.  She was smiling because she is so happy.  No infections.  Tolerating both medications well   Today When patient stopped her Vesicare and she started leaking again.  Both medications together worked Agricultural consultant.     Today Urgent continence dramatically better.  No infections.  Frequency stable.   Oxybutynin Vesicare 90x3 sent to pharmacy and I will see in 1 year  Today Patient was nearly completely continent about a week ago.  Starting to have urge incontinence this week.  Urine may be a little bit darker but no dysuria or other cystitis symptoms.  She is now on home oxygen for COPD   PMH: Past Medical History:  Diagnosis Date   Allergy    seasonal   Anxiety    Arthritis    Asthma    COPD (chronic obstructive pulmonary disease) (HCC)    Depression    Dyspnea    GERD (gastroesophageal reflux disease)    Hyperlipidemia    Hypertension     Surgical History: Past Surgical History:  Procedure Laterality Date   ABDOMINAL HYSTERECTOMY  1983   CERVICAL DISC SURGERY     CHOLECYSTECTOMY  1983   COLONOSCOPY WITH PROPOFOL N/A 08/08/2022    Procedure: COLONOSCOPY WITH PROPOFOL;  Surgeon: Regis Bill, MD;  Location: [facility] ENDOSCOPY;  Service: Endoscopy;  Laterality: N/A;   ESOPHAGOGASTRODUODENOSCOPY N/A 08/08/2022   Procedure: ESOPHAGOGASTRODUODENOSCOPY (EGD);  Surgeon: Regis Bill, MD;  Location: [facility] ENDOSCOPY;  Service: Endoscopy;  Laterality: N/A;   HIP ARTHROPLASTY Right 03/20/2021   Procedure: ARTHROPLASTY BIPOLAR HIP (HEMIARTHROPLASTY);  Surgeon: Christena Flake, MD;  Location: [facility] ORS;  Service: Orthopedics;  Laterality: Right;   LUMBAR LAMINECTOMY/DECOMPRESSION MICRODISCECTOMY Right 11/24/2019   Procedure: OPEN L3/4 LAMINECTOMY WITH CYST REMOVAL;  Surgeon: Lucy Chris, MD;  Location: [facility] ORS;  Service: Neurosurgery;  Laterality: Right;   SPINE SURGERY  beginning 1994   laminectomies, fusions    Home Medications:  Allergies as of 05/14/2023       Reactions   Prednisone Anxiety        Medication List        Accurate as of May 14, 2023  8:17 AM. If you have any questions, ask your nurse or doctor.          albuterol 108 (90 Base) MCG/ACT inhaler Commonly known as: VENTOLIN HFA Inhale 2 puffs into the lungs every 6 (six) hours as needed for wheezing or shortness of breath.   ARIPiprazole 15 MG tablet Commonly known as: ABILIFY Take 15 mg by mouth daily.   aspirin EC 81 MG tablet Take 81 mg by mouth.   aspirin-acetaminophen-caffeine 250-250-65 MG tablet Commonly known as: EXCEDRIN MIGRAINE Take by mouth every 6 (six) hours as needed for headache.   atorvastatin 20 MG tablet Commonly known as: LIPITOR Take 20 mg by mouth daily.   Baclofen 5 MG Tabs TAKE ONE TABLET BY MOUTH DAILY AT 9AM EVERY MORNING and TAKE TWO TABLETS BY MOUTH DAILY AT 9PM AT BEDTIME   busPIRone 10 MG tablet Commonly known as: BUSPAR Take 10 mg by mouth 3 (three) times daily.   diclofenac Sodium 1 % Gel Commonly known as: VOLTAREN Apply 2 g topically 4 (four) times daily.   diltiazem 240 MG 24 hr  capsule Commonly known as: CARDIZEM CD Take 240 mg by mouth daily.   DULoxetine 60 MG capsule Commonly known as: CYMBALTA Take 60 mg by mouth daily.   folic acid 1 MG tablet Commonly known as: FOLVITE Take 1 tablet (1 mg total) by mouth daily.   hydrocortisone 2.5 % cream SMARTSIG:1 Topical Daily   hydroxychloroquine 200 MG tablet Commonly known as: PLAQUENIL Take by mouth.   lisinopril 20 MG tablet Commonly known as: ZESTRIL Take 1 tablet (20 mg total) by mouth daily.   methotrexate 2.5 MG tablet Commonly known as: RHEUMATREX Take 17.5 mg by mouth every Sunday.   omeprazole 40 MG capsule Commonly known as: PRILOSEC Take 40 mg  by mouth daily as needed (acid reflux symptoms).   ondansetron 4 MG tablet Commonly known as: ZOFRAN Take 1 tablet (4 mg total) by mouth every 6 (six) hours as needed for nausea.   oxybutynin 10 MG 24 hr tablet Commonly known as: DITROPAN-XL Take 1 tablet (10 mg total) by mouth at bedtime.   OXYGEN Inhale into the lungs. PRN   solifenacin 5 MG tablet Commonly known as: VESICARE Take 1 tablet (5 mg total) by mouth daily.   traZODone 100 MG tablet Commonly known as: DESYREL Take 0.5-1 tablets (50-100 mg total) by mouth at bedtime as needed for sleep.   Trelegy Ellipta 100-62.5-25 MCG/INH Aepb Generic drug: Fluticasone-Umeclidin-Vilant Inhale 2 puffs into the lungs daily.        Allergies:  Allergies  Allergen Reactions   Prednisone Anxiety    Family History: Family History  Problem Relation Age of Onset   Alcohol abuse Father    Celiac disease Sister    Drug abuse Maternal Aunt    Suicidality Maternal Aunt    Alcohol abuse Paternal Grandfather    Bipolar disorder Daughter    Drug abuse Son    Breast cancer Neg Hx     Social History:  reports that she has been smoking e-cigarettes. She has been exposed to tobacco smoke. She has never used smokeless tobacco. She reports that she does not currently use alcohol. She reports  that she does not currently use drugs.  ROS:                                        Physical Exam: There were no vitals taken for this visit.  Constitutional:  Alert and oriented, No acute distress. HEENT: [place] AT, moist mucus membranes.  Trachea midline, no masses.   Laboratory Data: Lab Results  Component Value Date   WBC 6.0 01/11/2023   HGB 14.9 01/11/2023   HCT 45.2 01/11/2023   MCV 93.6 01/11/2023   PLT 166 01/11/2023    Lab Results  Component Value Date   CREATININE 0.62 04/03/2022    No results found for: "PSA"  No results found for: "TESTOSTERONE"  Lab Results  Component Value Date   HGBA1C 5.5 03/21/2019    Urinalysis    Component Value Date/Time   COLORURINE AMBER (A) 11/20/2019 0804   APPEARANCEUR Clear 05/15/2022 0824   LABSPEC 1.016 11/20/2019 0804   PHURINE 7.0 11/20/2019 0804   GLUCOSEU Negative 05/15/2022 0824   HGBUR NEGATIVE 11/20/2019 0804   BILIRUBINUR Negative 05/15/2022 0824   KETONESUR NEGATIVE 11/20/2019 0804   PROTEINUR Negative 05/15/2022 0824   PROTEINUR 30 (A) 11/20/2019 0804   NITRITE Negative 05/15/2022 0824   NITRITE NEGATIVE 11/20/2019 0804   LEUKOCYTESUR Trace (A) 05/15/2022 0824   LEUKOCYTESUR NEGATIVE 11/20/2019 0804    Pertinent Imaging: Urine reviewed and sent for culture  Assessment & Plan: I thought it was best to renew both prescriptions for now since she did really well over the last year.  She has home oxygen and medical comorbidities.  If things or not improving in the next few months I urged her to come back in for reassessment.  Call if culture positive.  I do not think we should be talk about refractory treatments based upon the 1 week history noted above  1. Mixed incontinence  - Urinalysis, Complete   No follow-ups on file.  Martina Sinner, MD  [facility] [address], [address] [place], [place] [address] [phone]

## 2023-11-29 ENCOUNTER — Observation Stay (HOSPITAL_COMMUNITY): Payer: Medicare Other

## 2023-11-29 ENCOUNTER — Inpatient Hospital Stay (HOSPITAL_COMMUNITY)
Admission: EM | Admit: 2023-11-29 | Discharge: 2023-12-01 | DRG: 093 | Disposition: A | Discharge status: Home Health | Payer: Medicare Other | Attending: Internal Medicine | Admitting: Internal Medicine

## 2023-11-29 ENCOUNTER — Other Ambulatory Visit: Payer: Self-pay

## 2023-11-29 ENCOUNTER — Encounter (HOSPITAL_COMMUNITY): Payer: Self-pay | Admitting: Family Medicine

## 2023-11-29 ENCOUNTER — Emergency Department (HOSPITAL_COMMUNITY): Payer: Medicare Other

## 2023-11-29 DIAGNOSIS — Z79899 Other long term (current) drug therapy: Secondary | ICD-10-CM

## 2023-11-29 DIAGNOSIS — G9340 Encephalopathy, unspecified: Secondary | ICD-10-CM | POA: Diagnosis present

## 2023-11-29 DIAGNOSIS — E785 Hyperlipidemia, unspecified: Secondary | ICD-10-CM | POA: Diagnosis present

## 2023-11-29 DIAGNOSIS — M0579 Rheumatoid arthritis with rheumatoid factor of multiple sites without organ or systems involvement: Secondary | ICD-10-CM | POA: Diagnosis present

## 2023-11-29 DIAGNOSIS — F17290 Nicotine dependence, other tobacco product, uncomplicated: Secondary | ICD-10-CM | POA: Diagnosis present

## 2023-11-29 DIAGNOSIS — R1110 Vomiting, unspecified: Secondary | ICD-10-CM

## 2023-11-29 DIAGNOSIS — J4489 Other specified chronic obstructive pulmonary disease: Secondary | ICD-10-CM | POA: Diagnosis present

## 2023-11-29 DIAGNOSIS — M069 Rheumatoid arthritis, unspecified: Secondary | ICD-10-CM | POA: Diagnosis not present

## 2023-11-29 DIAGNOSIS — Z9049 Acquired absence of other specified parts of digestive tract: Secondary | ICD-10-CM

## 2023-11-29 DIAGNOSIS — M797 Fibromyalgia: Secondary | ICD-10-CM | POA: Diagnosis present

## 2023-11-29 DIAGNOSIS — I10 Essential (primary) hypertension: Secondary | ICD-10-CM | POA: Diagnosis present

## 2023-11-29 DIAGNOSIS — R112 Nausea with vomiting, unspecified: Secondary | ICD-10-CM | POA: Diagnosis present

## 2023-11-29 DIAGNOSIS — Z7983 Long term (current) use of bisphosphonates: Secondary | ICD-10-CM

## 2023-11-29 DIAGNOSIS — J441 Chronic obstructive pulmonary disease with (acute) exacerbation: Secondary | ICD-10-CM | POA: Diagnosis present

## 2023-11-29 DIAGNOSIS — Z96641 Presence of right artificial hip joint: Secondary | ICD-10-CM | POA: Diagnosis present

## 2023-11-29 DIAGNOSIS — G894 Chronic pain syndrome: Secondary | ICD-10-CM | POA: Diagnosis present

## 2023-11-29 DIAGNOSIS — K219 Gastro-esophageal reflux disease without esophagitis: Secondary | ICD-10-CM | POA: Diagnosis present

## 2023-11-29 DIAGNOSIS — J449 Chronic obstructive pulmonary disease, unspecified: Secondary | ICD-10-CM | POA: Diagnosis present

## 2023-11-29 DIAGNOSIS — Z888 Allergy status to other drugs, medicaments and biological substances status: Secondary | ICD-10-CM

## 2023-11-29 DIAGNOSIS — Z7982 Long term (current) use of aspirin: Secondary | ICD-10-CM

## 2023-11-29 DIAGNOSIS — F1121 Opioid dependence, in remission: Secondary | ICD-10-CM | POA: Diagnosis present

## 2023-11-29 DIAGNOSIS — G929 Unspecified toxic encephalopathy: Secondary | ICD-10-CM | POA: Diagnosis not present

## 2023-11-29 DIAGNOSIS — R410 Disorientation, unspecified: Principal | ICD-10-CM

## 2023-11-29 DIAGNOSIS — F1290 Cannabis use, unspecified, uncomplicated: Secondary | ICD-10-CM | POA: Diagnosis present

## 2023-11-29 DIAGNOSIS — Z90710 Acquired absence of both cervix and uterus: Secondary | ICD-10-CM

## 2023-11-29 LAB — CBC WITH DIFFERENTIAL/PLATELET
Abs Immature Granulocytes: 0.03 10*3/uL (ref 0.00–0.07)
Basophils Absolute: 0.1 10*3/uL (ref 0.0–0.1)
Basophils Relative: 1 %
Eosinophils Absolute: 0.1 10*3/uL (ref 0.0–0.5)
Eosinophils Relative: 1 %
HCT: 45.2 % (ref 36.0–46.0)
Hemoglobin: 15.3 g/dL — ABNORMAL HIGH (ref 12.0–15.0)
Immature Granulocytes: 0 %
Lymphocytes Relative: 22 %
Lymphs Abs: 1.9 10*3/uL (ref 0.7–4.0)
MCH: 31.5 pg (ref 26.0–34.0)
MCHC: 33.8 g/dL (ref 30.0–36.0)
MCV: 93.2 fL (ref 80.0–100.0)
Monocytes Absolute: 0.6 10*3/uL (ref 0.1–1.0)
Monocytes Relative: 7 %
Neutro Abs: 6 10*3/uL (ref 1.7–7.7)
Neutrophils Relative %: 69 %
Platelets: 305 10*3/uL (ref 150–400)
RBC: 4.85 MIL/uL (ref 3.87–5.11)
RDW: 13.2 % (ref 11.5–15.5)
WBC: 8.7 10*3/uL (ref 4.0–10.5)
nRBC: 0 % (ref 0.0–0.2)

## 2023-11-29 LAB — MAGNESIUM: Magnesium: 2.1 mg/dL (ref 1.7–2.4)

## 2023-11-29 LAB — I-STAT CG4 LACTIC ACID, ED: Lactic Acid, Venous: 1.8 mmol/L (ref 0.5–1.9)

## 2023-11-29 LAB — I-STAT VENOUS BLOOD GAS, ED
Acid-base deficit: 1 mmol/L (ref 0.0–2.0)
Bicarbonate: 26.3 mmol/L (ref 20.0–28.0)
Calcium, Ion: 1.24 mmol/L (ref 1.15–1.40)
HCT: 45 % (ref 36.0–46.0)
Hemoglobin: 15.3 g/dL — ABNORMAL HIGH (ref 12.0–15.0)
O2 Saturation: 96 %
Potassium: 3.8 mmol/L (ref 3.5–5.1)
Sodium: 142 mmol/L (ref 135–145)
TCO2: 28 mmol/L (ref 22–32)
pCO2, Ven: 51.1 mm[Hg] (ref 44–60)
pH, Ven: 7.319 (ref 7.25–7.43)
pO2, Ven: 90 mm[Hg] — ABNORMAL HIGH (ref 32–45)

## 2023-11-29 LAB — COMPREHENSIVE METABOLIC PANEL
ALT: 66 U/L — ABNORMAL HIGH (ref 0–44)
AST: 30 U/L (ref 15–41)
Albumin: 3.6 g/dL (ref 3.5–5.0)
Alkaline Phosphatase: 73 U/L (ref 38–126)
Anion gap: 12 (ref 5–15)
BUN: 18 mg/dL (ref 8–23)
CO2: 25 mmol/L (ref 22–32)
Calcium: 9.6 mg/dL (ref 8.9–10.3)
Chloride: 106 mmol/L (ref 98–111)
Creatinine, Ser: 0.87 mg/dL (ref 0.44–1.00)
GFR, Estimated: >60 mL/min (ref >60)
Glucose, Bld: 118 mg/dL — ABNORMAL HIGH (ref 70–99)
Potassium: 4.1 mmol/L (ref 3.5–5.1)
Sodium: 143 mmol/L (ref 135–145)
Total Bilirubin: 1.1 mg/dL (ref 0.0–1.2)
Total Protein: 7 g/dL (ref 6.5–8.1)

## 2023-11-29 LAB — AMMONIA: Ammonia: 17 umol/L (ref 9–35)

## 2023-11-29 LAB — LIPASE, BLOOD: Lipase: 23 U/L (ref 11–51)

## 2023-11-29 LAB — ETHANOL: Alcohol, Ethyl (B): <10 mg/dL (ref <10)

## 2023-11-29 LAB — CBG MONITORING, ED: Glucose-Capillary: 105 mg/dL — ABNORMAL HIGH (ref 70–99)

## 2023-11-29 LAB — HIV ANTIBODY (ROUTINE TESTING W REFLEX): HIV Screen 4th Generation wRfx: NONREACTIVE

## 2023-11-29 MED ORDER — AMOXICILLIN-POT CLAVULANATE 875-125 MG PO TABS
1.0000 | ORAL_TABLET | Freq: Two times a day (BID) | ORAL | Status: DC
  Administered 2023-11-30 – 2023-12-01 (×3): 1 via ORAL
  Filled 2023-11-29 (×3): qty 1

## 2023-11-29 MED ORDER — LACTATED RINGERS IV BOLUS
1000.0000 mL | Freq: Once | INTRAVENOUS | Status: AC
Start: 2023-11-29 — End: 2023-11-29
  Administered 2023-11-29: 1000 mL via INTRAVENOUS

## 2023-11-29 MED ORDER — ENOXAPARIN SODIUM 40 MG/0.4ML IJ SOSY
40.0000 mg | PREFILLED_SYRINGE | INTRAMUSCULAR | Status: DC
  Administered 2023-11-29 – 2023-11-30 (×2): 40 mg via SUBCUTANEOUS
  Filled 2023-11-29 (×2): qty 0.4

## 2023-11-29 MED ORDER — SODIUM CHLORIDE 0.9 % IV SOLN: INTRAVENOUS | Status: DC

## 2023-11-29 MED ORDER — HYDROMORPHONE HCL 1 MG/ML IJ SOLN
0.5000 mg | Freq: Once | INTRAMUSCULAR | Status: AC
  Administered 2023-11-29: 0.5 mg via INTRAVENOUS
  Filled 2023-11-29: qty 0.5

## 2023-11-29 MED ORDER — ONDANSETRON HCL 4 MG/2ML IJ SOLN: 4.0000 mg | Freq: Once | INTRAMUSCULAR | Status: AC

## 2023-11-29 MED ORDER — METOCLOPRAMIDE HCL 5 MG/ML IJ SOLN
10.0000 mg | Freq: Once | INTRAMUSCULAR | Status: AC
  Administered 2023-11-29: 10 mg via INTRAVENOUS
  Filled 2023-11-29: qty 2

## 2023-11-29 MED ORDER — ONDANSETRON HCL 4 MG PO TABS: 4.0000 mg | ORAL_TABLET | Freq: Four times a day (QID) | ORAL | Status: DC | PRN

## 2023-11-29 MED ORDER — IOHEXOL 350 MG/ML SOLN
75.0000 mL | Freq: Once | INTRAVENOUS | Status: AC | PRN
  Administered 2023-11-29: 75 mL via INTRAVENOUS

## 2023-11-29 MED ORDER — LORAZEPAM 2 MG/ML IJ SOLN
1.0000 mg | Freq: Once | INTRAMUSCULAR | Status: AC | PRN
  Administered 2023-11-30: 1 mg via INTRAVENOUS
  Filled 2023-11-29: qty 1

## 2023-11-29 MED ORDER — PANTOPRAZOLE SODIUM 40 MG IV SOLR
40.0000 mg | Freq: Two times a day (BID) | INTRAVENOUS | Status: DC
  Administered 2023-11-29 – 2023-12-01 (×4): 40 mg via INTRAVENOUS
  Filled 2023-11-29 (×4): qty 10

## 2023-11-29 MED ORDER — ONDANSETRON HCL 4 MG/2ML IJ SOLN
4.0000 mg | Freq: Four times a day (QID) | INTRAMUSCULAR | Status: DC | PRN
  Administered 2023-11-30: 4 mg via INTRAVENOUS
  Filled 2023-11-29: qty 2

## 2023-11-29 MED ORDER — LORAZEPAM 2 MG/ML IJ SOLN: 1.0000 mg | Freq: Once | INTRAMUSCULAR | Status: DC

## 2023-11-29 MED ORDER — HYDROXYCHLOROQUINE SULFATE 200 MG PO TABS
200.0000 mg | ORAL_TABLET | Freq: Every day | ORAL | Status: DC
  Administered 2023-11-30 – 2023-12-01 (×2): 200 mg via ORAL
  Filled 2023-11-29 (×3): qty 1

## 2023-11-29 MED ORDER — ONDANSETRON HCL 4 MG/2ML IJ SOLN
INTRAMUSCULAR | Status: AC
  Administered 2023-11-29: 4 mg via INTRAVENOUS
  Filled 2023-11-29: qty 2

## 2023-11-29 NOTE — ED Notes (Signed)
 Patient transported to CT

## 2023-11-29 NOTE — H&P (Signed)
History and Physical    Patient: Teresa Lang NWG:[id number] DOB: Jun 29, 1956 DOA: 11/29/2023 DOS: the patient was seen and examined on 11/29/2023 PCP: Patrice Paradise, MD  Patient coming from: Home  Chief Complaint:  Chief Complaint  Patient presents with   Altered Mental Status   HPI: Teresa Lang is a 68 y.o. female with medical history significant of COPD, asthma, rheumatoid arthitis, GERD, HTN, HLD presenting with encephalopathy, recurrent vomiting. History primarily from family. Per report, pt w/ worsening confusion over past 12-24 hours. Also w/ recurrent episodes of emesis. Emesis non bloody. ? Bilious etiology. Had worsening confusion w/ emesis. No reports of chest pain,SOB, cough. Minimal to mild abdominal pain. No focal hemiparesis or confusion. Some concern for headache with family. Family reports similar episode 2-3 years ago assd with patient missing medication. There was also concern for subclinical dementia during that evaluation. Per the family, the patient has been out of at least half  of her medications for several months secondary to cost.  Presented to ER afebrile, hemodynamically stable. Satting well on RA. WBC 8.7, hgb 15.3, plt 305, VBG WNL. Lactate stable. Ct 0.87. CT head WNL. UA still pending. CXR and CT A&P were not ordered as part of initial evaluation.  Review of Systems: As mentioned in the history of present illness. All other systems reviewed and are negative. Past Medical History:  Diagnosis Date   Allergy    seasonal   Anxiety    Arthritis    Asthma    COPD (chronic obstructive pulmonary disease) (HCC)    Depression    Dyspnea    GERD (gastroesophageal reflux disease)    Hyperlipidemia    Hypertension    Past Surgical History:  Procedure Laterality Date   ABDOMINAL HYSTERECTOMY  1983   CERVICAL DISC SURGERY     CHOLECYSTECTOMY  1983   COLONOSCOPY WITH PROPOFOL N/A 08/08/2022   Procedure: COLONOSCOPY WITH PROPOFOL;   Surgeon: Regis Bill, MD;  Location: [facility] ENDOSCOPY;  Service: Endoscopy;  Laterality: N/A;   ESOPHAGOGASTRODUODENOSCOPY N/A 08/08/2022   Procedure: ESOPHAGOGASTRODUODENOSCOPY (EGD);  Surgeon: Regis Bill, MD;  Location: [facility] ENDOSCOPY;  Service: Endoscopy;  Laterality: N/A;   HIP ARTHROPLASTY Right 03/20/2021   Procedure: ARTHROPLASTY BIPOLAR HIP (HEMIARTHROPLASTY);  Surgeon: Christena Flake, MD;  Location: [facility] ORS;  Service: Orthopedics;  Laterality: Right;   LUMBAR LAMINECTOMY/DECOMPRESSION MICRODISCECTOMY Right 11/24/2019   Procedure: OPEN L3/4 LAMINECTOMY WITH CYST REMOVAL;  Surgeon: Lucy Chris, MD;  Location: [facility] ORS;  Service: Neurosurgery;  Laterality: Right;   SPINE SURGERY  beginning 1994   laminectomies, fusions   Social History:  reports that she has been smoking e-cigarettes. She has been exposed to tobacco smoke. She has never used smokeless tobacco. She reports that she does not currently use alcohol. She reports that she does not currently use drugs.  Allergies  Allergen Reactions   Prednisone Anxiety    Family History  Problem Relation Age of Onset   Alcohol abuse Father    Celiac disease Sister    Drug abuse Maternal Aunt    Suicidality Maternal Aunt    Alcohol abuse Paternal Grandfather    Bipolar disorder Daughter    Drug abuse Son    Breast cancer Neg Hx     Prior to Admission medications   Medication Sig Start Date End Date Taking? Authorizing Provider  albuterol (VENTOLIN HFA) 108 (90 Base) MCG/ACT inhaler Inhale 2 puffs into the lungs every 6 (six) hours as needed for wheezing  or shortness of breath. 03/27/19  Yes Clapacs, Jackquline Denmark, MD  alendronate (FOSAMAX) 70 MG tablet Take 70 mg by mouth once a week. 05/24/23 05/23/24 Yes [provider]  aspirin EC 81 MG tablet Take 81 mg by mouth.   Yes [provider]  aspirin-acetaminophen-caffeine (EXCEDRIN MIGRAINE) [phone] MG tablet Take by mouth every 6 (six) hours as needed for  headache.   Yes [provider]  atorvastatin (LIPITOR) 20 MG tablet Take 20 mg by mouth daily. 01/31/21  Yes [provider]  Baclofen 5 MG TABS TAKE ONE TABLET BY MOUTH DAILY AT 9AM EVERY MORNING and TAKE TWO TABLETS BY MOUTH DAILY AT 9PM AT BEDTIME 07/05/22  Yes [provider]  busPIRone (BUSPAR) 10 MG tablet Take 10 mg by mouth 3 (three) times daily.   Yes [provider]  diltiazem (CARDIZEM CD) 240 MG 24 hr capsule Take 240 mg by mouth daily. 08/21/19  Yes [provider]  DULoxetine (CYMBALTA) 60 MG capsule Take 60 mg by mouth daily.   Yes [provider]  folic acid (FOLVITE) 1 MG tablet Take 1 tablet (1 mg total) by mouth daily. 03/27/19  Yes Clapacs, Jackquline Denmark, MD  hydroxychloroquine (PLAQUENIL) 200 MG tablet Take 200 mg by mouth daily. 05/23/22  Yes [provider]  ipratropium-albuterol (DUONEB) 0.5-2.5 (3) MG/3ML SOLN Inhale 3 mLs into the lungs every 4 (four) hours as needed (wheezing). 08/22/23 08/16/24 Yes [provider]  lisinopril (ZESTRIL) 20 MG tablet Take 1 tablet (20 mg total) by mouth daily. 03/27/19  Yes Clapacs, Jackquline Denmark, MD  methotrexate (RHEUMATREX) 2.5 MG tablet Take 17.5 mg by mouth every Sunday.   Yes [provider]  metoprolol tartrate (LOPRESSOR) 25 MG tablet Take 25 mg by mouth 2 (two) times daily.   Yes [provider]  omeprazole (PRILOSEC) 40 MG capsule Take 40 mg by mouth daily as needed (acid reflux symptoms).   Yes [provider]  oxybutynin (DITROPAN-XL) 10 MG 24 hr tablet Take 1 tablet (10 mg total) by mouth at bedtime. 05/14/23  Yes MacDiarmid, Lorin Picket, MD  OXYGEN Inhale into the lungs. PRN   Yes [provider]  solifenacin (VESICARE) 5 MG tablet Take 1 tablet (5 mg total) by mouth daily. 05/14/23  Yes MacDiarmid, Lorin Picket, MD  ARIPiprazole (ABILIFY) 15 MG tablet Take 15 mg by mouth daily. Patient not taking: Reported on 11/29/2023    [provider]   TRELEGY ELLIPTA 100-62.5-25 MCG/INH AEPB Inhale 2 puffs into the lungs daily. Patient not taking: Reported on 11/29/2023 05/22/19   [provider]    Physical Exam: Vitals:   11/29/23 1330 11/29/23 1335 11/29/23 1345  BP: (!) 168/91  (!) 149/84  Pulse: 85  87  Resp: 16  18  Temp: 97.6 F (36.4 C)    TempSrc: Oral    SpO2: 97%  97%  Height:  5\' 5"  (1.651 m)    Physical Exam Constitutional:      Appearance: She is normal weight.  HENT:     Head: Normocephalic and atraumatic.     Nose: Nose normal.     Mouth/Throat:     Mouth: Mucous membranes are dry.  Eyes:     Extraocular Movements: Extraocular movements intact.     Pupils: Pupils are equal, round, and reactive to light.  Cardiovascular:     Rate and Rhythm: Normal rate and regular rhythm.  Pulmonary:     Effort: Pulmonary effort is normal.  Abdominal:  General: Bowel sounds are normal.  Musculoskeletal:        General: Normal range of motion.  Skin:    General: Skin is dry.  Neurological:     General: No focal deficit present.     Comments: + mild generalized confusion  Grossly nonfocal neuro exam    Psychiatric:        Mood and Affect: Mood normal.     Data Reviewed:  There are no new results to review at this time.  DG CHEST PORT 1 VIEW CLINICAL DATA:  Encephalopathy  EXAM: PORTABLE CHEST 1 VIEW  COMPARISON:  04/03/2022, CT 04/03/2022  FINDINGS: Hyperinflation with emphysema and bronchitic changes. No focal airspace disease or effusion. Stable cardiomediastinal silhouette. No pneumothorax.  IMPRESSION: No active disease. Hyperinflation with emphysema and bronchitic changes.  Electronically Signed   By: Jasmine Pang M.D.   On: 11/29/2023 17:35 CT Head Wo Contrast CLINICAL DATA:  Altered mental status.  EXAM: CT HEAD WITHOUT CONTRAST  TECHNIQUE: Contiguous axial images were obtained from the base of the skull through the vertex without intravenous contrast.  RADIATION  DOSE REDUCTION: This exam was performed according to the departmental dose-optimization program which includes automated exposure control, adjustment of the mA and/or kV according to patient size and/or use of iterative reconstruction technique.  COMPARISON:  Head CT dated 08/19/2019.  FINDINGS: Brain: The ventricles and sulci are appropriate size for the patient's age. The gray-white matter discrimination is preserved. There is no acute intracranial hemorrhage. No mass effect or midline shift. No extra-axial fluid collection.  Vascular: No hyperdense vessel or unexpected calcification.  Skull: Normal. Negative for fracture or focal lesion.  Sinuses/Orbits: Near complete opacification of the left maxillary sinus. No air-fluid. The visualized paranasal sinuses and mastoid air cells are clear.  Other: None  IMPRESSION: 1. No acute intracranial pathology. 2. Left maxillary sinus disease.  Electronically Signed   By: Elgie Collard M.D.   On: 11/29/2023 14:33  Last metabolic panel Lab Results  Component Value Date   GLUCOSE 118 (H) 11/29/2023   NA 142 11/29/2023   K 3.8 11/29/2023   CL 106 11/29/2023   CO2 25 11/29/2023   BUN 18 11/29/2023   CREATININE 0.87 11/29/2023   GFRNONAA >60 11/29/2023   CALCIUM 9.6 11/29/2023   PROT 7.0 11/29/2023   ALBUMIN 3.6 11/29/2023   LABGLOB 2.9 01/02/2018   AGRATIO 1.5 01/02/2018   BILITOT 1.1 11/29/2023   ALKPHOS 73 11/29/2023   AST 30 11/29/2023   ALT 66 (H) 11/29/2023   ANIONGAP 12 11/29/2023   Lab Results  Component Value Date   WBC 8.7 11/29/2023   HGB 15.3 (H) 11/29/2023   HCT 45.0 11/29/2023   MCV 93.2 11/29/2023   PLT 305 11/29/2023    Assessment and Plan: * Encephalopathy + generalized confusion over last 2-3 days w/ noted headache and intractable nausea and vomiting  Noted prior history of similar sxs assd w/ missed medications Per the daughter, pt has missed multiple medication including duloxetine over  multiple months (cost) CT A&P pending to rule out GI etiology w/ recurrent emesis  pCXR to eval for PNA pending  UA pending  Ammonia level WNL CT head stable- MRI brain pending  Check VBG  Will otherwise monitor for now and hold any potential sedating medications   Vomiting Recurrent vomiting over the last 1-2 days-bilious per the family  Minimal to mild general abdominal pain on exam  CT A&P pending  Will place on IV PPI  Antiemetics  Follow up CT imaging    Opioid use disorder, severe, in sustained remission (HCC) UDS pending in setting of encephalopathy   Chronic obstructive pulmonary disease, unspecified (HCC) Appears fairly stable from a resp standpoint  VBG WNL  Cont home inhalers  Monitor    Rheumatoid arthritis involving multiple sites with positive rheumatoid factor (HCC) Cont plaquenil  Hypertension, essential BP stable  Titrate home regimen    Fibromyalgia Previously prescribed baclofen and cymbalta- has not had medication for several months per family  Cont to hold for now Monitor       Advance Care Planning:   Code Status: Full Code   Consults: None at present   Family Communication: Family at the bedside   Severity of Illness: The appropriate patient status for this patient is OBSERVATION. Observation status is judged to be reasonable and necessary in order to provide the required intensity of service to ensure the patient's safety. The patient's presenting symptoms, physical exam findings, and initial radiographic and laboratory data in the context of their medical condition is felt to place them at decreased risk for further clinical deterioration. Furthermore, it is anticipated that the patient will be medically stable for discharge from the hospital within 2 midnights of admission.   Author: Floydene Flock, MD 11/29/2023 5:23 PM  For on call review [email or web address].

## 2023-11-29 NOTE — Assessment & Plan Note (Signed)
Previously prescribed baclofen and cymbalta- has not had medication for several months per family  Cont to hold for now Monitor

## 2023-11-29 NOTE — Assessment & Plan Note (Signed)
 Cont plaquenil

## 2023-11-29 NOTE — ED Triage Notes (Signed)
Patient arrives via [facility] from home for altered mental status and emesis. Granddaughter called EMS for emesis and AMS. Per EMS, granddaughter is a poor historian. LKW 2230 last night, patient woke up 0830. Patient is confused and unaware of what is going on per family. 1230 today became more altered, became hot with profuse vomiting. Patient has had 15 episodes of vomiting with EMS of yellow bile. Negative for deficits with EMS but unable to follow commands. No obvious facial droop, granddaughter believes she had stroke x1 year ago with no deficits. At baseline independent. Patient able to name granddaughter to EMS, unable to answer any other questions. No falls. Hx of HTN, GERD  CBG 124 170/100 12 lead insignificant  20 in left forearm- 4mg  zofran- effective.

## 2023-11-29 NOTE — Progress Notes (Signed)
MRI imaging concerning for L sided sinusitis.  Will start on augmentin for treatment  Monitor.

## 2023-11-29 NOTE — Assessment & Plan Note (Signed)
Recurrent vomiting over the last 1-2 days-bilious per the family  Minimal to mild general abdominal pain on exam  CT A&P pending  Will place on IV PPI  Antiemetics  Follow up CT imaging

## 2023-11-29 NOTE — Assessment & Plan Note (Addendum)
+   generalized confusion over last 2-3 days w/ noted headache and intractable nausea and vomiting  Noted prior history of similar sxs assd w/ missed medications Per the daughter, pt has missed multiple medication including duloxetine over multiple months (cost) CT A&P pending to rule out GI etiology w/ recurrent emesis  pCXR to eval for PNA pending  UA pending  Ammonia level WNL CT head stable- MRI brain pending  Check VBG  Will otherwise monitor for now and hold any potential sedating medications

## 2023-11-29 NOTE — ED Provider Notes (Addendum)
[place] EMERGENCY DEPARTMENT AT [facility] Provider Note   CSN: [id number] Arrival date & time: 11/29/23  1315     History  Chief Complaint  Patient presents with   Altered Mental Status    Teresa Lang is a 68 y.o. female.  Like 830 patient is a 68 year old female past medical history of PTSD, fibromyalgia, RA, chronic pain syndrome, prior substance use presenting to the emergency department with altered mental status.  Per EMS, family called 911.  The patient was found to be altered this morning.  She was apparently normal at her baseline going to bed last night.  She started to develop vomiting this morning and vomited multiple times and route per EMS.  She did receive 4 mg of Zofran with some improvement.  Patient was unable to give any additional history but does deny any pain to me at this time.  The history is provided by the EMS personnel and a relative.  Altered Mental Status      Home Medications Prior to Admission medications   Medication Sig Start Date End Date Taking? Authorizing Provider  albuterol (VENTOLIN HFA) 108 (90 Base) MCG/ACT inhaler Inhale 2 puffs into the lungs every 6 (six) hours as needed for wheezing or shortness of breath. 03/27/19  Yes Clapacs, Jackquline Denmark, MD  alendronate (FOSAMAX) 70 MG tablet Take 70 mg by mouth once a week. 05/24/23 05/23/24 Yes [provider]  aspirin EC 81 MG tablet Take 81 mg by mouth.   Yes [provider]  aspirin-acetaminophen-caffeine (EXCEDRIN MIGRAINE) [phone] MG tablet Take by mouth every 6 (six) hours as needed for headache.   Yes [provider]  atorvastatin (LIPITOR) 20 MG tablet Take 20 mg by mouth daily. 01/31/21  Yes [provider]  Baclofen 5 MG TABS TAKE ONE TABLET BY MOUTH DAILY AT 9AM EVERY MORNING and TAKE TWO TABLETS BY MOUTH DAILY AT 9PM AT BEDTIME 07/05/22  Yes [provider]  busPIRone (BUSPAR) 10 MG tablet Take 10 mg by mouth 3 (three) times  daily.   Yes [provider]  diltiazem (CARDIZEM CD) 240 MG 24 hr capsule Take 240 mg by mouth daily. 08/21/19  Yes [provider]  DULoxetine (CYMBALTA) 60 MG capsule Take 60 mg by mouth daily.   Yes [provider]  folic acid (FOLVITE) 1 MG tablet Take 1 tablet (1 mg total) by mouth daily. 03/27/19  Yes Clapacs, Jackquline Denmark, MD  hydroxychloroquine (PLAQUENIL) 200 MG tablet Take by mouth. 05/23/22  Yes [provider]  ipratropium-albuterol (DUONEB) 0.5-2.5 (3) MG/3ML SOLN Inhale 3 mLs into the lungs every 4 (four) hours as needed (wheezing). 08/22/23 08/16/24 Yes [provider]  lisinopril (ZESTRIL) 20 MG tablet Take 1 tablet (20 mg total) by mouth daily. 03/27/19  Yes Clapacs, Jackquline Denmark, MD  methotrexate (RHEUMATREX) 2.5 MG tablet Take 17.5 mg by mouth every Sunday.   Yes [provider]  metoprolol tartrate (LOPRESSOR) 25 MG tablet Take 25 mg by mouth 2 (two) times daily.   Yes [provider]  omeprazole (PRILOSEC) 40 MG capsule Take 40 mg by mouth daily as needed (acid reflux symptoms).   Yes [provider]  oxybutynin (DITROPAN-XL) 10 MG 24 hr tablet Take 1 tablet (10 mg total) by mouth at bedtime. 05/14/23  Yes MacDiarmid, Lorin Picket, MD  OXYGEN Inhale into the lungs. PRN   Yes [provider]  solifenacin (VESICARE) 5 MG tablet Take 1 tablet (5 mg total) by mouth daily.  05/14/23  Yes MacDiarmid, Lorin Picket, MD  ARIPiprazole (ABILIFY) 15 MG tablet Take 15 mg by mouth daily. Patient not taking: Reported on 11/29/2023    [provider]  TRELEGY ELLIPTA 100-62.5-25 MCG/INH AEPB Inhale 2 puffs into the lungs daily. Patient not taking: Reported on 11/29/2023 05/22/19   [provider]      Allergies    Prednisone    Review of Systems   Review of Systems  Physical Exam Updated Vital Signs BP (!) 149/84   Pulse 87   Temp 97.6 F (36.4 C) (Oral)   Resp 18   Ht 5\' 5"  (1.651 m)   SpO2 97%   BMI 26.46 kg/m   Physical Exam Vitals and nursing note reviewed.  Constitutional:      General: She is not in acute distress.    Appearance: Normal appearance. She is ill-appearing.     Comments: Actively vomiting yellow vomitus on exam  HENT:     Head: Normocephalic.     Nose: Nose normal.     Mouth/Throat:     Mouth: Mucous membranes are moist.     Pharynx: Oropharynx is clear.  Eyes:     Extraocular Movements: Extraocular movements intact.     Conjunctiva/sclera: Conjunctivae normal.     Pupils: Pupils are equal, round, and reactive to light.  Cardiovascular:     Rate and Rhythm: Normal rate and regular rhythm.     Heart sounds: Normal heart sounds.  Pulmonary:     Effort: Pulmonary effort is normal.     Breath sounds: Normal breath sounds.  Abdominal:     General: Abdomen is flat.     Palpations: Abdomen is soft.     Tenderness: There is no abdominal tenderness.  Musculoskeletal:        General: Normal range of motion.     Cervical back: Normal range of motion.     Right lower leg: No edema.     Left lower leg: No edema.  Skin:    General: Skin is warm and dry.  Neurological:     Mental Status: She is alert.     Comments: Oriented to person only, following commands in all 4 extremities, equal strength  Psychiatric:        Mood and Affect: Mood normal.        Behavior: Behavior normal.     ED Results / Procedures / Treatments   Labs (all labs ordered are listed, but only abnormal results are displayed) Labs Reviewed  COMPREHENSIVE METABOLIC PANEL - Abnormal; Notable for the following components:      Result Value   Glucose, Bld 118 (*)    ALT 66 (*)    All other components within normal limits  CBC WITH DIFFERENTIAL/PLATELET - Abnormal; Notable for the following components:   Hemoglobin 15.3 (*)    All other components within normal limits  CBG MONITORING, ED - Abnormal; Notable for the following components:   Glucose-Capillary 105 (*)    All other components within normal  limits  I-STAT VENOUS BLOOD GAS, ED - Abnormal; Notable for the following components:   pO2, Ven 90 (*)    Hemoglobin 15.3 (*)    All other components within normal limits  LIPASE, BLOOD  ETHANOL  AMMONIA  MAGNESIUM  URINALYSIS, W/ REFLEX TO CULTURE (INFECTION SUSPECTED)  RAPID URINE DRUG SCREEN, HOSP PERFORMED  I-STAT CG4 LACTIC ACID, ED    EKG EKG Interpretation Date/Time:  Thursday November 29 2023 13:43:15 EST Ventricular Rate:  89 PR Interval:  159 QRS Duration:  81 QT Interval:  381 QTC Calculation: 464 R Axis:   90  Text Interpretation: Sinus rhythm Anteroseptal infarct, age indeterminate No significant change since last tracing Confirmed by Elayne Snare ([id number]) on 11/29/2023 1:49:50 PM  Radiology CT Head Wo Contrast Result Date: 11/29/2023 CLINICAL DATA:  Altered mental status. EXAM: CT HEAD WITHOUT CONTRAST TECHNIQUE: Contiguous axial images were obtained from the base of the skull through the vertex without intravenous contrast. RADIATION DOSE REDUCTION: This exam was performed according to the departmental dose-optimization program which includes automated exposure control, adjustment of the mA and/or kV according to patient size and/or use of iterative reconstruction technique. COMPARISON:  Head CT dated 08/19/2019. FINDINGS: Brain: The ventricles and sulci are appropriate size for the patient's age. The gray-white matter discrimination is preserved. There is no acute intracranial hemorrhage. No mass effect or midline shift. No extra-axial fluid collection. Vascular: No hyperdense vessel or unexpected calcification. Skull: Normal. Negative for fracture or focal lesion. Sinuses/Orbits: Near complete opacification of the left maxillary sinus. No air-fluid. The visualized paranasal sinuses and mastoid air cells are clear. Other: None IMPRESSION: 1. No acute intracranial pathology. 2. Left maxillary sinus disease. Electronically Signed   By: Elgie Collard M.D.   On:  11/29/2023 14:33    Procedures Procedures    Medications Ordered in ED Medications  ondansetron (ZOFRAN) injection 4 mg (4 mg Intravenous Given 11/29/23 1342)  lactated ringers bolus 1,000 mL (0 mLs Intravenous Stopped 11/29/23 1530)  metoCLOPramide (REGLAN) injection 10 mg (10 mg Intravenous Given 11/29/23 1619)    ED Course/ Medical Decision Making/ A&P Clinical Course as of 11/29/23 1626  Thu Nov 29, 2023  1552 [facility] without acute disease. Labs within normal range, urine is pending. Patient will require admission for her AMS. [VK]  1625 Family at bedside states she had a similar presentation though less severe several years ago and once her medications were stabilized she returned to normal.  She reports that she has been taking her medications consistently doing it to not being able to afford them but has been on all her medications for at least the last 1 to 2 weeks. [VK]    Clinical Course User Index [VK] Rexford Maus, DO                                 Medical Decision Making This patient presents to the ED with chief complaint(s) of AMS, vomiting with pertinent past medical history of PTSD, depression, fibromyalgia, chronic pain, COPD, cardiomyopathy, prior substance use which further complicates the presenting complaint. The complaint involves an extensive differential diagnosis and also carries with it a high risk of complications and morbidity.    The differential diagnosis includes ICH, mass effect, CVA, arrhythmia, anemia, atypical ACS, electrolyte abnormality, hepatic encephalopathy, intoxication, withdrawal, infection, dehydration  Additional history obtained: Additional history obtained from EMS  Records reviewed Primary Care Documents  ED Course and Reassessment: On patient's arrival she was hemodynamically stable, actively vomiting, oriented only to self.  Was given additional Zofran here and was started on IV fluids.  The patient will have EKG, labs and head  CT to further evaluate for cause of her altered mental status and vomiting and will be closely reassessed.  Independent labs interpretation:  The following labs were independently interpreted: Within normal range  Independent visualization of imaging: - I independently visualized the following imaging with scope  of interpretation limited to determining acute life threatening conditions related to emergency care: CT head, which revealed no acute disease  Consultation: - Consulted or discussed management/test interpretation w/ external professional: Hospitalists  Consideration for admission or further workup: Patient requires admission for altered mental status Social Determinants of health: N/A    Amount and/or Complexity of Data Reviewed Labs: ordered. Radiology: ordered.  Risk Prescription drug management. Decision regarding hospitalization.          Final Clinical Impression(s) / ED Diagnoses Final diagnoses:  Disorientation  Nausea and vomiting, unspecified vomiting type    Rx / DC Orders ED Discharge Orders     None         Rexford Maus, DO 11/29/23 1627    Elayne Snare K, DO 11/29/23 1636

## 2023-11-29 NOTE — Assessment & Plan Note (Signed)
Appears fairly stable from a resp standpoint  VBG WNL  Cont home inhalers  Monitor

## 2023-11-29 NOTE — Assessment & Plan Note (Signed)
UDS pending in setting of encephalopathy

## 2023-11-29 NOTE — Assessment & Plan Note (Signed)
 BP stable Titrate home regimen

## 2023-11-30 DIAGNOSIS — E785 Hyperlipidemia, unspecified: Secondary | ICD-10-CM | POA: Diagnosis present

## 2023-11-30 DIAGNOSIS — K219 Gastro-esophageal reflux disease without esophagitis: Secondary | ICD-10-CM | POA: Diagnosis present

## 2023-11-30 DIAGNOSIS — R410 Disorientation, unspecified: Secondary | ICD-10-CM | POA: Diagnosis present

## 2023-11-30 DIAGNOSIS — G9340 Encephalopathy, unspecified: Secondary | ICD-10-CM | POA: Diagnosis not present

## 2023-11-30 DIAGNOSIS — I10 Essential (primary) hypertension: Secondary | ICD-10-CM | POA: Diagnosis present

## 2023-11-30 DIAGNOSIS — F1121 Opioid dependence, in remission: Secondary | ICD-10-CM | POA: Diagnosis present

## 2023-11-30 DIAGNOSIS — F1290 Cannabis use, unspecified, uncomplicated: Secondary | ICD-10-CM | POA: Diagnosis present

## 2023-11-30 DIAGNOSIS — Z96641 Presence of right artificial hip joint: Secondary | ICD-10-CM | POA: Diagnosis present

## 2023-11-30 DIAGNOSIS — Z7983 Long term (current) use of bisphosphonates: Secondary | ICD-10-CM | POA: Diagnosis not present

## 2023-11-30 DIAGNOSIS — Z888 Allergy status to other drugs, medicaments and biological substances status: Secondary | ICD-10-CM | POA: Diagnosis not present

## 2023-11-30 DIAGNOSIS — Z79899 Other long term (current) drug therapy: Secondary | ICD-10-CM | POA: Diagnosis not present

## 2023-11-30 DIAGNOSIS — F17290 Nicotine dependence, other tobacco product, uncomplicated: Secondary | ICD-10-CM | POA: Diagnosis present

## 2023-11-30 DIAGNOSIS — G894 Chronic pain syndrome: Secondary | ICD-10-CM | POA: Diagnosis present

## 2023-11-30 DIAGNOSIS — J4489 Other specified chronic obstructive pulmonary disease: Secondary | ICD-10-CM | POA: Diagnosis present

## 2023-11-30 DIAGNOSIS — Z9049 Acquired absence of other specified parts of digestive tract: Secondary | ICD-10-CM | POA: Diagnosis not present

## 2023-11-30 DIAGNOSIS — R112 Nausea with vomiting, unspecified: Secondary | ICD-10-CM | POA: Diagnosis present

## 2023-11-30 DIAGNOSIS — Z90710 Acquired absence of both cervix and uterus: Secondary | ICD-10-CM | POA: Diagnosis not present

## 2023-11-30 DIAGNOSIS — M797 Fibromyalgia: Secondary | ICD-10-CM | POA: Diagnosis present

## 2023-11-30 DIAGNOSIS — Z7982 Long term (current) use of aspirin: Secondary | ICD-10-CM | POA: Diagnosis not present

## 2023-11-30 DIAGNOSIS — M0579 Rheumatoid arthritis with rheumatoid factor of multiple sites without organ or systems involvement: Secondary | ICD-10-CM | POA: Diagnosis present

## 2023-11-30 DIAGNOSIS — G929 Unspecified toxic encephalopathy: Secondary | ICD-10-CM | POA: Diagnosis present

## 2023-11-30 LAB — URINALYSIS, W/ REFLEX TO CULTURE (INFECTION SUSPECTED)
Bilirubin Urine: NEGATIVE
Glucose, UA: NEGATIVE mg/dL
Hgb urine dipstick: NEGATIVE
Ketones, ur: 5 mg/dL — AB
Leukocytes,Ua: NEGATIVE
Nitrite: NEGATIVE
Protein, ur: 30 mg/dL — AB
Specific Gravity, Urine: 1.045 — ABNORMAL HIGH (ref 1.005–1.030)
pH: 6 (ref 5.0–8.0)

## 2023-11-30 LAB — COMPREHENSIVE METABOLIC PANEL WITH GFR
ALT: 50 U/L — ABNORMAL HIGH (ref 0–44)
AST: 24 U/L (ref 15–41)
Albumin: 3 g/dL — ABNORMAL LOW (ref 3.5–5.0)
Alkaline Phosphatase: 60 U/L (ref 38–126)
Anion gap: 10 (ref 5–15)
BUN: 13 mg/dL (ref 8–23)
CO2: 24 mmol/L (ref 22–32)
Calcium: 9.1 mg/dL (ref 8.9–10.3)
Chloride: 109 mmol/L (ref 98–111)
Creatinine, Ser: 0.7 mg/dL (ref 0.44–1.00)
GFR, Estimated: >60 mL/min
Glucose, Bld: 98 mg/dL (ref 70–99)
Potassium: 3.5 mmol/L (ref 3.5–5.1)
Sodium: 143 mmol/L (ref 135–145)
Total Bilirubin: 1 mg/dL (ref 0.0–1.2)
Total Protein: 6.2 g/dL — ABNORMAL LOW (ref 6.5–8.1)

## 2023-11-30 LAB — RAPID URINE DRUG SCREEN, HOSP PERFORMED
Amphetamines: NOT DETECTED
Barbiturates: NOT DETECTED
Benzodiazepines: NOT DETECTED
Cocaine: NOT DETECTED
Opiates: NOT DETECTED
Tetrahydrocannabinol: POSITIVE — AB

## 2023-11-30 LAB — CBC
HCT: 41.1 % (ref 36.0–46.0)
Hemoglobin: 14.2 g/dL (ref 12.0–15.0)
MCH: 32 pg (ref 26.0–34.0)
MCHC: 34.5 g/dL (ref 30.0–36.0)
MCV: 92.6 fL (ref 80.0–100.0)
Platelets: 267 K/uL (ref 150–400)
RBC: 4.44 MIL/uL (ref 3.87–5.11)
RDW: 13.2 % (ref 11.5–15.5)
WBC: 6.2 K/uL (ref 4.0–10.5)
nRBC: 0 % (ref 0.0–0.2)

## 2023-11-30 LAB — FOLATE: Folate: 17.6 ng/mL (ref >5.9)

## 2023-11-30 LAB — VITAMIN B12: Vitamin B-12: 658 pg/mL (ref 180–914)

## 2023-11-30 MED ORDER — IPRATROPIUM-ALBUTEROL 0.5-2.5 (3) MG/3ML IN SOLN
3.0000 mL | Freq: Four times a day (QID) | RESPIRATORY_TRACT | Status: DC
  Administered 2023-11-30 – 2023-12-01 (×5): 3 mL via RESPIRATORY_TRACT
  Filled 2023-11-30 (×5): qty 3

## 2023-11-30 MED ORDER — SODIUM CHLORIDE 0.9 % IV SOLN
INTRAVENOUS | Status: AC
Start: 2023-11-30 — End: 2023-12-01

## 2023-11-30 MED ORDER — SODIUM CHLORIDE 0.9 % IV SOLN
12.5000 mg | Freq: Three times a day (TID) | INTRAVENOUS | Status: DC | PRN
  Administered 2023-11-30: 12.5 mg via INTRAVENOUS
  Filled 2023-11-30: qty 0.5

## 2023-11-30 MED ORDER — ACETAMINOPHEN 325 MG PO TABS
650.0000 mg | ORAL_TABLET | Freq: Four times a day (QID) | ORAL | Status: DC | PRN
  Administered 2023-11-30 – 2023-12-01 (×2): 650 mg via ORAL
  Filled 2023-11-30 (×2): qty 2

## 2023-11-30 MED ORDER — MORPHINE SULFATE (PF) 2 MG/ML IV SOLN
1.0000 mg | INTRAVENOUS | Status: AC | PRN
  Administered 2023-11-30 (×2): 1 mg via INTRAVENOUS
  Filled 2023-11-30 (×2): qty 1

## 2023-11-30 MED ORDER — THIAMINE MONONITRATE 100 MG PO TABS
200.0000 mg | ORAL_TABLET | Freq: Every day | ORAL | Status: DC
  Administered 2023-11-30 – 2023-12-01 (×2): 200 mg via ORAL
  Filled 2023-11-30 (×2): qty 2

## 2023-11-30 MED ORDER — ALPRAZOLAM 0.25 MG PO TABS
0.2500 mg | ORAL_TABLET | Freq: Three times a day (TID) | ORAL | Status: DC | PRN
  Administered 2023-11-30 (×2): 0.25 mg via ORAL
  Filled 2023-11-30 (×2): qty 1

## 2023-11-30 NOTE — Plan of Care (Signed)

## 2023-11-30 NOTE — Progress Notes (Signed)
SLP Cancellation Note  Patient Details Name: [name] MRN: [id number] DOB: 08/23/1956   Cancelled treatment:       Reason Eval/Treat Not Completed: Patient at procedure or test/unavailable; pt with RT and then PT when SLE attempted; ST will continue efforts prn.   Pat Vernella Niznik,M.S.,CCC-SLP 11/30/2023, 10:16 AM

## 2023-11-30 NOTE — Evaluation (Signed)
Occupational Therapy Evaluation Patient Details Name: Teresa Lang MRN: [id number] DOB: 05-Apr-1956 Today's Date: 11/30/2023   History of Present Illness   Teresa Lang is a 68 y.o. female presenting with encephalopathy, recurrent vomiting. History primarily from family. Per report, pt w/ worsening confusion over past 12-24 hours. Also w/ recurrent episodes of emesis. medical history significant of COPD, asthma, rheumatoid arthitis, GERD, HTN, HLD.     Clinical Impressions Pt in tears upon OT's arrival, decreased problem solving, assisted to call nurse as nausea remains persistent. Hand held assist to ambulate to bathroom. Pt needs up to min assist for ADLs. Pt is not a reliable historian. Per sister, pt lives with her granddaughter, furniture walks and completes ADLs and light meal prep independently. Unclear who manages pt's medications, per chart pt had run out. Recommending HHOT and HHSW upon discharge.      If plan is discharge home, recommend the following:   A little help with walking and/or transfers;A little help with bathing/dressing/bathroom;Assistance with cooking/housework;Assist for transportation;Help with stairs or ramp for entrance     Functional Status Assessment   Patient has had a recent decline in their functional status and demonstrates the ability to make significant improvements in function in a reasonable and predictable amount of time.     Equipment Recommendations   Other (comment) (defer bathroom equipment to [facility])     Recommendations for Other Services         Precautions/Restrictions   Precautions Precautions: Fall Recall of Precautions/Restrictions: Impaired Restrictions Weight Bearing Restrictions Per Provider Order: No     Mobility Bed Mobility Overal bed mobility: Modified Independent                  Transfers Overall transfer level: Needs assistance Equipment used: 1 person hand held  assist Transfers: Sit to/from Stand Sit to Stand: Supervision                  Balance Overall balance assessment: Needs assistance Sitting-balance support: Feet supported Sitting balance-Leahy Scale: Good     Standing balance support: During functional activity Standing balance-Leahy Scale: Poor Standing balance comment: reliant on one hand assist for balance                           ADL either performed or assessed with clinical judgement   ADL Overall ADL's : Needs assistance/impaired Eating/Feeding: Set up;Bed level   Grooming: Wash/dry hands;Wash/dry face;Sitting;Set up   Upper Body Bathing: Minimal assistance;Sitting   Lower Body Bathing: Minimal assistance;Sit to/from stand   Upper Body Dressing : Set up;Sitting   Lower Body Dressing: Minimal assistance;Sit to/from stand   Toilet Transfer: Minimal assistance;Ambulation Toilet Transfer Details (indicate cue type and reason): hand held assist to bathroom Toileting- Clothing Manipulation and Hygiene: Set up;Sitting/lateral lean       Functional mobility during ADLs: Minimal assistance (hand held) General ADL Comments: pt distracted by nausea     Vision Ability to See in Adequate Light: 0 Adequate Patient Visual Report: No change from baseline       Perception         Praxis         Pertinent Vitals/Pain Pain Assessment Pain Assessment: No/denies pain     Extremity/Trunk Assessment Upper Extremity Assessment Upper Extremity Assessment: Generalized weakness   Lower Extremity Assessment Lower Extremity Assessment: Defer to PT evaluation   Cervical / Trunk Assessment Cervical / Trunk Assessment: Normal   Communication  Communication Communication: Impaired Factors Affecting Communication: Difficulty expressing self   Cognition Arousal: Alert Behavior During Therapy: Anxious Cognition: Cognition impaired   Orientation impairments: Time Awareness: Online awareness impaired,  Intellectual awareness intact Memory impairment (select all impairments): Short-term memory, Working memory Attention impairment (select first level of impairment): Sustained attention Executive functioning impairment (select all impairments): Initiation, Sequencing, Problem solving OT - Cognition Comments: Pt in room crying with ongoing nausea, did not initiate calling for help.                 Following commands: Impaired Following commands impaired: Follows one step commands inconsistently, Follows one step commands with increased time     Cueing  General Comments   Cueing Techniques: Verbal cues      Exercises     Shoulder Instructions      Home Living Family/patient expects to be discharged to:: Private residence Living Arrangements: Other relatives (granddaughter) Available Help at Discharge: Family;Available 24 hours/day Type of Home: Mobile home Home Access: Stairs to enter Entrance Stairs-Number of Steps: 5-6 Entrance Stairs-Rails: Can reach both Home Layout: One level     Bathroom Shower/Tub: Chief Strategy Officer: Standard     Home Equipment: None          Prior Functioning/Environment Prior Level of Function : Independent/Modified Independent             Mobility Comments: furniture walks, rarely leaves home ADLs Comments: independent in self care    OT Problem List: Decreased strength;Decreased activity tolerance;Impaired balance (sitting and/or standing);Decreased cognition   OT Treatment/Interventions: Self-care/ADL training;DME and/or AE instruction;Therapeutic activities;Patient/family education;Balance training;Cognitive remediation/compensation      OT Goals(Current goals can be found in the care plan section)   Acute Rehab OT Goals OT Goal Formulation: With patient Time For Goal Achievement: 12/14/23 Potential to Achieve Goals: Good ADL Goals Pt Will Perform Grooming: with supervision;standing Pt Will Perform  Lower Body Bathing: with supervision;sit to/from stand Pt Will Perform Lower Body Dressing: with supervision;sit to/from stand Pt Will Transfer to Toilet: with supervision;ambulating;regular height toilet Pt Will Perform Toileting - Clothing Manipulation and hygiene: with supervision;sit to/from stand Additional ADL Goal #1: Pt will participate in pill box cognitive assessment.   OT Frequency:  Min 1X/week    Co-evaluation              AM-PAC OT "6 Clicks" Daily Activity     Outcome Measure Help from another person eating meals?: None Help from another person taking care of personal grooming?: A Little Help from another person toileting, which includes using toliet, bedpan, or urinal?: A Little Help from another person bathing (including washing, rinsing, drying)?: A Little Help from another person to put on and taking off regular upper body clothing?: A Little Help from another person to put on and taking off regular lower body clothing?: A Little 6 Click Score: 19   End of Session Equipment Utilized During Treatment: Gait belt Nurse Communication: Other (comment) (nausea persists)  Activity Tolerance: Treatment limited secondary to medical complications (Comment) (nausea) Patient left: in bed;with call bell/phone within reach;with bed alarm set  OT Visit Diagnosis: Unsteadiness on feet (R26.81);Other abnormalities of gait and mobility (R26.89);Other symptoms and signs involving cognitive function;Muscle weakness (generalized) (M62.81)                Time: 1610-9604 OT Time Calculation (min): 15 min Charges:  OT General Charges $OT Visit: 1 Visit OT Evaluation $OT Eval Moderate Complexity: 1 Mod  Raynelle Fanning  M, OTR/L Acute Rehabilitation Services Office: [phone]   Evern Bio 11/30/2023, 1:12 PM

## 2023-11-30 NOTE — Evaluation (Signed)
Physical Therapy Evaluation Patient Details Name: Teresa Lang MRN: [id number] DOB: 1956/07/16 Today's Date: 11/30/2023  History of Present Illness  Avacyn Kloosterman is a 68 y.o. female presenting with encephalopathy, recurrent vomiting. History primarily from family. Per report, pt w/ worsening confusion over past 12-24 hours. Also w/ recurrent episodes of emesis. medical history significant of COPD, asthma, rheumatoid arthitis, GERD, HTN, HLD.  Clinical Impression  Pt presents with admitting diagnosis above. Pt today was able to ambulate short distance in room from bathroom back to bed with CGA no AD. Pt noted to furniture walk and did have 1 LOB requiring CGA to correct. History obtained by sister who was present as pt was not a reliable historian however pt sister reports that she was mostly independent PTA however rarely left the house unless it was for a doctors appointment. Recommend HHPT with RW upon DC once pt cognition has improved. PT will continue to follow.         If plan is discharge home, recommend the following: A little help with walking and/or transfers;A little help with bathing/dressing/bathroom;Assistance with cooking/housework;Direct supervision/assist for medications management;Assist for transportation;Direct supervision/assist for financial management;Help with stairs or ramp for entrance;Supervision due to cognitive status   Can travel by private vehicle        Equipment Recommendations Rolling walker (2 wheels)  Recommendations for Other Services       Functional Status Assessment Patient has had a recent decline in their functional status and demonstrates the ability to make significant improvements in function in a reasonable and predictable amount of time.     Precautions / Restrictions Precautions Precautions: Fall Recall of Precautions/Restrictions: Impaired Restrictions Weight Bearing Restrictions Per Provider Order: No       Mobility  Bed Mobility               General bed mobility comments: Received in bathroom and left seated EOB with nursing staff.    Transfers Overall transfer level: Needs assistance Equipment used: None Transfers: Sit to/from Stand Sit to Stand: Supervision                Ambulation/Gait Ambulation/Gait assistance: Contact guard assist Gait Distance (Feet): 15 Feet Assistive device: None Gait Pattern/deviations: Drifts right/left, Narrow base of support, Decreased stride length, Step-through pattern Gait velocity: decreased     General Gait Details: pt noted to furniture walk back from bathroom. Pt with 1 LOB requiring CGA to correct. Pt states that she recently has been needing to use furniture to ambulate.  Stairs            Wheelchair Mobility     Tilt Bed    Modified Rankin (Stroke Patients Only)       Balance Overall balance assessment: Needs assistance Sitting-balance support: Bilateral upper extremity supported Sitting balance-Leahy Scale: Good     Standing balance support: Bilateral upper extremity supported, During functional activity Standing balance-Leahy Scale: Poor Standing balance comment: Reliant on external support                             Pertinent Vitals/Pain Pain Assessment Pain Assessment: No/denies pain    Home Living Family/patient expects to be discharged to:: Private residence Living Arrangements: Other relatives (Granddaughter) Available Help at Discharge: Family;Available 24 hours/day Type of Home: Mobile home Home Access: Stairs to enter Entrance Stairs-Rails: Can reach both Entrance Stairs-Number of Steps: 5-6   Home Layout: One level Home Equipment: None  Prior Function Prior Level of Function : Independent/Modified Independent             Mobility Comments: Pt sister reports that she is mostly independent. She doesnt leave the house unless it is for a doctors appointment. Pt  reports furniture walking. ADLs Comments: Ind     Extremity/Trunk Assessment   Upper Extremity Assessment Upper Extremity Assessment: Defer to OT evaluation    Lower Extremity Assessment Lower Extremity Assessment: Generalized weakness    Cervical / Trunk Assessment Cervical / Trunk Assessment: Normal  Communication   Communication Communication: Impaired Factors Affecting Communication: Difficulty expressing self;Reduced clarity of speech    Cognition Arousal: Alert Behavior During Therapy: Anxious   PT - Cognitive impairments: Orientation, Awareness, Problem solving, Safety/Judgement   Orientation impairments: Time, Situation                   PT - Cognition Comments: Pt A&Ox2. Able to tell me her name and birthday but unable to tell me situation, date, or year of birth. Sister was present and provided cueing for cognition. Following commands: Impaired Following commands impaired: Follows one step commands inconsistently, Follows one step commands with increased time     Cueing Cueing Techniques: Verbal cues, Tactile cues     General Comments General comments (skin integrity, edema, etc.): VSS on RA    Exercises     Assessment/Plan    PT Assessment Patient needs continued PT services  PT Problem List Decreased strength;Decreased range of motion;Decreased activity tolerance;Decreased balance;Decreased mobility;Decreased coordination;Decreased knowledge of use of DME;Decreased safety awareness;Decreased knowledge of precautions;Cardiopulmonary status limiting activity       PT Treatment Interventions DME instruction;Gait training;Stair training;Functional mobility training;Therapeutic activities;Therapeutic exercise;Neuromuscular re-education;Balance training;Cognitive remediation;Patient/family education    PT Goals (Current goals can be found in the Care Plan section)  Acute Rehab PT Goals PT Goal Formulation: Patient unable to participate in goal  setting Time For Goal Achievement: 12/14/23 Potential to Achieve Goals: Fair    Frequency Min 1X/week     Co-evaluation               AM-PAC PT "6 Clicks" Mobility  Outcome Measure Help needed turning from your back to your side while in a flat bed without using bedrails?: None Help needed moving from lying on your back to sitting on the side of a flat bed without using bedrails?: None Help needed moving to and from a bed to a chair (including a wheelchair)?: A Little Help needed standing up from a chair using your arms (e.g., wheelchair or bedside chair)?: A Little Help needed to walk in hospital room?: A Little Help needed climbing 3-5 steps with a railing? : A Lot 6 Click Score: 19    End of Session   Activity Tolerance: Patient tolerated treatment well;Other (comment) (limited by nausea) Patient left: in bed;with call bell/phone within reach;with nursing/sitter in room;with family/visitor present Nurse Communication: Mobility status PT Visit Diagnosis: Other abnormalities of gait and mobility (R26.89)    Time: 1610-9604 PT Time Calculation (min) (ACUTE ONLY): 25 min   Charges:   PT Evaluation $PT Eval Moderate Complexity: 1 Mod PT Treatments $Therapeutic Activity: 8-22 mins PT General Charges $$ ACUTE PT VISIT: 1 Visit         Shela Nevin, PT, DPT Acute Rehab Services [phone]   Gladys Damme 11/30/2023, 10:58 AM

## 2023-11-30 NOTE — TOC Progression Note (Signed)
Transition of Care [facility]) - Progression Note    Patient Details  Name: Teresa Lang MRN: [id number] Date of Birth: 1956-03-23  Transition [facility] CM/SW Contact  Gordy Clement, RN Phone Number: 11/30/2023, 3:49 PM  Clinical Narrative:     Patient being recommended a rolling walker  [employer] to deliver bedside  Home Health PT and OT have also been recommended and will be provided by [facility]   AVS updated   Family will provide transportation at DC.          Expected Discharge Plan and Services                                               Social Determinants of Health (SDOH) Interventions SDOH Screenings   Food Insecurity: Patient Unable To Answer (11/29/2023)  Housing: Patient Unable To Answer (11/29/2023)  Transportation Needs: Patient Unable To Answer (11/29/2023)  Utilities: Patient Unable To Answer (11/29/2023)  Alcohol Screen: Low Risk  (03/20/2019)  Depression (PHQ2-9): High Risk (09/08/2022)  Financial Resource Strain: Low Risk  (07/09/2023)   Received from [facility]  Social Connections: Patient Unable To Answer (11/29/2023)  Tobacco Use: High Risk (11/29/2023)    Readmission Risk Interventions     No data to display

## 2023-11-30 NOTE — Progress Notes (Signed)
PROGRESS NOTE    Teresa Lang  BMW:[id number] DOB: [birth year] DOA: 11/29/2023 PCP: Patrice Paradise, MD     Chief Complaint  Patient presents with   Altered Mental Status    Brief Narrative:   Teresa Lang is a 68 y.o. female with medical history significant of COPD, asthma, rheumatoid arthitis, GERD, HTN, HLD presenting with encephalopathy, recurrent vomiting. History primarily from family. Per report, pt w/ worsening confusion over past 12-24 hours. Also w/ recurrent episodes of emesis. Emesis non bloody. ? Bilious etiology. Had worsening confusion w/ emesis. No reports of chest pain,SOB, cough. Minimal to mild abdominal pain. No focal hemiparesis or confusion. Some concern for headache with family. Family reports similar episode 2-3 years ago assd with patient missing medication. There was also concern for subclinical dementia during that evaluation. Per the family, the patient has been out of at least half  of her medications for several months secondary to cost.  Presented to ER afebrile, hemodynamically stable. Satting well on RA. WBC 8.7, hgb 15.3, plt 305, VBG WNL. Lactate stable. Ct 0.87. CT head WNL. UA still pending. CXR and CT A&P were not ordered as part of initial evaluation.   Assessment & Plan:   Principal Problem:   Encephalopathy Active Problems:   Vomiting   Fibromyalgia   Hypertension, essential   Rheumatoid arthritis involving multiple sites with positive rheumatoid factor (HCC)   Chronic obstructive pulmonary disease, unspecified (HCC)   Opioid use disorder, severe, in sustained remission (HCC)   Acute toxic encephalopathy -She presents with altered mental status, somnolent. -This is most likely due to [facility] use -MRI brain with no acute findings -Mentation has been improving, she is more awake and communicative, but remains confused, sister at baseline, patient much improved since yesterday, but still far from her baseline  Nausea,  vomiting, abdominal pain -Patient with significant nausea and vomiting, remains significant this morning, this is most likely due to cannabinoid hyperemesis syndrome -Continue with Zofran, will add Phenergan - Continue with IV fluids  -Will start on liquid diet   Opioid use disorder, severe, in sustained remission (HCC) UDS for opiates, but significant for THC  Chronic obstructive pulmonary disease, unspecified (HCC) Appears fairly stable from a resp standpoint  VBG WNL  Cont home inhalers  Encouraged use incentive spirometry and flutter valve     Rheumatoid arthritis involving multiple sites with positive rheumatoid factor (HCC) Cont plaquenil   Hypertension, essential BP stable  Titrate home regimen      Fibromyalgia Previously prescribed baclofen and cymbalta- has not had medication for several months per family  Cont to hold for now Monitor         DVT prophylaxis: Lovenox Code Status: Full code Family Communication: Discussed with sister at bedside Disposition:   Status is: Inpatient    Consultants:  None   Subjective:  Gentleman with significant nausea, vomiting despite receiving Zofran  Objective: Vitals:   11/29/23 2000 11/29/23 2358 11/30/23 0347 11/30/23 1100  BP:  (!) 141/67 135/84 (!) 154/89  Pulse:  87 91 (!) 104  Resp:  14 17 18   Temp: 97.9 F (36.6 C) 97.7 F (36.5 C) 97.6 F (36.4 C) 98.9 F (37.2 C)  TempSrc: Oral Axillary Oral Oral  SpO2:  90% 90% 94%  Height:        Intake/Output Summary (Last 24 hours) at 11/30/2023 1359 Last data filed at 11/30/2023 1200 Gross per 24 hour  Intake 921.1 ml  Output 600 ml  Net 321.1  ml   There were no vitals filed for this visit.  Examination:  Awake Alert, she is more appropriate, oriented x 2, remains confused, not back to baseline as discussed with sister at bedside, appears to be restless, uncomfortable belching and vomiting when I was in the room, ill-appearing, appears older than  stated age Symmetrical Chest wall movement, Good air movement bilaterally, CTAB RRR,No Gallops,Rubs or new Murmurs, No Parasternal Heave +ve B.Sounds, Abd Soft, No tenderness, No rebound - guarding or rigidity. No Cyanosis, Clubbing or edema, No new Rash or bruise      Data Reviewed: I have personally reviewed following labs and imaging studies  CBC: Recent Labs  Lab 11/29/23 1356 11/29/23 1413 11/30/23 0414  WBC 8.7  --  6.2  NEUTROABS 6.0  --   --   HGB 15.3* 15.3* 14.2  HCT 45.2 45.0 41.1  MCV 93.2  --  92.6  PLT 305  --  267    Basic Metabolic Panel: Recent Labs  Lab 11/29/23 1356 11/29/23 1413 11/30/23 0414  NA 143 142 143  K 4.1 3.8 3.5  CL 106  --  109  CO2 25  --  24  GLUCOSE 118*  --  98  BUN 18  --  13  CREATININE 0.87  --  0.70  CALCIUM 9.6  --  9.1  MG 2.1  --   --     GFR: CrCl cannot be calculated (Unknown ideal weight.).  Liver Function Tests: Recent Labs  Lab 11/29/23 1356 11/30/23 0414  AST 30 24  ALT 66* 50*  ALKPHOS 73 60  BILITOT 1.1 1.0  PROT 7.0 6.2*  ALBUMIN 3.6 3.0*    CBG: Recent Labs  Lab 11/29/23 1335  GLUCAP 105*     No results found for this or any previous visit (from the past 240 hours).       Radiology Studies: MR BRAIN WO CONTRAST Result Date: 11/29/2023 CLINICAL DATA:  Acute neurologic deficit EXAM: MRI HEAD WITHOUT CONTRAST TECHNIQUE: Multiplanar, multiecho pulse sequences of the brain and surrounding structures were obtained without intravenous contrast. COMPARISON:  06/25/2022 FINDINGS: Brain: No acute infarct, mass effect or extra-axial collection. No acute or chronic hemorrhage. There is multifocal hyperintense T2-weighted signal within the white matter. Parenchymal volume and CSF spaces are normal. The midline structures are normal. Vascular: Normal flow voids. Skull and upper cervical spine: Normal calvarium and skull base. Visualized upper cervical spine and soft tissues are normal. Sinuses/Orbits:Left  maxillary and sphenoid sinusitis. IMPRESSION: 1. No acute intracranial abnormality. 2. Findings of chronic small vessel ischemia. 3. Left maxillary and sphenoid sinusitis. Electronically Signed   By: Deatra Robinson M.D.   On: 11/29/2023 22:25   CT ABDOMEN PELVIS W CONTRAST Result Date: 11/29/2023 CLINICAL DATA:  Abdominal pain, acute, nonlocalized. EXAM: CT ABDOMEN AND PELVIS WITH CONTRAST TECHNIQUE: Multidetector CT imaging of the abdomen and pelvis was performed using the standard protocol following bolus administration of intravenous contrast. RADIATION DOSE REDUCTION: This exam was performed according to the departmental dose-optimization program which includes automated exposure control, adjustment of the mA and/or kV according to patient size and/or use of iterative reconstruction technique. CONTRAST:  75mL OMNIPAQUE IOHEXOL 350 MG/ML SOLN COMPARISON:  08/31/2020. FINDINGS: Lower chest: A small pericardial effusion is noted. Hepatobiliary: No focal liver abnormality is seen. Status post cholecystectomy with stable biliary ductal dilatation. Pancreas: Pancreatic atrophy is noted. No pancreatic ductal dilatation or surrounding inflammatory changes. Spleen: Normal in size without focal abnormality. Adrenals/Urinary Tract: The adrenal  glands are within normal limits. The kidneys enhance symmetrically. Cyst is present in the upper pole of the left kidney. No renal calculus or hydronephrosis bilaterally. The visualized portion of the bladder is within normal limits. Evaluation is limited due to streak hardware artifact. Stomach/Bowel: Stomach is within normal limits. Appendix appears normal. No evidence of bowel wall thickening, distention, or inflammatory changes. No free air or pneumatosis is seen. Scattered diverticula are present along the colon without evidence of diverticulitis. Vascular/Lymphatic: Aortic atherosclerosis. No enlarged abdominal or pelvic lymph nodes. Reproductive: Status post hysterectomy. No  adnexal masses. Other: No abdominopelvic ascites. Air is noted in the subcutaneous fat in the mid right abdomen, likely iatrogenic. Musculoskeletal: Total hip arthroplasty changes are present on the right. There are degenerative changes in the thoracolumbar spine. No acute osseous abnormality is seen. IMPRESSION: 1. No acute intra-abdominal process. 2. Diverticulosis without diverticulitis. 3. Aortic atherosclerosis. Electronically Signed   By: Thornell Sartorius M.D.   On: 11/29/2023 21:53   DG CHEST PORT 1 VIEW Result Date: 11/29/2023 CLINICAL DATA:  Encephalopathy EXAM: PORTABLE CHEST 1 VIEW COMPARISON:  04/03/2022, CT 04/03/2022 FINDINGS: Hyperinflation with emphysema and bronchitic changes. No focal airspace disease or effusion. Stable cardiomediastinal silhouette. No pneumothorax. IMPRESSION: No active disease. Hyperinflation with emphysema and bronchitic changes. Electronically Signed   By: Jasmine Pang M.D.   On: 11/29/2023 17:35   CT Head Wo Contrast Result Date: 11/29/2023 CLINICAL DATA:  Altered mental status. EXAM: CT HEAD WITHOUT CONTRAST TECHNIQUE: Contiguous axial images were obtained from the base of the skull through the vertex without intravenous contrast. RADIATION DOSE REDUCTION: This exam was performed according to the departmental dose-optimization program which includes automated exposure control, adjustment of the mA and/or kV according to patient size and/or use of iterative reconstruction technique. COMPARISON:  Head CT dated 08/19/2019. FINDINGS: Brain: The ventricles and sulci are appropriate size for the patient's age. The gray-white matter discrimination is preserved. There is no acute intracranial hemorrhage. No mass effect or midline shift. No extra-axial fluid collection. Vascular: No hyperdense vessel or unexpected calcification. Skull: Normal. Negative for fracture or focal lesion. Sinuses/Orbits: Near complete opacification of the left maxillary sinus. No air-fluid. The  visualized paranasal sinuses and mastoid air cells are clear. Other: None IMPRESSION: 1. No acute intracranial pathology. 2. Left maxillary sinus disease. Electronically Signed   By: Elgie Collard M.D.   On: 11/29/2023 14:33        Scheduled Meds:  amoxicillin-clavulanate  1 tablet Oral Q12H   enoxaparin (LOVENOX) injection  40 mg Subcutaneous Q24H   hydroxychloroquine  200 mg Oral Daily   ipratropium-albuterol  3 mL Nebulization QID   pantoprazole (PROTONIX) IV  40 mg Intravenous Q12H   thiamine  200 mg Oral Daily   Continuous Infusions:  sodium chloride 100 mL/hr at 11/30/23 1055   promethazine (PHENERGAN) injection (IM or IVPB) 12.5 mg (11/30/23 1031)     LOS: 0 days      Huey Bienenstock, MD [facility]   To contact the attending provider between 7A-7P or the covering provider during after hours 7P-7A, please log into the web site [email or web address] and access using universal [facility] password for that web site. If you do not have the password, please call the hospital operator.  11/30/2023, 1:59 PM

## 2023-12-01 ENCOUNTER — Other Ambulatory Visit (HOSPITAL_COMMUNITY): Payer: Self-pay

## 2023-12-01 DIAGNOSIS — G9340 Encephalopathy, unspecified: Secondary | ICD-10-CM | POA: Diagnosis not present

## 2023-12-01 LAB — BASIC METABOLIC PANEL
Anion gap: 9 (ref 5–15)
BUN: 6 mg/dL — ABNORMAL LOW (ref 8–23)
CO2: 25 mmol/L (ref 22–32)
Calcium: 8.9 mg/dL (ref 8.9–10.3)
Chloride: 107 mmol/L (ref 98–111)
Creatinine, Ser: 0.63 mg/dL (ref 0.44–1.00)
GFR, Estimated: >60 mL/min (ref >60)
Glucose, Bld: 90 mg/dL (ref 70–99)
Potassium: 3.4 mmol/L — ABNORMAL LOW (ref 3.5–5.1)
Sodium: 141 mmol/L (ref 135–145)

## 2023-12-01 LAB — CBC
HCT: 42.6 % (ref 36.0–46.0)
Hemoglobin: 13.9 g/dL (ref 12.0–15.0)
MCH: 31.2 pg (ref 26.0–34.0)
MCHC: 32.6 g/dL (ref 30.0–36.0)
MCV: 95.7 fL (ref 80.0–100.0)
Platelets: 248 10*3/uL (ref 150–400)
RBC: 4.45 MIL/uL (ref 3.87–5.11)
RDW: 13.2 % (ref 11.5–15.5)
WBC: 5.9 10*3/uL (ref 4.0–10.5)
nRBC: 0 % (ref 0.0–0.2)

## 2023-12-01 LAB — PHOSPHORUS: Phosphorus: 3.5 mg/dL (ref 2.5–4.6)

## 2023-12-01 LAB — MAGNESIUM: Magnesium: 1.9 mg/dL (ref 1.7–2.4)

## 2023-12-01 MED ORDER — POTASSIUM CHLORIDE CRYS ER 20 MEQ PO TBCR
40.0000 meq | EXTENDED_RELEASE_TABLET | Freq: Once | ORAL | Status: AC
  Administered 2023-12-01: 40 meq via ORAL
  Filled 2023-12-01: qty 2

## 2023-12-01 MED ORDER — LISINOPRIL 20 MG PO TABS: 20.0000 mg | ORAL_TABLET | Freq: Every day | ORAL | Status: DC

## 2023-12-01 MED ORDER — METOPROLOL TARTRATE 25 MG PO TABS: 25.0000 mg | ORAL_TABLET | Freq: Two times a day (BID) | ORAL | Status: AC

## 2023-12-01 MED ORDER — GUAIFENESIN ER 600 MG PO TB12
600.0000 mg | ORAL_TABLET | Freq: Two times a day (BID) | ORAL | 0 refills | Status: AC
  Filled 2023-12-01: qty 14, 7d supply, fill #0

## 2023-12-01 MED ORDER — AMOXICILLIN-POT CLAVULANATE 875-125 MG PO TABS
1.0000 | ORAL_TABLET | Freq: Two times a day (BID) | ORAL | 0 refills | Status: AC
  Filled 2023-12-01: qty 10, 5d supply, fill #0

## 2023-12-01 MED ORDER — HYDROMORPHONE HCL 1 MG/ML IJ SOLN
0.5000 mg | Freq: Once | INTRAMUSCULAR | Status: AC
  Administered 2023-12-01: 0.5 mg via INTRAVENOUS
  Filled 2023-12-01: qty 0.5

## 2023-12-01 NOTE — Discharge Summary (Signed)
 Physician Discharge Summary  Teresa Lang ZOX:[id number] DOB: 06-21-1956 DOA: 11/29/2023  PCP: Patrice Paradise, MD  Admit date: 11/29/2023 Discharge date: 12/01/2023  Admitted From: (Home) Disposition:  (Home )  Recommendations for Outpatient Follow-up:  Follow up with PCP in 1-2 weeks Please obtain BMP/CBC in one week Please adjust antihypertensive medication as needed   Diet recommendation: Heart Healthy   Brief/Interim Summary: Teresa Lang is a 68 y.o. female with medical history significant of COPD, asthma, rheumatoid arthitis, GERD, HTN, HLD presenting with encephalopathy, recurrent vomiting. History primarily from family. Per report, pt w/ worsening confusion over past 12-24 hours. Also w/ recurrent episodes of emesis. Emesis non bloody. ? Bilious etiology. Had worsening confusion w/ emesis. No reports of chest pain,SOB, cough. Minimal to mild abdominal pain. No focal hemiparesis or confusion. Some concern for headache with family. Family reports similar episode 2-3 years ago assd with patient missing medication. There was also concern for subclinical dementia during that evaluation. Per the family, the patient has been out of at least half  of her medications for several months secondary to cost.  Presented to ER afebrile, hemodynamically stable. Satting well on RA. WBC 8.7, hgb 15.3, plt 305, VBG WNL. Lactate stable. Ct 0.87. CT head WNL.  Right brain significant for sinusitis, UA is negative, CT abdomen pelvis with no acute findings, her urine drug screen positive for THC.     Acute toxic encephalopathy -She presents with altered mental status, somnolent. -This is most likely due to [facility] use -MRI brain with no acute findings -Patient improving gradually during hospital stay, this morning she is back to baseline    Nausea, vomiting, abdominal pain> likely cannabinoid hyperemesis syndrome -Patient with significant nausea and vomiting, CT abdomen pelvis with  no acute findings, patient with significant symptoms kept on both Phenergan and Zofran, started on clear liquid diet yesterday, overall symptoms has improved. -He was counseled to avoid THC    Opioid use disorder, severe, in sustained remission (HCC) UDS negative for opiates, but significant for THC   Chronic obstructive pulmonary disease, unspecified (HCC) Appears fairly stable from a resp standpoint  VBG WNL  Cont home inhalers  Encouraged use incentive spirometry and flutter valve     Rheumatoid arthritis involving multiple sites with positive rheumatoid factor (HCC) Cont plaquenil   Hypertension, essential Juliane blood pressure has been acceptable off medication, but started to increase prior to discharge, see if she was encouraged to resume her meds gradually over the next week tell her fluid and oral intake at baseline.     Fibromyalgia Continue with home regimen   Sinusitis -Continue with Augmentin      Discharge Diagnoses:  Principal Problem:   Encephalopathy Active Problems:   Vomiting   Fibromyalgia   Hypertension, essential   Rheumatoid arthritis involving multiple sites with positive rheumatoid factor (HCC)   Chronic obstructive pulmonary disease, unspecified (HCC)   Opioid use disorder, severe, in sustained remission [facility])    Discharge Instructions  Discharge Instructions     Diet - low sodium heart healthy   Complete by: As directed    Discharge instructions   Complete by: As directed    Follow with Primary MD Patrice Paradise, MD in 7 days   Get CBC, CMP, checked  by Primary MD next visit.    Activity: As tolerated with Full fall precautions use walker/cane & assistance as needed   Disposition Home    Diet: soft diet   On your next visit  with your primary care physician please Get Medicines reviewed and adjusted.   Please request your Prim.MD to go over all Hospital Tests and Procedure/Radiological results at the follow up, please  get all Hospital records sent to your Prim MD by signing hospital release before you go home.   If you experience worsening of your admission symptoms, develop shortness of breath, life threatening emergency, suicidal or homicidal thoughts you must seek medical attention immediately by calling 911 or calling your MD immediately  if symptoms less severe.  You Must read complete instructions/literature along with all the possible adverse reactions/side effects for all the Medicines you take and that have been prescribed to you. Take any new Medicines after you have completely understood and accpet all the possible adverse reactions/side effects.   Do not drive, operating heavy machinery, perform activities at heights, swimming or participation in water activities or provide baby sitting services if your were admitted for syncope or siezures until you have seen by Primary MD or a Neurologist and advised to do so again.  Do not drive when taking Pain medications.    Do not take more than prescribed Pain, Sleep and Anxiety Medications  Special Instructions: If you have smoked or chewed Tobacco  in the last 2 yrs please stop smoking, stop any regular Alcohol  and or any Recreational drug use.  Wear Seat belts while driving.   Please note  You were cared for by a hospitalist during your hospital stay. If you have any questions about your discharge medications or the care you received while you were in the hospital after you are discharged, you can call the unit and asked to speak with the hospitalist on call if the hospitalist that took care of you is not available. Once you are discharged, your primary care physician will handle any further medical issues. Please note that NO REFILLS for any discharge medications will be authorized once you are discharged, as it is imperative that you return to your primary care physician (or establish a relationship with a primary care physician if you do not have  one) for your aftercare needs so that they can reassess your need for medications and monitor your lab values.   Increase activity slowly   Complete by: As directed       Allergies as of 12/01/2023       Reactions   Prednisone Anxiety        Medication List     STOP taking these medications    ARIPiprazole 15 MG tablet Commonly known as: ABILIFY   aspirin-acetaminophen-caffeine 250-250-65 MG tablet Commonly known as: EXCEDRIN MIGRAINE       TAKE these medications    albuterol 108 (90 Base) MCG/ACT inhaler Commonly known as: VENTOLIN HFA Inhale 2 puffs into the lungs every 6 (six) hours as needed for wheezing or shortness of breath.   alendronate 70 MG tablet Commonly known as: FOSAMAX Take 70 mg by mouth once a week.   amoxicillin-clavulanate 875-125 MG tablet Commonly known as: AUGMENTIN Take 1 tablet by mouth every 12 (twelve) hours for 5 days.   aspirin EC 81 MG tablet Take 81 mg by mouth.   atorvastatin 20 MG tablet Commonly known as: LIPITOR Take 20 mg by mouth daily.   Baclofen 5 MG Tabs TAKE ONE TABLET BY MOUTH DAILY AT 9AM EVERY MORNING and TAKE TWO TABLETS BY MOUTH DAILY AT 9PM AT BEDTIME   busPIRone 10 MG tablet Commonly known as: BUSPAR Take 10 mg by  mouth 3 (three) times daily.   diltiazem 240 MG 24 hr capsule Commonly known as: CARDIZEM CD Take 240 mg by mouth daily.   DULoxetine 60 MG capsule Commonly known as: CYMBALTA Take 60 mg by mouth daily.   folic acid 1 MG tablet Commonly known as: FOLVITE Take 1 tablet (1 mg total) by mouth daily.   guaiFENesin 600 MG 12 hr tablet Commonly known as: Mucinex Take 1 tablet (600 mg total) by mouth 2 (two) times daily for 7 days.   hydroxychloroquine 200 MG tablet Commonly known as: PLAQUENIL Take 200 mg by mouth daily.   ipratropium-albuterol 0.5-2.5 (3) MG/3ML Soln Commonly known as: DUONEB Inhale 3 mLs into the lungs every 4 (four) hours as needed (wheezing).   lisinopril 20 MG  tablet Commonly known as: ZESTRIL Take 1 tablet (20 mg total) by mouth daily.   methotrexate 2.5 MG tablet Commonly known as: RHEUMATREX Take 17.5 mg by mouth every Sunday.   metoprolol tartrate 25 MG tablet Commonly known as: LOPRESSOR Take 1 tablet (25 mg total) by mouth 2 (two) times daily. Start taking on: December 07, 2023 What changed: These instructions start on December 07, 2023. If you are unsure what to do until then, ask your doctor or other care provider.   omeprazole 40 MG capsule Commonly known as: PRILOSEC Take 40 mg by mouth daily as needed (acid reflux symptoms).   oxybutynin 10 MG 24 hr tablet Commonly known as: DITROPAN-XL Take 1 tablet (10 mg total) by mouth at bedtime.   OXYGEN Inhale into the lungs. PRN   solifenacin 5 MG tablet Commonly known as: VESICARE Take 1 tablet (5 mg total) by mouth daily.   Trelegy Ellipta 100-62.5-25 MCG/INH Aepb Generic drug: Fluticasone-Umeclidin-Vilant Inhale 2 puffs into the lungs daily.        Follow-up Information     [facility] Follow up.   Specialty: Home Health Services Why: Chip Boer will contact you within 48 hours of discharge to arrange a home health visit Contact information: [address] [place] [address] [phone]                Allergies  Allergen Reactions   Prednisone Anxiety    Consultations: None   Procedures/Studies: MR BRAIN WO CONTRAST Result Date: 11/29/2023 CLINICAL DATA:  Acute neurologic deficit EXAM: MRI HEAD WITHOUT CONTRAST TECHNIQUE: Multiplanar, multiecho pulse sequences of the brain and surrounding structures were obtained without intravenous contrast. COMPARISON:  06/25/2022 FINDINGS: Brain: No acute infarct, mass effect or extra-axial collection. No acute or chronic hemorrhage. There is multifocal hyperintense T2-weighted signal within the white matter. Parenchymal volume and CSF spaces are normal. The midline structures are  normal. Vascular: Normal flow voids. Skull and upper cervical spine: Normal calvarium and skull base. Visualized upper cervical spine and soft tissues are normal. Sinuses/Orbits:Left maxillary and sphenoid sinusitis. IMPRESSION: 1. No acute intracranial abnormality. 2. Findings of chronic small vessel ischemia. 3. Left maxillary and sphenoid sinusitis. Electronically Signed   By: Deatra Robinson M.D.   On: 11/29/2023 22:25   CT ABDOMEN PELVIS W CONTRAST Result Date: 11/29/2023 CLINICAL DATA:  Abdominal pain, acute, nonlocalized. EXAM: CT ABDOMEN AND PELVIS WITH CONTRAST TECHNIQUE: Multidetector CT imaging of the abdomen and pelvis was performed using the standard protocol following bolus administration of intravenous contrast. RADIATION DOSE REDUCTION: This exam was performed according to the departmental dose-optimization program which includes automated exposure control, adjustment of the mA and/or kV according to patient size and/or use  of iterative reconstruction technique. CONTRAST:  75mL OMNIPAQUE IOHEXOL 350 MG/ML SOLN COMPARISON:  08/31/2020. FINDINGS: Lower chest: A small pericardial effusion is noted. Hepatobiliary: No focal liver abnormality is seen. Status post cholecystectomy with stable biliary ductal dilatation. Pancreas: Pancreatic atrophy is noted. No pancreatic ductal dilatation or surrounding inflammatory changes. Spleen: Normal in size without focal abnormality. Adrenals/Urinary Tract: The adrenal glands are within normal limits. The kidneys enhance symmetrically. Cyst is present in the upper pole of the left kidney. No renal calculus or hydronephrosis bilaterally. The visualized portion of the bladder is within normal limits. Evaluation is limited due to streak hardware artifact. Stomach/Bowel: Stomach is within normal limits. Appendix appears normal. No evidence of bowel wall thickening, distention, or inflammatory changes. No free air or pneumatosis is seen. Scattered diverticula are present  along the colon without evidence of diverticulitis. Vascular/Lymphatic: Aortic atherosclerosis. No enlarged abdominal or pelvic lymph nodes. Reproductive: Status post hysterectomy. No adnexal masses. Other: No abdominopelvic ascites. Air is noted in the subcutaneous fat in the mid right abdomen, likely iatrogenic. Musculoskeletal: Total hip arthroplasty changes are present on the right. There are degenerative changes in the thoracolumbar spine. No acute osseous abnormality is seen. IMPRESSION: 1. No acute intra-abdominal process. 2. Diverticulosis without diverticulitis. 3. Aortic atherosclerosis. Electronically Signed   By: Thornell Sartorius M.D.   On: 11/29/2023 21:53   DG CHEST PORT 1 VIEW Result Date: 11/29/2023 CLINICAL DATA:  Encephalopathy EXAM: PORTABLE CHEST 1 VIEW COMPARISON:  04/03/2022, CT 04/03/2022 FINDINGS: Hyperinflation with emphysema and bronchitic changes. No focal airspace disease or effusion. Stable cardiomediastinal silhouette. No pneumothorax. IMPRESSION: No active disease. Hyperinflation with emphysema and bronchitic changes. Electronically Signed   By: Jasmine Pang M.D.   On: 11/29/2023 17:35   CT Head Wo Contrast Result Date: 11/29/2023 CLINICAL DATA:  Altered mental status. EXAM: CT HEAD WITHOUT CONTRAST TECHNIQUE: Contiguous axial images were obtained from the base of the skull through the vertex without intravenous contrast. RADIATION DOSE REDUCTION: This exam was performed according to the departmental dose-optimization program which includes automated exposure control, adjustment of the mA and/or kV according to patient size and/or use of iterative reconstruction technique. COMPARISON:  Head CT dated 08/19/2019. FINDINGS: Brain: The ventricles and sulci are appropriate size for the patient's age. The gray-white matter discrimination is preserved. There is no acute intracranial hemorrhage. No mass effect or midline shift. No extra-axial fluid collection. Vascular: No hyperdense  vessel or unexpected calcification. Skull: Normal. Negative for fracture or focal lesion. Sinuses/Orbits: Near complete opacification of the left maxillary sinus. No air-fluid. The visualized paranasal sinuses and mastoid air cells are clear. Other: None IMPRESSION: 1. No acute intracranial pathology. 2. Left maxillary sinus disease. Electronically Signed   By: Elgie Collard M.D.   On: 11/29/2023 14:33      Subjective:  Patient is dressed up with her granddaughter at bedside, eager to go home.  Orts nausea and vomiting has resolved, tolerating oral intake. Discharge Exam: Vitals:   12/01/23 0850 12/01/23 0902  BP: (!) 160/79   Pulse: 82   Resp: 16   Temp: 98.2 F (36.8 C)   SpO2: 96% 97%   Vitals:   11/30/23 2339 12/01/23 0400 12/01/23 0850 12/01/23 0902  BP: 137/75 (!) 156/87 (!) 160/79   Pulse: 85 87 82   Resp: 20 15 16    Temp: 98.3 F (36.8 C) 98.5 F (36.9 C) 98.2 F (36.8 C)   TempSrc: Oral Oral Oral   SpO2: 95% 96% 96% 97%  Height:  General: Pt is alert, awake, not in acute distress, frail Cardiovascular: RRR, S1/S2 +, no rubs, no gallops Respiratory: CTA bilaterally, no wheezing, no rhonchi Abdominal: Soft, NT, ND, bowel sounds + Extremities: no edema, no cyanosis    The results of significant diagnostics from this hospitalization (including imaging, microbiology, ancillary and laboratory) are listed below for reference.     Microbiology: No results found for this or any previous visit (from the past 240 hours).   Labs: BNP (last 3 results) No results for input(s): "BNP" in the last 8760 hours. Basic Metabolic Panel: Recent Labs  Lab 11/29/23 1356 11/29/23 1413 11/30/23 0414 12/01/23 0611  NA 143 142 143 141  K 4.1 3.8 3.5 3.4*  CL 106  --  109 107  CO2 25  --  24 25  GLUCOSE 118*  --  98 90  BUN 18  --  13 6*  CREATININE 0.87  --  0.70 0.63  CALCIUM 9.6  --  9.1 8.9  MG 2.1  --   --  1.9  PHOS  --   --   --  3.5   Liver Function  Tests: Recent Labs  Lab 11/29/23 1356 11/30/23 0414  AST 30 24  ALT 66* 50*  ALKPHOS 73 60  BILITOT 1.1 1.0  PROT 7.0 6.2*  ALBUMIN 3.6 3.0*   Recent Labs  Lab 11/29/23 1356  LIPASE 23   Recent Labs  Lab 11/29/23 1339  AMMONIA 17   CBC: Recent Labs  Lab 11/29/23 1356 11/29/23 1413 11/30/23 0414 12/01/23 0611  WBC 8.7  --  6.2 5.9  NEUTROABS 6.0  --   --   --   HGB 15.3* 15.3* 14.2 13.9  HCT 45.2 45.0 41.1 42.6  MCV 93.2  --  92.6 95.7  PLT 305  --  267 248   Cardiac Enzymes: No results for input(s): "CKTOTAL", "CKMB", "CKMBINDEX", "TROPONINI" in the last 168 hours. BNP: Invalid input(s): "POCBNP" CBG: Recent Labs  Lab 11/29/23 1335  GLUCAP 105*   D-Dimer No results for input(s): "DDIMER" in the last 72 hours. Hgb A1c No results for input(s): "HGBA1C" in the last 72 hours. Lipid Profile No results for input(s): "CHOL", "HDL", "LDLCALC", "TRIG", "CHOLHDL", "LDLDIRECT" in the last 72 hours. Thyroid function studies No results for input(s): "TSH", "T4TOTAL", "T3FREE", "THYROIDAB" in the last 72 hours.  Invalid input(s): "FREET3" Anemia work up Recent Labs    11/29/23 2149 11/30/23 0414  VITAMINB12 658  --   FOLATE  --  17.6   Urinalysis    Component Value Date/Time   COLORURINE YELLOW 11/30/2023 0350   APPEARANCEUR CLEAR 11/30/2023 0350   APPEARANCEUR Clear 05/14/2023 0816   LABSPEC 1.045 (H) 11/30/2023 0350   PHURINE 6.0 11/30/2023 0350   GLUCOSEU NEGATIVE 11/30/2023 0350   HGBUR NEGATIVE 11/30/2023 0350   BILIRUBINUR NEGATIVE 11/30/2023 0350   BILIRUBINUR Negative 05/14/2023 0816   KETONESUR 5 (A) 11/30/2023 0350   PROTEINUR 30 (A) 11/30/2023 0350   NITRITE NEGATIVE 11/30/2023 0350   LEUKOCYTESUR NEGATIVE 11/30/2023 0350   Sepsis Labs Recent Labs  Lab 11/29/23 1356 11/30/23 0414 12/01/23 0611  WBC 8.7 6.2 5.9   Microbiology No results found for this or any previous visit (from the past 240 hours).   Time coordinating  discharge: Over 30 minutes  SIGNED:   Huey Bienenstock, MD  [facility] 12/01/2023, 4:00 PM Pager   If 7PM-7AM, please contact night-coverage [email or web address] Password [email or web address]

## 2023-12-01 NOTE — Progress Notes (Signed)
 Discharge paperwork reviewed with patient at this time and daughter by the bedside. IV dilaudid for pain administered per patient request and MD order. IV has now been removed. Wheelchair access used to transfer patient ot vehicle.

## 2023-12-01 NOTE — TOC Transition Note (Signed)
 Transition of Care [facility]) - Discharge Note   Patient Details  Name: Teresa Lang MRN: [id number] Date of Birth: [birth year]  [facility] CM/SW Contact:  Lawerance Sabal, RN Phone Number: 12/01/2023, 9:54 AM   Clinical Narrative:     Notified by [facility] that they will delivering RW to room this morning. Order for RW placed Notified [facility] that patient will be discharging today  Final next level of care: Home w Home Health Services Barriers to Discharge: No Barriers Identified   Patient Goals and CMS Choice            Discharge Placement                       Discharge Plan and Services Additional resources added to the After Visit Summary for                  DME Arranged: Walker rolling DME Agency: [employer] Date DME Agency Contacted: 12/01/23 Time DME Agency Contacted: [phone] Representative spoke with at DME Agency: Wendi Maya Agency: [facility] Date [facility] Agency Contacted: 12/01/23 Time HH Agency Contacted: [phone] Representative spoke with at [facility] Agency: Angie  Social Drivers of Health (SDOH) Interventions SDOH Screenings   Food Insecurity: Patient Unable To Answer (11/29/2023)  Housing: Patient Unable To Answer (11/29/2023)  Transportation Needs: Patient Unable To Answer (11/29/2023)  Utilities: Patient Unable To Answer (11/29/2023)  Alcohol Screen: Low Risk  (03/20/2019)  Depression (PHQ2-9): High Risk (09/08/2022)  Financial Resource Strain: Low Risk  (07/09/2023)   Received from [facility]  Social Connections: Patient Unable To Answer (11/29/2023)  Tobacco Use: High Risk (11/29/2023)     Readmission Risk Interventions     No data to display

## 2023-12-01 NOTE — Discharge Instructions (Addendum)
 Follow with Primary MD Patrice Paradise, MD in 7 days   Get CBC, CMP, checked  by Primary MD next visit.    Activity: As tolerated with Full fall precautions use walker/cane & assistance as needed   Disposition Home    Diet: soft diet   On your next visit with your primary care physician please Get Medicines reviewed and adjusted.   Please request your Prim.MD to go over all Hospital Tests and Procedure/Radiological results at the follow up, please get all Hospital records sent to your Prim MD by signing hospital release before you go home.   If you experience worsening of your admission symptoms, develop shortness of breath, life threatening emergency, suicidal or homicidal thoughts you must seek medical attention immediately by calling 911 or calling your MD immediately  if symptoms less severe.  You Must read complete instructions/literature along with all the possible adverse reactions/side effects for all the Medicines you take and that have been prescribed to you. Take any new Medicines after you have completely understood and accpet all the possible adverse reactions/side effects.   Do not drive, operating heavy machinery, perform activities at heights, swimming or participation in water activities or provide baby sitting services if your were admitted for syncope or siezures until you have seen by Primary MD or a Neurologist and advised to do so again.  Do not drive when taking Pain medications.    Do not take more than prescribed Pain, Sleep and Anxiety Medications  Special Instructions: If you have smoked or chewed Tobacco  in the last 2 yrs please stop smoking, stop any regular Alcohol  and or any Recreational drug use.  Wear Seat belts while driving.   Please note  You were cared for by a hospitalist during your hospital stay. If you have any questions about your discharge medications or the care you received while you were in the hospital after you are  discharged, you can call the unit and asked to speak with the hospitalist on call if the hospitalist that took care of you is not available. Once you are discharged, your primary care physician will handle any further medical issues. Please note that NO REFILLS for any discharge medications will be authorized once you are discharged, as it is imperative that you return to your primary care physician (or establish a relationship with a primary care physician if you do not have one) for your aftercare needs so that they can reassess your need for medications and monitor your lab values.

## 2024-01-16 ENCOUNTER — Other Ambulatory Visit: Payer: Self-pay | Admitting: Physician Assistant

## 2024-01-16 DIAGNOSIS — Z1231 Encounter for screening mammogram for malignant neoplasm of breast: Secondary | ICD-10-CM

## 2024-03-20 ENCOUNTER — Ambulatory Visit
Admission: RE | Admit: 2024-03-20 | Discharge: 2024-03-20 | Disposition: A | Discharge status: Home / Self Care | Source: Ambulatory Visit | Attending: Physician Assistant | Admitting: Physician Assistant

## 2024-03-20 DIAGNOSIS — Z1231 Encounter for screening mammogram for malignant neoplasm of breast: Secondary | ICD-10-CM | POA: Insufficient documentation

## 2024-05-05 ENCOUNTER — Other Ambulatory Visit: Payer: Self-pay | Admitting: Internal Medicine

## 2024-05-05 DIAGNOSIS — R0789 Other chest pain: Secondary | ICD-10-CM

## 2024-05-05 DIAGNOSIS — R0602 Shortness of breath: Secondary | ICD-10-CM

## 2024-05-12 ENCOUNTER — Ambulatory Visit: Payer: Self-pay | Admitting: Urology

## 2024-05-12 VITALS — BP 158/95 | HR 81

## 2024-05-12 DIAGNOSIS — N3946 Mixed incontinence: Secondary | ICD-10-CM

## 2024-05-12 LAB — URINALYSIS, COMPLETE
Bilirubin, UA: NEGATIVE
Glucose, UA: NEGATIVE
Ketones, UA: NEGATIVE
Leukocytes,UA: NEGATIVE
Nitrite, UA: NEGATIVE
Protein,UA: NEGATIVE
RBC, UA: NEGATIVE
Specific Gravity, UA: 1.01 (ref 1.005–1.030)
Urobilinogen, Ur: 0.2 mg/dL (ref 0.2–1.0)
pH, UA: 6 (ref 5.0–7.5)

## 2024-05-12 LAB — MICROSCOPIC EXAMINATION: Bacteria, UA: NONE SEEN

## 2024-05-12 MED ORDER — OXYBUTYNIN CHLORIDE ER 10 MG PO TB24: 10.0000 mg | ORAL_TABLET | Freq: Every day | ORAL | 3 refills | Status: AC

## 2024-05-12 MED ORDER — SOLIFENACIN SUCCINATE 5 MG PO TABS: 5.0000 mg | ORAL_TABLET | Freq: Every day | ORAL | 3 refills | Status: AC

## 2024-05-12 NOTE — Progress Notes (Signed)
 05/12/2024 8:29 AM   Teresa Lang [birth year] [id number]  Referring provider: Marikay Eva POUR, PA [address] [facility] [address] [facility] [place]  [place] [address]  No chief complaint on file.   HPI: was consulted to assess the patient is urinary incontinence.  She does not leak with coughing sneezing bending or lifting.  She has urge incontinence.  I think she is small volume bedwetting.  She wears 5 pads a day moderately wet or soaked.   She gets up 4-5 times at night.  She can hold urination for many hours and some days she does not void at all.  She does feel like she needs to urinate first thing in the morning   For 2 months her urine is dark with an odor.   She has had 5 neck operations from previous accident and a hysterectomy is prone to constipation.  Has a smoking history   Patient has urge incontinence.  She voids very infrequently.  She has significant nocturia.  Her urine looked infected today.  She had microscopic hematuria.  The patient is a bit vague and spoke about dark urine.  Hopefully she just has a bladder infection but I I will do a hematuria work-up.     Urine culture positive.  CT scan negative cystoscopy normal   Patient said dark urine went away but she still has urgency incontinence   Reassess in 6 weeks on Myrbetriq 50 mg samples and prescription.  Call if urine culture is positive.  If urine culture positive consider prophylaxis.   Patient switched to oxybutynin  10 mg. Patient 50% improved.  Less frequency.  Less urge incontinence.  Still wearing 3-4 pads a day with variable amount of leakage.  Clinically not infected   On urodynamics she voided less than 20 mL and was catheterized for 300 mL.  Patient's maximum bladder capacity was 513 mL.  Bladder was unstable reaching a pressure of 7 cm of water.  She did not feel the low pressure contraction.  She then had an unstable bladder contraction 26 cm of water and leaked with urgency office  contraction.  She had no stress incontinence with a Valsalva pressure 149 cm of water.  During voluntary voiding she voided 491 mL.  Maximum flow was 26 mils per second.  Maximum voiding pressure 20 cm of water.  Residual was 30 mL.  EMG activity was noted during voiding.  Bladder neck descended 2 cm.  she had a normal flow curve during the voluntary void she had mild bladder trabeculation.     Patient primarily has an overactive bladder.  Like to try 1 more medication.  If this fails I will discussed 3 refractory therapies with her.  She continues to be moderately improved on the oxybutynin  so may combine this with Vesicare  5 mg.  Discussed 3 refractory therapies next visit depending on how she does.  If she does really well I can always stop the oxybutynin    Today Frequency stable.  Dramatic improvement in continence.  She was smiling because she is so happy.  No infections.  Tolerating both medications well   Today When patient stopped her Vesicare  and she started leaking again.  Both medications together worked Agricultural consultant.     Today Urgent continence dramatically better.  No infections.  Frequency stable.   Oxybutynin  Vesicare  90x3 sent to pharmacy and I will see in 1 year   Today Patient was nearly completely continent about a week ago.  Starting to have urge  incontinence this week.  Urine may be a little bit darker but no dysuria or other cystitis symptoms.  She is now on home oxygen for COPD   I thought it was best to renew both prescriptions for now since she did really well over the last year.  She has home oxygen and medical comorbidities.  If things or not improving in the next few months I urged her to come back in for reassessment.  Call if culture positive.  I do not think we should be talk about refractory treatments based upon the 1 week history noted above     Today Very good bladder control with decreased urge incontinence.  Frequency stable.  No infections.  Very  pleased   PMH: Past Medical History:  Diagnosis Date   Allergy    seasonal   Anxiety    Arthritis    Asthma    COPD (chronic obstructive pulmonary disease) (HCC)    Depression    Dyspnea    GERD (gastroesophageal reflux disease)    Hyperlipidemia    Hypertension     Surgical History: Past Surgical History:  Procedure Laterality Date   ABDOMINAL HYSTERECTOMY  1983   CERVICAL DISC SURGERY     CHOLECYSTECTOMY  1983   COLONOSCOPY WITH PROPOFOL  N/A 08/08/2022   Procedure: COLONOSCOPY WITH PROPOFOL ;  Surgeon: Maryruth Ole DASEN, MD;  Location: [facility] ENDOSCOPY;  Service: Endoscopy;  Laterality: N/A;   ESOPHAGOGASTRODUODENOSCOPY N/A 08/08/2022   Procedure: ESOPHAGOGASTRODUODENOSCOPY (EGD);  Surgeon: Maryruth Ole DASEN, MD;  Location: [facility] ENDOSCOPY;  Service: Endoscopy;  Laterality: N/A;   HIP ARTHROPLASTY Right 03/20/2021   Procedure: ARTHROPLASTY BIPOLAR HIP (HEMIARTHROPLASTY);  Surgeon: Edie Norleen PARAS, MD;  Location: [facility] ORS;  Service: Orthopedics;  Laterality: Right;   LUMBAR LAMINECTOMY/DECOMPRESSION MICRODISCECTOMY Right 11/24/2019   Procedure: OPEN L3/4 LAMINECTOMY WITH CYST REMOVAL;  Surgeon: Bluford Standing, MD;  Location: [facility] ORS;  Service: Neurosurgery;  Laterality: Right;   SPINE SURGERY  beginning 1994   laminectomies, fusions    Home Medications:  Allergies as of 05/12/2024       Reactions   Prednisone Anxiety        Medication List        Accurate as of May 12, 2024  8:29 AM. If you have any questions, ask your nurse or doctor.          albuterol  108 (90 Base) MCG/ACT inhaler Commonly known as: VENTOLIN  HFA Inhale 2 puffs into the lungs every 6 (six) hours as needed for wheezing or shortness of breath.   alendronate 70 MG tablet Commonly known as: FOSAMAX Take 70 mg by mouth once a week.   aspirin  EC 81 MG tablet Take 81 mg by mouth.   atorvastatin  20 MG tablet Commonly known as: LIPITOR Take 20 mg by mouth daily.   Baclofen 5 MG Tabs TAKE  ONE TABLET BY MOUTH DAILY AT 9AM EVERY MORNING and TAKE TWO TABLETS BY MOUTH DAILY AT 9PM AT BEDTIME   busPIRone  10 MG tablet Commonly known as: BUSPAR  Take 10 mg by mouth 3 (three) times daily.   diltiazem  240 MG 24 hr capsule Commonly known as: CARDIZEM  CD Take 240 mg by mouth daily.   DULoxetine  60 MG capsule Commonly known as: CYMBALTA  Take 60 mg by mouth daily.   folic acid  1 MG tablet Commonly known as: FOLVITE  Take 1 tablet (1 mg total) by mouth daily.   hydroxychloroquine  200 MG tablet Commonly known as: PLAQUENIL  Take 200 mg by mouth daily.  ipratropium-albuterol  0.5-2.5 (3) MG/3ML Soln Commonly known as: DUONEB Inhale 3 mLs into the lungs every 4 (four) hours as needed (wheezing).   lisinopril  20 MG tablet Commonly known as: ZESTRIL  Take 1 tablet (20 mg total) by mouth daily.   methotrexate  2.5 MG tablet Commonly known as: RHEUMATREX Take 17.5 mg by mouth every Sunday.   metoprolol  tartrate 25 MG tablet Commonly known as: LOPRESSOR  Take 1 tablet (25 mg total) by mouth 2 (two) times daily.   omeprazole 40 MG capsule Commonly known as: PRILOSEC Take 40 mg by mouth daily as needed (acid reflux symptoms).   oxybutynin  10 MG 24 hr tablet Commonly known as: DITROPAN -XL Take 1 tablet (10 mg total) by mouth at bedtime.   OXYGEN Inhale into the lungs. PRN   solifenacin  5 MG tablet Commonly known as: VESICARE  Take 1 tablet (5 mg total) by mouth daily.   Trelegy Ellipta 100-62.5-25 MCG/INH Aepb Generic drug: Fluticasone -Umeclidin-Vilant Inhale 2 puffs into the lungs daily.        Allergies:  Allergies  Allergen Reactions   Prednisone Anxiety    Family History: Family History  Problem Relation Age of Onset   Alcohol abuse Father    Celiac disease Sister    Drug abuse Maternal Aunt    Suicidality Maternal Aunt    Alcohol abuse Paternal Grandfather    Bipolar disorder Daughter    Drug abuse Son    Breast cancer Neg Hx     Social History:   reports that she has been smoking e-cigarettes. She has been exposed to tobacco smoke. She has never used smokeless tobacco. She reports that she does not currently use alcohol. She reports that she does not currently use drugs.  ROS:                                        Physical Exam: There were no vitals taken for this visit.  Constitutional:  Alert and oriented, No acute distress.  Laboratory Data: Lab Results  Component Value Date   WBC 5.9 12/01/2023   HGB 13.9 12/01/2023   HCT 42.6 12/01/2023   MCV 95.7 12/01/2023   PLT 248 12/01/2023    Lab Results  Component Value Date   CREATININE 0.63 12/01/2023    No results found for: PSA  No results found for: TESTOSTERONE  Lab Results  Component Value Date   HGBA1C 5.5 03/21/2019    Urinalysis    Component Value Date/Time   COLORURINE YELLOW 11/30/2023 0350   APPEARANCEUR CLEAR 11/30/2023 0350   APPEARANCEUR Clear 05/14/2023 0816   LABSPEC 1.045 (H) 11/30/2023 0350   PHURINE 6.0 11/30/2023 0350   GLUCOSEU NEGATIVE 11/30/2023 0350   HGBUR NEGATIVE 11/30/2023 0350   BILIRUBINUR NEGATIVE 11/30/2023 0350   BILIRUBINUR Negative 05/14/2023 0816   KETONESUR 5 (A) 11/30/2023 0350   PROTEINUR 30 (A) 11/30/2023 0350   NITRITE NEGATIVE 11/30/2023 0350   LEUKOCYTESUR NEGATIVE 11/30/2023 0350    Pertinent Imaging:   Assessment & Plan: Vesicare  and oxybutynin  renewed 90 x 3 and I will see in a year  1. Mixed incontinence (Primary)  - Urinalysis, Complete   No follow-ups on file.  Glendia DELENA Elizabeth, MD  [facility] [address] [place], [place] [address] [phone]

## 2024-05-16 ENCOUNTER — Encounter (HOSPITAL_COMMUNITY): Payer: Self-pay

## 2024-05-16 ENCOUNTER — Telehealth (HOSPITAL_COMMUNITY): Payer: Self-pay | Admitting: Emergency Medicine

## 2024-05-16 NOTE — Telephone Encounter (Signed)
 Attempted to call patient regarding upcoming cardiac CT appointment. Left message on voicemail with name and callback number Rockwell Alexandria RN Navigator Cardiac Imaging [facility] [phone] Office [phone] Cell

## 2024-05-19 ENCOUNTER — Ambulatory Visit
Admission: RE | Admit: 2024-05-19 | Discharge: 2024-05-19 | Disposition: A | Discharge status: Home / Self Care | Source: Ambulatory Visit | Attending: Internal Medicine | Admitting: Internal Medicine

## 2024-05-19 DIAGNOSIS — R0602 Shortness of breath: Secondary | ICD-10-CM | POA: Diagnosis present

## 2024-05-19 DIAGNOSIS — R0789 Other chest pain: Secondary | ICD-10-CM | POA: Diagnosis present

## 2024-05-19 DIAGNOSIS — I2510 Atherosclerotic heart disease of native coronary artery without angina pectoris: Secondary | ICD-10-CM | POA: Insufficient documentation

## 2024-05-19 DIAGNOSIS — I3139 Other pericardial effusion (noninflammatory): Secondary | ICD-10-CM | POA: Insufficient documentation

## 2024-05-19 MED ORDER — DILTIAZEM HCL 25 MG/5ML IV SOLN
10.0000 mg | INTRAVENOUS | Status: DC | PRN
  Filled 2024-05-19: qty 5

## 2024-05-19 MED ORDER — NITROGLYCERIN 0.4 MG SL SUBL
0.8000 mg | SUBLINGUAL_TABLET | Freq: Once | SUBLINGUAL | Status: AC
  Administered 2024-05-19 (×2): 0.8 mg via SUBLINGUAL
  Filled 2024-05-19: qty 25

## 2024-05-19 MED ORDER — METOPROLOL TARTRATE 5 MG/5ML IV SOLN
10.0000 mg | INTRAVENOUS | Status: DC | PRN
  Filled 2024-05-19: qty 10

## 2024-05-19 MED ORDER — IOHEXOL 350 MG/ML SOLN
100.0000 mL | Freq: Once | INTRAVENOUS | Status: AC | PRN
  Administered 2024-05-19 (×2): 100 mL via INTRAVENOUS

## 2024-05-19 MED ORDER — NITROGLYCERIN 0.4 MG SL SUBL
SUBLINGUAL_TABLET | SUBLINGUAL | Status: AC
  Filled 2024-05-19: qty 1

## 2024-05-19 NOTE — Progress Notes (Signed)
 Patient tolerated procedure well. Ambulate w/o difficulty. Denies any lightheadedness or being dizzy. Pt denies any pain at this time. Sitting in chair. Pt is encouraged to drink additional water throughout the day and reason explained to patient. Patient verbalized understanding and all questions answered. ABC intact. No further needs at this time. Discharge from procedure area w/o issues.

## 2024-05-30 ENCOUNTER — Other Ambulatory Visit: Payer: Self-pay | Admitting: Physician Assistant

## 2024-05-30 DIAGNOSIS — R202 Paresthesia of skin: Secondary | ICD-10-CM

## 2024-05-30 DIAGNOSIS — M5441 Lumbago with sciatica, right side: Secondary | ICD-10-CM

## 2024-06-03 ENCOUNTER — Ambulatory Visit
Admission: RE | Admit: 2024-06-03 | Discharge: 2024-06-03 | Disposition: A | Discharge status: Home / Self Care | Source: Ambulatory Visit | Attending: Physician Assistant | Admitting: Physician Assistant

## 2024-06-03 DIAGNOSIS — M5441 Lumbago with sciatica, right side: Secondary | ICD-10-CM | POA: Insufficient documentation

## 2024-06-03 DIAGNOSIS — R202 Paresthesia of skin: Secondary | ICD-10-CM | POA: Diagnosis present

## 2024-06-03 DIAGNOSIS — M5442 Lumbago with sciatica, left side: Secondary | ICD-10-CM | POA: Insufficient documentation

## 2024-08-09 ENCOUNTER — Emergency Department

## 2024-08-09 ENCOUNTER — Encounter: Payer: Self-pay | Admitting: Emergency Medicine

## 2024-08-09 DIAGNOSIS — R0789 Other chest pain: Secondary | ICD-10-CM | POA: Insufficient documentation

## 2024-08-09 DIAGNOSIS — M79601 Pain in right arm: Secondary | ICD-10-CM | POA: Diagnosis present

## 2024-08-09 DIAGNOSIS — M069 Rheumatoid arthritis, unspecified: Secondary | ICD-10-CM | POA: Diagnosis not present

## 2024-08-09 DIAGNOSIS — M79602 Pain in left arm: Secondary | ICD-10-CM | POA: Insufficient documentation

## 2024-08-09 DIAGNOSIS — T415X6A Underdosing of therapeutic gases, initial encounter: Secondary | ICD-10-CM | POA: Insufficient documentation

## 2024-08-09 DIAGNOSIS — M25561 Pain in right knee: Secondary | ICD-10-CM | POA: Diagnosis not present

## 2024-08-09 DIAGNOSIS — R0602 Shortness of breath: Secondary | ICD-10-CM | POA: Diagnosis present

## 2024-08-09 DIAGNOSIS — I10 Essential (primary) hypertension: Secondary | ICD-10-CM | POA: Diagnosis not present

## 2024-08-09 DIAGNOSIS — Z79899 Other long term (current) drug therapy: Secondary | ICD-10-CM | POA: Insufficient documentation

## 2024-08-09 DIAGNOSIS — Z92241 Personal history of systemic steroid therapy: Secondary | ICD-10-CM | POA: Diagnosis not present

## 2024-08-09 LAB — CBC
HCT: 49.5 % — ABNORMAL HIGH (ref 36.0–46.0)
Hemoglobin: 16.2 g/dL — ABNORMAL HIGH (ref 12.0–15.0)
MCH: 31.2 pg (ref 26.0–34.0)
MCHC: 32.7 g/dL (ref 30.0–36.0)
MCV: 95.4 fL (ref 80.0–100.0)
Platelets: 258 K/uL (ref 150–400)
RBC: 5.19 MIL/uL — ABNORMAL HIGH (ref 3.87–5.11)
RDW: 13.3 % (ref 11.5–15.5)
WBC: 9.4 K/uL (ref 4.0–10.5)
nRBC: 0 % (ref 0.0–0.2)

## 2024-08-09 NOTE — ED Triage Notes (Addendum)
 Pt reports pain that started in shoulder and now in arms and knees and now in R shoulder and R knee. SOB is baseline. Supposed to be on oxygen 3-4 L but didn't bring it today. Oxygen sat is 96%.

## 2024-08-10 ENCOUNTER — Emergency Department: Admission: EM | Admit: 2024-08-10 | Discharge: 2024-08-10 | Disposition: A | Discharge status: Home / Self Care

## 2024-08-10 DIAGNOSIS — M25561 Pain in right knee: Secondary | ICD-10-CM

## 2024-08-10 DIAGNOSIS — M79601 Pain in right arm: Secondary | ICD-10-CM

## 2024-08-10 DIAGNOSIS — R0789 Other chest pain: Secondary | ICD-10-CM

## 2024-08-10 LAB — HEPATIC FUNCTION PANEL
ALT: 25 U/L (ref 0–44)
AST: 27 U/L (ref 15–41)
Albumin: 3.8 g/dL (ref 3.5–5.0)
Alkaline Phosphatase: 53 U/L (ref 38–126)
Bilirubin, Direct: 0.1 mg/dL (ref 0.0–0.2)
Indirect Bilirubin: 0.5 mg/dL (ref 0.3–0.9)
Total Bilirubin: 0.6 mg/dL (ref 0.0–1.2)
Total Protein: 7.8 g/dL (ref 6.5–8.1)

## 2024-08-10 LAB — BASIC METABOLIC PANEL WITH GFR
Anion gap: 11 (ref 5–15)
BUN: 18 mg/dL (ref 8–23)
CO2: 28 mmol/L (ref 22–32)
Calcium: 10.2 mg/dL (ref 8.9–10.3)
Chloride: 103 mmol/L (ref 98–111)
Creatinine, Ser: 0.89 mg/dL (ref 0.44–1.00)
GFR, Estimated: >60 mL/min (ref >60)
Glucose, Bld: 117 mg/dL — ABNORMAL HIGH (ref 70–99)
Potassium: 3.9 mmol/L (ref 3.5–5.1)
Sodium: 142 mmol/L (ref 135–145)

## 2024-08-10 LAB — SEDIMENTATION RATE: Sed Rate: 3 mm/h (ref 0–30)

## 2024-08-10 LAB — C-REACTIVE PROTEIN: CRP: <0.5 mg/dL (ref <1.0)

## 2024-08-10 LAB — TROPONIN I (HIGH SENSITIVITY)
Troponin I (High Sensitivity): 29 ng/L — ABNORMAL HIGH (ref <18)
Troponin I (High Sensitivity): 31 ng/L — ABNORMAL HIGH (ref <18)

## 2024-08-10 LAB — BRAIN NATRIURETIC PEPTIDE: B Natriuretic Peptide: 95.9 pg/mL (ref 0.0–100.0)

## 2024-08-10 MED ORDER — IPRATROPIUM-ALBUTEROL 0.5-2.5 (3) MG/3ML IN SOLN
3.0000 mL | Freq: Once | RESPIRATORY_TRACT | Status: AC
  Administered 2024-08-10: 3 mL via RESPIRATORY_TRACT
  Filled 2024-08-10: qty 3

## 2024-08-10 NOTE — Discharge Instructions (Addendum)
 You were seen in the emergency department for bilateral arm pain, right knee pain and transient chest pressure.  Workup today was reassuring and I think you are safe to return home.  I cannot rule out the beginnings of a rheumatoid flare given your muscle aches and joint pain.  Please call your rheumatologist and determine whether your medications need to be adjusted or whether you need another course of steroids.  Please return with any acutely worsening symptoms.  I will place an outpatient referral to you for cardiology. -- RETURN PRECAUTIONS & AFTERCARE: (ENGLISH) RETURN PRECAUTIONS: Return immediately to the emergency department or see/call your doctor if you feel worse, weak or have changes in speech or vision, are short of breath, have fever, vomiting, pain, bleeding or dark stool, trouble urinating or any new issues. Return here or see/call your doctor if not improving as expected for your suspected condition. FOLLOW-UP CARE: Call your doctor and/or any doctors we referred you to for more advice and to make an appointment. Do this today, tomorrow or after the weekend. Some doctors only take PPO insurance so if you have HMO insurance you may want to contact your HMO or your regular doctor for referral to a specialist within your plan. Either way tell the doctor's office that it was a referral from the emergency department so you get the soonest possible appointment.  YOUR TEST RESULTS: Take result reports of any blood or urine tests, imaging tests and EKG's to your doctor and any referral doctor. Have any abnormal tests repeated. Your doctor or a referral doctor can let you know when this should be done. Also make sure your doctor contacts this hospital to get any test results that are not currently available such as cultures or special tests for infection and final imaging reports, which are often not available at the time you leave the ER but which may list additional important findings that are not  documented on the preliminary report. BLOOD PRESSURE: If your blood pressure was greater than 120/80 have your blood pressure rechecked within 1 to 2 weeks. MEDICATION SIDE EFFECTS: Do not drive, walk, bike, take the bus, etc. if you have received or are being prescribed any sedating medications such as those for pain or anxiety or certain antihistamines like Benadryl . If you have been give one of these here get a taxi home or have a friend drive you home. Ask your pharmacist to counsel you on potential side effects of any new medication

## 2024-08-10 NOTE — ED Provider Notes (Signed)
 [facility] Provider Note    Event Date/Time   First MD Initiated Contact with Patient 08/10/24 0028     (approximate)   History   Chest Pain   HPI  Teresa Lang is a 68 y.o. female cannabis use disorder, on benzodiazepine, major depressive disorder, rheumatoid arthritis, tobacco use all hypertension hyperlipidemia on 4 L of oxygen at baseline followed by pulmonology who presents to the emergency department with bilateral shoulder pain, right knee pain and some neck pain.  Patient states that pain initiated approximately 2 days ago.  She reports compliance with all of her medications.  She tells me that she just finished a course of prednisone prescribed by her pulmonologist.  Denies any chest pain currently.  Has not had any fevers or chills or rashes.  She presents with her sister who contributes to the history.  Of note, patient arrives by private vehicle and was not wearing any oxygen on arrival      Physical Exam   Triage Vital Signs: ED Triage Vitals [08/09/24 2259]  Encounter Vitals Group     BP (!) 168/98     Girls Systolic BP Percentile      Girls Diastolic BP Percentile      Boys Systolic BP Percentile      Boys Diastolic BP Percentile      Pulse Rate 97     Resp (!) 23     Temp 98.5 F (36.9 C)     Temp Source Oral     SpO2 96 %     Weight      Height      Head Circumference      Peak Flow      Pain Score      Pain Loc      Pain Education      Exclude from Growth Chart     Most recent vital signs: Vitals:   08/10/24 0230 08/10/24 0315  BP: 121/67   Pulse: 95   Resp:    Temp:  98.2 F (36.8 C)  SpO2: 99%     Nursing Triage Note reviewed. Vital signs reviewed and patients oxygen saturation is normoxic  General: Patient is well nourished, well developed, awake and alert, resting comfortably in no acute distress Head: Normocephalic and atraumatic Eyes: Normal inspection, extraocular muscles intact, no  conjunctival pallor Ear, nose, throat: Normal external exam Neck: Normal range of motion Respiratory: Patient is in no respiratory distress, lungs CTAB Cardiovascular: Patient is not tachycardic, RRR without murmur appreciated GI: Abd SNT with no guarding or rebound  Back: Normal inspection of the back with good strength and range of motion throughout all ext Extremities: pulses intact with good cap refills, no LE pitting edema or calf tenderness No erythema over any of the joints, full range of motion of shoulders and elbows and right lower extremity Neuro: The patient is alert and oriented to person, place, and time, appropriately conversive, with 5/5 bilat UE/LE strength, no gross motor or sensory defects noted. Coordination appears to be adequate. Skin: Warm, dry, and intact Psych: normal mood and affect, no SI or HI  ED Results / Procedures / Treatments   Labs (all labs ordered are listed, but only abnormal results are displayed) Labs Reviewed  BASIC METABOLIC PANEL WITH GFR - Abnormal; Notable for the following components:      Result Value   Glucose, Bld 117 (*)    All other components within normal limits  CBC -  Abnormal; Notable for the following components:   RBC 5.19 (*)    Hemoglobin 16.2 (*)    HCT 49.5 (*)    All other components within normal limits  TROPONIN I (HIGH SENSITIVITY) - Abnormal; Notable for the following components:   Troponin I (High Sensitivity) 31 (*)    All other components within normal limits  TROPONIN I (HIGH SENSITIVITY) - Abnormal; Notable for the following components:   Troponin I (High Sensitivity) 29 (*)    All other components within normal limits  HEPATIC FUNCTION PANEL  C-REACTIVE PROTEIN  BRAIN NATRIURETIC PEPTIDE  SEDIMENTATION RATE     EKG EKG and rhythm strip are interpreted by myself:   EKG: [Normal sinus rhythm] at heart rate of 95, normal QRS duration, QTc 442, nonspecific ST segments and T waves no ectopy EKG not  consistent with Acute STEMI Rhythm strip: NSR in lead II   RADIOLOGY Xray chest: No acute abnormality on my independent review interpretation radiologist agrees    PROCEDURES:  Critical Care performed: No  Procedures   MEDICATIONS ORDERED IN ED: Medications  ipratropium-albuterol  (DUONEB) 0.5-2.5 (3) MG/3ML nebulizer solution 3 mL (3 mLs Nebulization Given 08/10/24 0110)     IMPRESSION / MDM / ASSESSMENT AND PLAN / ED COURSE                                Differential diagnosis includes, but is not limited to, rheumatoid flare, electrolyte derangement, anemia, pulmonary fibrosis, atypical ACS   ED course: Patient is well-appearing and satting well on her home oxygen.  Initial troponin was slightly elevated however repeat was downtrending and patient denies any chest pain.  BNP was not elevated.  She had no profound electrolyte derangements or leukocytosis.  She had no elevated inflammatory markers such as ESR or CRP.  On exam I do not think imaging of the joints was indicated as patient has not had trauma and has full range of motion and is able to bear weight.  I counseled the patient that I did not have a cause for her symptoms today however I was worried about the possibility of an early rheumatoid arthritis flare, especially since she recently completed a course of steroids.  She has a rheumatologist and will call to determine whether any medication adjustments need to be completed.  Patient feels comfortable returning home and all questions answered   Clinical Course as of 08/10/24 0745  Sun Aug 10, 2024  0031 Troponin I (High Sensitivity)(!): 31 [HD]  0153 Troponin I (High Sensitivity)(!): 29 Downtrending [HD]  0227 DG Chest 2 View No acute abnormality [HD]  0248 B Natriuretic Peptide: 95.9 Not significantly elevated [HD]  0248 Sed Rate: 3 Not elevated [HD]  0302 I reviewed the workup with the patient and her sister at bedside.  Patient continues to endorse bilateral  elbow pain but denies any chest pain.  I counseled the patient that at this time I did not have a cause for symptoms but I was worried about the beginnings of a rheumatoid flare.  She had just come off prednisone and so encouraged her to follow-up with her rheumatologist to determine whether she needs another course.  Return precautions given and patient voiced understanding.  She is satting well on her home oxygen and feels comfortable returning home [HD]    Clinical Course User Index [HD] Nicholaus Rolland BRAVO, MD   At time of discharge there is no evidence  of acute life, limb, vision, or fertility threat. Patient has stable vital signs, pain is well controlled, patient is ambulatory and p.o. tolerant.  Discharge instructions were completed using the EPIC system. I would refer you to those at this time. All warnings prescriptions follow-up etc. were discussed in detail with the patient. Patient indicates understanding and is agreeable with this plan. All questions answered.  Patient is made aware that they may return to the emergency department for any worsening or new condition or for any other emergency.   -- Risk: 5 This patient has a high risk of morbidity due to further diagnostic testing or treatment. Rationale: This patient's evaluation and management involve a high risk of morbidity due to the potential severity of presenting symptoms, need for diagnostic testing, and/or initiation of treatment that may require close monitoring. The differential includes conditions with potential for significant deterioration or requiring escalation of care. Treatment decisions in the ED, including medication administration, procedural interventions, or disposition planning, reflect this level of risk. COPA: 5 The patient has the following acute or chronic illness/injury that poses a possible threat to life or bodily function: [X] : The patient has a potentially serious acute condition or an acute exacerbation of  a chronic illness requiring urgent evaluation and management in the Emergency Department. The clinical presentation necessitates immediate consideration of life-threatening or function-threatening diagnoses, even if they are ultimately ruled out.   FINAL CLINICAL IMPRESSION(S) / ED DIAGNOSES   Final diagnoses:  Pain in both upper extremities  Acute pain of right knee  Sensation of chest pressure     Rx / DC Orders   ED Discharge Orders          Ordered    Ambulatory referral to Cardiology       Comments: If you have not heard from the Cardiology office within the next 72 hours please call [phone].   08/10/24 0305             Note:  This document was prepared using Dragon voice recognition software and may include unintentional dictation errors.   Nicholaus Rolland BRAVO, MD 08/10/24 [phone]

## 2024-08-11 ENCOUNTER — Other Ambulatory Visit: Payer: Self-pay | Admitting: Family Medicine

## 2024-08-11 DIAGNOSIS — M5412 Radiculopathy, cervical region: Secondary | ICD-10-CM

## 2024-08-14 ENCOUNTER — Encounter: Payer: Self-pay | Admitting: *Deleted

## 2024-08-18 ENCOUNTER — Ambulatory Visit

## 2024-08-18 ENCOUNTER — Ambulatory Visit: Admitting: Speech Pathology

## 2024-08-19 ENCOUNTER — Ambulatory Visit
Admission: RE | Admit: 2024-08-19 | Discharge: 2024-08-19 | Disposition: A | Discharge status: Home / Self Care | Source: Ambulatory Visit | Attending: Family Medicine | Admitting: Family Medicine

## 2024-08-19 DIAGNOSIS — M5412 Radiculopathy, cervical region: Secondary | ICD-10-CM

## 2024-08-22 ENCOUNTER — Other Ambulatory Visit: Payer: Self-pay | Admitting: Family Medicine

## 2024-08-22 DIAGNOSIS — M5412 Radiculopathy, cervical region: Secondary | ICD-10-CM

## 2024-08-25 ENCOUNTER — Encounter: Payer: Self-pay | Admitting: Gastroenterology

## 2024-08-25 ENCOUNTER — Encounter
Admission: RE | Disposition: A | Discharge status: Home / Self Care | Payer: Self-pay | Source: Home / Self Care | Attending: Gastroenterology

## 2024-08-25 ENCOUNTER — Ambulatory Visit
Admission: RE | Admit: 2024-08-25 | Discharge: 2024-08-25 | Disposition: A | Discharge status: Home / Self Care | Attending: Gastroenterology | Admitting: Gastroenterology

## 2024-08-25 ENCOUNTER — Ambulatory Visit: Admitting: Certified Registered"

## 2024-08-25 DIAGNOSIS — Z79899 Other long term (current) drug therapy: Secondary | ICD-10-CM | POA: Diagnosis not present

## 2024-08-25 DIAGNOSIS — I252 Old myocardial infarction: Secondary | ICD-10-CM | POA: Diagnosis not present

## 2024-08-25 DIAGNOSIS — Z1211 Encounter for screening for malignant neoplasm of colon: Secondary | ICD-10-CM | POA: Diagnosis present

## 2024-08-25 DIAGNOSIS — F419 Anxiety disorder, unspecified: Secondary | ICD-10-CM | POA: Diagnosis not present

## 2024-08-25 DIAGNOSIS — K2970 Gastritis, unspecified, without bleeding: Secondary | ICD-10-CM | POA: Diagnosis not present

## 2024-08-25 DIAGNOSIS — Z90710 Acquired absence of both cervix and uterus: Secondary | ICD-10-CM | POA: Diagnosis not present

## 2024-08-25 DIAGNOSIS — Z9049 Acquired absence of other specified parts of digestive tract: Secondary | ICD-10-CM | POA: Diagnosis not present

## 2024-08-25 DIAGNOSIS — F32A Depression, unspecified: Secondary | ICD-10-CM | POA: Insufficient documentation

## 2024-08-25 DIAGNOSIS — K224 Dyskinesia of esophagus: Secondary | ICD-10-CM | POA: Diagnosis not present

## 2024-08-25 DIAGNOSIS — K219 Gastro-esophageal reflux disease without esophagitis: Secondary | ICD-10-CM | POA: Diagnosis not present

## 2024-08-25 DIAGNOSIS — Z7951 Long term (current) use of inhaled steroids: Secondary | ICD-10-CM | POA: Diagnosis not present

## 2024-08-25 DIAGNOSIS — I10 Essential (primary) hypertension: Secondary | ICD-10-CM | POA: Diagnosis not present

## 2024-08-25 DIAGNOSIS — K5730 Diverticulosis of large intestine without perforation or abscess without bleeding: Secondary | ICD-10-CM | POA: Diagnosis not present

## 2024-08-25 DIAGNOSIS — K641 Second degree hemorrhoids: Secondary | ICD-10-CM | POA: Diagnosis not present

## 2024-08-25 DIAGNOSIS — J4489 Other specified chronic obstructive pulmonary disease: Secondary | ICD-10-CM | POA: Insufficient documentation

## 2024-08-25 DIAGNOSIS — F17210 Nicotine dependence, cigarettes, uncomplicated: Secondary | ICD-10-CM | POA: Insufficient documentation

## 2024-08-25 DIAGNOSIS — M810 Age-related osteoporosis without current pathological fracture: Secondary | ICD-10-CM | POA: Diagnosis not present

## 2024-08-25 HISTORY — DX: Major depressive disorder, single episode, unspecified: F32.9

## 2024-08-25 HISTORY — DX: Spondylosis without myelopathy or radiculopathy, lumbar region: M47.816

## 2024-08-25 HISTORY — DX: Myalgia, other site: M79.18

## 2024-08-25 HISTORY — DX: Occipital neuralgia: M54.81

## 2024-08-25 HISTORY — DX: Rheumatoid arthritis, unspecified: M06.9

## 2024-08-25 HISTORY — DX: Age-related osteoporosis without current pathological fracture: M81.0

## 2024-08-25 HISTORY — DX: Displaced midcervical fracture of right femur, initial encounter for closed fracture: S72.031A

## 2024-08-25 HISTORY — DX: Encephalopathy, unspecified: G93.40

## 2024-08-25 HISTORY — DX: Fibromyalgia: M79.7

## 2024-08-25 HISTORY — DX: Post-traumatic stress disorder, unspecified: F43.10

## 2024-08-25 HISTORY — DX: Chronic pain syndrome: G89.4

## 2024-08-25 HISTORY — PX: COLONOSCOPY: SHX5424

## 2024-08-25 HISTORY — PX: ESOPHAGOGASTRODUODENOSCOPY: SHX5428

## 2024-08-25 HISTORY — DX: Other amnesia: R41.3

## 2024-08-25 SURGERY — COLONOSCOPY
Anesthesia: General

## 2024-08-25 MED ORDER — GLYCOPYRROLATE 0.2 MG/ML IJ SOLN
INTRAMUSCULAR | Status: DC | PRN
  Administered 2024-08-25: .2 mg via INTRAVENOUS

## 2024-08-25 MED ORDER — GLYCOPYRROLATE 0.2 MG/ML IJ SOLN
INTRAMUSCULAR | Status: AC
Start: 2024-08-25 — End: 2024-08-25
  Filled 2024-08-25: qty 1

## 2024-08-25 MED ORDER — PROPOFOL 10 MG/ML IV BOLUS
INTRAVENOUS | Status: DC | PRN
  Administered 2024-08-25: 50 mg via INTRAVENOUS
  Administered 2024-08-25 (×2): 20 mg via INTRAVENOUS
  Administered 2024-08-25: 50 mg via INTRAVENOUS

## 2024-08-25 MED ORDER — DEXMEDETOMIDINE HCL IN NACL 80 MCG/20ML IV SOLN
INTRAVENOUS | Status: DC | PRN
Start: 2024-08-25 — End: 2024-08-25
  Administered 2024-08-25 (×2): 4 ug via INTRAVENOUS
  Administered 2024-08-25: 8 ug via INTRAVENOUS
  Administered 2024-08-25: 4 ug via INTRAVENOUS

## 2024-08-25 MED ORDER — SODIUM CHLORIDE 0.9 % IV SOLN: INTRAVENOUS | Status: DC

## 2024-08-25 MED ORDER — PROPOFOL 500 MG/50ML IV EMUL
INTRAVENOUS | Status: DC | PRN
  Administered 2024-08-25: 75 ug/kg/min via INTRAVENOUS

## 2024-08-25 MED ORDER — LIDOCAINE HCL (CARDIAC) PF 100 MG/5ML IV SOSY
PREFILLED_SYRINGE | INTRAVENOUS | Status: DC | PRN
  Administered 2024-08-25: 80 mg via INTRAVENOUS

## 2024-08-25 MED ORDER — LIDOCAINE HCL (PF) 2 % IJ SOLN
INTRAMUSCULAR | Status: AC
  Filled 2024-08-25: qty 5

## 2024-08-25 NOTE — Interval H&P Note (Signed)
 History and Physical Interval Note:  08/25/2024 9:43 AM  Teresa Lang  has presented today for surgery, with the diagnosis of GERD Dysphagia PH TA Polyps.  The various methods of treatment have been discussed with the patient and family. After consideration of risks, benefits and other options for treatment, the patient has consented to  Procedure(s): COLONOSCOPY (N/A) EGD (ESOPHAGOGASTRODUODENOSCOPY) (N/A) as a surgical intervention.  The patient's history has been reviewed, patient examined, no change in status, stable for surgery.  I have reviewed the patient's chart and labs.  Questions were answered to the patient's satisfaction.     Ole ONEIDA Schick  Ok to proceed with EGD/Colonoscopy

## 2024-08-25 NOTE — Op Note (Signed)
 [facility] Gastroenterology Patient Name: Teresa Lang Procedure Date: 08/25/2024 9:30 AM MRN: [id number] Account #: [id number] Date of Birth: [birth year] Admit Type: Outpatient Age: 68 Room: [facility] ENDO ROOM 3 Gender: Female Note Status: Finalized Instrument Name: Endoscope [id number] Procedure:             Upper GI endoscopy Indications:           Dysphagia Providers:             Ole Schick MD, MD Referring MD:          Eva DOROTHA Crimes, MD (Referring MD) Medicines:             Monitored Anesthesia Care Complications:         No immediate complications. Procedure:             Pre-Anesthesia Assessment:                        - Prior to the procedure, a History and Physical was                         performed, and patient medications and allergies were                         reviewed. The patient is competent. The risks and                         benefits of the procedure and the sedation options and                         risks were discussed with the patient. All questions                         were answered and informed consent was obtained.                         Patient identification and proposed procedure were                         verified by the physician, the nurse, the                         anesthesiologist, the anesthetist and the technician                         in the endoscopy suite. Mental Status Examination:                         alert and oriented. Airway Examination: normal                         oropharyngeal airway and neck mobility. Respiratory                         Examination: clear to auscultation. CV Examination:                         normal. Prophylactic Antibiotics: The patient does not  require prophylactic antibiotics. Prior                         Anticoagulants: The patient has taken no anticoagulant                         or antiplatelet agents. ASA Grade Assessment: II - A                          patient with mild systemic disease. After reviewing                         the risks and benefits, the patient was deemed in                         satisfactory condition to undergo the procedure. The                         anesthesia plan was to use monitored anesthesia care                         (MAC). Immediately prior to administration of                         medications, the patient was re-assessed for adequacy                         to receive sedatives. The heart rate, respiratory                         rate, oxygen saturations, blood pressure, adequacy of                         pulmonary ventilation, and response to care were                         monitored throughout the procedure. The physical                         status of the patient was re-assessed after the                         procedure.                        After obtaining informed consent, the endoscope was                         passed under direct vision. Throughout the procedure,                         the patient's blood pressure, pulse, and oxygen                         saturations were monitored continuously. The Endoscope                         was introduced through the mouth, and advanced to the  second part of duodenum. The upper GI endoscopy was                         accomplished without difficulty. The patient tolerated                         the procedure well. Findings:      Abnormal motility was noted in the esophagus. There are extra       peristaltic waves in the esophageal body.      The exam of the esophagus was otherwise normal.      Patchy mild inflammation characterized by erythema was found in the       gastric body.      The examined duodenum was normal. Impression:            - Abnormal esophageal motility, suspicious for                         presbyesophagus.                        - Gastritis.                        -  Normal examined duodenum.                        - No specimens collected. Recommendation:        - Discharge patient to home.                        - Resume previous diet.                        - Continue present medications.                        - Perform ambulatory esophageal manometry if symptoms                         persist.                        - Return to referring physician as previously                         scheduled. Procedure Code(s):     --- Professional ---                        [id number], Esophagogastroduodenoscopy, flexible,                         transoral; diagnostic, including collection of                         specimen(s) by brushing or washing, when performed                         (separate procedure) Diagnosis Code(s):     --- Professional ---                        K22.4, Dyskinesia of esophagus  K29.70, Gastritis, unspecified, without bleeding                        R13.10, Dysphagia, unspecified CPT copyright 2022 American Medical Association. All rights reserved. The codes documented in this report are preliminary and upon coder review may  be revised to meet current compliance requirements. Ole Schick MD, MD 08/25/2024 10:28:56 AM Number of Addenda: 0 Note Initiated On: 08/25/2024 9:30 AM Estimated Blood Loss:  Estimated blood loss: none.      [facility]

## 2024-08-25 NOTE — Anesthesia Preprocedure Evaluation (Addendum)
 Anesthesia Evaluation  Patient identified by MRN, date of birth, ID band Patient awake    Reviewed: Allergy & Precautions, NPO status , Patient's Chart, lab work & pertinent test results  History of Anesthesia Complications Negative for: history of anesthetic complications  Airway Mallampati: II  TM Distance: >3 FB Neck ROM: full    Dental  (+) Upper Dentures, Edentulous Lower   Pulmonary shortness of breath, asthma , COPD,  COPD inhaler, Current Smoker and Patient abstained from smoking.   Pulmonary exam normal        Cardiovascular hypertension, On Medications + Past MI  Normal cardiovascular exam     Neuro/Psych  Headaches PSYCHIATRIC DISORDERS Anxiety Depression     Neuromuscular disease    GI/Hepatic Neg liver ROS,GERD  ,,  Endo/Other  negative endocrine ROS    Renal/GU negative Renal ROS  negative genitourinary   Musculoskeletal   Abdominal   Peds  Hematology negative hematology ROS (+)   Anesthesia Other Findings Past Medical History: No date: Allergy     Comment:  seasonal No date: Anxiety No date: Arthritis No date: Asthma No date: Cervico-occipital neuralgia of right side No date: Closed displaced midcervical fracture of right femur (HCC) No date: COPD (chronic obstructive pulmonary disease) (HCC) No date: Depression No date: Dyspnea No date: Encephalopathy No date: Fibromyalgia No date: GERD (gastroesophageal reflux disease) No date: Hyperlipidemia No date: Hypertension No date: Lumbar spondylosis No date: MDD (major depressive disorder) No date: Memory problem No date: Memory problem No date: Myofascial pain dysfunction syndrome No date: Osteoporosis No date: Pain syndrome, chronic No date: PTSD (post-traumatic stress disorder) No date: Rheumatoid arthritis involving multiple sites [facility])  Past Surgical History: 1983: ABDOMINAL HYSTERECTOMY No date: BACK SURGERY No date: CERVICAL DISC  SURGERY 1983: CHOLECYSTECTOMY 08/08/2022: COLONOSCOPY WITH PROPOFOL ; N/A     Comment:  Procedure: COLONOSCOPY WITH PROPOFOL ;  Surgeon:               Maryruth Ole DASEN, MD;  Location: [facility] ENDOSCOPY;                Service: Endoscopy;  Laterality: N/A; 08/08/2022: ESOPHAGOGASTRODUODENOSCOPY; N/A     Comment:  Procedure: ESOPHAGOGASTRODUODENOSCOPY (EGD);  Surgeon:               Maryruth Ole DASEN, MD;  Location: [facility] ENDOSCOPY;                Service: Endoscopy;  Laterality: N/A; No date: FRACTURE SURGERY 03/20/2021: HIP ARTHROPLASTY; Right     Comment:  Procedure: ARTHROPLASTY BIPOLAR HIP (HEMIARTHROPLASTY);               Surgeon: Edie Norleen PARAS, MD;  Location: [facility] ORS;                Service: Orthopedics;  Laterality: Right; No date: JOINT REPLACEMENT 11/24/2019: LUMBAR LAMINECTOMY/DECOMPRESSION MICRODISCECTOMY; Right     Comment:  Procedure: OPEN L3/4 LAMINECTOMY WITH CYST REMOVAL;                Surgeon: Bluford Standing, MD;  Location: [facility] ORS;  Service:              Neurosurgery;  Laterality: Right; beginning 1994: SPINE SURGERY     Comment:  laminectomies, fusions     Reproductive/Obstetrics negative OB ROS                              Anesthesia Physical Anesthesia Plan  ASA: 2  Anesthesia Plan: General   Post-op Pain Management: Minimal or no pain anticipated   Induction: Intravenous  PONV Risk Score and Plan: 2 and Propofol  infusion and TIVA  Airway Management Planned: Natural Airway and Nasal Cannula  Additional Equipment:   Intra-op Plan:   Post-operative Plan:   Informed Consent: I have reviewed the patients History and Physical, chart, labs and discussed the procedure including the risks, benefits and alternatives for the proposed anesthesia with the patient or authorized representative who has indicated his/her understanding and acceptance.     Dental Advisory Given  Plan Discussed with: Anesthesiologist, CRNA and  Surgeon  Anesthesia Plan Comments: (Patient consented for risks of anesthesia including but not limited to:  - adverse reactions to medications - risk of airway placement if required - damage to eyes, teeth, lips or other oral mucosa - nerve damage due to positioning  - sore throat or hoarseness - Damage to heart, brain, nerves, lungs, other parts of body or loss of life  Patient voiced understanding and assent.)         Anesthesia Quick Evaluation

## 2024-08-25 NOTE — Transfer of Care (Signed)
 Immediate Anesthesia Transfer of Care Note  Patient: Teresa Lang  Procedure(s) Performed: COLONOSCOPY EGD (ESOPHAGOGASTRODUODENOSCOPY)  Patient Location: PACU  Anesthesia Type:General  Level of Consciousness: sedated  Airway & Oxygen Therapy: Patient Spontanous Breathing  Post-op Assessment: Report given to RN and Post -op Vital signs reviewed and stable  Post vital signs: Reviewed and stable  Last Vitals:  Vitals Value Taken Time  BP 105/67 08/25/24 10:17  Temp 36.1 C 08/25/24 10:17  Pulse 69 08/25/24 10:18  Resp 16 08/25/24 10:18  SpO2 99 % 08/25/24 10:18  Vitals shown include unfiled device data.  Last Pain:  Vitals:   08/25/24 1017  TempSrc: Tympanic  PainSc: Asleep         Complications: No notable events documented.

## 2024-08-25 NOTE — Anesthesia Postprocedure Evaluation (Signed)
 Anesthesia Post Note  Patient: Teresa Lang  Procedure(s) Performed: COLONOSCOPY EGD (ESOPHAGOGASTRODUODENOSCOPY)  Patient location during evaluation: Endoscopy Anesthesia Type: General Level of consciousness: awake and alert Pain management: pain level controlled Vital Signs Assessment: post-procedure vital signs reviewed and stable Respiratory status: spontaneous breathing, nonlabored ventilation, respiratory function stable and patient connected to nasal cannula oxygen Cardiovascular status: blood pressure returned to baseline and stable Postop Assessment: no apparent nausea or vomiting Anesthetic complications: no   No notable events documented.   Last Vitals:  Vitals:   08/25/24 1027 08/25/24 1037  BP: 103/64 114/79  Pulse: 71 72  Resp: 20 16  Temp:    SpO2: 98% 100%    Last Pain:  Vitals:   08/25/24 1037  TempSrc:   PainSc: 0-No pain                 Lendia LITTIE Mae

## 2024-08-25 NOTE — Op Note (Signed)
 [facility] Gastroenterology Patient Name: Teresa Lang Procedure Date: 08/25/2024 9:29 AM MRN: [id number] Account #: [id number] Date of Birth: 03-May-1956 Admit Type: Outpatient Age: 68 Room: [facility] ENDO ROOM 3 Gender: Female Note Status: Finalized Instrument Name: Colon Scope [id number] Procedure:             Colonoscopy Indications:           Surveillance: History of adenomatous polyps,                         inadequate prep on last exam (<74yr) Providers:             Ole Schick MD, MD Referring MD:          Eva DOROTHA Crimes, MD (Referring MD) Medicines:             Monitored Anesthesia Care Complications:         No immediate complications. Procedure:             Pre-Anesthesia Assessment:                        - Prior to the procedure, a History and Physical was                         performed, and patient medications and allergies were                         reviewed. The patient is competent. The risks and                         benefits of the procedure and the sedation options and                         risks were discussed with the patient. All questions                         were answered and informed consent was obtained.                         Patient identification and proposed procedure were                         verified by the physician, the nurse, the                         anesthesiologist, the anesthetist and the technician                         in the endoscopy suite. Mental Status Examination:                         alert and oriented. Airway Examination: normal                         oropharyngeal airway and neck mobility. Respiratory                         Examination: clear to auscultation. CV Examination:  normal. Prophylactic Antibiotics: The patient does not                         require prophylactic antibiotics. Prior                         Anticoagulants: The patient has taken no  anticoagulant                         or antiplatelet agents. ASA Grade Assessment: II - A                         patient with mild systemic disease. After reviewing                         the risks and benefits, the patient was deemed in                         satisfactory condition to undergo the procedure. The                         anesthesia plan was to use monitored anesthesia care                         (MAC). Immediately prior to administration of                         medications, the patient was re-assessed for adequacy                         to receive sedatives. The heart rate, respiratory                         rate, oxygen saturations, blood pressure, adequacy of                         pulmonary ventilation, and response to care were                         monitored throughout the procedure. The physical                         status of the patient was re-assessed after the                         procedure.                        After obtaining informed consent, the colonoscope was                         passed under direct vision. Throughout the procedure,                         the patient's blood pressure, pulse, and oxygen                         saturations were monitored continuously. The  Colonoscope was introduced through the anus and                         advanced to the the cecum, identified by appendiceal                         orifice and ileocecal valve. The colonoscopy was                         somewhat difficult due to a tortuous colon. The                         patient tolerated the procedure well. The quality of                         the bowel preparation was fair. The ileocecal valve,                         appendiceal orifice, and rectum were photographed. Findings:      The perianal and digital rectal examinations were normal.      Multiple small-mouthed diverticula were found in the sigmoid colon,        descending colon and transverse colon.      Internal hemorrhoids were found during endoscopy. The hemorrhoids were       Grade II (internal hemorrhoids that prolapse but reduce spontaneously).      The exam was otherwise without abnormality. Impression:            - Preparation of the colon was fair.                        - Diverticulosis in the sigmoid colon, in the                         descending colon and in the transverse colon.                        - Internal hemorrhoids.                        - The examination was otherwise normal.                        - No specimens collected. Recommendation:        - Discharge patient to home.                        - Resume previous diet.                        - Continue present medications.                        - Repeat colonoscopy in 2 years because the bowel                         preparation was suboptimal.                        - Return to referring physician as previously  scheduled. Procedure Code(s):     --- Professional ---                        H9894, Colorectal cancer screening; colonoscopy on                         individual at high risk Diagnosis Code(s):     --- Professional ---                        Z86.010, Personal history of colonic polyps                        K64.1, Second degree hemorrhoids                        K57.30, Diverticulosis of large intestine without                         perforation or abscess without bleeding CPT copyright 2022 American Medical Association. All rights reserved. The codes documented in this report are preliminary and upon coder review may  be revised to meet current compliance requirements. Ole Schick MD, MD 08/25/2024 10:33:29 AM Number of Addenda: 0 Note Initiated On: 08/25/2024 9:29 AM Scope Withdrawal Time: 0 hours 5 minutes 5 seconds  Total Procedure Duration: 0 hours 14 minutes 50 seconds  Estimated Blood Loss:  Estimated blood loss:  none.      [facility]

## 2024-08-25 NOTE — H&P (Signed)
 Outpatient short stay form Pre-procedure 08/25/2024  Teresa ONEIDA Schick, MD  Primary Physician: Marikay Eva POUR, PA  Reason for visit:  Dysphagia/Constipation  History of present illness:    68 y/o lady with history of COPD, arthritis, and hypertension here for EGD/Colonoscopy for solid food dysphagia and history of polyps. No blood thinners. No family history of GI malignancies. History of hysterectomy and cholecystectomy.    Current Facility-Administered Medications:    0.9 %  sodium chloride  infusion, , Intravenous, Continuous, [place]aan Prue, Teresa ONEIDA, MD  Medications Prior to Admission  Medication Sig Dispense Refill Last Dose/Taking   aspirin  EC 81 MG tablet Take 81 mg by mouth.   08/24/2024 Morning   atorvastatin  (LIPITOR) 20 MG tablet Take 20 mg by mouth daily.   Past Week   Baclofen 5 MG TABS TAKE ONE TABLET BY MOUTH DAILY AT 9AM EVERY MORNING and TAKE TWO TABLETS BY MOUTH DAILY AT 9PM AT BEDTIME   08/24/2024 Morning   busPIRone  (BUSPAR ) 10 MG tablet Take 10 mg by mouth 3 (three) times daily.   08/24/2024 Morning   diclofenac  Sodium (VOLTAREN ) 1 % GEL APPLY 2 GRAMS TOPICALLY TO THE AFFECTED AREA FOUR TIMES DAILY.   Past Week   diltiazem  (CARDIZEM  CD) 240 MG 24 hr capsule Take 240 mg by mouth daily.   08/24/2024 Morning   DULoxetine  (CYMBALTA ) 60 MG capsule Take 60 mg by mouth daily.   08/24/2024 Morning   folic acid  (FOLVITE ) 1 MG tablet Take 1 tablet (1 mg total) by mouth daily. 30 tablet 1 Past Week   guaiFENesin  (MUCINEX ) 600 MG 12 hr tablet Take 600 mg by mouth.   Past Week   hydroxychloroquine  (PLAQUENIL ) 200 MG tablet Take 200 mg by mouth daily.   08/24/2024 Morning   lisinopril  (ZESTRIL ) 20 MG tablet Take 1 tablet (20 mg total) by mouth daily.   08/24/2024 Morning   methotrexate  (RHEUMATREX) 2.5 MG tablet Take 17.5 mg by mouth every Sunday.   08/24/2024   metoprolol  tartrate (LOPRESSOR ) 25 MG tablet Take 1 tablet (25 mg total) by mouth 2 (two) times daily.   08/24/2024  Morning   omeprazole (PRILOSEC) 40 MG capsule Take 40 mg by mouth daily as needed (acid reflux symptoms).   08/24/2024 Morning   oxybutynin  (DITROPAN -XL) 10 MG 24 hr tablet Take 1 tablet (10 mg total) by mouth at bedtime. 90 tablet 3 08/24/2024 Evening   OXYGEN Inhale into the lungs. PRN   08/25/2024 Morning   QUEtiapine  (SEROQUEL ) 25 MG tablet Take 25 mg by mouth 3 (three) times daily.   08/24/2024 Morning   solifenacin  (VESICARE ) 5 MG tablet Take 1 tablet (5 mg total) by mouth daily. 90 tablet 3 08/24/2024 Morning   traZODone  (DESYREL ) 100 MG tablet Take 100 mg by mouth at bedtime.   08/24/2024 Bedtime   albuterol  (VENTOLIN  HFA) 108 (90 Base) MCG/ACT inhaler Inhale 2 puffs into the lungs every 6 (six) hours as needed for wheezing or shortness of breath. 6.7 g 1    predniSONE (DELTASONE) 5 MG tablet 6 po day one, decrease by 10mg  qd til gone. (Patient not taking: Reported on 08/25/2024)   Completed Course   predniSONE (STERAPRED UNI-PAK 21 TAB) 10 MG (21) TBPK tablet Take by mouth.      sulfamethoxazole-trimethoprim (BACTRIM) 400-80 MG tablet Take 1 tablet by mouth 3 (three) times a week. (Patient not taking: Reported on 08/25/2024)   Completed Course   TRELEGY ELLIPTA 100-62.5-25 MCG/INH AEPB Inhale 2 puffs into the lungs daily. (Patient  not taking: No sig reported)   Not Taking     Allergies  Allergen Reactions   Prednisone Anxiety     Past Medical History:  Diagnosis Date   Allergy    seasonal   Anxiety    Arthritis    Asthma    Cervico-occipital neuralgia of right side    Closed displaced midcervical fracture of right femur (HCC)    COPD (chronic obstructive pulmonary disease) (HCC)    Depression    Dyspnea    Encephalopathy    Fibromyalgia    GERD (gastroesophageal reflux disease)    Hyperlipidemia    Hypertension    Lumbar spondylosis    MDD (major depressive disorder)    Memory problem    Memory problem    Myofascial pain dysfunction syndrome    Osteoporosis     Pain syndrome, chronic    PTSD (post-traumatic stress disorder)    Rheumatoid arthritis involving multiple sites (HCC)     Review of systems:  Otherwise negative.    Physical Exam  Gen: Alert, oriented. Appears stated age.  HEENT: PERRLA. Lungs: No respiratory distress CV: RRR Abd: soft, benign, no masses Ext: No edema    Planned procedures: Proceed with EGD/colonoscopy. The patient understands the nature of the planned procedure, indications, risks, alternatives and potential complications including but not limited to bleeding, infection, perforation, damage to internal organs and possible oversedation/side effects from anesthesia. The patient agrees and gives consent to proceed.  Please refer to procedure notes for findings, recommendations and patient disposition/instructions.     Teresa ONEIDA Schick, MD [facility] Gastroenterology

## 2024-08-27 ENCOUNTER — Encounter: Payer: Self-pay | Admitting: Family Medicine

## 2024-09-17 NOTE — Discharge Instructions (Signed)

## 2024-09-18 ENCOUNTER — Ambulatory Visit
Admission: RE | Admit: 2024-09-18 | Discharge: 2024-09-18 | Disposition: A | Discharge status: Home / Self Care | Source: Ambulatory Visit | Attending: Family Medicine | Admitting: Family Medicine

## 2024-09-18 DIAGNOSIS — M5412 Radiculopathy, cervical region: Secondary | ICD-10-CM

## 2024-09-18 MED ORDER — TRIAMCINOLONE ACETONIDE 40 MG/ML IJ SUSP (RADIOLOGY)
60.0000 mg | Freq: Once | INTRAMUSCULAR | Status: AC
  Administered 2024-09-18: 60 mg via EPIDURAL

## 2024-09-18 MED ORDER — IOPAMIDOL (ISOVUE-300) INJECTION 61%
3.0000 mL | Freq: Once | INTRAVENOUS | Status: AC | PRN
  Administered 2024-09-18: 13:00:00 3 mL

## 2024-09-24 NOTE — Progress Notes (Signed)
 "                                DIVISION OF PULMONARY AND CRITICAL CARE MEDICINE                              FOLLOW UP ENCOUNTER     Chief complaint: Acute on chronic dyspnea with sub-acute cough  History of Present Illness Teresa Lang is a 68 year old female with COPD who presents with a persistent cough and difficulty breathing.  She initially felt better for a couple of days but then developed a persistent cough. The cough is accompanied by thick phlegm. She uses a nebulizer and Breztri inhaler to manage her symptoms, especially when she experiences difficulty breathing. Her oxygen levels fluctuate, feeling fine at times but requiring the use of inhalers during activities like driving.  She received a steroid shot in her spine a few days ago, which initially provided some relief for her back pain, but the effect diminished over time. She is currently on methotrexate  and a Monday, Wednesday, Friday antibiotic regimen.  She reports that her ankle swelling is less than it was previously. She mentions that she looks better than she had earlier.  She has a history of opioid addiction but states that she no longer has that problem. She has previously taken codeine.   Past Medical History:   Past Medical History:  Diagnosis Date   Abnormal weight loss 04/28/2021   Accidental fall 03/19/2021   Allergy    Pollen   Angina at rest () 12/08/2018   Anxiety 11/14/2018   Anxiety and depression    Anxiety, generalized 12/14/2017   Arthritis 2003   Arthritis of left shoulder region 03/11/2020   Cervical post-laminectomy syndrome 02/18/2018   Cervico-occipital neuralgia of right side 03/21/2018   Chest pain at rest 04/15/2018   Chronic obstructive pulmonary disease, unspecified (CMS/HHS-HCC) 03/19/2021   Chronic obstructive pulmonary disease, unspecified (CMS/HHS-HCC) 03/19/2021   Closed displaced midcervical fracture of right femur (CMS/HHS-HCC) 03/21/2021   Closed  fracture of proximal end of right humerus 08/25/2019   Closed right hip fracture (CMS/HHS-HCC) 03/19/2021   COPD (chronic obstructive pulmonary disease) (CMS/HHS-HCC)    Depression 1978   Dyspnea 04/28/2021   Encounter for long-term (current) use of high-risk medication 02/15/2018   Fibromyalgia    Fracture of femoral neck, right (CMS/HHS-HCC) 03/19/2021   HTN (hypertension)    Hyperlipemia, mixed 12/14/2017   Hypertension, essential 12/14/2017   Lumbar spondylosis 02/18/2018   MDD (major depressive disorder), recurrent severe, without psychosis (CMS/HHS-HCC) 11/14/2018   Moderate episode of recurrent major depressive disorder (CMS-HCC) 12/14/2017   Myalgia 06/25/2020   Myocardial infarction (CMS/HHS-HCC) 09/2016   Myofascial pain dysfunction syndrome 03/21/2018   Osteopenia of multiple sites 04/22/2020   Osteoporosis, post-menopausal 2000   Pain of cervical spine 12/14/2017   Pain syndrome, chronic 04/16/2019   Cervical Postlaminectomy, lumbar spondylosis, fibromyalgia, follow-up with Dr. Jerrold pain clinic   Preoperative clearance 03/19/2021   Previous back surgery 11/24/2019   PTSD (post-traumatic stress disorder) 12/14/2017   Rheumatoid arthritis involving multiple sites with positive rheumatoid factor (CMS/HHS-HCC) 12/14/2017   Right knee pain 08/05/2019   S/P lumbar discectomy 11/24/2019   Sacral pain 10/17/2019   Screening for osteoporosis 08/20/2019   Status post hip hemiarthroplasty 03/21/2021   Tobacco user 10/16/2018    Past Surgical History:   Past  Surgical History:  Procedure Laterality Date   back surgery  11/2019   L3/4 laminectomy  11/24/2019   Dr Elspeth Ahle at [facility]    Right hip bipolar hemiarthroplasty Right 03/20/2021   Dr.Poggi   COLONOSCOPY N/A 08/08/2022   Dr. KYM Schick @ [facility] - Diverticulosis, fair prep, PHP, rpt 1 yr d/t suboptimal prep per CTL   EGD N/A 08/08/2022   Dr. KYM Schick @ [facility] - Nml, rpt prn per CTL    COLON @ [facility]  08/25/2024   Prep Fair/ Repeat 2 years/ [facility]   EGD @ [facility]  08/25/2024   Normal/ Repeat As Needed/ [facility]   CHOLECYSTECTOMY     HYSTERECTOMY     POSTERIOR LAMINECTOMY / DECOMPRESSION CERVICAL SPINE     C5,4,3,2    Allergies:   Allergies  Allergen Reactions   Prednisone  Anxiety    Patient states only high dosage pills.    Current Medications:   Prior to Admission medications  Medication Sig Taking? Last Dose  acetaminophen  (TYLENOL ) 500 MG tablet Take 2 tablets (1,000 mg total) by mouth every 6 (six) hours Yes Taking  albuterol  MDI, PROVENTIL , VENTOLIN , PROAIR , HFA 90 mcg/actuation inhaler TAKE 2 PUFFS BY MOUTH EVERY 6 HOURS AS NEEDED Yes Taking  alendronate (FOSAMAX) 70 MG tablet Take 1 tablet (70 mg total) by mouth every 7 (seven) days Take on an empty stomach with a full glass of water. Avoid mineral or well water. Do not eat or take other medications for at least 30 minutes after dose. Sit or stand for at least 30 minutes after dose. Yes Taking  aspirin  81 MG EC tablet Take 81 mg by mouth once daily Yes Taking  atorvastatin  (LIPITOR) 40 MG tablet Take 1 tablet (40 mg total) by mouth once daily Yes Taking  baclofen (LIORESAL) 5 mg tablet TAKE 1 TABLET BY MOUTH THREE TIMES A DAY Yes Taking  budesonide-glycopyrrolate -formoterol (BREZTRI AEROSPHERE) 160-9-4.8 mcg/actuation inhaler Inhale 2 inhalations into the lungs 2 (two) times daily Yes Taking  busPIRone  (BUSPAR ) 15 MG tablet Take 15 mg by mouth 1 tablet AM, 1 tablet afternoon, 2 tablets PM Yes Taking  cyanocobalamin (VITAMIN B12) 1000 MCG tablet Take 1,000 mcg by mouth once daily Yes Taking  diclofenac  (VOLTAREN ) 1 % topical gel APPLY 2 GRAMS TOPICALLY TO THE AFFECTED AREA FOUR TIMES DAILY. Yes Taking  dilTIAZem  (CARDIZEM  CD) 240 MG CD capsule TAKE 1 CAPSULE BY MOUTH ONCE DAILY. Yes Taking  DULoxetine  (CYMBALTA ) 60 MG DR capsule Take 60 mg by mouth once daily Yes Taking  folic acid  (FOLVITE ) 1 MG  tablet Take 1 tablet (1 mg total) by mouth once daily Appointment required for further refills!! Yes Taking  gas permeable lens cleaner (OXYGEN PERMEABLE LENS CLEANER MISC) Inhale into the lungs 3 Liters Yes Taking  guaiFENesin  (MUCINEX ) 600 mg SR tablet Take 600 mg by mouth every 12 (twelve) hours as needed for Cough Yes Taking  hydrocortisone 2.5 % cream APPLY TO AFFECTED SCALY AREAS ON FACE TWICE DAILY UNTIL CLEAR AND THEN MONDAY, WEDNESDAY, FRIDAY FOR MAINTENANCE Yes Taking  hydroxychloroquine  (PLAQUENIL ) 200 mg tablet Take 1 tablet (200 mg total) by mouth once daily Yes Taking  ipratropium-albuteroL  (DUO-NEB) nebulizer solution INHALE VIA NEBULIZER 4 TIMES A DAY AS NEEDED Yes Taking  methotrexate  (RHEUMATREX) 2.5 MG tablet Take 8 tablets (20 mg total) by mouth every 7 (seven) days Yes Taking  omeprazole (PRILOSEC) 40 MG DR capsule Take 1 capsule (40 mg total) by mouth once daily Take 15-20 mins  before meals Yes Taking  ondansetron  (ZOFRAN ) 8 MG tablet Take 8 mg by mouth every 8 (eight) hours as needed for Nausea Yes Taking  oxyBUTYnin  (DITROPAN -XL) 10 MG XL tablet Take 1 tablet (10 mg total) by mouth once daily Yes Taking  predniSONE  (DELTASONE ) 5 MG tablet Take 4 tablets (20 mg total) by mouth once daily for 7 days, THEN 3 tablets (15 mg total) once daily for 7 days, THEN 2 tablets (10 mg total) once daily for 7 days, THEN 1 tablet (5 mg total) once daily for 7 days. Yes Taking  QUEtiapine  (SEROQUEL ) 25 MG tablet Take 25 mg by mouth 3 (three) times daily Yes Taking  sodium, potassium, and magnesium  (SUPREP) oral solution Take 1 Bottle by mouth as directed One kit contains 2 bottles.  Take both bottles at the times instructed by your provider. Yes Taking  solifenacin  (VESICARE ) 5 MG tablet Take 5 mg by mouth once daily Yes Taking  sulfamethoxazole-trimethoprim (BACTRIM SS) 400-80 mg tablet Take 1 tablet (80 mg of trimethoprim total) by mouth 3 (three) times a week for 182 days Yes Taking   TORsemide (DEMADEX) 20 MG tablet Take 1 tablet (20 mg total) by mouth every Monday, Wednesday, and Friday for 39 days Yes Taking  traMADoL  (ULTRAM ) 50 mg tablet Take 1 tablet (50 mg total) by mouth 2 (two) times daily as needed 1 po bid prn Yes Taking  traZODone  (DESYREL ) 100 MG tablet Take 100 mg by mouth at bedtime Yes Taking  HYDROcodone -chlorpheniramine (TUSSIONEX) 10-8 mg/5 mL ER suspension Take 5 mLs by mouth at bedtime as needed for Cough    metoprolol  TARTrate (LOPRESSOR ) 25 MG tablet Take 1 tablet (25 mg total) by mouth 2 (two) times daily      Family History:   Family History  Problem Relation Name Age of Onset   COPD Mother Jori    High blood pressure (Hypertension) Mother Jori    Osteoarthritis Mother Jori    Hyperthyroidism Mother Jori    No Known Problems Father     Bipolar disorder Daughter Tonya    Depression Daughter Tonya     Social History:   Social History   Socioeconomic History   Marital status: Widowed  Occupational History   Occupation: Disabled  Tobacco Use   Smoking status: Former    Types: Cigarettes    Passive exposure: Past   Smokeless tobacco: Never   Tobacco comments:    uses vape with nicotine  (unsure of dose), occasional cigarette  Vaping Use   Vaping status: Former   Start date: 11/30/2018   Quit date: 11/30/2022  Substance and Sexual Activity   Alcohol use: Not Currently    Alcohol/week: 0.0 standard drinks of alcohol   Drug use: Not Currently    Comment: Hu   Sexual activity: Not Currently   Social Drivers of Health   Financial Resource Strain: Medium Risk (09/03/2024)   Overall Financial Resource Strain (CARDIA)    Difficulty of Paying Living Expenses: Somewhat hard  Food Insecurity: Food Insecurity Present (09/03/2024)   Hunger Vital Sign    Worried About Running Out of Food in the Last Year: Sometimes true    Ran Out of Food in the Last Year: Sometimes true  Transportation Needs: No  Transportation Needs (09/03/2024)   PRAPARE - Transportation    Lack of Transportation (Medical): No    Lack of Transportation (Non-Medical): No  Social Connections: Patient Unable To Answer (11/29/2023)   Received from [facility]   Social Connection  and Isolation Panel    In a typical week, how many times do you talk on the phone with family, friends, or neighbors?: Patient unable to answer    How often do you get together with friends or relatives?: Patient unable to answer    How often do you attend church or religious services?: Patient unable to answer    Do you belong to any clubs or organizations such as church groups, unions, fraternal or athletic groups, or school groups?: Patient unable to answer    How often do you attend meetings of the clubs or organizations you belong to?: Patient unable to answer    Are you married, widowed, divorced, separated, never married, or living with a partner?: Patient unable to answer  Housing Stability: High Risk (09/03/2024)   Housing Stability Vital Sign    Unable to Pay for Housing in the Last Year: No    Number of Times Moved in the Last Year: 2    Homeless in the Last Year: Yes    Review of Systems:   A 10 point review of systems is negative, except for the pertinent positives and negatives detailed in the HPI.  Vitals:   Vitals:   09/24/24 0944  BP: 136/82  BP Location: Right upper arm  Patient Position: Sitting  BP Cuff Size: Adult  Pulse: 93  SpO2: 95%  Weight: 95.7 kg (211 lb)  Height: 165.1 cm (5' 5)     Body mass index is 35.11 kg/m.  Physical Exam:   Physical Exam Vitals and nursing note reviewed.  Constitutional:      General: in no acute distress.    Appearance: Normal appearance. Is not ill-appearing, toxic-appearing or diaphoretic.  HENT:     Head: Normocephalic and atraumatic.     Right Ear: External ear normal.     Left Ear: External ear normal.  Eyes:     General:        Right eye: No  discharge.        Left eye: No discharge.     Extraocular Movements: Extraocular movements intact.     Pupils: Pupils are equal, round, and reactive to light.  Cardiovascular:     Rate and Rhythm: Normal rate and regular rhythm.     Pulses: Normal pulses.     Heart sounds: Normal heart sounds. No murmur heard.    No friction rub. No gallop.  Abdominal:     General: Bowel sounds are normal.  Skin:    General: Skin is warm and dry.     Capillary Refill: Capillary refill takes less than 2 seconds.  Neurological:     Mental Status: Patient is alert.     Lab and Imaging Results:   Results      Assessment and Plan:   Diagnoses and all orders for this visit:  Respiratory distress -     X-ray chest PA and lateral; Future  Acute exacerbation of chronic obstructive pulmonary disease (COPD) (CMS/HHS-HCC) -     X-ray chest PA and lateral; Future -     Respiratory Panel w/SARS-CoV2 - [facility]  Other orders -     HYDROcodone -chlorpheniramine (TUSSIONEX) 10-8 mg/5 mL ER suspension; Take 5 mLs by mouth at bedtime as needed for Cough    Assessment & Plan Acute exacerbation of chronic obstructive pulmonary disease (COPD) Experiencing an acute exacerbation of COPD with increased cough and thick phlegm. Oxygen levels fluctuate, with dyspnea during exertion. No significant wheezing on lung examination. Recent steroid  injection provided temporary relief. Currently on antibiotics and steroids. No additional antibiotics or steroids needed. Codeine cough syrup prescribed to relax cough and aid rest. Narcotic addiction history acknowledged, but no current issues reported. - Prescribed codeine cough syrup for nighttime use to relax cough and aid rest. - Ordered chest x-ray to rule out pneumonia. - Conducted viral testing for flu, RSV, COVID, and other viruses.  Chronic pulmonary edema Reports improvement in ankle swelling, previously bulging over shoes. No current need for additional diuretics  as she is already on fluid pills. - Continue current diuretic therapy.    I spent 41 minutes in both face-to-face and non-face-to-face activities including reviewing history, performing an exam and evaluation, entering clinical information EHR, interpreting results, counseling patient/family/caregiver, reviewing x-rays/CT scans/MRI/labs/echocardiography/PFTs, ordering meds/tests/procedures, referring and communicating with consulting healthcare professionals and care coordination. This does not include time spent with staff.    The patient and/or family voices understanding of the plans. All questions and concerns were answered. The patient and /or family was instructed to call if the patient needs to be seen sooner. Thank you for allowing me to participate in the care of this patient. Do not hesitate to contact me via Epic or by calling our office at [phone] with any questions or concerns.     This note has been created using dictation software tool and any typographical errors are purely unintentional.  Patient received an After Visit Summary     "

## 2024-10-01 ENCOUNTER — Other Ambulatory Visit: Payer: Self-pay

## 2024-10-01 ENCOUNTER — Encounter: Payer: Self-pay | Admitting: Emergency Medicine

## 2024-10-01 ENCOUNTER — Emergency Department

## 2024-10-01 ENCOUNTER — Emergency Department: Admission: EM | Admit: 2024-10-01 | Discharge: 2024-10-01 | Disposition: A | Discharge status: Home / Self Care

## 2024-10-01 DIAGNOSIS — R059 Cough, unspecified: Secondary | ICD-10-CM | POA: Diagnosis present

## 2024-10-01 DIAGNOSIS — J441 Chronic obstructive pulmonary disease with (acute) exacerbation: Secondary | ICD-10-CM | POA: Insufficient documentation

## 2024-10-01 LAB — RESP PANEL BY RT-PCR (RSV, FLU A&B, COVID)  RVPGX2
Influenza A by PCR: NEGATIVE
Influenza B by PCR: NEGATIVE
Resp Syncytial Virus by PCR: NEGATIVE
SARS Coronavirus 2 by RT PCR: NEGATIVE

## 2024-10-01 LAB — BASIC METABOLIC PANEL WITH GFR
Anion gap: 14 (ref 5–15)
BUN: 15 mg/dL (ref 8–23)
CO2: 25 mmol/L (ref 22–32)
Calcium: 9.8 mg/dL (ref 8.9–10.3)
Chloride: 102 mmol/L (ref 98–111)
Creatinine, Ser: 0.78 mg/dL (ref 0.44–1.00)
GFR, Estimated: >60 mL/min
Glucose, Bld: 133 mg/dL — ABNORMAL HIGH (ref 70–99)
Potassium: 4 mmol/L (ref 3.5–5.1)
Sodium: 141 mmol/L (ref 135–145)

## 2024-10-01 LAB — CBC
HCT: 44.4 % (ref 36.0–46.0)
Hemoglobin: 14.6 g/dL (ref 12.0–15.0)
MCH: 31.9 pg (ref 26.0–34.0)
MCHC: 32.9 g/dL (ref 30.0–36.0)
MCV: 96.9 fL (ref 80.0–100.0)
Platelets: 240 K/uL (ref 150–400)
RBC: 4.58 MIL/uL (ref 3.87–5.11)
RDW: 14.6 % (ref 11.5–15.5)
WBC: 12.6 K/uL — ABNORMAL HIGH (ref 4.0–10.5)
nRBC: 0 % (ref 0.0–0.2)

## 2024-10-01 MED ORDER — IPRATROPIUM-ALBUTEROL 0.5-2.5 (3) MG/3ML IN SOLN
3.0000 mL | Freq: Once | RESPIRATORY_TRACT | Status: AC
  Administered 2024-10-01: 3 mL via RESPIRATORY_TRACT
  Filled 2024-10-01: qty 3

## 2024-10-01 MED ORDER — METHYLPREDNISOLONE SODIUM SUCC 125 MG IJ SOLR
125.0000 mg | Freq: Once | INTRAMUSCULAR | Status: AC
  Administered 2024-10-01: 125 mg via INTRAVENOUS
  Filled 2024-10-01: qty 2

## 2024-10-01 MED ORDER — PREDNISONE 20 MG PO TABS: 60.0000 mg | ORAL_TABLET | Freq: Every day | ORAL | 0 refills | Status: AC

## 2024-10-01 NOTE — ED Provider Notes (Signed)
 "  [facility] Provider Note    Event Date/Time   First MD Initiated Contact with Patient 10/01/24 1916     (approximate)   History   Shortness of Breath   HPI  Teresa Lang is a 68 y.o. female history of COPD presenting with concern of shortness of breath.  Saw her pulmonologist a few days ago for similar complaints at that time concern of increased cough and sputum production, seemed that she was on antibiotics and steroids at that time.  Seems at that time she had been prescribed some cough syrup and had some further lab testing done, but no other treatment was offered.  Since then has been having worsening of symptoms including increased shortness of breath and cough.  It seems that at baseline she does not use oxygen however she has it available at home, she has been using much more amounts of oxygen and using a nebulizer and inhaler much more frequently than normal.  She is unable to walk about 3-4 steps without feeling like she is going to pass out and having trouble speaking in full sentences.  No noted fevers or chills.     Physical Exam   Triage Vital Signs: ED Triage Vitals [10/01/24 1905]  Encounter Vitals Group     BP (!) 142/81     Girls Systolic BP Percentile      Girls Diastolic BP Percentile      Boys Systolic BP Percentile      Boys Diastolic BP Percentile      Pulse Rate (!) 102     Resp 20     Temp 99.2 F (37.3 C)     Temp Source Oral     SpO2 90 %     Weight      Height      Head Circumference      Peak Flow      Pain Score 9     Pain Loc      Pain Education      Exclude from Growth Chart     Most recent vital signs: Vitals:   10/01/24 1905 10/01/24 2003  BP: (!) 142/81   Pulse: (!) 102   Resp: 20   Temp: 99.2 F (37.3 C)   SpO2: 90% 90%     General: Awake, no distress.  CV:  Good peripheral perfusion.  No noted lower extremity swelling Resp:  Normal effort.  Diffuse wheezes appreciated in the lower lobes and  poor air movement, patient is only able to speak 2 or 3 sentences without getting tachypneic, increased oxygen requirement to 5 L for O2 sats of 91% Abd:  No distention.  Soft nontender Other:     ED Results / Procedures / Treatments   Labs (all labs ordered are listed, but only abnormal results are displayed) Labs Reviewed  BASIC METABOLIC PANEL WITH GFR - Abnormal; Notable for the following components:      Result Value   Glucose, Bld 133 (*)    All other components within normal limits  CBC - Abnormal; Notable for the following components:   WBC 12.6 (*)    All other components within normal limits  RESP PANEL BY RT-PCR (RSV, FLU A&B, COVID)  [id number]     EKG  On my independent interpretation appears to be sinus rhythm with rate of about 95, axis of about 60, intervals appear to be within normal limits, no obvious ischemia and I appreciate this EKG  RADIOLOGY No acute cardiopulmonary findings  PROCEDURES:  Critical Care performed: Yes, see critical care procedure note(s)  .Critical Care  Performed by: Fernand Rossie HERO, MD Authorized by: Fernand Rossie HERO, MD   Critical care provider statement:    Critical care time (minutes):  35   Critical care was necessary to treat or prevent imminent or life-threatening deterioration of the following conditions:  Respiratory failure   Critical care was time spent personally by me on the following activities:  Ordering and performing treatments and interventions, ordering and review of laboratory studies, ordering and review of radiographic studies, re-evaluation of patient's condition, review of old charts, examination of patient, evaluation of patient's response to treatment and development of treatment plan with patient or surrogate    MEDICATIONS ORDERED IN ED: Medications  ipratropium-albuterol  (DUONEB) 0.5-2.5 (3) MG/3ML nebulizer solution 3 mL (3 mLs Nebulization Given 10/01/24 2011)  methylPREDNISolone  sodium succinate  (SOLU-MEDROL ) 125 mg/2 mL injection 125 mg (125 mg Intravenous Given 10/01/24 2011)  ipratropium-albuterol  (DUONEB) 0.5-2.5 (3) MG/3ML nebulizer solution 3 mL (3 mLs Nebulization Given 10/01/24 2148)     IMPRESSION / MDM / ASSESSMENT AND PLAN / ED COURSE  I reviewed the triage vital signs and the nursing notes.                               Patient's presentation is most consistent with acute presentation with potential threat to life or bodily function.  68 year old female presenting with worsening shortness of breath.  Seems that this has been worsening over the last 1 to 2 weeks.  Seems at normal baseline she is able to get around without needing any oxygen but now increased oxygen requirement increased frequency of her nebulizer and inhaler which does seem to improved.  But even now today with this continues to be significantly tachypneic and having the increased oxygen requirement.  History of COPD, no longer on steroids or any antibiotics right now.  Diffuse wheezes appreciated on exam will attempt nebulizers and steroids, will reassess and determine safe and appropriate disposition accordingly.   Clinical Course as of 10/01/24 2244  Wed Oct 01, 2024  2032 Patient completed first nebulizer, she does appear to be better at this time, currently saturating appropriately on room air.  Will monitor her here briefly, will consider discharge home may warrant 1 more nebulizer.  Will need to ambulate prior to discharge for oxygen desaturations and development of tachypnea. [SK]  2055 Patient's work of breathing is improved at this time, she is saturating appropriately on room air, will have her ambulated and assess for increased work of breathing, if this does occur, may warrant a second nebulizer otherwise may be reasonable for discharge home with a brief course of steroids for home over the next few days. [SK]  2123 Patient able to ambulate much better at this time, but does become a little bit  tachypneic with exertion, will give a second nebulizer here and anticipate likely reasonable for discharge home afterwards. [SK]  2227 Patient's work of breathing significantly improved, I did consider admission, patient would prefer discharge home we will write for a course of steroids for her for home I discussed return precautions she will be discharged home at this time. [SK]    Clinical Course User Index [SK] Fernand Rossie HERO, MD     FINAL CLINICAL IMPRESSION(S) / ED DIAGNOSES   Final diagnoses:  COPD exacerbation (HCC)     Rx /  DC Orders   ED Discharge Orders          Ordered    predniSONE  (DELTASONE ) 20 MG tablet  Daily with breakfast        10/01/24 2228             Note:  This document was prepared using Dragon voice recognition software and may include unintentional dictation errors.   Fernand Rossie HERO, MD 10/01/24 2244  "

## 2024-10-01 NOTE — ED Notes (Signed)
 Respiratory swab; blue top, green top, and lav top tubes collected, labeled, and sent to lab.

## 2024-10-01 NOTE — ED Triage Notes (Signed)
 Pt arrives from home via WC on 3L [place] @ baseline reporting sob all day. Pt's sating 90% on 3L, titrated to 4L in triage. Pt reports generalized body aches. Denies cp.

## 2024-10-01 NOTE — Discharge Instructions (Signed)
 You were seen today due to concern of shortness of breath.  I suspect this is likely from worsening of your COPD.  I have written for some steroids for you to take, please take these as instructed.  If you have any worsening of symptoms such as fevers chills shortness of breath, lightheadedness, or any other symptoms you find concerning please return to the emergency department immediately for further medical management.

## 2024-10-20 ENCOUNTER — Other Ambulatory Visit: Payer: Self-pay | Admitting: Family Medicine

## 2024-10-20 DIAGNOSIS — M5416 Radiculopathy, lumbar region: Secondary | ICD-10-CM

## 2024-11-05 NOTE — Discharge Instructions (Signed)

## 2024-11-06 ENCOUNTER — Encounter: Payer: Self-pay | Admitting: Family Medicine

## 2024-11-06 ENCOUNTER — Ambulatory Visit
Admission: RE | Admit: 2024-11-06 | Discharge: 2024-11-06 | Disposition: A | Discharge status: Home / Self Care | Source: Ambulatory Visit | Attending: Family Medicine | Admitting: Family Medicine

## 2024-11-06 ENCOUNTER — Other Ambulatory Visit: Payer: Self-pay | Admitting: Family Medicine

## 2024-11-06 DIAGNOSIS — M5416 Radiculopathy, lumbar region: Secondary | ICD-10-CM

## 2024-11-06 MED ORDER — IOPAMIDOL (ISOVUE-200) INJECTION 41%
3.0000 mL | Freq: Once | INTRAVENOUS | Status: AC | PRN
  Administered 2024-11-06: 3 mL

## 2024-11-06 MED ORDER — METHYLPREDNISOLONE ACETATE 40 MG/ML INJ SUSP (RADIOLOG
80.0000 mg | Freq: Once | INTRAMUSCULAR | Status: AC
  Administered 2024-11-06: 80 mg via EPIDURAL

## 2024-11-08 ENCOUNTER — Emergency Department

## 2024-11-08 ENCOUNTER — Inpatient Hospital Stay
Admission: EM | Admit: 2024-11-08 | Discharge: 2024-11-12 | DRG: 190 | Disposition: A | Attending: Internal Medicine | Admitting: Internal Medicine

## 2024-11-08 ENCOUNTER — Other Ambulatory Visit: Payer: Self-pay

## 2024-11-08 DIAGNOSIS — E876 Hypokalemia: Secondary | ICD-10-CM | POA: Diagnosis present

## 2024-11-08 DIAGNOSIS — Z96641 Presence of right artificial hip joint: Secondary | ICD-10-CM | POA: Diagnosis present

## 2024-11-08 DIAGNOSIS — M797 Fibromyalgia: Secondary | ICD-10-CM | POA: Diagnosis present

## 2024-11-08 DIAGNOSIS — N179 Acute kidney failure, unspecified: Secondary | ICD-10-CM | POA: Diagnosis present

## 2024-11-08 DIAGNOSIS — F0283 Dementia in other diseases classified elsewhere, unspecified severity, with mood disturbance: Secondary | ICD-10-CM | POA: Diagnosis present

## 2024-11-08 DIAGNOSIS — Z7982 Long term (current) use of aspirin: Secondary | ICD-10-CM

## 2024-11-08 DIAGNOSIS — Z79899 Other long term (current) drug therapy: Secondary | ICD-10-CM

## 2024-11-08 DIAGNOSIS — Z818 Family history of other mental and behavioral disorders: Secondary | ICD-10-CM

## 2024-11-08 DIAGNOSIS — G309 Alzheimer's disease, unspecified: Secondary | ICD-10-CM | POA: Diagnosis present

## 2024-11-08 DIAGNOSIS — F4310 Post-traumatic stress disorder, unspecified: Secondary | ICD-10-CM | POA: Diagnosis present

## 2024-11-08 DIAGNOSIS — E7220 Disorder of urea cycle metabolism, unspecified: Secondary | ICD-10-CM | POA: Diagnosis present

## 2024-11-08 DIAGNOSIS — Z79631 Long term (current) use of antimetabolite agent: Secondary | ICD-10-CM

## 2024-11-08 DIAGNOSIS — I10 Essential (primary) hypertension: Secondary | ICD-10-CM | POA: Diagnosis present

## 2024-11-08 DIAGNOSIS — Z8379 Family history of other diseases of the digestive system: Secondary | ICD-10-CM

## 2024-11-08 DIAGNOSIS — K219 Gastro-esophageal reflux disease without esophagitis: Secondary | ICD-10-CM | POA: Diagnosis present

## 2024-11-08 DIAGNOSIS — J432 Centrilobular emphysema: Secondary | ICD-10-CM | POA: Diagnosis present

## 2024-11-08 DIAGNOSIS — J440 Chronic obstructive pulmonary disease with acute lower respiratory infection: Principal | ICD-10-CM | POA: Diagnosis present

## 2024-11-08 DIAGNOSIS — M810 Age-related osteoporosis without current pathological fracture: Secondary | ICD-10-CM | POA: Diagnosis present

## 2024-11-08 DIAGNOSIS — R4182 Altered mental status, unspecified: Principal | ICD-10-CM | POA: Insufficient documentation

## 2024-11-08 DIAGNOSIS — F17290 Nicotine dependence, other tobacco product, uncomplicated: Secondary | ICD-10-CM | POA: Diagnosis present

## 2024-11-08 DIAGNOSIS — J690 Pneumonitis due to inhalation of food and vomit: Secondary | ICD-10-CM | POA: Diagnosis present

## 2024-11-08 DIAGNOSIS — R7989 Other specified abnormal findings of blood chemistry: Secondary | ICD-10-CM

## 2024-11-08 DIAGNOSIS — F0282 Dementia in other diseases classified elsewhere, unspecified severity, with psychotic disturbance: Secondary | ICD-10-CM | POA: Diagnosis present

## 2024-11-08 DIAGNOSIS — I252 Old myocardial infarction: Secondary | ICD-10-CM

## 2024-11-08 DIAGNOSIS — Z7983 Long term (current) use of bisphosphonates: Secondary | ICD-10-CM

## 2024-11-08 DIAGNOSIS — J189 Pneumonia, unspecified organism: Secondary | ICD-10-CM | POA: Diagnosis present

## 2024-11-08 DIAGNOSIS — M0579 Rheumatoid arthritis with rheumatoid factor of multiple sites without organ or systems involvement: Secondary | ICD-10-CM | POA: Diagnosis present

## 2024-11-08 DIAGNOSIS — Z888 Allergy status to other drugs, medicaments and biological substances status: Secondary | ICD-10-CM

## 2024-11-08 DIAGNOSIS — J449 Chronic obstructive pulmonary disease, unspecified: Secondary | ICD-10-CM | POA: Diagnosis present

## 2024-11-08 DIAGNOSIS — Z813 Family history of other psychoactive substance abuse and dependence: Secondary | ICD-10-CM

## 2024-11-08 DIAGNOSIS — I2510 Atherosclerotic heart disease of native coronary artery without angina pectoris: Secondary | ICD-10-CM | POA: Diagnosis present

## 2024-11-08 DIAGNOSIS — Z811 Family history of alcohol abuse and dependence: Secondary | ICD-10-CM

## 2024-11-08 DIAGNOSIS — E785 Hyperlipidemia, unspecified: Secondary | ICD-10-CM | POA: Diagnosis present

## 2024-11-08 DIAGNOSIS — Z7951 Long term (current) use of inhaled steroids: Secondary | ICD-10-CM

## 2024-11-08 DIAGNOSIS — J441 Chronic obstructive pulmonary disease with (acute) exacerbation: Secondary | ICD-10-CM | POA: Diagnosis present

## 2024-11-08 HISTORY — DX: Atherosclerotic heart disease of native coronary artery without angina pectoris: I25.10

## 2024-11-08 HISTORY — DX: Acute myocardial infarction, unspecified: I21.9

## 2024-11-08 LAB — URINE DRUG SCREEN
Amphetamines: NEGATIVE
Barbiturates: NEGATIVE
Benzodiazepines: NEGATIVE
Cocaine: NEGATIVE
Fentanyl: NEGATIVE
Methadone Scn, Ur: NEGATIVE
Opiates: POSITIVE — AB
Tetrahydrocannabinol: NEGATIVE

## 2024-11-08 LAB — AMMONIA: Ammonia: 124 umol/L — ABNORMAL HIGH (ref 9–35)

## 2024-11-08 LAB — URINALYSIS, ROUTINE W REFLEX MICROSCOPIC
Bilirubin Urine: NEGATIVE
Glucose, UA: NEGATIVE mg/dL
Hgb urine dipstick: NEGATIVE
Ketones, ur: NEGATIVE mg/dL
Nitrite: NEGATIVE
Protein, ur: NEGATIVE mg/dL
RBC / HPF: 0 RBC/hpf (ref 0–5)
Specific Gravity, Urine: 1.017 (ref 1.005–1.030)
pH: 5 (ref 5.0–8.0)

## 2024-11-08 LAB — CBC
HCT: 46.8 % — ABNORMAL HIGH (ref 36.0–46.0)
Hemoglobin: 15.4 g/dL — ABNORMAL HIGH (ref 12.0–15.0)
MCH: 31.4 pg (ref 26.0–34.0)
MCHC: 32.9 g/dL (ref 30.0–36.0)
MCV: 95.3 fL (ref 80.0–100.0)
Platelets: 370 10*3/uL (ref 150–400)
RBC: 4.91 MIL/uL (ref 3.87–5.11)
RDW: 14.1 % (ref 11.5–15.5)
WBC: 10.4 10*3/uL (ref 4.0–10.5)
nRBC: 0 % (ref 0.0–0.2)

## 2024-11-08 LAB — TROPONIN T, HIGH SENSITIVITY
Troponin T High Sensitivity: 103 ng/L (ref 0–19)
Troponin T High Sensitivity: 64 ng/L — ABNORMAL HIGH (ref 0–19)

## 2024-11-08 LAB — CBG MONITORING, ED
Glucose-Capillary: 127 mg/dL — ABNORMAL HIGH (ref 70–99)
Glucose-Capillary: 134 mg/dL — ABNORMAL HIGH (ref 70–99)

## 2024-11-08 LAB — COMPREHENSIVE METABOLIC PANEL WITH GFR
ALT: 27 U/L (ref 0–44)
AST: 39 U/L (ref 15–41)
Albumin: 4.1 g/dL (ref 3.5–5.0)
Alkaline Phosphatase: 73 U/L (ref 38–126)
Anion gap: 14 (ref 5–15)
BUN: 22 mg/dL (ref 8–23)
CO2: 33 mmol/L — ABNORMAL HIGH (ref 22–32)
Calcium: 9.9 mg/dL (ref 8.9–10.3)
Chloride: 92 mmol/L — ABNORMAL LOW (ref 98–111)
Creatinine, Ser: 1.45 mg/dL — ABNORMAL HIGH (ref 0.44–1.00)
GFR, Estimated: 39 mL/min — ABNORMAL LOW
Glucose, Bld: 131 mg/dL — ABNORMAL HIGH (ref 70–99)
Potassium: 2.9 mmol/L — ABNORMAL LOW (ref 3.5–5.1)
Sodium: 139 mmol/L (ref 135–145)
Total Bilirubin: 0.7 mg/dL (ref 0.0–1.2)
Total Protein: 7.1 g/dL (ref 6.5–8.1)

## 2024-11-08 LAB — PHOSPHORUS: Phosphorus: 3.9 mg/dL (ref 2.5–4.6)

## 2024-11-08 LAB — BLOOD GAS, VENOUS
Acid-Base Excess: 10.6 mmol/L — ABNORMAL HIGH (ref 0.0–2.0)
Bicarbonate: 38.7 mmol/L — ABNORMAL HIGH (ref 20.0–28.0)
O2 Saturation: 68.6 %
Patient temperature: 37
pCO2, Ven: 67 mmHg — ABNORMAL HIGH (ref 44–60)
pH, Ven: 7.37 (ref 7.25–7.43)
pO2, Ven: 41 mmHg (ref 32–45)

## 2024-11-08 LAB — ACETAMINOPHEN LEVEL: Acetaminophen (Tylenol), Serum: <10 ug/mL — ABNORMAL LOW (ref 10–30)

## 2024-11-08 LAB — ETHANOL: Alcohol, Ethyl (B): <15 mg/dL

## 2024-11-08 LAB — MAGNESIUM: Magnesium: 1.6 mg/dL — ABNORMAL LOW (ref 1.7–2.4)

## 2024-11-08 MED ORDER — AZITHROMYCIN 500 MG PO TABS
500.0000 mg | ORAL_TABLET | Freq: Every day | ORAL | Status: DC
  Administered 2024-11-08 – 2024-11-11 (×4): 500 mg via ORAL
  Filled 2024-11-08 (×4): qty 1

## 2024-11-08 MED ORDER — METHYLPREDNISOLONE SODIUM SUCC 125 MG IJ SOLR
125.0000 mg | Freq: Once | INTRAMUSCULAR | Status: AC
  Administered 2024-11-08: 125 mg via INTRAVENOUS
  Filled 2024-11-08: qty 2

## 2024-11-08 MED ORDER — IOHEXOL 350 MG/ML SOLN
60.0000 mL | Freq: Once | INTRAVENOUS | Status: AC | PRN
  Administered 2024-11-08: 60 mL via INTRAVENOUS

## 2024-11-08 MED ORDER — POTASSIUM CHLORIDE CRYS ER 20 MEQ PO TBCR
40.0000 meq | EXTENDED_RELEASE_TABLET | Freq: Once | ORAL | Status: AC
  Administered 2024-11-08: 40 meq via ORAL
  Filled 2024-11-08: qty 2

## 2024-11-08 MED ORDER — ALBUTEROL SULFATE (2.5 MG/3ML) 0.083% IN NEBU
2.5000 mg | INHALATION_SOLUTION | RESPIRATORY_TRACT | Status: DC | PRN
  Administered 2024-11-10: 2.5 mg via RESPIRATORY_TRACT
  Filled 2024-11-08: qty 3

## 2024-11-08 MED ORDER — KCL IN DEXTROSE-NACL 40-5-0.45 MEQ/L-%-% IV SOLN
INTRAVENOUS | Status: DC
  Filled 2024-11-08 (×2): qty 1000

## 2024-11-08 MED ORDER — ENOXAPARIN SODIUM 60 MG/0.6ML IJ SOSY
0.5000 mg/kg | PREFILLED_SYRINGE | INTRAMUSCULAR | Status: DC
  Administered 2024-11-08 – 2024-11-11 (×4): 45 mg via SUBCUTANEOUS
  Filled 2024-11-08 (×3): qty 0.6

## 2024-11-08 MED ORDER — BISACODYL 5 MG PO TBEC: 5.0000 mg | DELAYED_RELEASE_TABLET | Freq: Every day | ORAL | Status: DC | PRN

## 2024-11-08 MED ORDER — LACTULOSE 10 GM/15ML PO SOLN
10.0000 g | Freq: Once | ORAL | Status: AC
  Administered 2024-11-08: 10 g via ORAL
  Filled 2024-11-08: qty 30

## 2024-11-08 MED ORDER — MAGNESIUM SULFATE 2 GM/50ML IV SOLN
2.0000 g | Freq: Once | INTRAVENOUS | Status: AC
  Administered 2024-11-08: 2 g via INTRAVENOUS
  Filled 2024-11-08: qty 50

## 2024-11-08 MED ORDER — ONDANSETRON HCL 4 MG PO TABS: 4.0000 mg | ORAL_TABLET | Freq: Four times a day (QID) | ORAL | Status: DC | PRN

## 2024-11-08 MED ORDER — LACTATED RINGERS IV BOLUS
1000.0000 mL | Freq: Once | INTRAVENOUS | Status: AC
  Administered 2024-11-08: 1000 mL via INTRAVENOUS

## 2024-11-08 MED ORDER — ACETAMINOPHEN 650 MG RE SUPP: 650.0000 mg | Freq: Four times a day (QID) | RECTAL | Status: DC | PRN

## 2024-11-08 MED ORDER — IPRATROPIUM-ALBUTEROL 0.5-2.5 (3) MG/3ML IN SOLN
9.0000 mL | Freq: Once | RESPIRATORY_TRACT | Status: AC
  Administered 2024-11-08: 9 mL via RESPIRATORY_TRACT
  Filled 2024-11-08: qty 9

## 2024-11-08 MED ORDER — ACETAMINOPHEN 325 MG PO TABS
650.0000 mg | ORAL_TABLET | Freq: Four times a day (QID) | ORAL | Status: DC | PRN
  Administered 2024-11-10 – 2024-11-11 (×2): 650 mg via ORAL
  Filled 2024-11-08 (×2): qty 2

## 2024-11-08 MED ORDER — ONDANSETRON HCL 4 MG/2ML IJ SOLN: 4.0000 mg | Freq: Four times a day (QID) | INTRAMUSCULAR | Status: DC | PRN

## 2024-11-08 MED ORDER — SODIUM CHLORIDE 0.9 % IV SOLN
1.0000 g | INTRAVENOUS | Status: DC
  Administered 2024-11-08 – 2024-11-11 (×4): 1 g via INTRAVENOUS
  Filled 2024-11-08 (×4): qty 10

## 2024-11-08 MED ORDER — POLYETHYLENE GLYCOL 3350 17 G PO PACK: 17.0000 g | PACK | Freq: Every day | ORAL | Status: DC | PRN

## 2024-11-08 NOTE — Assessment & Plan Note (Signed)
 No history of hepatic disease noted.  Other LFTs normal. Negative for alcohol. Tylenol  level normal.  BUN normal w/ no evidence of GI bleed.  None of the medications in her medication list are associated with elevated ammonia level as a side effect. No evidence of cirrhosis/fibrosis/steatosis on CT abd on 11/2023.   - am ammonia level

## 2024-11-08 NOTE — H&P (Signed)
 " History and Physical    Teresa Lang FMW:[id number] DOB: December 20, 1955 DOA: 11/08/2024  DOS: the patient was seen and examined on 11/08/2024  PCP: Marikay Eva POUR, PA   Patient coming from: Home  I have personally briefly reviewed patient's old medical records in [facility]  No notes on file   ED Course:   History of Present Illness:   Patient presented to the emergency department after family noted increased lethargy and confusion this morning. Pt is a poor historian and family is no longer at bedside.  When asked what month it is pt states February (it is currently [facility]st).  When asked other questions like 'what year is it', or 'when is your birthday' she continues to answer 'February'   Per the ED note, pt had been more confused and lethargic since this morning. There was question regarding if pt had accidentally taken too much of her medication.   She reports feeling more tired and having headaches. Denies nausea or focal pain. No history of liver disease. She is on chronic supplemental oxygen at baseline. No additional symptoms volunteered during encounter. Patient lives with her sister, who assists with medications.   Laboratory data relevant for visit: - K? 2.9 mmol/L (low) - HCO? 33 mmol/L (elevated) - Creatinine 1.45 mg/dL (baseline 9.21) - Mg 1.6 mg/dL (low) - Ammonia 875 mol/L (elevated) - WBC normal - Hgb normal - Glucose normal - Urinalysis: small leukocyte esterase, few bacteria  Imaging data relevant for visit: - CT chest: debris in trachea and L main bronchus suspicious for aspiration with mucus plugging; consolidation in L lower lobe consistent with bronchopneumonia; underlying centrilobular emphysema.  Review of Systems:  Review of Systems  All other systems reviewed and are negative.   Past Medical History:  Diagnosis Date   Allergy    seasonal   Anxiety    Arthritis    Asthma    Cervico-occipital neuralgia of right side    Closed  displaced midcervical fracture of right femur (HCC)    COPD (chronic obstructive pulmonary disease) (HCC)    Coronary artery disease    Depression    Dyspnea    Encephalopathy    Fibromyalgia    GERD (gastroesophageal reflux disease)    Hyperlipidemia    Hypertension    Lumbar spondylosis    MDD (major depressive disorder)    Memory problem    Memory problem    MI (myocardial infarction) (HCC)    unsure type/year   Myofascial pain dysfunction syndrome    Osteoporosis    Pain syndrome, chronic    PTSD (post-traumatic stress disorder)    Rheumatoid arthritis involving multiple sites [facility])     Past Surgical History:  Procedure Laterality Date   ABDOMINAL HYSTERECTOMY  1983   BACK SURGERY     CERVICAL DISC SURGERY     CHOLECYSTECTOMY  1983   COLONOSCOPY N/A 08/25/2024   Procedure: COLONOSCOPY;  Surgeon: Maryruth Ole DASEN, MD;  Location: [facility] ENDOSCOPY;  Service: Endoscopy;  Laterality: N/A;   COLONOSCOPY WITH PROPOFOL  N/A 08/08/2022   Procedure: COLONOSCOPY WITH PROPOFOL ;  Surgeon: Maryruth Ole DASEN, MD;  Location: [facility] ENDOSCOPY;  Service: Endoscopy;  Laterality: N/A;   ESOPHAGOGASTRODUODENOSCOPY N/A 08/08/2022   Procedure: ESOPHAGOGASTRODUODENOSCOPY (EGD);  Surgeon: Maryruth Ole DASEN, MD;  Location: [facility] ENDOSCOPY;  Service: Endoscopy;  Laterality: N/A;   ESOPHAGOGASTRODUODENOSCOPY N/A 08/25/2024   Procedure: EGD (ESOPHAGOGASTRODUODENOSCOPY);  Surgeon: Maryruth Ole DASEN, MD;  Location: [facility] ENDOSCOPY;  Service: Endoscopy;  Laterality: N/A;  FRACTURE SURGERY     HIP ARTHROPLASTY Right 03/20/2021   Procedure: ARTHROPLASTY BIPOLAR HIP (HEMIARTHROPLASTY);  Surgeon: Edie Norleen PARAS, MD;  Location: [facility] ORS;  Service: Orthopedics;  Laterality: Right;   JOINT REPLACEMENT     LUMBAR LAMINECTOMY/DECOMPRESSION MICRODISCECTOMY Right 11/24/2019   Procedure: OPEN L3/4 LAMINECTOMY WITH CYST REMOVAL;  Surgeon: Bluford Standing, MD;  Location: [facility] ORS;  Service: Neurosurgery;  Laterality:  Right;   SPINE SURGERY  beginning 1994   laminectomies, fusions     reports that she has been smoking e-cigarettes. She has been exposed to tobacco smoke. She has never used smokeless tobacco. She reports that she does not currently use alcohol. She reports that she does not currently use drugs.  Allergies[1]  Family History  Problem Relation Age of Onset   Alcohol abuse Father    Celiac disease Sister    Drug abuse Maternal Aunt    Suicidality Maternal Aunt    Alcohol abuse Paternal Grandfather    Bipolar disorder Daughter    Drug abuse Son    Breast cancer Neg Hx     Prior to Admission medications  Medication Sig Start Date End Date Taking? Authorizing Provider  albuterol  (VENTOLIN  HFA) 108 (90 Base) MCG/ACT inhaler Inhale 2 puffs into the lungs every 6 (six) hours as needed for wheezing or shortness of breath. 03/27/19   Clapacs, Norleen DASEN, MD  aspirin  EC 81 MG tablet Take 81 mg by mouth.    [provider]  atorvastatin  (LIPITOR) 20 MG tablet Take 20 mg by mouth daily. 01/31/21   [provider]  Baclofen 5 MG TABS TAKE ONE TABLET BY MOUTH DAILY AT 9AM EVERY MORNING and TAKE TWO TABLETS BY MOUTH DAILY AT 9PM AT BEDTIME 07/05/22   [provider]  busPIRone  (BUSPAR ) 10 MG tablet Take 10 mg by mouth 3 (three) times daily.    [provider]  diclofenac  Sodium (VOLTAREN ) 1 % GEL APPLY 2 GRAMS TOPICALLY TO THE AFFECTED AREA FOUR TIMES DAILY. 05/08/24   [provider]  diltiazem  (CARDIZEM  CD) 240 MG 24 hr capsule Take 240 mg by mouth daily. 08/21/19   [provider]  DULoxetine  (CYMBALTA ) 60 MG capsule Take 60 mg by mouth daily.    [provider]  folic acid  (FOLVITE ) 1 MG tablet Take 1 tablet (1 mg total) by mouth daily. 03/27/19   Clapacs, Norleen DASEN, MD  guaiFENesin  (MUCINEX ) 600 MG 12 hr tablet Take 600 mg by mouth.    [provider]  hydroxychloroquine  (PLAQUENIL ) 200 MG tablet Take 200 mg by mouth daily. 05/23/22    [provider]  lisinopril  (ZESTRIL ) 20 MG tablet Take 1 tablet (20 mg total) by mouth daily. 11/27/23   Elgergawy, Brayton RAMAN, MD  methotrexate  (RHEUMATREX) 2.5 MG tablet Take 17.5 mg by mouth every Sunday.    [provider]  metoprolol  tartrate (LOPRESSOR ) 25 MG tablet Take 1 tablet (25 mg total) by mouth 2 (two) times daily. 12/07/23   Elgergawy, Brayton RAMAN, MD  omeprazole (PRILOSEC) 40 MG capsule Take 40 mg by mouth daily as needed (acid reflux symptoms).    [provider]  oxybutynin  (DITROPAN -XL) 10 MG 24 hr tablet Take 1 tablet (10 mg total) by mouth at bedtime. 05/12/24   Gaston Hamilton, MD  OXYGEN Inhale into the lungs. PRN    [provider]  predniSONE  (DELTASONE ) 5 MG tablet 6 po day one, decrease by 10mg  qd til gone. Patient not taking: Reported on 08/25/2024 05/08/24  [provider]  predniSONE  (STERAPRED UNI-PAK 21 TAB) 10 MG (21) TBPK tablet Take by mouth. 05/08/24   [provider]  QUEtiapine  (SEROQUEL ) 25 MG tablet Take 25 mg by mouth 3 (three) times daily.    [provider]  solifenacin  (VESICARE ) 5 MG tablet Take 1 tablet (5 mg total) by mouth daily. 05/12/24   Gaston Hamilton, MD  sulfamethoxazole-trimethoprim (BACTRIM) 400-80 MG tablet Take 1 tablet by mouth 3 (three) times a week. Patient not taking: Reported on 08/25/2024 04/17/24   [provider]  traZODone  (DESYREL ) 100 MG tablet Take 100 mg by mouth at bedtime.    [provider]  TRELEGY ELLIPTA 100-62.5-25 MCG/INH AEPB Inhale 2 puffs into the lungs daily. Patient not taking: No sig reported 05/22/19   [provider]    Physical Exam: Vitals:   11/08/24 1211 11/08/24 1216 11/08/24 1710 11/08/24 2052  BP:   (!) 140/81 (!) 116/55  Pulse:   64 75  Resp:   20 20  Temp: 98.4 F (36.9 C)  97.8 F (36.6 C) 97.9 F (36.6 C)  TempSrc: Oral  Oral Oral  SpO2:  95% 93% 93%  Weight:      Height:        Physical  Exam Constitutional:      Comments: Lethargic, oriented to person.   HENT:     Head: Normocephalic.     Mouth/Throat:     Mouth: Mucous membranes are moist.     Pharynx: Oropharynx is clear.  Eyes:     Pupils: Pupils are equal, round, and reactive to light.  Cardiovascular:     Rate and Rhythm: Normal rate and regular rhythm.  Pulmonary:     Breath sounds: Wheezing and rhonchi present.  Abdominal:     Palpations: Abdomen is soft.     Tenderness: There is no abdominal tenderness.  Musculoskeletal:     Right lower leg: No edema.     Left lower leg: No edema.  Skin:    General: Skin is warm and dry.  Neurological:     General: No focal deficit present.     Mental Status: She is disoriented.      Labs on Admission: I have personally reviewed following labs and imaging studies  CBC: Recent Labs  Lab 11/08/24 1206  WBC 10.4  HGB 15.4*  HCT 46.8*  MCV 95.3  PLT 370   Basic Metabolic Panel: Recent Labs  Lab 11/08/24 1206  NA 139  K 2.9*  CL 92*  CO2 33*  GLUCOSE 131*  BUN 22  CREATININE 1.45*  CALCIUM  9.9  MG 1.6*  PHOS 3.9   GFR: Estimated Creatinine Clearance: 40.2 mL/min (A) (by C-G formula based on SCr of 1.45 mg/dL (H)). Liver Function Tests: Recent Labs  Lab 11/08/24 1206  AST 39  ALT 27  ALKPHOS 73  BILITOT 0.7  PROT 7.1  ALBUMIN 4.1   No results for input(s): LIPASE, AMYLASE in the last 168 hours. Recent Labs  Lab 11/08/24 1740  AMMONIA 124*   Coagulation Profile: No results for input(s): INR, PROTIME in the last 168 hours. Cardiac Enzymes: No results for input(s): CKTOTAL, CKMB, CKMBINDEX, TROPONINI, TROPONINIHS in the last 168 hours. ProBNP, BNP (last 5 results) Recent Labs    08/09/24 2302  BNP 95.9   HbA1C: No results for input(s): HGBA1C in the last 72 hours. CBG: Recent Labs  Lab 11/08/24 1203 11/08/24 1830  GLUCAP 127* 134*   Lipid Profile: No results for  input(s): CHOL, HDL, LDLCALC,  TRIG, CHOLHDL, LDLDIRECT in the last 72 hours. Thyroid Function Tests: No results for input(s): TSH, T4TOTAL, FREET4, T3FREE, THYROIDAB in the last 72 hours. Anemia Panel: No results for input(s): VITAMINB12, FOLATE, FERRITIN, TIBC, IRON, RETICCTPCT in the last 72 hours. Urine analysis:    Component Value Date/Time   COLORURINE YELLOW (A) 11/08/2024 1740   APPEARANCEUR CLOUDY (A) 11/08/2024 1740   APPEARANCEUR Clear 05/12/2024 0831   LABSPEC 1.017 11/08/2024 1740   PHURINE 5.0 11/08/2024 1740   GLUCOSEU NEGATIVE 11/08/2024 1740   HGBUR NEGATIVE 11/08/2024 1740   BILIRUBINUR NEGATIVE 11/08/2024 1740   BILIRUBINUR Negative 05/12/2024 0831   KETONESUR NEGATIVE 11/08/2024 1740   PROTEINUR NEGATIVE 11/08/2024 1740   NITRITE NEGATIVE 11/08/2024 1740   LEUKOCYTESUR SMALL (A) 11/08/2024 1740    Radiological Exams on Admission: I have personally reviewed images CT Angio Chest PE W/Cm &/Or Wo Cm Result Date: 11/08/2024 EXAM: CTA of the Chest with contrast for PE 11/08/2024 06:09:57 PM TECHNIQUE: CTA of the chest was performed after the administration of 60 mL of iohexol  (OMNIPAQUE ) 350 MG/ML injection. Multiplanar reformatted images are provided for review. MIP images are provided for review. Automated exposure control, iterative reconstruction, and/or weight based adjustment of the mA/kV was utilized to reduce the radiation dose to as low as reasonably achievable. COMPARISON: 11/08/2024 and CTA 08/09/2024. CLINICAL HISTORY: Pulmonary embolism (PE) suspected, high probability. Altered mental status. Labored respirations. FINDINGS: PULMONARY ARTERIES: Pulmonary arteries are adequately opacified for evaluation. No pulmonary embolism. Main pulmonary artery is normal in caliber. MEDIASTINUM: The heart and pericardium demonstrate no acute abnormality. There is no acute abnormality of the thoracic aorta. Small pericardial effusion. LYMPH NODES: No mediastinal, hilar or axillary  lymphadenopathy. LUNGS AND PLEURA: Centrilobular emphysema. Bronchial wall thickening with peribronchovascular consolidation and scattered centrilobular micronodules in the lower lobes. Layering debris in the trachea extending into the left mainstem bronchus. Mucous plugging in the lower lobe bronchi. 1.5 cm nodule in the left lower lobe (series 6 image 101). No pleural effusion or pneumothorax. UPPER ABDOMEN: Limited images of the upper abdomen are unremarkable. SOFT TISSUES AND BONES: No acute bone or soft tissue abnormality. IMPRESSION: 1. No pulmonary embolism. 2. Debris in the trachea and left mainstem bronchus suspicious for aspiration. 3. Mucus plugging, and peribronchovascular consolidation in the lower lobes suggesting bronchopneumonia. 4. 1.5 cm left lower lobe solid pulmonary nodule is likely infectious / inflammatory. However, as per Fleischner Society Guidelines, noncontrast follow up CT at 3 months is recommended. 5. Centrilobular emphysema; pulmonary emphysema is an independent risk factor for lung cancer. Recommend consideration for evaluation for a low-dose CT lung cancer screening program. Electronically signed by: Norman Gatlin MD 11/08/2024 06:22 PM EST RP Workstation: [id number]   CT Head Wo Contrast Result Date: 11/08/2024 CLINICAL DATA:  Altered level of consciousness EXAM: CT HEAD WITHOUT CONTRAST TECHNIQUE: Contiguous axial images were obtained from the base of the skull through the vertex without intravenous contrast. RADIATION DOSE REDUCTION: This exam was performed according to the departmental dose-optimization program which includes automated exposure control, adjustment of the mA and/or kV according to patient size and/or use of iterative reconstruction technique. COMPARISON:  11/29/2023 FINDINGS: Brain: No acute infarct or hemorrhage. Lateral ventricles and midline structures are unremarkable. No acute extra-axial fluid collections. No mass effect. Vascular: No hyperdense vessel  or unexpected calcification. Skull: Normal. Negative for fracture or focal lesion. Sinuses/Orbits: No acute finding. Other: None. IMPRESSION: 1. No acute intracranial process. Electronically Signed   By: Ozell  Delores M.D.   On: 11/08/2024 17:22   DG Chest 2 View Result Date: 11/08/2024 EXAM: 2 VIEW(S) XRAY OF THE CHEST 11/08/2024 12:38:00 PM COMPARISON: 10/01/2024 CLINICAL HISTORY: Altered mental status and hypoxia. FINDINGS: LUNGS AND PLEURA: Streaky airspace opacities within lung bases. No pleural effusion. No pneumothorax. HEART AND MEDIASTINUM: Aortic calcification. BONES AND SOFT TISSUES: Cervical spine surgical hardware. IMPRESSION: 1. Streaky airspace opacities within the lung bases, likely atelectasis versus scar. Electronically signed by: Waddell Calk MD 11/08/2024 01:02 PM EST RP Workstation: [id number]    EKG: My personal interpretation of EKG shows: NSR    Assessment/Plan Principal Problem:   Pneumonia Active Problems:   Chronic obstructive pulmonary disease, unspecified (HCC)   AMS (altered mental status)   Hyperammonemia   Hypokalemia    Assessment and Plan: * Pneumonia Ct chest notes likely PNA w/ possible aspiration.  - ctx and azithromycin  - iv steroids - speech consult d/t possible aspiration.  NPO until speech eval.    Hypokalemia 2.9 w/ hypomagnesemia - replete K and Mg - daily bmp and Mg .   Hyperammonemia No history of hepatic disease noted.  Other LFTs normal. Negative for alcohol. Tylenol  level normal.  BUN normal w/ no evidence of GI bleed.  None of the medications in her medication list are associated with elevated ammonia level as a side effect. No evidence of cirrhosis/fibrosis/steatosis on CT abd on 11/2023.   - am ammonia level   AMS (altered mental status) Confused today. No focal deficits noted.  Last known normal was last night.  Possibly 2/2 pneumonia.  Does not appear to be d/t hypercapnia.  Vbg shows mildly elevated co2 w/ normal pH.   Possibly d/t accidental medication overdose.  Will hold any medications that could cause altered mental status tomorrow.   - treating PNA - holding home meds that could be contributing. Restart as able when condition improves. .  - delirium precautions  Chronic obstructive pulmonary disease, unspecified (HCC) Some low pitched wheezing appreciated on exam.  Treating as copd exacerbation 2/2 PNA.   - ctx and azithromycin  - iv steroids, transition to po as able.  - albuterol  prn       DVT prophylaxis: {Blank single:19197::Lovenox ,SQ Heparin,IV heparin gtts,Xarelto,Eliquis,Coumadin,SCDs,***} Code Status: {Blank single:19197::Full Code,DNR with Intubation,DNR/DNI(Do NOT Intubate),Comfort Care,***} Family Communication: ***  Disposition Plan: ***  Consults called: ***  Admission status: {Blank single:19197::Observation,Inpatient}, {Blank single:19197::Med-Surg,Telemetry bed,Step Down Unit}   Toribio MARLA Slain, MD [facility] 11/08/2024, 10:26 PM       [1]  Allergies Allergen Reactions   Prednisone  Anxiety   "

## 2024-11-08 NOTE — ED Notes (Signed)
 Spoke with patients sister, Randine Penner, to give an update on plan of care.

## 2024-11-08 NOTE — Assessment & Plan Note (Signed)
 Some low pitched wheezing appreciated on exam.  Treating as copd exacerbation 2/2 PNA.   - ctx and azithromycin  - iv steroids, transition to po as able.  - albuterol  prn

## 2024-11-08 NOTE — ED Notes (Signed)
 Patient to CT

## 2024-11-08 NOTE — ED Provider Notes (Signed)
 Teresa Lang Provider Note    Event Date/Time   First MD Initiated Contact with Patient 11/08/24 1556     (approximate)   History   Altered Mental Status   HPI  Teresa Lang is a 69 y.o. female with history of COPD on 2 L oxygen as needed, history of chronic pain, MDD, RA, presenting with altered mental status.  Family had noted that she was altered this morning while eating breakfast.  She is oriented to time place and self but disoriented to situation.  Family is concerned that she possibly took her medications for tonight this morning or last night because those pills are missing from a pill block this morning.  Per independent history from family, her last known well was at 8 PM.  States that she does take nightly trazodone , BuSpar , Seroquel .  States that her pills from tonight are missing.  States that she also has some codeine with her and does not know if she has taken extra.  Also unclear if she has taken any extra Tylenol  since patient takes it for headaches and keeps it in her bag..  On independent chart review, she was seen by primary care in early January, was there for follow-up after a COPD exacerbation, was also seen by rheumatology, is on methotrexate , Plaquenil , folic acid .  Based on medications list, she is on BuSpar , Seroquel , trazodone .     Physical Exam   Triage Vital Signs: ED Triage Vitals  Encounter Vitals Group     BP 11/08/24 1203 (!) 120/59     Girls Systolic BP Percentile --      Girls Diastolic BP Percentile --      Boys Systolic BP Percentile --      Boys Diastolic BP Percentile --      Pulse Rate 11/08/24 1203 88     Resp 11/08/24 1203 20     Temp 11/08/24 1211 98.4 F (36.9 C)     Temp Source 11/08/24 1203 Oral     SpO2 11/08/24 1203 (!) 85 %     Weight 11/08/24 1205 200 lb (90.7 kg)     Height 11/08/24 1205 5' 5 (1.651 m)     Head Circumference --      Peak Flow --      Pain Score 11/08/24 1158 0     Pain Loc --       Pain Education --      Exclude from Growth Chart --     Most recent vital signs: Vitals:   11/08/24 1216 11/08/24 1710  BP:  (!) 140/81  Pulse:  64  Resp:  20  Temp:  97.8 F (36.6 C)  SpO2: 95% 93%     General: Awake, no distress.  ANO x 2 CV:  Good peripheral perfusion.  Resp:  Normal effort.  Expiratory wheezing bilaterally Abd:  No distention.  Soft nontender Other:  Trace lower extremity edema, pupils are equal, extraocular movements are intact, no facial droop, no focal weakness or numbness, speech does not sound overtly slurred but she is falling asleep while talking to me.   ED Results / Procedures / Treatments   Labs (all labs ordered are listed, but only abnormal results are displayed) Labs Reviewed  COMPREHENSIVE METABOLIC PANEL WITH GFR - Abnormal; Notable for the following components:      Result Value   Potassium 2.9 (*)    Chloride 92 (*)    CO2 33 (*)  Glucose, Bld 131 (*)    Creatinine, Ser 1.45 (*)    GFR, Estimated 39 (*)    All other components within normal limits  CBC - Abnormal; Notable for the following components:   Hemoglobin 15.4 (*)    HCT 46.8 (*)    All other components within normal limits  URINALYSIS, ROUTINE W REFLEX MICROSCOPIC - Abnormal; Notable for the following components:   Color, Urine YELLOW (*)    APPearance CLOUDY (*)    Leukocytes,Ua SMALL (*)    Bacteria, UA FEW (*)    All other components within normal limits  URINE DRUG SCREEN - Abnormal; Notable for the following components:   Opiates POSITIVE (*)    All other components within normal limits  MAGNESIUM  - Abnormal; Notable for the following components:   Magnesium  1.6 (*)    All other components within normal limits  BLOOD GAS, VENOUS - Abnormal; Notable for the following components:   pCO2, Ven 67 (*)    Bicarbonate 38.7 (*)    Acid-Base Excess 10.6 (*)    All other components within normal limits  AMMONIA - Abnormal; Notable for the following  components:   Ammonia 124 (*)    All other components within normal limits  ACETAMINOPHEN  LEVEL - Abnormal; Notable for the following components:   Acetaminophen  (Tylenol ), Serum <10 (*)    All other components within normal limits  CBG MONITORING, ED - Abnormal; Notable for the following components:   Glucose-Capillary 127 (*)    All other components within normal limits  CBG MONITORING, ED - Abnormal; Notable for the following components:   Glucose-Capillary 134 (*)    All other components within normal limits  TROPONIN T, HIGH SENSITIVITY - Abnormal; Notable for the following components:   Troponin T High Sensitivity 103 (*)    All other components within normal limits  TROPONIN T, HIGH SENSITIVITY - Abnormal; Notable for the following components:   Troponin T High Sensitivity 64 (*)    All other components within normal limits  PHOSPHORUS  ETHANOL     EKG  EKG shows, sinus rhythm, rate 67, normal QS, normal QTc, no obvious ischemic ST elevation, T wave flattening to 1, aVL, not significantly changed compared to prior   RADIOLOGY On my independent interpretation, CT without obvious intracranial hemorrhage   PROCEDURES:  Critical Care performed: Yes, see critical care procedure note(s)  .Critical Care  Performed by: Waymond Lorelle Cummins, MD Authorized by: Waymond Lorelle Cummins, MD   Critical care provider statement:    Critical care time (minutes):  40   Critical care was time spent personally by me on the following activities:  Development of treatment plan with patient or surrogate, discussions with consultants, evaluation of patient's response to treatment, examination of patient, ordering and review of laboratory studies, ordering and review of radiographic studies, ordering and performing treatments and interventions, pulse oximetry, re-evaluation of patient's condition and review of old charts    MEDICATIONS ORDERED IN ED: Medications  potassium chloride  SA (KLOR-CON  M) CR  tablet 40 mEq (40 mEq Oral Given 11/08/24 1724)  lactated ringers  bolus 1,000 mL (0 mLs Intravenous Stopped 11/08/24 1853)  ipratropium-albuterol  (DUONEB) 0.5-2.5 (3) MG/3ML nebulizer solution 9 mL (9 mLs Nebulization Given 11/08/24 1730)  methylPREDNISolone  sodium succinate (SOLU-MEDROL ) 125 mg/2 mL injection 125 mg (125 mg Intravenous Given 11/08/24 1729)  magnesium  sulfate IVPB 2 g 50 mL (0 g Intravenous Stopped 11/08/24 1837)  iohexol  (OMNIPAQUE ) 350 MG/ML injection 60 mL (60 mLs Intravenous  Contrast Given 11/08/24 1809)  lactulose  (CHRONULAC ) 10 GM/15ML solution 10 g (10 g Oral Given 11/08/24 1920)     IMPRESSION / MDM / ASSESSMENT AND PLAN / ED COURSE  I reviewed the triage vital signs and the nursing notes.                              Differential diagnosis includes, but is not limited to, medication overdose, medication side effects, polypharmacy, COPD exacerbation, pneumonia, viral illness, electrolyte derangements, atypical ACS, no reported trauma or falls, but will get CT head.  No focal deficits at this time to suggest CVA.  Last known well was at 8 PM last night.  Did also consider hypercarbia.  Labs, EKG, troponin, chest x-ray, CT head, UA, Tylenol  level, urine drug screen.  Will give her some DuoNebs, Solu-Medrol  here.  She will need to be admitted for further management.  Patient's presentation is most consistent with acute presentation with potential threat to life or bodily function.  Independent interpretation of labs and imaging below. Her mental status change, possibly taking extra sedating meds that are scheduled for tonight, electrolyte derangements, hyperammonemia, she will need to be admitted for further management.  Consulted hospitalist, she is admitted.  The patient is on the cardiac monitor to evaluate for evidence of arrhythmia and/or significant heart rate changes.   Clinical Course as of 11/08/24 1927  Sat Nov 08, 2024  1601 DG Chest 2 View IMPRESSION: 1.  Streaky airspace opacities within the lung bases, likely atelectasis versus scar.   [TT]  1719 Independent review of labs, she is hypokalemic, hypomagnesemic, mild AKI, LFTs are normal, no leukocytosis, phosphorus is normal, troponin is elevated but she has no chest pain, could be demand [TT]  1807 CT Head Wo Contrast 1. No acute intracranial process.  [TT]  1824 Blood gas, venous(!) pCO2 is mildly elevated but she is not acutely retaining.  pH is normal. [TT]  1908 Repeat troponin downtrending, she is hyperammonemic, unclear why given that she has no history of cirrhosis, will give her some lactulose .  Opiate positive.  Tylenol  negative.  Given her mental status, will plan to bring her in for further management. [TT]    Clinical Course User Index [TT] Waymond Lorelle Cummins, MD     FINAL CLINICAL IMPRESSION(S) / ED DIAGNOSES   Final diagnoses:  Altered mental status, unspecified altered mental status type  Hyperammonemia  COPD exacerbation (HCC)  Hypokalemia  Hypomagnesemia  Elevated troponin  AKI (acute kidney injury)     Rx / DC Orders   ED Discharge Orders     None        Note:  This document was prepared using Dragon voice recognition software and may include unintentional dictation errors.    Waymond Lorelle Cummins, MD 11/08/24 [phone]

## 2024-11-08 NOTE — Assessment & Plan Note (Signed)
 Confused today. No focal deficits noted.  Last known normal was last night.  Possibly 2/2 pneumonia.  Does not appear to be d/t hypercapnia.  Vbg shows mildly elevated co2 w/ normal pH.  Possibly d/t accidental medication overdose.  Will hold any medications that could cause altered mental status tomorrow.   - treating PNA - holding home meds that could be contributing. Restart as able when condition improves. .  - delirium precautions

## 2024-11-08 NOTE — ED Notes (Signed)
 Date and time results received: 11/08/24 1717 Test: Troponin Critical Value: 103  Name of Provider Notified: MD Waymond

## 2024-11-08 NOTE — ED Triage Notes (Signed)
 Pt to ED with family member for AMS since this morning which they noticed while eating breakfast. Pt is oriented to time, place, self and disoriented to situation. Pt has slowed verbal responses. Family member states it's possible she took tonight's pills this AM or last night because tonight's pills were missing in pill box this morning. Uses oxygen PRN. SPO2 85-88% on RA, placed on 2L. Prolonged exhalation & slightly labored respirations & hx COPD, family member states this is her baseline. CBG 127.

## 2024-11-08 NOTE — Assessment & Plan Note (Signed)
 Ct chest notes likely PNA w/ possible aspiration.  - ctx and azithromycin  - iv steroids - speech consult d/t possible aspiration.  NPO until speech eval.

## 2024-11-08 NOTE — ED Notes (Signed)
 Sent grey for lactic in case ordered.

## 2024-11-08 NOTE — Assessment & Plan Note (Signed)
 2.9 w/ hypomagnesemia - replete K and Mg - daily bmp and Mg .

## 2024-11-09 DIAGNOSIS — E876 Hypokalemia: Secondary | ICD-10-CM | POA: Diagnosis not present

## 2024-11-09 DIAGNOSIS — J189 Pneumonia, unspecified organism: Secondary | ICD-10-CM | POA: Diagnosis not present

## 2024-11-09 DIAGNOSIS — R4182 Altered mental status, unspecified: Secondary | ICD-10-CM

## 2024-11-09 DIAGNOSIS — J441 Chronic obstructive pulmonary disease with (acute) exacerbation: Secondary | ICD-10-CM

## 2024-11-09 DIAGNOSIS — E7220 Disorder of urea cycle metabolism, unspecified: Secondary | ICD-10-CM

## 2024-11-09 LAB — CBC
HCT: 48.7 % — ABNORMAL HIGH (ref 36.0–46.0)
Hemoglobin: 16.2 g/dL — ABNORMAL HIGH (ref 12.0–15.0)
MCH: 31.8 pg (ref 26.0–34.0)
MCHC: 33.3 g/dL (ref 30.0–36.0)
MCV: 95.7 fL (ref 80.0–100.0)
Platelets: 453 10*3/uL — ABNORMAL HIGH (ref 150–400)
RBC: 5.09 MIL/uL (ref 3.87–5.11)
RDW: 13.8 % (ref 11.5–15.5)
WBC: 8.7 10*3/uL (ref 4.0–10.5)
nRBC: 0 % (ref 0.0–0.2)

## 2024-11-09 LAB — COMPREHENSIVE METABOLIC PANEL WITH GFR
ALT: 30 U/L (ref 0–44)
AST: 42 U/L — ABNORMAL HIGH (ref 15–41)
Albumin: 4.4 g/dL (ref 3.5–5.0)
Alkaline Phosphatase: 83 U/L (ref 38–126)
Anion gap: 20 — ABNORMAL HIGH (ref 5–15)
BUN: 24 mg/dL — ABNORMAL HIGH (ref 8–23)
CO2: 27 mmol/L (ref 22–32)
Calcium: 9.5 mg/dL (ref 8.9–10.3)
Chloride: 93 mmol/L — ABNORMAL LOW (ref 98–111)
Creatinine, Ser: 1.52 mg/dL — ABNORMAL HIGH (ref 0.44–1.00)
GFR, Estimated: 36 mL/min — ABNORMAL LOW
Glucose, Bld: 188 mg/dL — ABNORMAL HIGH (ref 70–99)
Potassium: 3.5 mmol/L (ref 3.5–5.1)
Sodium: 140 mmol/L (ref 135–145)
Total Bilirubin: 0.7 mg/dL (ref 0.0–1.2)
Total Protein: 7.6 g/dL (ref 6.5–8.1)

## 2024-11-09 LAB — PROTIME-INR
INR: 1.1 (ref 0.8–1.2)
Prothrombin Time: 14.8 s (ref 11.4–15.2)

## 2024-11-09 LAB — MAGNESIUM: Magnesium: 2.5 mg/dL — ABNORMAL HIGH (ref 1.7–2.4)

## 2024-11-09 LAB — AMMONIA: Ammonia: 98 umol/L — ABNORMAL HIGH (ref 9–35)

## 2024-11-09 MED ORDER — METHYLPREDNISOLONE SODIUM SUCC 40 MG IJ SOLR
40.0000 mg | Freq: Every day | INTRAMUSCULAR | Status: DC
  Administered 2024-11-10 – 2024-11-11 (×2): 40 mg via INTRAVENOUS
  Filled 2024-11-09 (×3): qty 1

## 2024-11-09 MED ORDER — LORAZEPAM 2 MG/ML IJ SOLN
2.0000 mg | Freq: Once | INTRAMUSCULAR | Status: AC
  Administered 2024-11-09: 2 mg via INTRAVENOUS
  Filled 2024-11-09: qty 1

## 2024-11-09 NOTE — Evaluation (Addendum)
 Clinical/Bedside Swallow Evaluation Patient Details  Name: Teresa Lang MRN: [id number] Date of Birth: 1955-11-11  Today's Date: 11/09/2024 Time: SLP Start Time (ACUTE ONLY): 1000 SLP Stop Time (ACUTE ONLY): 1027 SLP Time Calculation (min) (ACUTE ONLY): 27 min  Past Medical History:  Past Medical History:  Diagnosis Date   Allergy    seasonal   Anxiety    Arthritis    Asthma    Cervico-occipital neuralgia of right side    Closed displaced midcervical fracture of right femur (HCC)    COPD (chronic obstructive pulmonary disease) (HCC)    Coronary artery disease    Depression    Dyspnea    Encephalopathy    Fibromyalgia    GERD (gastroesophageal reflux disease)    Hyperlipidemia    Hypertension    Lumbar spondylosis    MDD (major depressive disorder)    Memory problem    Memory problem    MI (myocardial infarction) (HCC)    unsure type/year   Myofascial pain dysfunction syndrome    Osteoporosis    Pain syndrome, chronic    PTSD (post-traumatic stress disorder)    Rheumatoid arthritis involving multiple sites [facility])    Past Surgical History:  Past Surgical History:  Procedure Laterality Date   ABDOMINAL HYSTERECTOMY  1983   BACK SURGERY     CERVICAL DISC SURGERY     CHOLECYSTECTOMY  1983   COLONOSCOPY N/A 08/25/2024   Procedure: COLONOSCOPY;  Surgeon: Maryruth Ole DASEN, MD;  Location: [facility] ENDOSCOPY;  Service: Endoscopy;  Laterality: N/A;   COLONOSCOPY WITH PROPOFOL  N/A 08/08/2022   Procedure: COLONOSCOPY WITH PROPOFOL ;  Surgeon: Maryruth Ole DASEN, MD;  Location: [facility] ENDOSCOPY;  Service: Endoscopy;  Laterality: N/A;   ESOPHAGOGASTRODUODENOSCOPY N/A 08/08/2022   Procedure: ESOPHAGOGASTRODUODENOSCOPY (EGD);  Surgeon: Maryruth Ole DASEN, MD;  Location: [facility] ENDOSCOPY;  Service: Endoscopy;  Laterality: N/A;   ESOPHAGOGASTRODUODENOSCOPY N/A 08/25/2024   Procedure: EGD (ESOPHAGOGASTRODUODENOSCOPY);  Surgeon: Maryruth Ole DASEN, MD;  Location: [facility] ENDOSCOPY;  Service:  Endoscopy;  Laterality: N/A;   FRACTURE SURGERY     HIP ARTHROPLASTY Right 03/20/2021   Procedure: ARTHROPLASTY BIPOLAR HIP (HEMIARTHROPLASTY);  Surgeon: Edie Norleen PARAS, MD;  Location: [facility] ORS;  Service: Orthopedics;  Laterality: Right;   JOINT REPLACEMENT     LUMBAR LAMINECTOMY/DECOMPRESSION MICRODISCECTOMY Right 11/24/2019   Procedure: OPEN L3/4 LAMINECTOMY WITH CYST REMOVAL;  Surgeon: Bluford Standing, MD;  Location: [facility] ORS;  Service: Neurosurgery;  Laterality: Right;   SPINE SURGERY  beginning 1994   laminectomies, fusions   HPI:  Teresa Lang is a 69 y.o. female with history of COPD on 2 L oxygen as needed, history of chronic pain, MDD, RA, presenting with altered mental status.  Family had noted that she was altered this morning while eating breakfast.  She is oriented to time place and self but disoriented to situation.  Family is concerned that she possibly took her medications for tonight this morning or last night because those pills are missing from a pill block this morning.     Per independent history from family, her last known well was at 8 PM.  States that she does take nightly trazodone , BuSpar , Seroquel .  States that her pills from tonight are missing.  States that she also has some codeine with her and does not know if she has taken extra.  Also unclear if she has taken any extra Tylenol  since patient takes it for headaches and keeps it in her bag..      Chest CT  on 11/08/2024 revealed 1. No pulmonary embolism.  2. Debris in the trachea and left mainstem bronchus suspicious for aspiration.  3. Mucus plugging, and peribronchovascular consolidation in the lower lobes  suggesting bronchopneumonia.  4. 1.5 cm left lower lobe solid pulmonary nodule is likely infectious /  inflammatory.   Per last neurology note dated 08/11/2024 pt with rapidly progressive dementia in the setting of long-standing anxiety and depression, mild-tmoderate white matter microvascular ischemic and metabolic  changes - per chart family manages finances, mediccations and has disabled her vehicle d/t concern with driving. Per chart, pt lives with her sister, who manages her mediciations.     Assessment / Plan / Recommendation  Clinical Impression  Pt was awake with O2 tubing down around her chin with this clinical research associate entered her room. Pt was observant (noted that I had a cup of water in my hand), pleasant and appropriate. As she spoke, her work of breathing appeared to increase, this clinical research associate replaced pt's O2 probe with sats reading 84%. With encouragement, pt able to tolerate nasal cannula placed in her nose. This writer went to retrieve another O2 sensor and when returning to pt's room, pt became very emotional, crying and looking behind her for someone. She appeared paranoid and protective of all items including O2 tubing. Pt with difficulty breathing but unable to tolerate placement of nasal cannula. MD present and also allowed pt to speak with her sister over the phone. Pt also allowed to consume some thin water via cup sips despite not have O2 in place (pt had already seen the cup and was stated she was thirsty). With emotional support, pt continued to struggle with paranoia, crying and was not able to be redirected to task for safe consumption of POs. At this time, would continue sips with medicines and ST to follow for further diet advancement.   SLP Visit Diagnosis: Dysphagia, unspecified (R13.10)    Aspiration Risk  Moderate aspiration risk;Severe aspiration risk;Risk for inadequate nutrition/hydration    Diet Recommendation    NPO with sips with medicines       Other Recommendations Oral Care Recommendations: Oral care QID     Swallow Evaluation Recommendations Recommendations: NPO except meds Medication Administration: Crushed with puree Supervision: Full supervision/cueing for swallowing strategies Swallowing strategies  : Minimize environmental distractions;Slow rate;Small  bites/sips Postural changes: Position pt fully upright for meals;Stay upright 30-60 min after meals Oral care recommendations: Oral care QID (4x/day)   Assistance Recommended at Discharge    Functional Status Assessment Patient has had a recent decline in their functional status and/or demonstrates limited ability to make significant improvements in function in a reasonable and predictable amount of time  Frequency and Duration min 2x/week  2 weeks       Prognosis Prognosis for improved oropharyngeal function: Fair Barriers to Reach Goals: Cognitive deficits;Motivation;Time post onset;Severity of deficits;Behavior;Medication      Swallow Study   General Date of Onset: 11/08/24 HPI: ADRIENA MANFRE is a 69 y.o. female with history of COPD on 2 L oxygen as needed, history of chronic pain, MDD, RA, presenting with altered mental status.  Family had noted that she was altered this morning while eating breakfast.  She is oriented to time place and self but disoriented to situation.  Family is concerned that she possibly took her medications for tonight this morning or last night because those pills are missing from a pill block this morning.     Per independent history from family, her last known well  was at 8 PM.  States that she does take nightly trazodone , BuSpar , Seroquel .  States that her pills from tonight are missing.  States that she also has some codeine with her and does not know if she has taken extra.  Also unclear if she has taken any extra Tylenol  since patient takes it for headaches and keeps it in her bag..    Chest CT on 11/08/2024 revealed 1. No pulmonary embolism.  2. Debris in the trachea and left mainstem bronchus suspicious for aspiration.  3. Mucus plugging, and peribronchovascular consolidation in the lower lobes  suggesting bronchopneumonia.  4. 1.5 cm left lower lobe solid pulmonary nodule is likely infectious /  inflammatory. Type of Study: Bedside Swallow Evaluation Previous  Swallow Assessment: none in chart Diet Prior to this Study: NPO Temperature Spikes Noted: No Respiratory Status: Nasal cannula (pt had difficulty leaving it on) History of Recent Intubation: No Behavior/Cognition: Alert;Confused;Agitated;Uncooperative;Distractible;Doesn't follow directions (became emotionally distraught) Oral Cavity Assessment: Dry Oral Care Completed by SLP: Recent completion by staff Oral Cavity - Dentition: Edentulous Vision: Functional for self-feeding Self-Feeding Abilities: Needs assist Patient Positioning: Upright in bed Baseline Vocal Quality: Normal Volitional Cough: Cognitively unable to elicit Volitional Swallow: Unable to elicit    Oral/Motor/Sensory Function Overall Oral Motor/Sensory Function: Within functional limits   Ice Chips Ice chips: Not tested   Thin Liquid Thin Liquid: Within functional limits Presentation: Cup;Self Fed    Nectar Thick Nectar Thick Liquid: Not tested   Honey Thick Honey Thick Liquid: Not tested   Puree Puree: Not tested   Solid     Solid: Not tested     Neima Lacross B. Rubbie, M.S., CCC-SLP, Tree Surgeon Certified Brain Injury Specialist [facility]  [facility] Office [phone] Ascom [phone] Fax [phone]

## 2024-11-09 NOTE — Progress Notes (Signed)
 [facility]  - [place] at [facility]   PATIENT NAME: Teresa Lang    MR#:  [id number]  DATE OF BIRTH:  May 25, 1956  SUBJECTIVE:  No family at bedside Pt is tearful and will not give me any information. She is refusing meds or any form of help treatment. SLP at beds. We tried for her to wear her oxygen since sats were dropping in the 80's. Pt refused and tells menot to force her to do anything Spoke with sister Randine who spoke with pt on the phone and pt still denies to wear oxygen or take her meds. Sister on the way Wet and coarse cough   VITALS:  Blood pressure (!) 161/109, pulse 92, temperature 98 F (36.7 C), temperature source Oral, resp. rate (!) 22, height 5' 5 (1.651 m), weight 90.7 kg, SpO2 95%.  PHYSICAL EXAMINATION:  Limited due to pt being uncooperative GENERAL:  69 y.o.-year-old patient with acute distress. disheveled LUNGS: Normal breath sounds bilaterally, no wheezing CARDIOVASCULAR: S1, S2 normal. No murmur   EXTREMITIES: No  edema b/l.    NEUROLOGIC awake and alert--cannot do further testing  LABORATORY PANEL:  CBC Recent Labs  Lab 11/09/24 0409  WBC 8.7  HGB 16.2*  HCT 48.7*  PLT 453*    Chemistries  Recent Labs  Lab 11/09/24 0409  NA 140  K 3.5  CL 93*  CO2 27  GLUCOSE 188*  BUN 24*  CREATININE 1.52*  CALCIUM  9.5  MG 2.5*  AST 42*  ALT 30  ALKPHOS 83  BILITOT 0.7   Cardiac Enzymes No results for input(s): TROPONINI in the last 168 hours. RADIOLOGY:  CT Angio Chest PE W/Cm &/Or Wo Cm Result Date: 11/08/2024 EXAM: CTA of the Chest with contrast for PE 11/08/2024 06:09:57 PM TECHNIQUE: CTA of the chest was performed after the administration of 60 mL of iohexol  (OMNIPAQUE ) 350 MG/ML injection. Multiplanar reformatted images are provided for review. MIP images are provided for review. Automated exposure control, iterative reconstruction, and/or weight based adjustment of the mA/kV was utilized to reduce the radiation dose to  as low as reasonably achievable. COMPARISON: 11/08/2024 and CTA 08/09/2024. CLINICAL HISTORY: Pulmonary embolism (PE) suspected, high probability. Altered mental status. Labored respirations. FINDINGS: PULMONARY ARTERIES: Pulmonary arteries are adequately opacified for evaluation. No pulmonary embolism. Main pulmonary artery is normal in caliber. MEDIASTINUM: The heart and pericardium demonstrate no acute abnormality. There is no acute abnormality of the thoracic aorta. Small pericardial effusion. LYMPH NODES: No mediastinal, hilar or axillary lymphadenopathy. LUNGS AND PLEURA: Centrilobular emphysema. Bronchial wall thickening with peribronchovascular consolidation and scattered centrilobular micronodules in the lower lobes. Layering debris in the trachea extending into the left mainstem bronchus. Mucous plugging in the lower lobe bronchi. 1.5 cm nodule in the left lower lobe (series 6 image 101). No pleural effusion or pneumothorax. UPPER ABDOMEN: Limited images of the upper abdomen are unremarkable. SOFT TISSUES AND BONES: No acute bone or soft tissue abnormality. IMPRESSION: 1. No pulmonary embolism. 2. Debris in the trachea and left mainstem bronchus suspicious for aspiration. 3. Mucus plugging, and peribronchovascular consolidation in the lower lobes suggesting bronchopneumonia. 4. 1.5 cm left lower lobe solid pulmonary nodule is likely infectious / inflammatory. However, as per Fleischner Society Guidelines, noncontrast follow up CT at 3 months is recommended. 5. Centrilobular emphysema; pulmonary emphysema is an independent risk factor for lung cancer. Recommend consideration for evaluation for a low-dose CT lung cancer screening program. Electronically signed by: Norman Gatlin MD 11/08/2024 06:22 PM EST RP  Workstation: [id number]   CT Head Wo Contrast Result Date: 11/08/2024 CLINICAL DATA:  Altered level of consciousness EXAM: CT HEAD WITHOUT CONTRAST TECHNIQUE: Contiguous axial images were obtained  from the base of the skull through the vertex without intravenous contrast. RADIATION DOSE REDUCTION: This exam was performed according to the departmental dose-optimization program which includes automated exposure control, adjustment of the mA and/or kV according to patient size and/or use of iterative reconstruction technique. COMPARISON:  11/29/2023 FINDINGS: Brain: No acute infarct or hemorrhage. Lateral ventricles and midline structures are unremarkable. No acute extra-axial fluid collections. No mass effect. Vascular: No hyperdense vessel or unexpected calcification. Skull: Normal. Negative for fracture or focal lesion. Sinuses/Orbits: No acute finding. Other: None. IMPRESSION: 1. No acute intracranial process. Electronically Signed   By: Ozell Daring M.D.   On: 11/08/2024 17:22   DG Chest 2 View Result Date: 11/08/2024 EXAM: 2 VIEW(S) XRAY OF THE CHEST 11/08/2024 12:38:00 PM COMPARISON: 10/01/2024 CLINICAL HISTORY: Altered mental status and hypoxia. FINDINGS: LUNGS AND PLEURA: Streaky airspace opacities within lung bases. No pleural effusion. No pneumothorax. HEART AND MEDIASTINUM: Aortic calcification. BONES AND SOFT TISSUES: Cervical spine surgical hardware. IMPRESSION: 1. Streaky airspace opacities within the lung bases, likely atelectasis versus scar. Electronically signed by: Waddell Calk MD 11/08/2024 01:02 PM EST RP Workstation: [id number]    Assessment and Plan  Patient presented to the emergency department after family noted increased lethargy and confusion this morning. Pt is a poor historian and family is no longer at bedside.   Per the ED note, pt had been more confused and lethargic since this morning. There was question regarding if pt had accidentally taken too much of her medication.    Imaging data relevant for visit: - CT chest: debris in trachea and L main bronchus suspicious for aspiration with mucus plugging; consolidation in L lower lobe consistent with bronchopneumonia;  underlying centrilobular emphysema.    Pneumonia Ct chest notes likely PNA w/ possible aspiration.  - ctx and azithromycin  - iv steroids - speech consult d/t possible aspiration.  NPO until speech eval.      Hypokalemia 2.9 w/ hypomagnesemia - received po K and iv mag in ED - receiving 40 meq K per L mIVF currently (d5 1/2ns) - daily bmp and Mg .    Hyperammonemia No history of hepatic disease noted.  Other LFTs normal. Negative for alcohol. Tylenol  level normal.  BUN normal w/ no evidence of GI bleed.  None of the medications in her medication list are associated with elevated ammonia level as a side effect. No evidence of cirrhosis/fibrosis/steatosis on CT abd on 11/2023.   - am ammonia level     AMS (altered mental status) Confused today.  No focal deficits noted.  Last known normal was last night.  Possibly 2/2 pneumonia.  Does not appear to be d/t hypercapnia.  Vbg shows mildly elevated co2 w/ normal pH.  Possibly d/t accidental medication overdose.  Will hold any home medications that could cause altered mental status tomorrow.  Pt does have diagnosis of dementia and after recent hospital f/u w/ pcp a neurology appt was scheduled to address her cognitive concerns.  - treating PNA - holding home meds that could be contributing. Restart as able when condition improves. .  - delirium precautions   Chronic obstructive pulmonary disease, unspecified (HCC) Some low pitched wheezing appreciated on exam.  Treating as copd exacerbation 2/2 PNA.   - ctx and azithromycin  - iv steroids, transition to po as able.  -  albuterol  prn   Rheumatoid arthritis involving multiple sites with positive rheumatoid factor [facility]) Sees rheumatology, most recently 10/15/24.  Takes methotrexate , plaquenil , and b6.               DVT prophylaxis: Lovenox  Code Status: Full Code Family Communication: tracy walton, sister she is aware pf pt refusing treatment and at high risk for impending resp failure and  mech ventilation Level of care: Med-Surg Status is: Inpatient Remains inpatient appropriate because: copd and PNA    TOTAL TIME TAKING CARE OF THIS PATIENT: 35 minutes.  >50% time spent on counselling and coordination of care  Note: This dictation was prepared with Dragon dictation along with smaller phrase technology. Any transcriptional errors that result from this process are unintentional.  Leita Blanch M.D    [facility]   CC: Primary care physician; Marikay Eva POUR, PA

## 2024-11-09 NOTE — ED Notes (Signed)
 Pt refusing to wear her oxygen. Pt is very tearful and anxious. RN notified provider. Per MD, pt sister is on her way here. Pt is A/Ox1

## 2024-11-09 NOTE — ED Notes (Signed)
 Fall bundle in place, bed alarm activated

## 2024-11-09 NOTE — Assessment & Plan Note (Signed)
 Sees rheumatology, most recently 10/15/24.  Takes methotrexate , plaquenil , and b6.

## 2024-11-10 DIAGNOSIS — R4182 Altered mental status, unspecified: Secondary | ICD-10-CM | POA: Diagnosis not present

## 2024-11-10 DIAGNOSIS — J441 Chronic obstructive pulmonary disease with (acute) exacerbation: Secondary | ICD-10-CM | POA: Diagnosis not present

## 2024-11-10 DIAGNOSIS — E876 Hypokalemia: Secondary | ICD-10-CM | POA: Diagnosis not present

## 2024-11-10 DIAGNOSIS — J189 Pneumonia, unspecified organism: Secondary | ICD-10-CM | POA: Diagnosis not present

## 2024-11-10 LAB — BASIC METABOLIC PANEL WITH GFR
Anion gap: 11 (ref 5–15)
BUN: 18 mg/dL (ref 8–23)
CO2: 31 mmol/L (ref 22–32)
Calcium: 9.1 mg/dL (ref 8.9–10.3)
Chloride: 100 mmol/L (ref 98–111)
Creatinine, Ser: 0.77 mg/dL (ref 0.44–1.00)
GFR, Estimated: >60 mL/min
Glucose, Bld: 148 mg/dL — ABNORMAL HIGH (ref 70–99)
Potassium: 3.1 mmol/L — ABNORMAL LOW (ref 3.5–5.1)
Sodium: 143 mmol/L (ref 135–145)

## 2024-11-10 LAB — PROTIME-INR
INR: 1.2 (ref 0.8–1.2)
Prothrombin Time: 15.9 s — ABNORMAL HIGH (ref 11.4–15.2)

## 2024-11-10 LAB — AMMONIA: Ammonia: 46 umol/L — ABNORMAL HIGH (ref 9–35)

## 2024-11-10 LAB — HIV ANTIBODY (ROUTINE TESTING W REFLEX): HIV Screen 4th Generation wRfx: NONREACTIVE

## 2024-11-10 MED ORDER — LORAZEPAM 2 MG/ML IJ SOLN
1.0000 mg | Freq: Once | INTRAMUSCULAR | Status: AC
  Administered 2024-11-10: 1 mg via INTRAVENOUS
  Filled 2024-11-10: qty 1

## 2024-11-10 MED ORDER — TORSEMIDE 20 MG PO TABS
20.0000 mg | ORAL_TABLET | ORAL | Status: DC
  Administered 2024-11-10 – 2024-11-12 (×2): 20 mg via ORAL
  Filled 2024-11-10 (×2): qty 1

## 2024-11-10 MED ORDER — HYDROXYCHLOROQUINE SULFATE 200 MG PO TABS
200.0000 mg | ORAL_TABLET | Freq: Every day | ORAL | Status: DC
  Administered 2024-11-10 – 2024-11-12 (×3): 200 mg via ORAL
  Filled 2024-11-10 (×3): qty 1

## 2024-11-10 MED ORDER — BUSPIRONE HCL 10 MG PO TABS
30.0000 mg | ORAL_TABLET | Freq: Every day | ORAL | Status: DC
  Administered 2024-11-10 – 2024-11-11 (×2): 30 mg via ORAL
  Filled 2024-11-10 (×2): qty 3

## 2024-11-10 MED ORDER — OXYBUTYNIN CHLORIDE ER 5 MG PO TB24
10.0000 mg | ORAL_TABLET | Freq: Every day | ORAL | Status: DC
  Administered 2024-11-10 – 2024-11-11 (×2): 10 mg via ORAL
  Filled 2024-11-10 (×2): qty 2

## 2024-11-10 MED ORDER — ASPIRIN 81 MG PO TBEC
81.0000 mg | DELAYED_RELEASE_TABLET | Freq: Every day | ORAL | Status: DC
  Administered 2024-11-10 – 2024-11-12 (×3): 81 mg via ORAL
  Filled 2024-11-10 (×3): qty 1

## 2024-11-10 MED ORDER — GUAIFENESIN ER 600 MG PO TB12
600.0000 mg | ORAL_TABLET | Freq: Two times a day (BID) | ORAL | Status: DC
  Administered 2024-11-10 – 2024-11-12 (×5): 600 mg via ORAL
  Filled 2024-11-10 (×5): qty 1

## 2024-11-10 MED ORDER — DICLOFENAC SODIUM 1 % EX GEL
2.0000 g | Freq: Four times a day (QID) | CUTANEOUS | Status: DC
  Administered 2024-11-11 – 2024-11-12 (×3): 2 g via TOPICAL
  Filled 2024-11-10: qty 100

## 2024-11-10 MED ORDER — METOPROLOL TARTRATE 25 MG PO TABS
25.0000 mg | ORAL_TABLET | Freq: Two times a day (BID) | ORAL | Status: DC
  Administered 2024-11-10 – 2024-11-12 (×5): 25 mg via ORAL
  Filled 2024-11-10 (×5): qty 1

## 2024-11-10 MED ORDER — QUETIAPINE FUMARATE 25 MG PO TABS
50.0000 mg | ORAL_TABLET | Freq: Two times a day (BID) | ORAL | Status: DC
  Administered 2024-11-10 – 2024-11-12 (×5): 50 mg via ORAL
  Filled 2024-11-10 (×5): qty 2

## 2024-11-10 MED ORDER — FOLIC ACID 1 MG PO TABS
1.0000 mg | ORAL_TABLET | Freq: Every day | ORAL | Status: DC
  Administered 2024-11-10 – 2024-11-12 (×3): 1 mg via ORAL
  Filled 2024-11-10 (×3): qty 1

## 2024-11-10 MED ORDER — ALBUTEROL SULFATE (2.5 MG/3ML) 0.083% IN NEBU
2.5000 mg | INHALATION_SOLUTION | Freq: Four times a day (QID) | RESPIRATORY_TRACT | Status: DC | PRN
  Administered 2024-11-11 (×2): 2.5 mg via RESPIRATORY_TRACT
  Filled 2024-11-10 (×3): qty 3

## 2024-11-10 MED ORDER — BUSPIRONE HCL 10 MG PO TABS
15.0000 mg | ORAL_TABLET | Freq: Two times a day (BID) | ORAL | Status: DC
  Administered 2024-11-11 – 2024-11-12 (×3): 15 mg via ORAL
  Filled 2024-11-10 (×3): qty 2

## 2024-11-10 MED ORDER — DULOXETINE HCL 30 MG PO CPEP
60.0000 mg | ORAL_CAPSULE | Freq: Every day | ORAL | Status: DC
  Administered 2024-11-10 – 2024-11-12 (×3): 60 mg via ORAL
  Filled 2024-11-10: qty 2
  Filled 2024-11-10: qty 1
  Filled 2024-11-10: qty 2

## 2024-11-10 MED ORDER — DILTIAZEM HCL ER COATED BEADS 240 MG PO CP24
240.0000 mg | ORAL_CAPSULE | Freq: Every day | ORAL | Status: DC
  Administered 2024-11-10 – 2024-11-12 (×3): 240 mg via ORAL
  Filled 2024-11-10 (×3): qty 1

## 2024-11-10 NOTE — Progress Notes (Signed)
 SLP Cancellation Note  Patient Details Name: Teresa Lang MRN: [id number] DOB: 14-Dec-1955   Cancelled treatment:       Reason Eval/Treat Not Completed: Patient not medically ready;Fatigue/lethargy limiting ability to participate   [name] 11/10/2024, 11:14 AM

## 2024-11-10 NOTE — ED Notes (Addendum)
 Pt ambulated to walker from hospital bed with standby assist of this RN. Pt ambulated with assistance of walker to restroom. A new brief applied.   Pt ambulated with assistance of walker back to room.   Pt back in hospital bed with VS monitoring equipment back on. Bed in lowest, locked position with call bell in reach.

## 2024-11-11 DIAGNOSIS — J441 Chronic obstructive pulmonary disease with (acute) exacerbation: Secondary | ICD-10-CM | POA: Diagnosis not present

## 2024-11-11 DIAGNOSIS — J189 Pneumonia, unspecified organism: Secondary | ICD-10-CM | POA: Diagnosis not present

## 2024-11-11 DIAGNOSIS — E876 Hypokalemia: Secondary | ICD-10-CM | POA: Diagnosis not present

## 2024-11-11 DIAGNOSIS — R4182 Altered mental status, unspecified: Secondary | ICD-10-CM | POA: Diagnosis not present

## 2024-11-11 LAB — PROTIME-INR
INR: 1.1 (ref 0.8–1.2)
Prothrombin Time: 15.1 s (ref 11.4–15.2)

## 2024-11-11 MED ORDER — CALCIUM CARBONATE ANTACID 500 MG PO CHEW
1.0000 | CHEWABLE_TABLET | Freq: Two times a day (BID) | ORAL | Status: DC | PRN
  Filled 2024-11-11 (×2): qty 1

## 2024-11-11 MED ORDER — BUDESON-GLYCOPYRROL-FORMOTEROL 160-9-4.8 MCG/ACT IN AERO
2.0000 | INHALATION_SPRAY | Freq: Two times a day (BID) | RESPIRATORY_TRACT | Status: DC
  Administered 2024-11-11 – 2024-11-12 (×3): 2 via RESPIRATORY_TRACT
  Filled 2024-11-11 (×2): qty 5.9

## 2024-11-11 MED ORDER — PREDNISONE 20 MG PO TABS
20.0000 mg | ORAL_TABLET | Freq: Every day | ORAL | Status: DC
  Administered 2024-11-12: 20 mg via ORAL
  Filled 2024-11-11: qty 1

## 2024-11-11 NOTE — Plan of Care (Signed)

## 2024-11-11 NOTE — Evaluation (Signed)
 Occupational Therapy Evaluation Patient Details Name: Teresa Lang MRN: [id number] DOB: 09-Sep-1956 Today's Date: 11/11/2024   History of Present Illness   69 y/o female with accidental medication overdose and PNA.     Clinical Impressions Patient presenting with decreased Ind in self care,balance, functional mobility, transfers, endurance, and safety awareness. Patient reports living at home with sister and being Ind with self care tasks. She endorses furniture walking but recently using a rollator for mobility as needed. Pt is tearful when discussing home situation. She states,  I am unsure if I can go back to my sister's house. Patient currently functioning at supervision for bed mobility and stands with use of RW and min guard. Pt taking several steps to recliner chair with min guard as breakfast tray has arrived. Pt seated in recliner chair with tray set up for meal. Pt currently on 4Ls O2 via [place] and endorses being on 3Ls at baseline.  Patient will benefit from acute OT to increase overall independence in the areas of ADLs, functional mobility, and safety awareness in order to safely discharge.     If plan is discharge home, recommend the following:   A little help with walking and/or transfers;A little help with bathing/dressing/bathroom;Assistance with cooking/housework;Assist for transportation;Help with stairs or ramp for entrance     Functional Status Assessment   Patient has had a recent decline in their functional status and demonstrates the ability to make significant improvements in function in a reasonable and predictable amount of time.     Equipment Recommendations   BSC/3in1;Tub/shower seat      Precautions/Restrictions   Precautions Precautions: Fall     Mobility Bed Mobility Overal bed mobility: Needs Assistance Bed Mobility: Supine to Sit, Sit to Supine     Supine to sit: Supervision Sit to supine: Supervision   General bed mobility comments:  no physical assistance    Transfers Overall transfer level: Needs assistance Equipment used: Rolling walker (2 wheels) Transfers: Sit to/from Stand, Bed to chair/wheelchair/BSC Sit to Stand: Contact guard assist     Step pivot transfers: Contact guard assist            Balance Overall balance assessment: Needs assistance Sitting-balance support: Feet supported Sitting balance-Leahy Scale: Good     Standing balance support: Reliant on assistive device for balance, Bilateral upper extremity supported Standing balance-Leahy Scale: Fair                             ADL either performed or assessed with clinical judgement   ADL Overall ADL's : Needs assistance/impaired     Grooming: Wash/dry hands;Wash/dry face;Sitting;Supervision/safety                   Toilet Transfer: Contact guard assist;Rolling walker (2 wheels)                   Vision Baseline Vision/History: 1 Wears glasses Patient Visual Report: No change from baseline              Pertinent Vitals/Pain Pain Assessment Pain Assessment: No/denies pain     Extremity/Trunk Assessment Upper Extremity Assessment Upper Extremity Assessment: Generalized weakness   Lower Extremity Assessment Lower Extremity Assessment: Generalized weakness       Communication Communication Communication: No apparent difficulties   Cognition Arousal: Alert Behavior During Therapy: WFL for tasks assessed/performed Cognition: No family/caregiver present to determine baseline, History of cognitive impairments  OT - Cognition Comments: hx of dementia                 Following commands: Intact       Cueing  General Comments   Cueing Techniques: Verbal cues              Home Living Family/patient expects to be discharged to:: Private residence Living Arrangements: Other relatives (sister) Available Help at Discharge: Family Type of Home: House Home Access:  Ramped entrance     Home Layout: One level     Bathroom Shower/Tub: Tub/shower unit         Home Equipment: Rollator (4 wheels)   Additional Comments: Pt lives with sister but unsure if she can return      Prior Functioning/Environment Prior Level of Function : Independent/Modified Independent             Mobility Comments: furniture walks and sometimes using rollator ADLs Comments: independent in self care per her report    OT Problem List: Decreased strength;Decreased range of motion;Decreased safety awareness;Decreased activity tolerance;Impaired balance (sitting and/or standing);Cardiopulmonary status limiting activity   OT Treatment/Interventions: Self-care/ADL training;Therapeutic activities;Therapeutic exercise;Energy conservation;Patient/family education;Balance training      OT Goals(Current goals can be found in the care plan section)   Acute Rehab OT Goals Patient Stated Goal: to go home OT Goal Formulation: With patient Time For Goal Achievement: 11/25/24 Potential to Achieve Goals: Fair ADL Goals Pt Will Perform Grooming: with modified independence;standing Pt Will Perform Lower Body Dressing: with modified independence;sit to/from stand Pt Will Transfer to Toilet: with modified independence;ambulating Pt Will Perform Toileting - Clothing Manipulation and hygiene: with modified independence;sit to/from stand   OT Frequency:  Min 2X/week       AM-PAC OT 6 Clicks Daily Activity     Outcome Measure Help from another person eating meals?: None Help from another person taking care of personal grooming?: None Help from another person toileting, which includes using toliet, bedpan, or urinal?: A Little Help from another person bathing (including washing, rinsing, drying)?: A Little Help from another person to put on and taking off regular upper body clothing?: None Help from another person to put on and taking off regular lower body clothing?: A  Little 6 Click Score: 21   End of Session Equipment Utilized During Treatment: Rolling walker (2 wheels) Nurse Communication: Mobility status  Activity Tolerance: Patient tolerated treatment well Patient left: in chair;with call bell/phone within reach;with chair alarm set  OT Visit Diagnosis: Unsteadiness on feet (R26.81);Repeated falls (R29.6);Muscle weakness (generalized) (M62.81)                Time: 9094-9079 OT Time Calculation (min): 15 min Charges:  OT General Charges $OT Visit: 1 Visit OT Evaluation $OT Eval Low Complexity: 1 Low OT Treatments $Self Care/Home Management : 8-22 mins  Izetta Claude, MS, OTR/L , CBIS ascom [phone]  11/11/24, 9:49 AM

## 2024-11-11 NOTE — Care Management Important Message (Signed)
 Important Message  Patient Details  Name: Teresa Lang MRN: [id number] Date of Birth: 10-Sep-1956   Important Message Given:  Yes - Medicare IM     Jillann Charette 11/11/2024, 1:33 PM

## 2024-11-11 NOTE — Plan of Care (Signed)
" °  Problem: Clinical Measurements: Goal: Respiratory complications will improve Outcome: Progressing   Problem: Activity: Goal: Risk for activity intolerance will decrease Outcome: Progressing   Problem: Nutrition: Goal: Adequate nutrition will be maintained Outcome: Progressing   Problem: Coping: Goal: Level of anxiety will decrease Outcome: Progressing   Problem: Elimination: Goal: Will not experience complications related to bowel motility Outcome: Progressing Goal: Will not experience complications related to urinary retention Outcome: Progressing   Problem: Pain Managment: Goal: General experience of comfort will improve and/or be controlled Outcome: Progressing   "

## 2024-11-11 NOTE — Progress Notes (Signed)
 SPIRITUAL CARE AND COUNSELING CONSULT NOTE   VISIT SUMMARY Chaplain On-Call responded to Spiritual Care Consult Order from Leita Blanch, MD, who noted that the patient requested prayer. Chaplain provided prayer and spiritual and emotional support for patient and daughter Randine.   SPIRITUAL ENCOUNTER                                                                                                                                                                      Type of Visit: Initial Care provided to:: Pt and family (daughter Randine Penner) Referral source: Physician (Spiritual Care Consult Order from Leita Blanch, MD) Reason for visit: Routine spiritual support OnCall Visit: Yes   SPIRITUAL FRAMEWORK  Presenting Themes: Significant life change Patient Stress Factors: Health changes Family Stress Factors: Major life changes   GOALS       INTERVENTIONS   Spiritual Care Interventions Made: Prayer, Compassionate presence    INTERVENTION OUTCOMES      SPIRITUAL CARE PLAN        If immediate needs arise, please contact [facility] 24 hour on call [phone]   Carlin KATHEE Lay, Chaplain  11/11/2024 12:06 PM

## 2024-11-12 LAB — PROTIME-INR
INR: 1.1 (ref 0.8–1.2)
Prothrombin Time: 14.7 s (ref 11.4–15.2)

## 2024-11-12 MED ORDER — AMOXICILLIN-POT CLAVULANATE 875-125 MG PO TABS: 1.0000 | ORAL_TABLET | Freq: Two times a day (BID) | ORAL | 0 refills | Status: DC

## 2024-11-12 MED ORDER — PREDNISONE 20 MG PO TABS: 20.0000 mg | ORAL_TABLET | Freq: Every day | ORAL | 0 refills | Status: DC

## 2024-11-12 MED ORDER — AMOXICILLIN-POT CLAVULANATE 875-125 MG PO TABS: 1.0000 | ORAL_TABLET | Freq: Two times a day (BID) | ORAL | 0 refills | Status: AC

## 2024-11-12 MED ORDER — AMOXICILLIN-POT CLAVULANATE 875-125 MG PO TABS
1.0000 | ORAL_TABLET | Freq: Two times a day (BID) | ORAL | Status: DC
  Administered 2024-11-12: 1 via ORAL
  Filled 2024-11-12: qty 1

## 2024-11-12 MED ORDER — PREDNISONE 20 MG PO TABS: 20.0000 mg | ORAL_TABLET | Freq: Every day | ORAL | 0 refills | Status: AC

## 2024-11-12 MED ORDER — AMOXICILLIN-POT CLAVULANATE 875-125 MG PO TABS: 1.0000 | ORAL_TABLET | Freq: Two times a day (BID) | ORAL | Status: DC

## 2024-11-12 NOTE — Discharge Instructions (Signed)
 Use your oxygen, inhaler, nebulizers as before

## 2024-11-12 NOTE — Discharge Summary (Signed)
 " Physician Discharge Summary   Patient: Teresa Lang MRN: [id number] DOB: 02/24/1956  Admit date:     11/08/2024  Discharge date: 11/12/24  Discharge Physician: Leita Blanch   PCP: Marikay Eva POUR, PA   Recommendations at discharge:   use oxygen nebulizer has been follow-up with PCP in 1 to 2 week follow-up [facility] pulmonary on your upcoming  Discharge Diagnoses: Principal Problem:   Pneumonia Active Problems:   Rheumatoid arthritis involving multiple sites with positive rheumatoid factor (HCC)   COPD exacerbation (HCC)   AMS (altered mental status)   Hyperammonemia   Hypokalemia  Patient presented to the emergency department after family noted increased lethargy and confusion this morning. Pt is a poor historian and family is no longer at bedside.   Per the ED note, pt had been more confused and lethargic since this morning. There was question regarding if pt had accidentally taken too much of her medication.    Imaging data relevant for visit: - CT chest: debris in trachea and L main bronchus suspicious for aspiration with mucus plugging; consolidation in L lower lobe consistent with bronchopneumonia; underlying centrilobular emphysema.    Pneumonia CT chest  likely PNA w/ possible aspiration.  - ctx and azithromycin --change to po augmentin  - iv steroids--change to po - speech consult d/t possible aspiration--tolerating current diet well   -- Speech eval noted. Diet is advanced. Patient tolerating it well.   Hypokalemia --replace as needed   Hyperammonemia No history of hepatic disease noted.  Other LFTs normal. Negative for alcohol. Tylenol  level normal.  BUN normal w/ no evidence of GI bleed.  None of the medications in her medication list are associated with elevated ammonia level as a side effect. No evidence of cirrhosis/fibrosis/steatosis on CT abd on 11/2023.   -Ammonia level down to 46   AMS (altered mental status) Confused today.  No focal deficits noted.  Last  known normal was last night.  Possibly 2/2 pneumonia.  Does not appear to be d/t hypercapnia.  - treating PNA -mentation back to baseline   Chronic obstructive pulmonary disease, unspecified (HCC) Some low pitched wheezing appreciated on exam.  Treating as copd exacerbation 2/2 PNA.   - ctx and azithromycin  - iv steroids, transition to po at d/c - albuterol  prn   Rheumatoid arthritis involving multiple sites with positive rheumatoid factor [facility]) Sees rheumatology, most recently 10/15/24.  Takes methotrexate , plaquenil , and b6.     Mixed dementia Alzheimer and vascular component   -- patient follows with [facility] neurology   Overall improving.d/c home today with HH   DVT prophylaxis: Lovenox  Code Status: Full Code Family Communication: tracy walton, sister Level of care: Med-Surg     Pain control - Long  Controlled Substance Reporting System database was reviewed. and patient was instructed, not to drive, operate heavy machinery, perform activities at heights, swimming or participation in water activities or provide baby-sitting services while on Pain, Sleep and Anxiety Medications; until their outpatient Physician has advised to do so again. Also recommended to not to take more than prescribed Pain, Sleep and Anxiety Medications.  Disposition: Home health Diet recommendation:  Cardiac diet DISCHARGE MEDICATION: Allergies as of 11/12/2024       Reactions   Prednisone  Anxiety        Medication List     STOP taking these medications    chlorpheniramine-HYDROcodone  10-8 MG/5ML Commonly known as: TUSSIONEX   furosemide 20 MG tablet Commonly known as: LASIX   sulfamethoxazole-trimethoprim 400-80 MG tablet Commonly  known as: BACTRIM       TAKE these medications    Acetylcysteine 600 MG Caps Take 1,200 mg by mouth daily.   albuterol  108 (90 Base) MCG/ACT inhaler Commonly known as: VENTOLIN  HFA Inhale 2 puffs into the lungs every 6 (six) hours as needed for  wheezing or shortness of breath.   alendronate 70 MG tablet Commonly known as: FOSAMAX Take 70 mg by mouth once a week.   amoxicillin -clavulanate 875-125 MG tablet Commonly known as: AUGMENTIN  Take 1 tablet by mouth every 12 (twelve) hours for 4 days.   aspirin  EC 81 MG tablet Take 81 mg by mouth.   atorvastatin  40 MG tablet Commonly known as: LIPITOR Take 40 mg by mouth daily.   Baclofen 5 MG Tabs Take 1 tablet by mouth 3 (three) times daily.   budesonide -glycopyrrolate -formoterol  160-9-4.8 MCG/ACT Aero inhaler Commonly known as: BREZTRI  Inhale 2 puffs into the lungs 2 (two) times daily.   busPIRone  15 MG tablet Commonly known as: BUSPAR  Take 15 mg by mouth 3 (three) times daily. Take 1 tablet in the morning, 1 tablet in the  afternoon and 2 tablets at bedtime.   diclofenac  Sodium 1 % Gel Commonly known as: VOLTAREN  APPLY 2 GRAMS TOPICALLY TO THE AFFECTED AREA FOUR TIMES DAILY.   diltiazem  240 MG 24 hr capsule Commonly known as: CARDIZEM  CD Take 240 mg by mouth daily.   DULoxetine  60 MG capsule Commonly known as: CYMBALTA  Take 60 mg by mouth daily.   EPINEPHrine  0.3 mg/0.3 mL Soaj injection Commonly known as: EPI-PEN Inject 0.3 mg into the muscle as needed for anaphylaxis.   folic acid  1 MG tablet Commonly known as: FOLVITE  Take 1 tablet (1 mg total) by mouth daily.   guaiFENesin  600 MG 12 hr tablet Commonly known as: MUCINEX  Take 600 mg by mouth.   hydroxychloroquine  200 MG tablet Commonly known as: PLAQUENIL  Take 200 mg by mouth daily.   ipratropium-albuterol  0.5-2.5 (3) MG/3ML Soln Commonly known as: DUONEB Take 3 mLs by nebulization every 6 (six) hours as needed.   Mepolizumab 100 MG/ML Soaj Inject 100 mg into the skin as directed. Inject 1 mL (100 mg total) subcutaneously every 28 (twenty-eight) days Allow to sit at room temperature for 30 minutes prior to administration. Administer via SUBQ injection into the upper arm, thigh, or abdomen (avoid 5 cm  [~2 inches] area surrounding the navel); avoid skin that is tender, bruised, red, or hard. ** Do not freeze; protect from light and heat. Do not shake. **   methotrexate  2.5 MG tablet Commonly known as: RHEUMATREX Take 20 mg by mouth once a week.   metoprolol  tartrate 25 MG tablet Commonly known as: LOPRESSOR  Take 1 tablet (25 mg total) by mouth 2 (two) times daily.   omeprazole 40 MG capsule Commonly known as: PRILOSEC Take 40 mg by mouth daily as needed (acid reflux symptoms).   oxybutynin  10 MG 24 hr tablet Commonly known as: DITROPAN -XL Take 1 tablet (10 mg total) by mouth at bedtime.   OXYGEN Inhale into the lungs. PRN   predniSONE  20 MG tablet Commonly known as: DELTASONE  Take 1 tablet (20 mg total) by mouth daily with breakfast for 4 days. What changed:  medication strength See the new instructions.   QUEtiapine  50 MG tablet Commonly known as: SEROQUEL  Take 50 mg by mouth 2 (two) times daily.   solifenacin  5 MG tablet Commonly known as: VESICARE  Take 1 tablet (5 mg total) by mouth daily.   torsemide  20 MG tablet  Commonly known as: DEMADEX  Take 20 mg by mouth every Monday, Wednesday, and Friday.   traZODone  100 MG tablet Commonly known as: DESYREL  Take 100 mg by mouth at bedtime.        Contact information for follow-up providers     Marikay Eva POUR, PA. Schedule an appointment as soon as possible for a visit in 1 week(s).   Specialty: Physician Assistant Contact information: [phone] [facility] [address] [facility] [place] [address] [phone]         Parris Manna, MD. Schedule an appointment as soon as possible for a visit in 1 week(s).   Specialty: Pulmonary Disease Why: COPD, PNA Contact information: [address] [place] [address] [phone]              Contact information for after-discharge care     Home Medical Care     [facility]) .   Service: Home Health Services                     Discharge Exam: Filed Weights   11/08/24 1205 11/10/24 2205  Weight: 90.7 kg 90.3 kg   GENERAL:  69 y.o.-year-old patient with acute distress.  LUNGS: coarse breath sounds bilaterally, no wheezing CARDIOVASCULAR: S1, S2 normal. No murmur   EXTREMITIES: No  edema b/l.    NEUROLOGIC awake and alert grossly nonfocal.  Condition at discharge: fair  The results of significant diagnostics from this hospitalization (including imaging, microbiology, ancillary and laboratory) are listed below for reference.   Imaging Studies: CT Angio Chest PE W/Cm &/Or Wo Cm Result Date: 11/08/2024 EXAM: CTA of the Chest with contrast for PE 11/08/2024 06:09:57 PM TECHNIQUE: CTA of the chest was performed after the administration of 60 mL of iohexol  (OMNIPAQUE ) 350 MG/ML injection. Multiplanar reformatted images are provided for review. MIP images are provided for review. Automated exposure control, iterative reconstruction, and/or weight based adjustment of the mA/kV was utilized to reduce the radiation dose to as low as reasonably achievable. COMPARISON: 11/08/2024 and CTA 08/09/2024. CLINICAL HISTORY: Pulmonary embolism (PE) suspected, high probability. Altered mental status. Labored respirations. FINDINGS: PULMONARY ARTERIES: Pulmonary arteries are adequately opacified for evaluation. No pulmonary embolism. Main pulmonary artery is normal in caliber. MEDIASTINUM: The heart and pericardium demonstrate no acute abnormality. There is no acute abnormality of the thoracic aorta. Small pericardial effusion. LYMPH NODES: No mediastinal, hilar or axillary lymphadenopathy. LUNGS AND PLEURA: Centrilobular emphysema. Bronchial wall thickening with peribronchovascular consolidation and scattered centrilobular micronodules in the lower lobes. Layering debris in the trachea extending into the left mainstem bronchus. Mucous plugging in the lower lobe bronchi. 1.5 cm nodule in the left lower lobe (series 6  image 101). No pleural effusion or pneumothorax. UPPER ABDOMEN: Limited images of the upper abdomen are unremarkable. SOFT TISSUES AND BONES: No acute bone or soft tissue abnormality. IMPRESSION: 1. No pulmonary embolism. 2. Debris in the trachea and left mainstem bronchus suspicious for aspiration. 3. Mucus plugging, and peribronchovascular consolidation in the lower lobes suggesting bronchopneumonia. 4. 1.5 cm left lower lobe solid pulmonary nodule is likely infectious / inflammatory. However, as per Fleischner Society Guidelines, noncontrast follow up CT at 3 months is recommended. 5. Centrilobular emphysema; pulmonary emphysema is an independent risk factor for lung cancer. Recommend consideration for evaluation for a low-dose CT lung cancer screening program. Electronically signed by: Norman Gatlin MD 11/08/2024 06:22 PM EST RP Workstation: [id number]   CT Head Wo Contrast Result Date: 11/08/2024 CLINICAL  DATA:  Altered level of consciousness EXAM: CT HEAD WITHOUT CONTRAST TECHNIQUE: Contiguous axial images were obtained from the base of the skull through the vertex without intravenous contrast. RADIATION DOSE REDUCTION: This exam was performed according to the departmental dose-optimization program which includes automated exposure control, adjustment of the mA and/or kV according to patient size and/or use of iterative reconstruction technique. COMPARISON:  11/29/2023 FINDINGS: Brain: No acute infarct or hemorrhage. Lateral ventricles and midline structures are unremarkable. No acute extra-axial fluid collections. No mass effect. Vascular: No hyperdense vessel or unexpected calcification. Skull: Normal. Negative for fracture or focal lesion. Sinuses/Orbits: No acute finding. Other: None. IMPRESSION: 1. No acute intracranial process. Electronically Signed   By: Ozell Daring M.D.   On: 11/08/2024 17:22   DG Chest 2 View Result Date: 11/08/2024 EXAM: 2 VIEW(S) XRAY OF THE CHEST 11/08/2024 12:38:00 PM  COMPARISON: 10/01/2024 CLINICAL HISTORY: Altered mental status and hypoxia. FINDINGS: LUNGS AND PLEURA: Streaky airspace opacities within lung bases. No pleural effusion. No pneumothorax. HEART AND MEDIASTINUM: Aortic calcification. BONES AND SOFT TISSUES: Cervical spine surgical hardware. IMPRESSION: 1. Streaky airspace opacities within the lung bases, likely atelectasis versus scar. Electronically signed by: Waddell Calk MD 11/08/2024 01:02 PM EST RP Workstation: [id number]   DG Epidural/Nerve Root Result Date: 11/06/2024 CLINICAL DATA:  69 year old female with lumbar degenerative disc disease and spondylosis with diffuse disc bulging and bilateral facet hypertrophy resulting in central spinal stenosis and mild to moderate right lateral recess and bilateral neural foraminal stenosis at L4-L5. She has chronic low back pain with bilateral lower extremity radiculopathy. Her pain today is a 7/10 on a 10 point scale. She has failed conservative management and therefore presents for bilateral L4-L5 nerve root block with transforaminal epidural steroid injection. 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 Questions regarding the procedure were encouraged and answered. The patient understands and consents to the procedure. LEFT L4-L5 NERVE ROOT BLOCK AND TRANSFORAMINAL EPIDURAL: A posterior oblique approach was taken to the intervertebral foramen on the left at L4-L5 using a 5 inch curved 22 gauge spinal needle. Injection of Omnipaque  180 outlined the L4 nerve root and showed good epidural spread. No vascular opacification is seen. 40 mg of Depo-Medrol  mixed with 1 mL 1% lidocaine  were instilled. The procedure was well-tolerated, and the patient was discharged thirty minutes following the injection in good  condition. RIGHT L4-L5 NERVE ROOT BLOCK AND TRANSFORAMINAL EPIDURAL: A posterior oblique approach was taken to the intervertebral foramen on the right at L4-L5 using a 5 inch curved 22 gauge spinal needle. Injection of Omnipaque  180 outlined the L4 nerve root and showed good epidural spread. No vascular opacification is seen. 40 mg of Depo-Medrol  mixed with 1 mL 1% lidocaine  were instilled. The procedure was well-tolerated, and the patient was discharged thirty minutes following the injection in good condition. COMPLICATIONS: None IMPRESSION: Technically successful injection consisting of a left L4-L5 nerve root block and transforaminal epidural. Technically successful injection consisting of a right L4-L5 nerve root block and transforaminal epidural. Electronically Signed   By: Wilkie Lent M.D.   On: 11/06/2024 09:46   DG Epidural/Nerve Root Result Date: 11/06/2024 CLINICAL DATA:  69 year old female with lumbar degenerative disc disease and spondylosis with diffuse disc bulging and bilateral facet hypertrophy resulting in central spinal stenosis and mild to moderate right lateral recess and bilateral neural foraminal  stenosis at L4-L5. She has chronic low back pain with bilateral lower extremity radiculopathy. Her pain today is a 7/10 on a 10 point scale. She has failed conservative management and therefore presents for bilateral L4-L5 nerve root block with transforaminal epidural steroid injection. 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 Questions regarding the procedure were encouraged and answered. The patient understands and consents to the procedure. LEFT L4-L5 NERVE ROOT BLOCK AND TRANSFORAMINAL EPIDURAL: A posterior oblique approach was taken to the intervertebral foramen on the left at  L4-L5 using a 5 inch curved 22 gauge spinal needle. Injection of Omnipaque  180 outlined the L4 nerve root and showed good epidural spread. No vascular opacification is seen. 40 mg of Depo-Medrol  mixed with 1 mL 1% lidocaine  were instilled. The procedure was well-tolerated, and the patient was discharged thirty minutes following the injection in good condition. RIGHT L4-L5 NERVE ROOT BLOCK AND TRANSFORAMINAL EPIDURAL: A posterior oblique approach was taken to the intervertebral foramen on the right at L4-L5 using a 5 inch curved 22 gauge spinal needle. Injection of Omnipaque  180 outlined the L4 nerve root and showed good epidural spread. No vascular opacification is seen. 40 mg of Depo-Medrol  mixed with 1 mL 1% lidocaine  were instilled. The procedure was well-tolerated, and the patient was discharged thirty minutes following the injection in good condition. COMPLICATIONS: None IMPRESSION: Technically successful injection consisting of a left L4-L5 nerve root block and transforaminal epidural. Technically successful injection consisting of a right L4-L5 nerve root block and transforaminal epidural. Electronically Signed   By: Wilkie Lent M.D.   On: 11/06/2024 09:46    Microbiology: Results for orders placed or performed during the hospital encounter of 10/01/24  Resp panel by RT-PCR (RSV, Flu A&B, Covid) Anterior Nasal Swab     Status: None   Collection Time: 10/01/24  7:08 PM   Specimen: Anterior Nasal Swab  Result Value Ref Range Status   SARS Coronavirus 2 by RT PCR NEGATIVE NEGATIVE Final    Comment: (NOTE) SARS-CoV-2 target nucleic acids are NOT DETECTED.  The SARS-CoV-2 RNA is generally detectable in upper respiratory specimens during the acute phase of infection. The lowest concentration of SARS-CoV-2 viral copies this assay can detect is 138 copies/mL. A negative result does not preclude SARS-Cov-2 infection and should not be used as the sole basis for treatment or other patient  management decisions. A negative result may occur with  improper specimen collection/handling, submission of specimen other than nasopharyngeal swab, presence of viral mutation(s) within the areas targeted by this assay, and inadequate number of viral copies(<138 copies/mL). A negative result must be combined with clinical observations, patient history, and epidemiological information. The expected result is Negative.  Fact Sheet for Patients:  bloggercourse.com  Fact Sheet for Healthcare Providers:  seriousbroker.it  This test is no t yet approved or cleared by the [place]  FDA and  has been authorized for detection and/or diagnosis of SARS-CoV-2 by FDA under an Emergency Use Authorization (EUA). This EUA will remain  in effect (meaning this test can be used) for the duration of the COVID-19 declaration under Section 564(b)(1) of the Act, 21 U.S.C.section 360bbb-3(b)(1), unless the authorization is terminated  or revoked sooner.       Influenza A by PCR NEGATIVE NEGATIVE Final   Influenza B by PCR NEGATIVE NEGATIVE Final    Comment: (  NOTE) The Xpert Xpress SARS-CoV-2/FLU/RSV plus assay is intended as an aid in the diagnosis of influenza from Nasopharyngeal swab specimens and should not be used as a sole basis for treatment. Nasal washings and aspirates are unacceptable for Xpert Xpress SARS-CoV-2/FLU/RSV testing.  Fact Sheet for Patients: bloggercourse.com  Fact Sheet for Healthcare Providers: seriousbroker.it  This test is not yet approved or cleared by the [place]  FDA and has been authorized for detection and/or diagnosis of SARS-CoV-2 by FDA under an Emergency Use Authorization (EUA). This EUA will remain in effect (meaning this test can be used) for the duration of the COVID-19 declaration under Section 564(b)(1) of the Act, 21 U.S.C. section 360bbb-3(b)(1),  unless the authorization is terminated or revoked.     Resp Syncytial Virus by PCR NEGATIVE NEGATIVE Final    Comment: (NOTE) Fact Sheet for Patients: bloggercourse.com  Fact Sheet for Healthcare Providers: seriousbroker.it  This test is not yet approved or cleared by the [place]  FDA and has been authorized for detection and/or diagnosis of SARS-CoV-2 by FDA under an Emergency Use Authorization (EUA). This EUA will remain in effect (meaning this test can be used) for the duration of the COVID-19 declaration under Section 564(b)(1) of the Act, 21 U.S.C. section 360bbb-3(b)(1), unless the authorization is terminated or revoked.  Performed at [facility], [address] [place], [place] [address]     Labs: CBC: Recent Labs  Lab 11/08/24 1206 11/09/24 0409  WBC 10.4 8.7  HGB 15.4* 16.2*  HCT 46.8* 48.7*  MCV 95.3 95.7  PLT 370 453*   Basic Metabolic Panel: Recent Labs  Lab 11/08/24 1206 11/09/24 0409 11/10/24 1337  NA 139 140 143  K 2.9* 3.5 3.1*  CL 92* 93* 100  CO2 33* 27 31  GLUCOSE 131* 188* 148*  BUN 22 24* 18  CREATININE 1.45* 1.52* 0.77  CALCIUM  9.9 9.5 9.1  MG 1.6* 2.5*  --   PHOS 3.9  --   --    Liver Function Tests: Recent Labs  Lab 11/08/24 1206 11/09/24 0409  AST 39 42*  ALT 27 30  ALKPHOS 73 83  BILITOT 0.7 0.7  PROT 7.1 7.6  ALBUMIN 4.1 4.4   CBG: Recent Labs  Lab 11/08/24 1203 11/08/24 1830  GLUCAP 127* 134*    Discharge time spent: greater than 30 minutes.  Signed: Leita Blanch, MD [facility] 11/12/2024 "

## 2024-11-12 NOTE — Plan of Care (Signed)
   Problem: Education: Goal: Knowledge of General Education information will improve Description: Including pain rating scale, medication(s)/side effects and non-pharmacologic comfort measures Outcome: Adequate for Discharge   Problem: Health Behavior/Discharge Planning: Goal: Ability to manage health-related needs will improve Outcome: Adequate for Discharge   Problem: Clinical Measurements: Goal: Ability to maintain clinical measurements within normal limits will improve Outcome: Adequate for Discharge Goal: Will remain free from infection Outcome: Adequate for Discharge Goal: Diagnostic test results will improve Outcome: Adequate for Discharge Goal: Respiratory complications will improve Outcome: Adequate for Discharge Goal: Cardiovascular complication will be avoided Outcome: Adequate for Discharge   Problem: Activity: Goal: Risk for activity intolerance will decrease Outcome: Adequate for Discharge   Problem: Nutrition: Goal: Adequate nutrition will be maintained Outcome: Adequate for Discharge   Problem: Coping: Goal: Level of anxiety will decrease Outcome: Adequate for Discharge   Problem: Elimination: Goal: Will not experience complications related to bowel motility Outcome: Adequate for Discharge Goal: Will not experience complications related to urinary retention Outcome: Adequate for Discharge   Problem: Pain Managment: Goal: General experience of comfort will improve and/or be controlled Outcome: Adequate for Discharge   Problem: Safety: Goal: Ability to remain free from injury will improve Outcome: Adequate for Discharge   Problem: Skin Integrity: Goal: Risk for impaired skin integrity will decrease Outcome: Adequate for Discharge   Problem: Education: Goal: Knowledge of disease or condition will improve Outcome: Adequate for Discharge Goal: Knowledge of the prescribed therapeutic regimen will improve Outcome: Adequate for Discharge Goal:  Individualized Educational Video(s) Outcome: Adequate for Discharge   Problem: Activity: Goal: Ability to tolerate increased activity will improve Outcome: Adequate for Discharge Goal: Will verbalize the importance of balancing activity with adequate rest periods Outcome: Adequate for Discharge   Problem: Respiratory: Goal: Ability to maintain a clear airway will improve Outcome: Adequate for Discharge Goal: Levels of oxygenation will improve Outcome: Adequate for Discharge Goal: Ability to maintain adequate ventilation will improve Outcome: Adequate for Discharge   Problem: Activity: Goal: Ability to tolerate increased activity will improve Outcome: Adequate for Discharge   Problem: Clinical Measurements: Goal: Ability to maintain a body temperature in the normal range will improve Outcome: Adequate for Discharge   Problem: Respiratory: Goal: Ability to maintain adequate ventilation will improve Outcome: Adequate for Discharge Goal: Ability to maintain a clear airway will improve Outcome: Adequate for Discharge

## 2024-11-12 NOTE — Plan of Care (Signed)

## 2024-11-13 ENCOUNTER — Other Ambulatory Visit: Admission: RE | Admit: 2024-11-13 | Discharge: 2024-11-13 | Disposition: A | Source: Ambulatory Visit

## 2024-11-13 LAB — AMMONIA: Ammonia: 59 umol/L — ABNORMAL HIGH (ref 9–35)

## 2025-05-11 ENCOUNTER — Ambulatory Visit: Admitting: Urology
